# Patient Record
Sex: Male | Born: 1955 | Race: White | Hispanic: No | Smoking: Current every day smoker
Health system: Southern US, Community
[De-identification: ages and names within clinical notes are randomized; demographics above are authoritative.]

## PROBLEM LIST (undated history)

## (undated) DIAGNOSIS — Z9359 Other cystostomy status: Secondary | ICD-10-CM

## (undated) DIAGNOSIS — IMO0001 Reserved for inherently not codable concepts without codable children: Secondary | ICD-10-CM

## (undated) DIAGNOSIS — I10 Essential (primary) hypertension: Secondary | ICD-10-CM

## (undated) DIAGNOSIS — R339 Retention of urine, unspecified: Secondary | ICD-10-CM

## (undated) DIAGNOSIS — C801 Malignant (primary) neoplasm, unspecified: Secondary | ICD-10-CM

## (undated) DIAGNOSIS — Z87442 Personal history of urinary calculi: Secondary | ICD-10-CM

## (undated) DIAGNOSIS — C189 Malignant neoplasm of colon, unspecified: Secondary | ICD-10-CM

## (undated) DIAGNOSIS — C61 Malignant neoplasm of prostate: Secondary | ICD-10-CM

## (undated) DIAGNOSIS — Z9049 Acquired absence of other specified parts of digestive tract: Secondary | ICD-10-CM

## (undated) HISTORY — PX: COLON SURGERY: SHX602

## (undated) HISTORY — PX: SUPRAPUBIC CATHETER INSERTION: SUR719

## (undated) HISTORY — PX: ABDOMINAL SURGERY: SHX537

---

## 2005-10-20 ENCOUNTER — Emergency Department: Payer: Self-pay | Admitting: Emergency Medicine

## 2005-10-20 ENCOUNTER — Other Ambulatory Visit: Payer: Self-pay

## 2006-12-20 ENCOUNTER — Emergency Department: Payer: Self-pay | Admitting: Emergency Medicine

## 2007-07-22 ENCOUNTER — Emergency Department: Payer: Self-pay | Admitting: Emergency Medicine

## 2007-07-22 ENCOUNTER — Other Ambulatory Visit: Payer: Self-pay

## 2008-07-06 ENCOUNTER — Emergency Department: Payer: Self-pay | Admitting: Unknown Physician Specialty

## 2011-01-18 ENCOUNTER — Emergency Department: Payer: Self-pay | Admitting: Emergency Medicine

## 2011-09-23 ENCOUNTER — Emergency Department: Payer: Self-pay | Admitting: *Deleted

## 2011-11-04 ENCOUNTER — Emergency Department: Payer: Self-pay | Admitting: Internal Medicine

## 2011-11-04 LAB — URINALYSIS, COMPLETE
Bacteria: NONE SEEN
Bilirubin,UR: NEGATIVE
Blood: NEGATIVE
Glucose,UR: NEGATIVE mg/dL (ref 0–75)
Hyaline Cast: 3
Ketone: NEGATIVE
Leukocyte Esterase: NEGATIVE
Ph: 6 (ref 4.5–8.0)
RBC,UR: NONE SEEN /HPF (ref 0–5)
Squamous Epithelial: NONE SEEN
WBC UR: 1 /HPF (ref 0–5)

## 2012-10-08 ENCOUNTER — Emergency Department: Payer: Self-pay | Admitting: Emergency Medicine

## 2012-10-08 LAB — URINALYSIS, COMPLETE
Bacteria: NONE SEEN
Blood: NEGATIVE
Glucose,UR: NEGATIVE mg/dL (ref 0–75)
Ketone: NEGATIVE
Ph: 5 (ref 4.5–8.0)
Protein: NEGATIVE
RBC,UR: NONE SEEN /HPF (ref 0–5)
Squamous Epithelial: <1

## 2012-10-08 LAB — BASIC METABOLIC PANEL
Anion Gap: 11 (ref 7–16)
BUN: 6 mg/dL — ABNORMAL LOW (ref 7–18)
Calcium, Total: 8.9 mg/dL (ref 8.5–10.1)
Chloride: 111 mmol/L — ABNORMAL HIGH (ref 98–107)
Creatinine: 0.72 mg/dL (ref 0.60–1.30)
EGFR (African American): >60
EGFR (Non-African Amer.): >60
Osmolality: 288 (ref 275–301)
Sodium: 146 mmol/L — ABNORMAL HIGH (ref 136–145)

## 2012-10-08 LAB — TROPONIN I
Troponin-I: <0.02 ng/mL
Troponin-I: <0.02 ng/mL

## 2012-10-08 LAB — CBC
HGB: 15.9 g/dL (ref 13.0–18.0)
MCH: 35.2 pg — ABNORMAL HIGH (ref 26.0–34.0)
MCHC: 34.5 g/dL (ref 32.0–36.0)

## 2012-10-08 LAB — DRUG SCREEN, URINE
Cannabinoid 50 Ng, Ur ~~LOC~~: NEGATIVE (ref ?–50)
MDMA (Ecstasy)Ur Screen: NEGATIVE (ref ?–500)
Phencyclidine (PCP) Ur S: NEGATIVE (ref ?–25)
Tricyclic, Ur Screen: NEGATIVE (ref ?–1000)

## 2012-10-08 LAB — ETHANOL
Ethanol %: 0.259 % — ABNORMAL HIGH (ref 0.000–0.080)
Ethanol: 259 mg/dL

## 2013-04-27 ENCOUNTER — Emergency Department: Payer: Self-pay | Admitting: Emergency Medicine

## 2013-04-27 LAB — URINALYSIS, COMPLETE
Bacteria: NONE SEEN
Bilirubin,UR: NEGATIVE
Glucose,UR: NEGATIVE mg/dL (ref 0–75)
KETONE: NEGATIVE
LEUKOCYTE ESTERASE: NEGATIVE
NITRITE: NEGATIVE
Ph: 5 (ref 4.5–8.0)
Protein: NEGATIVE
SPECIFIC GRAVITY: 1.009 (ref 1.003–1.030)
Squamous Epithelial: NONE SEEN
WBC UR: 1 /HPF (ref 0–5)

## 2013-04-27 LAB — BASIC METABOLIC PANEL
ANION GAP: 12 (ref 7–16)
BUN: 18 mg/dL (ref 7–18)
Calcium, Total: 9.6 mg/dL (ref 8.5–10.1)
Chloride: 106 mmol/L (ref 98–107)
Co2: 21 mmol/L (ref 21–32)
Creatinine: 1.11 mg/dL (ref 0.60–1.30)
EGFR (African American): >60
EGFR (Non-African Amer.): >60
GLUCOSE: 104 mg/dL — AB (ref 65–99)
Osmolality: 280 (ref 275–301)
POTASSIUM: 4.1 mmol/L (ref 3.5–5.1)
Sodium: 139 mmol/L (ref 136–145)

## 2013-04-29 ENCOUNTER — Emergency Department: Payer: Self-pay | Admitting: Emergency Medicine

## 2013-04-29 LAB — URINE CULTURE

## 2013-05-18 ENCOUNTER — Ambulatory Visit: Payer: Self-pay | Admitting: Urology

## 2013-05-18 LAB — PROTIME-INR
INR: 1
PROTHROMBIN TIME: 13.1 s (ref 11.5–14.7)

## 2013-05-18 LAB — CBC WITH DIFFERENTIAL/PLATELET
BASOS PCT: 1.5 %
Basophil #: 0.1 10*3/uL (ref 0.0–0.1)
EOS ABS: 0.2 10*3/uL (ref 0.0–0.7)
EOS PCT: 3.5 %
HCT: 41.8 % (ref 40.0–52.0)
HGB: 14.2 g/dL (ref 13.0–18.0)
Lymphocyte #: 1.5 10*3/uL (ref 1.0–3.6)
Lymphocyte %: 25.5 %
MCH: 36.6 pg — AB (ref 26.0–34.0)
MCHC: 34.1 g/dL (ref 32.0–36.0)
MCV: 107 fL — ABNORMAL HIGH (ref 80–100)
Monocyte #: 0.5 x10 3/mm (ref 0.2–1.0)
Monocyte %: 8.9 %
NEUTROS ABS: 3.6 10*3/uL (ref 1.4–6.5)
NEUTROS PCT: 60.6 %
Platelet: 139 10*3/uL — ABNORMAL LOW (ref 150–440)
RBC: 3.89 10*6/uL — ABNORMAL LOW (ref 4.40–5.90)
RDW: 12.9 % (ref 11.5–14.5)
WBC: 5.9 10*3/uL (ref 3.8–10.6)

## 2013-05-18 LAB — APTT: Activated PTT: 34.3 secs (ref 23.6–35.9)

## 2013-07-14 ENCOUNTER — Ambulatory Visit: Payer: Self-pay | Admitting: Urology

## 2013-07-22 DIAGNOSIS — N138 Other obstructive and reflux uropathy: Secondary | ICD-10-CM | POA: Insufficient documentation

## 2013-07-22 DIAGNOSIS — N401 Enlarged prostate with lower urinary tract symptoms: Secondary | ICD-10-CM

## 2013-07-24 ENCOUNTER — Ambulatory Visit: Payer: Self-pay | Admitting: Urology

## 2013-07-26 ENCOUNTER — Ambulatory Visit: Payer: Self-pay | Admitting: Urology

## 2013-09-15 DIAGNOSIS — R102 Pelvic and perineal pain: Secondary | ICD-10-CM | POA: Insufficient documentation

## 2013-09-15 DIAGNOSIS — I878 Other specified disorders of veins: Secondary | ICD-10-CM | POA: Insufficient documentation

## 2013-09-15 DIAGNOSIS — R600 Localized edema: Secondary | ICD-10-CM | POA: Insufficient documentation

## 2013-11-01 ENCOUNTER — Other Ambulatory Visit: Payer: Self-pay

## 2013-11-01 LAB — COMPREHENSIVE METABOLIC PANEL
Albumin: 3.9 g/dL (ref 3.4–5.0)
Alkaline Phosphatase: 84 U/L
Anion Gap: 7 (ref 7–16)
BUN: 11 mg/dL (ref 7–18)
Bilirubin,Total: 0.6 mg/dL (ref 0.2–1.0)
CREATININE: 0.85 mg/dL (ref 0.60–1.30)
Calcium, Total: 9.6 mg/dL (ref 8.5–10.1)
Chloride: 103 mmol/L (ref 98–107)
Co2: 29 mmol/L (ref 21–32)
EGFR (African American): >60
EGFR (Non-African Amer.): >60
Glucose: 76 mg/dL (ref 65–99)
Osmolality: 276 (ref 275–301)
Potassium: 4 mmol/L (ref 3.5–5.1)
SGOT(AST): 24 U/L (ref 15–37)
SGPT (ALT): 24 U/L (ref 12–78)
Sodium: 139 mmol/L (ref 136–145)
TOTAL PROTEIN: 7.5 g/dL (ref 6.4–8.2)

## 2014-04-15 ENCOUNTER — Emergency Department: Payer: Self-pay | Admitting: Emergency Medicine

## 2014-04-15 LAB — URINALYSIS, COMPLETE
BILIRUBIN, UR: NEGATIVE
Blood: NEGATIVE
GLUCOSE, UR: NEGATIVE mg/dL (ref 0–75)
Ketone: NEGATIVE
LEUKOCYTE ESTERASE: NEGATIVE
NITRITE: NEGATIVE
PROTEIN: NEGATIVE
Ph: 6 (ref 4.5–8.0)
SPECIFIC GRAVITY: 1.002 (ref 1.003–1.030)
WBC UR: <1 /HPF (ref 0–5)

## 2014-05-10 ENCOUNTER — Ambulatory Visit: Payer: Self-pay | Admitting: Physician Assistant

## 2014-05-31 ENCOUNTER — Ambulatory Visit: Payer: Self-pay | Admitting: Hematology and Oncology

## 2014-06-05 ENCOUNTER — Ambulatory Visit: Payer: Self-pay | Admitting: Hematology and Oncology

## 2014-06-26 ENCOUNTER — Ambulatory Visit
Admit: 2014-06-26 | Disposition: A | Discharge status: Home / Self Care | Payer: Self-pay | Attending: Hematology and Oncology | Admitting: Hematology and Oncology

## 2014-08-18 NOTE — Op Note (Signed)
PATIENT NAME:  Gerald Odonnell, Gerald Odonnell MR#:  [id number] DATE OF BIRTH:  10/18/1954  DATE OF PROCEDURE:  07/26/2013  PREOPERATIVE DIAGNOSES:  1. Urinary retention. 2. Bladder outlet obstruction.   POSTOPERATIVE DIAGNOSES:  1. Urinary retention. 2. Bladder outlet obstruction.   PROCEDURE: Photoselective vaporization of the prostate with GreenLight laser.   SURGEON: [place] Urieta C. Bernardo Heater, MD  ASSISTANT: None.  ANESTHETIC: General.  INDICATIONS: A 59 year old male who presented approximately 2 months ago with urinary retention. Upon catheter placement, initially there was approximately 1900 mL of urine in the bladder. He failed a voiding trial with greater than 1 liter of urine in the bladder. He was unable to tolerate Foley catheter drainage secondary to pain and was able to perform intermittent catheterization. A suprapubic tube was subsequently placed. The urodynamic study was felt consistent with obstruction. Cystoscopy in the office showed bladder neck elevation and mild-to-moderate lateral lobe enlargement. After discussion of options, he has elected photoselective vaporization.   DESCRIPTION OF PROCEDURE: The patient was taken to the operating room and placed on the table in the supine position. A general anesthetic was administered via an LMA. He was then placed in the low lithotomy position and his external genitalia were prepped and draped in the usual fashion. A timeout was performed per protocol. A 21 French laser cystoscope was lubricated and passed under direct vision. The urethra was normal in caliber without stricture. Prostate was examined. There was moderate bladder neck elevation. There was mild lateral lobe enlargement present. The bladder mucosa was closely inspected and there were no solid or papillary lesions identified. The suprapubic tube was in good position. A GreenLight laser fiber was placed through the cystoscope. Starting at a power of 80 watts, grooves were vaporized at the 5 and  7 o'clock position starting at the bladder neck and working towards the verumontanum. The intervening median tissue was then partially vaporized and then enucleated. Hemostasis was obtained with laser coagulation. The lateral lobes were then vaporized from bladder neck to the verumontanum. At the completion of the procedure, with the scope positioned at the verumontanum, the channel was wide open. No significant bleeding was noted. The small amount of enucleated tissue was removed from the bladder via irrigation. A 20 French Foley catheter was placed with return of pink-tinged effluent upon irrigation. The catheter was placed to gravity drainage. A B&O suppository was placed per rectum.  He was taken to the PACU in stable condition. There were no complications.  ESTIMATED BLOOD LOSS: Minimal.    ____________________________ Ronda Fairly. Bernardo Heater, MD scs:lt D: 07/28/2013 18:26:28 ET T: 07/28/2013 23:39:22 ET JOB#: [id number]  cc: Nicki Reaper C. Bernardo Heater, MD, <Dictator> Abbie Sons MD ELECTRONICALLY SIGNED 08/02/2013 13:59

## 2014-09-10 DIAGNOSIS — R339 Retention of urine, unspecified: Secondary | ICD-10-CM | POA: Insufficient documentation

## 2014-09-22 ENCOUNTER — Emergency Department
Admission: EM | Admit: 2014-09-22 | Discharge: 2014-09-22 | Disposition: A | Discharge status: Home / Self Care | Payer: Self-pay | Attending: Emergency Medicine | Admitting: Emergency Medicine

## 2014-09-22 ENCOUNTER — Encounter: Payer: Self-pay | Admitting: Emergency Medicine

## 2014-09-22 DIAGNOSIS — Z9889 Other specified postprocedural states: Secondary | ICD-10-CM | POA: Insufficient documentation

## 2014-09-22 DIAGNOSIS — R338 Other retention of urine: Secondary | ICD-10-CM

## 2014-09-22 DIAGNOSIS — Z466 Encounter for fitting and adjustment of urinary device: Secondary | ICD-10-CM | POA: Insufficient documentation

## 2014-09-22 DIAGNOSIS — R1030 Lower abdominal pain, unspecified: Secondary | ICD-10-CM | POA: Insufficient documentation

## 2014-09-22 DIAGNOSIS — Z720 Tobacco use: Secondary | ICD-10-CM | POA: Insufficient documentation

## 2014-09-22 DIAGNOSIS — R339 Retention of urine, unspecified: Secondary | ICD-10-CM | POA: Insufficient documentation

## 2014-09-22 LAB — URINALYSIS COMPLETE WITH MICROSCOPIC (ARMC ONLY)
BILIRUBIN URINE: NEGATIVE
Bacteria, UA: NONE SEEN
Glucose, UA: NEGATIVE mg/dL
Hgb urine dipstick: NEGATIVE
KETONES UR: NEGATIVE mg/dL
LEUKOCYTES UA: NEGATIVE
Nitrite: NEGATIVE
Protein, ur: NEGATIVE mg/dL
Specific Gravity, Urine: 1.003 — ABNORMAL LOW (ref 1.005–1.030)
Squamous Epithelial / LPF: NONE SEEN
pH: 5 (ref 5.0–8.0)

## 2014-09-22 MED ORDER — TRAMADOL HCL 50 MG PO TABS
100.0000 mg | ORAL_TABLET | Freq: Once | ORAL | Status: AC
  Administered 2014-09-22: 100 mg via ORAL

## 2014-09-22 MED ORDER — TRAMADOL HCL 50 MG PO TABS
ORAL_TABLET | ORAL | Status: AC
  Filled 2014-09-22: qty 2

## 2014-09-22 NOTE — Discharge Instructions (Signed)
Acute Urinary Retention °Acute urinary retention is the temporary inability to urinate. °This is a common problem in older men. As men age their prostates become larger and block the flow of urine from the bladder. This is usually a problem that has come on gradually.  °HOME CARE INSTRUCTIONS °If you are sent home with a Foley catheter and a drainage system, you will need to discuss the best course of action with your health care provider. While the catheter is in, maintain a good intake of fluids. Keep the drainage bag emptied and lower than your catheter. This is so that contaminated urine will not flow back into your bladder, which could lead to a urinary tract infection. °There are two main types of drainage bags. One is a large bag that usually is used at night. It has a good capacity that will allow you to sleep through the night without having to empty it. The second type is called a leg bag. It has a smaller capacity, so it needs to be emptied more frequently. However, the main advantage is that it can be attached by a leg strap and can go underneath your clothing, allowing you the freedom to move about or leave your home. °Only take over-the-counter or prescription medicines for pain, discomfort, or fever as directed by your health care provider.  °SEEK MEDICAL CARE IF: °· You develop a low-grade fever. °· You experience spasms or leakage of urine with the spasms. °SEEK IMMEDIATE MEDICAL CARE IF:  °· You develop chills or fever. °· Your catheter stops draining urine. °· Your catheter falls out. °· You start to develop increased bleeding that does not respond to rest and increased fluid intake. °MAKE SURE YOU: °· Understand these instructions. °· Will watch your condition. °· Will get help right away if you are not doing well or get worse. °Document Released: 07/20/2000 Document Revised: 04/18/2013 Document Reviewed: 09/22/2012 °[employer]® Patient Information ©2015 [employer]. This information is not  intended to replace advice given to you by your health care provider. Make sure you discuss any questions you have with your health care provider. ° °

## 2014-09-22 NOTE — ED Provider Notes (Signed)
[facility] Emergency Department Provider Note  Time seen: 7:50 AM  I have reviewed the triage vital signs and the nursing notes.   HISTORY  Chief Complaint Urinary Retention    HPI Gerald Odonnell is a 59 y.o. male with a past medical history of BPH, status post prostate surgery 1.5 years ago, presents to the emergency department with lower abdominal pain and being unable to urinate 6 hours. According to the patient he was able to urinate some overnight but approximate 6 hours ago as a last time he was able to do so. Since that time he has had worsening pain and lower abdominal distention. Patient describes his pain as moderate/severe in severity, dull/aching in the lower abdomen. Worse with moving.Denies any fever, recent dysuria or hematuria.    History reviewed. No pertinent past medical history.  There are no active problems to display for this patient.   Past Surgical History  Procedure Laterality Date  . Abdominal surgery      No current outpatient prescriptions on file.  Allergies Review of patient's allergies indicates no known allergies.  History reviewed. No pertinent family history.  Social History History  Substance Use Topics  . Smoking status: Current Every Day Smoker -- 0.50 packs/day    Types: Cigarettes  . Smokeless tobacco: Not on file  . Alcohol Use: Not on file    Review of Systems Constitutional: Negative for fever. Cardiovascular: Negative for chest pain. Respiratory: Negative for shortness of breath. Gastrointestinal: Lower abdominal pain, nausea. Genitourinary: Negative for dysuria. Positive for urinary retention. Musculoskeletal: Negative for back pain.  10-point ROS otherwise negative.  ____________________________________________   PHYSICAL EXAM:  VITAL SIGNS: ED Triage Vitals  Enc Vitals Group     BP 09/22/14 0722 112/69 mmHg     Pulse Rate 09/22/14 0722 94     Resp 09/22/14 0722 18     Temp  09/22/14 0722 98.3 F (36.8 C)     Temp Source 09/22/14 0722 Oral     SpO2 09/22/14 0722 99 %     Weight 09/22/14 0717 248 lb (112.492 kg)     Height 09/22/14 0717 5\' 6"  (1.676 m)     Head Cir --      Peak Flow --      Pain Score 09/22/14 0717 10     Pain Loc --      Pain Edu? --      Excl. in [place]? --     Constitutional: Alert and oriented. Well appearing and in no distress. ENT   Mouth/Throat: Mucous membranes are moist. Cardiovascular: Normal rate, regular rhythm. No murmurs, rubs, or gallops. Respiratory: Normal respiratory effort without tachypnea nor retractions. Breath sounds are clear  Gastrointestinal: Soft, minimal tenderness to palpation in the suprapubic area (status post Foley insertion). No rebound, no guarding. Genitourinary: Foley catheter now in place. Musculoskeletal: Nontender with normal range of motion in all extremities.  Neurologic:  Normal speech and language. No gross focal neurologic deficits Skin:  Skin is warm, dry and intact.  Psychiatric: Mood and affect are normal. Speech and behavior are normal.  ____________________________________________   INITIAL IMPRESSION / ASSESSMENT AND PLAN / ED COURSE  Pertinent labs & imaging results that were available during my care of the patient were reviewed by me and considered in my medical decision making (see chart for details).  Patient with acute urinary retention. Foley catheter placed with 1300 cc of urine relieved. We will check a urinalysis, transition the patient to  leg bag, and have him follow-up with his urologist Dr. Bernardo Heater.  Patient just saw his urologist approximate 2 weeks ago for a follow-up visit.   Urinalysis within normal limits. We will discharge home with urology follow-up in 2 weeks. ____________________________________________   FINAL CLINICAL IMPRESSION(S) / ED DIAGNOSES  Acute urinary retention   Harvest Dark, MD 09/22/14 [phone]

## 2014-09-22 NOTE — ED Notes (Addendum)
Pt's foley catheter changed to leg bag. Demonstrated used of leg bag. Pt verbalized understanding of leg bag use.

## 2014-09-22 NOTE — ED Notes (Signed)
Last urinated approx 4-5 hours ago , pt pacing back and forth, HX enlarged prostate

## 2015-01-07 ENCOUNTER — Encounter: Payer: Self-pay | Admitting: Emergency Medicine

## 2015-01-07 DIAGNOSIS — Z720 Tobacco use: Secondary | ICD-10-CM | POA: Insufficient documentation

## 2015-01-07 DIAGNOSIS — R42 Dizziness and giddiness: Secondary | ICD-10-CM | POA: Insufficient documentation

## 2015-01-07 DIAGNOSIS — R1084 Generalized abdominal pain: Secondary | ICD-10-CM | POA: Insufficient documentation

## 2015-01-07 DIAGNOSIS — Z79899 Other long term (current) drug therapy: Secondary | ICD-10-CM | POA: Insufficient documentation

## 2015-01-07 DIAGNOSIS — R000 Tachycardia, unspecified: Secondary | ICD-10-CM | POA: Insufficient documentation

## 2015-01-07 DIAGNOSIS — R112 Nausea with vomiting, unspecified: Secondary | ICD-10-CM | POA: Insufficient documentation

## 2015-01-07 DIAGNOSIS — K5900 Constipation, unspecified: Secondary | ICD-10-CM | POA: Insufficient documentation

## 2015-01-07 DIAGNOSIS — R339 Retention of urine, unspecified: Secondary | ICD-10-CM | POA: Insufficient documentation

## 2015-01-07 NOTE — ED Notes (Signed)
Pt to triage via w/c with no distress noted; st constipation x 2 days; lower abd pain with vomiting

## 2015-01-08 ENCOUNTER — Emergency Department: Payer: Self-pay

## 2015-01-08 ENCOUNTER — Emergency Department
Admission: EM | Admit: 2015-01-08 | Discharge: 2015-01-08 | Disposition: A | Discharge status: Home / Self Care | Payer: Self-pay | Attending: Emergency Medicine | Admitting: Emergency Medicine

## 2015-01-08 ENCOUNTER — Encounter: Payer: Self-pay | Admitting: *Deleted

## 2015-01-08 DIAGNOSIS — R339 Retention of urine, unspecified: Secondary | ICD-10-CM

## 2015-01-08 DIAGNOSIS — R1084 Generalized abdominal pain: Secondary | ICD-10-CM

## 2015-01-08 DIAGNOSIS — K5900 Constipation, unspecified: Secondary | ICD-10-CM

## 2015-01-08 DIAGNOSIS — R109 Unspecified abdominal pain: Secondary | ICD-10-CM

## 2015-01-08 HISTORY — DX: Malignant (primary) neoplasm, unspecified: C80.1

## 2015-01-08 LAB — URINALYSIS COMPLETE WITH MICROSCOPIC (ARMC ONLY)
BILIRUBIN URINE: NEGATIVE
Bacteria, UA: NONE SEEN
Glucose, UA: NEGATIVE mg/dL
Ketones, ur: NEGATIVE mg/dL
Nitrite: NEGATIVE
Protein, ur: NEGATIVE mg/dL
SPECIFIC GRAVITY, URINE: 1.013 (ref 1.005–1.030)
pH: 5 (ref 5.0–8.0)

## 2015-01-08 LAB — COMPREHENSIVE METABOLIC PANEL
ALT: 25 U/L (ref 17–63)
AST: 37 U/L (ref 15–41)
Albumin: 4.3 g/dL (ref 3.5–5.0)
Alkaline Phosphatase: 84 U/L (ref 38–126)
Anion gap: 12 (ref 5–15)
BILIRUBIN TOTAL: 2.6 mg/dL — AB (ref 0.3–1.2)
BUN: 42 mg/dL — ABNORMAL HIGH (ref 6–20)
CHLORIDE: 99 mmol/L — AB (ref 101–111)
CO2: 25 mmol/L (ref 22–32)
CREATININE: 2.16 mg/dL — AB (ref 0.61–1.24)
Calcium: 9.3 mg/dL (ref 8.9–10.3)
GFR, EST AFRICAN AMERICAN: 36 mL/min — AB (ref >60)
GFR, EST NON AFRICAN AMERICAN: 31 mL/min — AB (ref >60)
Glucose, Bld: 123 mg/dL — ABNORMAL HIGH (ref 65–99)
POTASSIUM: 3.8 mmol/L (ref 3.5–5.1)
Sodium: 136 mmol/L (ref 135–145)
TOTAL PROTEIN: 7.8 g/dL (ref 6.5–8.1)

## 2015-01-08 LAB — LIPASE, BLOOD: LIPASE: 17 U/L — AB (ref 22–51)

## 2015-01-08 LAB — CBC
HCT: 49.6 % (ref 40.0–52.0)
Hemoglobin: 16.9 g/dL (ref 13.0–18.0)
MCH: 34.5 pg — ABNORMAL HIGH (ref 26.0–34.0)
MCHC: 34 g/dL (ref 32.0–36.0)
MCV: 101.6 fL — AB (ref 80.0–100.0)
PLATELETS: 144 10*3/uL — AB (ref 150–440)
RBC: 4.88 MIL/uL (ref 4.40–5.90)
RDW: 13.7 % (ref 11.5–14.5)
WBC: 26.5 10*3/uL — AB (ref 3.8–10.6)

## 2015-01-08 LAB — TROPONIN I: Troponin I: <0.03 ng/mL (ref <0.031)

## 2015-01-08 MED ORDER — OXYBUTYNIN CHLORIDE ER 10 MG PO TB24: 10.0000 mg | ORAL_TABLET | Freq: Every day | ORAL | Status: DC

## 2015-01-08 MED ORDER — MORPHINE SULFATE (PF) 4 MG/ML IV SOLN
INTRAVENOUS | Status: AC
  Filled 2015-01-08: qty 1

## 2015-01-08 MED ORDER — POLYETHYLENE GLYCOL 3350 17 G PO PACK: 17.0000 g | PACK | Freq: Two times a day (BID) | ORAL | Status: DC

## 2015-01-08 MED ORDER — SODIUM CHLORIDE 0.9 % IV BOLUS (SEPSIS)
1000.0000 mL | Freq: Once | INTRAVENOUS | Status: AC
  Administered 2015-01-08: 1000 mL via INTRAVENOUS

## 2015-01-08 MED ORDER — MORPHINE SULFATE (PF) 4 MG/ML IV SOLN
4.0000 mg | Freq: Once | INTRAVENOUS | Status: AC
  Administered 2015-01-08: 4 mg via INTRAVENOUS

## 2015-01-08 MED ORDER — IOHEXOL 240 MG/ML SOLN
25.0000 mL | INTRAMUSCULAR | Status: AC
  Administered 2015-01-08: 25 mL via ORAL

## 2015-01-08 MED ORDER — ONDANSETRON HCL 4 MG/2ML IJ SOLN
4.0000 mg | Freq: Once | INTRAMUSCULAR | Status: AC
  Administered 2015-01-08: 4 mg via INTRAVENOUS
  Filled 2015-01-08: qty 2

## 2015-01-08 MED ORDER — CEPHALEXIN 500 MG PO CAPS: 500.0000 mg | ORAL_CAPSULE | Freq: Four times a day (QID) | ORAL | Status: AC

## 2015-01-08 MED ORDER — MORPHINE SULFATE (PF) 4 MG/ML IV SOLN
4.0000 mg | Freq: Once | INTRAVENOUS | Status: AC
  Administered 2015-01-08: 4 mg via INTRAVENOUS
  Filled 2015-01-08: qty 1

## 2015-01-08 NOTE — ED Provider Notes (Signed)
[facility] Emergency Department Provider Note  ____________________________________________  Time seen: Approximately 216 AM  I have reviewed the triage vital signs and the nursing notes.   HISTORY  Chief Complaint Constipation and Abdominal Pain    HPI Gerald Odonnell is a 59 y.o. male who comes into the hospital today with abdominal pain and constipation. The patient reports he has not been to the bathroom since Saturday and he had been straining. He reports that he's been having bad pains in his rectum and his abdomen. The patient took the medicine for constipation that he does not remember the name but reports did not help. He reports the stomach feels tight. The patient has had problems with his prostate in the past in his wife feels as though his abdomen is full of fluid. The patient has not tried any enemas at home. The patient reports that the pain was intense and he decided to come in today to have it evaluated. The patient's pain is a 10 out of 10 in intensity. He also reports that he has some chest pain and shortness of breath 2 days ago with some sweats and dizziness. The patient reports that he is unable to tolerate the abdominal pain anymore so he is here for further evaluation.   Past Medical History  Diagnosis Date  . Cancer     colon    There are no active problems to display for this patient.   Past Surgical History  Procedure Laterality Date  . Abdominal surgery      Current Outpatient Rx  Name  Route  Sig  Dispense  Refill  . cephALEXin (KEFLEX) 500 MG capsule   Oral   Take 1 capsule (500 mg total) by mouth 4 (four) times daily.   20 capsule   0   . oxybutynin (DITROPAN XL) 10 MG 24 hr tablet   Oral   Take 1 tablet (10 mg total) by mouth daily.   30 tablet   2   . polyethylene glycol (MIRALAX / GLYCOLAX) packet   Oral   Take 17 g by mouth 2 (two) times daily.   14 each   0     Allergies Review of patient's allergies  indicates no known allergies.  No family history on file.  Social History Social History  Substance Use Topics  . Smoking status: Current Every Day Smoker -- 0.50 packs/day    Types: Cigarettes  . Smokeless tobacco: None  . Alcohol Use: No    Review of Systems Constitutional: No fever/chills Eyes: No visual changes. ENT: No sore throat. Cardiovascular: chest pain. Respiratory:  shortness of breath. Gastrointestinal: Abdominal pain nausea and vomiting, constipation Genitourinary: Negative for dysuria. Musculoskeletal: Negative for back pain. Skin: Negative for rash. Neurological: Dizziness  10-point ROS otherwise negative.  ____________________________________________   PHYSICAL EXAM:  VITAL SIGNS: ED Triage Vitals  Enc Vitals Group     BP 01/07/15 2352 149/82 mmHg     Pulse Rate 01/07/15 2352 127     Resp 01/07/15 2352 22     Temp 01/07/15 2352 97.7 F (36.5 C)     Temp Source 01/07/15 2352 Oral     SpO2 01/07/15 2352 95 %     Weight 01/07/15 2352 255 lb (115.667 kg)     Height 01/07/15 2352 5\' 11"  (1.803 m)     Head Cir --      Peak Flow --      Pain Score 01/07/15 2352 10  Pain Loc --      Pain Edu? --      Excl. in [place]? --     Constitutional: Alert and oriented. Well appearing and in moderate distress. Eyes: Conjunctivae are normal. PERRL. EOMI. Head: Atraumatic. Nose: No congestion/rhinnorhea. Mouth/Throat: Mucous membranes are moist.  Oropharynx non-erythematous. Cardiovascular: Tachycardic, regular rhythm. Grossly normal heart sounds.  Good peripheral circulation. Respiratory: Normal respiratory effort.  No retractions. Lungs CTAB. Gastrointestinal: Firm and distended with diffuse tenderness to palpation. Positive bowel sounds Musculoskeletal: No lower extremity tenderness nor edema.  Neurologic:  Normal speech and language.  Skin:  Skin is warm, dry and intact. Psychiatric: Mood and affect are normal.    ____________________________________________   LABS (all labs ordered are listed, but only abnormal results are displayed)  Labs Reviewed  LIPASE, BLOOD - Abnormal; Notable for the following:    Lipase 17 (*)    All other components within normal limits  COMPREHENSIVE METABOLIC PANEL - Abnormal; Notable for the following:    Chloride 99 (*)    Glucose, Bld 123 (*)    BUN 42 (*)    Creatinine, Ser 2.16 (*)    Total Bilirubin 2.6 (*)    GFR calc non Af Amer 31 (*)    GFR calc Af Amer 36 (*)    All other components within normal limits  CBC - Abnormal; Notable for the following:    WBC 26.5 (*)    MCV 101.6 (*)    MCH 34.5 (*)    Platelets 144 (*)    All other components within normal limits  URINALYSIS COMPLETEWITH MICROSCOPIC ([facility] ONLY) - Abnormal; Notable for the following:    Color, Urine YELLOW (*)    APPearance HAZY (*)    Hgb urine dipstick 3+ (*)    Leukocytes, UA TRACE (*)    Squamous Epithelial / LPF 0-5 (*)    All other components within normal limits  URINE CULTURE  TROPONIN I   ____________________________________________  EKG  ED ECG REPORT I, Loney Hering, the attending physician, personally viewed and interpreted this ECG.   Date: 01/07/2015  EKG Time: 2357  Rate: 119  Rhythm: sinus tachycardia  Axis: normal  Intervals:none  ST&T Change: none   ED ECG REPORT # 2 I, Loney Hering, the attending physician, personally viewed and interpreted this ECG.   Date: 01/08/2015  EKG Time: 231  Rate: 101  Rhythm: sinus tachycardia  Axis: normal  Intervals:none  ST&T Change: none  ____________________________________________  RADIOLOGY  KUB: Mild gaseous distention of the ascending and transverse colon, no significant bowel dilatation to suggest obstruction, small volume of colonic stool.  CT abdomen and pelvis: Markedly distended urinary bladder with mild resultant hydroureteronephrosis, there is. Vesicular inflammatory change  right greater than left correlation with urinalysis to exclude urinary tract infection, no bowel obstruction, right adrenal nodule measures 1.72.3 cm ____________________________________________   PROCEDURES  Procedure(s) performed: None  Critical Care performed: No  ____________________________________________   INITIAL IMPRESSION / ASSESSMENT AND PLAN / ED COURSE  Pertinent labs & imaging results that were available during my care of the patient were reviewed by me and considered in my medical decision making (see chart for details).  This is a 59 year old male who comes in with abdominal pain and constipation. The patient when I initially evaluated him seemed to be in a lot of discomfort and distress. His abdomen was also very distended and tight. I ordered a CT scan and was found that his  bladder was markedly distended. The patient even after the results of the CT initially was hesitant to have catheter as he reports that he is able to urinate but we convinced him of the need for a catheter and he put out a large amount of urine. Although the patient's urinalysis did not show any significant infection I will place the patient on some Keflex and have her follow-up with Dr. Noland Fordyce off in the office. I feel that the patient's white blood cell count is due to acute stress reaction. The patient's pain improved after the catheter was put in. The patient did receive some morphine and Zofran for pain as well. I will discharge the patient to home and have him follow-up with Dr. Noland Fordyce off his urologist. ____________________________________________   FINAL CLINICAL IMPRESSION(S) / ED DIAGNOSES  Final diagnoses:  Constipation, unspecified constipation type  Generalized abdominal pain  Urinary retention      Loney Hering, MD 01/08/15 [phone]

## 2015-01-08 NOTE — Discharge Instructions (Signed)
Abdominal Pain °Many things can cause abdominal pain. Usually, abdominal pain is not caused by a disease and will improve without treatment. It can often be observed and treated at home. Your health care provider will do a physical exam and possibly order blood tests and X-rays to help determine the seriousness of your pain. However, in many cases, more time must pass before a clear cause of the pain can be found. Before that point, your health care provider may not know if you need more testing or further treatment. °HOME CARE INSTRUCTIONS  °Monitor your abdominal pain for any changes. The following actions may help to alleviate any discomfort you are experiencing: °· Only take over-the-counter or prescription medicines as directed by your health care provider. °· Do not take laxatives unless directed to do so by your health care provider. °· Try a clear liquid diet (broth, tea, or water) as directed by your health care provider. Slowly move to a bland diet as tolerated. °SEEK MEDICAL CARE IF: °· You have unexplained abdominal pain. °· You have abdominal pain associated with nausea or diarrhea. °· You have pain when you urinate or have a bowel movement. °· You experience abdominal pain that wakes you in the night. °· You have abdominal pain that is worsened or improved by eating food. °· You have abdominal pain that is worsened with eating fatty foods. °· You have a fever. °SEEK IMMEDIATE MEDICAL CARE IF:  °· Your pain does not go away within 2 hours. °· You keep throwing up (vomiting). °· Your pain is felt only in portions of the abdomen, such as the right side or the left lower portion of the abdomen. °· You pass bloody or black tarry stools. °MAKE SURE YOU: °· Understand these instructions.   °· Will watch your condition.   °· Will get help right away if you are not doing well or get worse.   °Document Released: 01/21/2005 Document Revised: 04/18/2013 Document Reviewed: 12/21/2012 °[employer]® Patient Information  ©2015 [employer]. This information is not intended to replace advice given to you by your health care provider. Make sure you discuss any questions you have with your health care provider. ° °Constipation °Constipation is when a person has fewer than three bowel movements a week, has difficulty having a bowel movement, or has stools that are dry, hard, or larger than normal. As people grow older, constipation is more common. If you try to fix constipation with medicines that make you have a bowel movement (laxatives), the problem may get worse. Long-term laxative use may cause the muscles of the colon to become weak. A low-fiber diet, not taking in enough fluids, and taking certain medicines may make constipation worse.  °CAUSES  °· Certain medicines, such as antidepressants, pain medicine, iron supplements, antacids, and water pills.   °· Certain diseases, such as diabetes, irritable bowel syndrome (IBS), thyroid disease, or depression.   °· Not drinking enough water.   °· Not eating enough fiber-rich foods.   °· Stress or travel.   °· Lack of physical activity or exercise.   °· Ignoring the urge to have a bowel movement.   °· Using laxatives too much.   °SIGNS AND SYMPTOMS  °· Having fewer than three bowel movements a week.   °· Straining to have a bowel movement.   °· Having stools that are hard, dry, or larger than normal.   °· Feeling full or bloated.   °· Pain in the lower abdomen.   °· Not feeling relief after having a bowel movement.   °DIAGNOSIS  °Your health care provider will take   a medical history and perform a physical exam. Further testing may be done for severe constipation. Some tests may include:  A barium enema X-ray to examine your rectum, colon, and, sometimes, your small intestine.   A sigmoidoscopy to examine your lower colon.   A colonoscopy to examine your entire colon. TREATMENT  Treatment will depend on the severity of your constipation and what is causing it. Some dietary  treatments include drinking more fluids and eating more fiber-rich foods. Lifestyle treatments may include regular exercise. If these diet and lifestyle recommendations do not help, your health care provider may recommend taking over-the-counter laxative medicines to help you have bowel movements. Prescription medicines may be prescribed if over-the-counter medicines do not work.  HOME CARE INSTRUCTIONS   Eat foods that have a lot of fiber, such as fruits, vegetables, whole grains, and beans.  Limit foods high in fat and processed sugars, such as french fries, hamburgers, cookies, candies, and soda.   A fiber supplement may be added to your diet if you cannot get enough fiber from foods.   Drink enough fluids to keep your urine clear or pale yellow.   Exercise regularly or as directed by your health care provider.   Go to the restroom when you have the urge to go. Do not hold it.   Only take over-the-counter or prescription medicines as directed by your health care provider. Do not take other medicines for constipation without talking to your health care provider first.  [place] IF:   You have bright red blood in your stool.   Your constipation lasts for more than 4 days or gets worse.   You have abdominal or rectal pain.   You have thin, pencil-like stools.   You have unexplained weight loss. MAKE SURE YOU:   Understand these instructions.  Will watch your condition.  Will get help right away if you are not doing well or get worse. Document Released: 01/10/2004 Document Revised: 04/18/2013 Document Reviewed: 01/23/2013 [facility] Patient Information 2015 [place], [place]. This information is not intended to replace advice given to you by your health care provider. Make sure you discuss any questions you have with your health care provider.  Acute Urinary Retention Acute urinary retention is when you are unable to pee (urinate). Acute urinary retention  is common in older men. Prostates can get bigger, which blocks the flow of pee.  HOME CARE  Drink enough fluids to keep your pee clear or pale yellow.  If you are sent home with a tube that drains the bladder (catheter), there will be a drainage bag attached to it. There are two types of bags. One is big that you can wear at night without having to empty it. One is smaller and needs to be emptied more often.  Keep the drainage bag empty.  Keep the drainage bag lower than your catheter.  Only take medicine as told by your doctor. GET HELP IF:  You have a low-grade fever.  You have spasms or you are leaking pee when you have spasms. GET HELP RIGHT AWAY IF:   You have chills or a fever.  Your catheter stops draining pee.  Your catheter falls out.  You have increased bleeding that does not stop after you have rested and increased the amount of fluids you had been drinking. MAKE SURE YOU:   Understand these instructions.  Will watch your condition.  Will get help right away if you are not doing well or get  worse. Document Released: 09/30/2007 Document Revised: 02/01/2013 Document Reviewed: 09/22/2012 [facility] Patient Information 2015 [place], [place]. This information is not intended to replace advice given to you by your health care provider. Make sure you discuss any questions you have with your health care provider.

## 2015-01-08 NOTE — ED Notes (Signed)
Pt provided leg drainage bag, D/C instructions and Rx given. Pt with no additional questions at this time, accompanied by family, ambulatory and in no pain.

## 2015-01-08 NOTE — ED Notes (Signed)
Patient reports feeling better following decompression of bladder following foley placement. Patient drained over 2.5L of urine. MD aware. No verbalized needs at this time. Will continue to monitor.

## 2015-01-09 LAB — URINE CULTURE
Culture: NO GROWTH
Special Requests: NORMAL

## 2015-01-17 ENCOUNTER — Encounter: Payer: Self-pay | Admitting: *Deleted

## 2015-01-17 ENCOUNTER — Emergency Department
Admission: EM | Admit: 2015-01-17 | Discharge: 2015-01-17 | Disposition: A | Discharge status: Home / Self Care | Payer: Self-pay | Attending: Emergency Medicine | Admitting: Emergency Medicine

## 2015-01-17 DIAGNOSIS — Z79899 Other long term (current) drug therapy: Secondary | ICD-10-CM | POA: Insufficient documentation

## 2015-01-17 DIAGNOSIS — R339 Retention of urine, unspecified: Secondary | ICD-10-CM | POA: Insufficient documentation

## 2015-01-17 DIAGNOSIS — Z720 Tobacco use: Secondary | ICD-10-CM | POA: Insufficient documentation

## 2015-01-17 HISTORY — DX: Retention of urine, unspecified: R33.9

## 2015-01-17 LAB — URINALYSIS COMPLETE WITH MICROSCOPIC (ARMC ONLY)
BILIRUBIN URINE: NEGATIVE
GLUCOSE, UA: NEGATIVE mg/dL
KETONES UR: NEGATIVE mg/dL
NITRITE: NEGATIVE
PH: 6 (ref 5.0–8.0)
Protein, ur: 30 mg/dL — AB
Specific Gravity, Urine: 1.006 (ref 1.005–1.030)
Squamous Epithelial / LPF: NONE SEEN

## 2015-01-17 MED ORDER — OXYCODONE-ACETAMINOPHEN 5-325 MG PO TABS
2.0000 | ORAL_TABLET | Freq: Once | ORAL | Status: AC
  Administered 2015-01-17: 2 via ORAL
  Filled 2015-01-17: qty 2

## 2015-01-17 MED ORDER — LIDOCAINE HCL 2 % EX GEL
CUTANEOUS | Status: AC
  Filled 2015-01-17: qty 10

## 2015-01-17 MED ORDER — LIDOCAINE HCL 2 % EX GEL
1.0000 "application " | Freq: Once | CUTANEOUS | Status: AC
  Administered 2015-01-17: 1 via URETHRAL

## 2015-01-17 NOTE — ED Provider Notes (Signed)
[facility] Emergency Department Provider Note    ____________________________________________  Time seen: 1800  I have reviewed the triage vital signs and the nursing notes.   HISTORY  Chief Complaint Urinary Retention   History limited by: Not Limited   HPI Gerald Odonnell is a 59 y.o. male who presents to the emergency department today because of lower abdominal pain and concerns for urinary retention. The patient states that he had his urinary catheter removed by his urologist earlier today. He had had it placed for 12 days. Patient states he has a history of prostatic enlargement and has now had to have multiple urinary catheterizations placed secondary to urinary retention. She states he has had procedures to try to strep his prostate. The patient denies any fevers.   Past Medical History  Diagnosis Date  . Cancer     colon  . Urinary retention     There are no active problems to display for this patient.   Past Surgical History  Procedure Laterality Date  . Abdominal surgery      Current Outpatient Rx  Name  Route  Sig  Dispense  Refill  . oxybutynin (DITROPAN XL) 10 MG 24 hr tablet   Oral   Take 1 tablet (10 mg total) by mouth daily.   30 tablet   2   . polyethylene glycol (MIRALAX / GLYCOLAX) packet   Oral   Take 17 g by mouth 2 (two) times daily.   14 each   0     Allergies Review of patient's allergies indicates no known allergies.  No family history on file.  Social History Social History  Substance Use Topics  . Smoking status: Current Every Day Smoker -- 0.50 packs/day    Types: Cigarettes  . Smokeless tobacco: None  . Alcohol Use: No    Review of Systems  Constitutional: Negative for fever. Cardiovascular: Negative for chest pain. Respiratory: Negative for shortness of breath. Gastrointestinal: Positive for lower abdominal pain Genitourinary: Positive for urinary retention Musculoskeletal: Negative for  back pain. Skin: Negative for rash. Neurological: Negative for headaches, focal weakness or numbness.  10-point ROS otherwise negative.  ____________________________________________   PHYSICAL EXAM:  VITAL SIGNS: ED Triage Vitals  Enc Vitals Group     BP 01/17/15 1614 144/96 mmHg     Pulse Rate 01/17/15 1614 109     Resp 01/17/15 1614 22     Temp 01/17/15 1614 98.9 F (37.2 C)     Temp Source 01/17/15 1614 Oral     SpO2 01/17/15 1614 96 %     Weight 01/17/15 1614 245 lb (111.131 kg)     Height --      Head Cir --      Peak Flow --      Pain Score 01/17/15 1617 10   Constitutional: Alert and oriented. Well appearing and in no distress. Eyes: Conjunctivae are normal. PERRL. Normal extraocular movements. ENT   Head: Normocephalic and atraumatic.   Nose: No congestion/rhinnorhea.   Mouth/Throat: Mucous membranes are moist.   Neck: No stridor. Hematological/Lymphatic/Immunilogical: No cervical lymphadenopathy. Cardiovascular: Normal rate, regular rhythm.  No murmurs, rubs, or gallops. Respiratory: Normal respiratory effort without tachypnea nor retractions. Breath sounds are clear and equal bilaterally. No wheezes/rales/rhonchi. Gastrointestinal: Soft and minimally tender to palpation in the lower extremity. Genitourinary: Deferred Musculoskeletal: Normal range of motion in all extremities. No joint effusions.  No lower extremity tenderness nor edema. Neurologic:  Normal speech and language. No gross focal  neurologic deficits are appreciated. Speech is normal.  Skin:  Skin is warm, dry and intact. No rash noted. Psychiatric: Mood and affect are normal. Speech and behavior are normal. Patient exhibits appropriate insight and judgment.  ____________________________________________    LABS (pertinent positives/negatives)  UCx pending at time of  discharge  ____________________________________________   EKG  None  ____________________________________________    RADIOLOGY  None  ____________________________________________   PROCEDURES  Procedure(s) performed: None  Critical Care performed: No  ____________________________________________   INITIAL IMPRESSION / ASSESSMENT AND PLAN / ED COURSE  Pertinent labs & imaging results that were available during my care of the patient were reviewed by me and considered in my medical decision making (see chart for details).  Patient presented to the emergency department today with concerns for urinary retention lower abdominal pain. The patient did have a Foley catheter placed here. The patient's pain did improve. Almost 2 L of urine were removed. Discussed with patient and he wanted to keep the Foley catheter in until he can follow up with his urologist.  ____________________________________________   FINAL CLINICAL IMPRESSION(S) / ED DIAGNOSES  Final diagnoses:  Urinary retention     Nance Pear, MD 01/17/15 1902

## 2015-01-17 NOTE — Discharge Instructions (Signed)
Please seek medical attention for any high fevers, chest pain, shortness of breath, change in behavior, persistent vomiting, bloody stool or any other new or concerning symptoms.   Acute Urinary Retention Acute urinary retention is when you are unable to pee (urinate). Acute urinary retention is common in older men. Prostates can get bigger, which blocks the flow of pee.  HOME CARE  Drink enough fluids to keep your pee clear or pale yellow.  If you are sent home with a tube that drains the bladder (catheter), there will be a drainage bag attached to it. There are two types of bags. One is big that you can wear at night without having to empty it. One is smaller and needs to be emptied more often.  Keep the drainage bag empty.  Keep the drainage bag lower than your catheter.  Only take medicine as told by your doctor. GET HELP IF:  You have a low-grade fever.  You have spasms or you are leaking pee when you have spasms. GET HELP RIGHT AWAY IF:   You have chills or a fever.  Your catheter stops draining pee.  Your catheter falls out.  You have increased bleeding that does not stop after you have rested and increased the amount of fluids you had been drinking. MAKE SURE YOU:   Understand these instructions.  Will watch your condition.  Will get help right away if you are not doing well or get worse. Document Released: 09/30/2007 Document Revised: 02/01/2013 Document Reviewed: 09/22/2012 [facility] Patient Information 2015 [place], [place]. This information is not intended to replace advice given to you by your health care provider. Make sure you discuss any questions you have with your health care provider.

## 2015-01-17 NOTE — ED Notes (Signed)
Patient states he had a Foley catheter in place for 12 days and was removed this morning by his urologist. Patient states he only voided a cupful today. Patient c/o abdominal spasms. Patient is unable to sit on stretcher due to pain.

## 2015-01-19 LAB — URINE CULTURE

## 2015-01-23 ENCOUNTER — Other Ambulatory Visit: Payer: Self-pay | Admitting: *Deleted

## 2015-01-23 ENCOUNTER — Other Ambulatory Visit: Payer: Self-pay | Admitting: Urology

## 2015-01-23 DIAGNOSIS — R339 Retention of urine, unspecified: Secondary | ICD-10-CM

## 2015-01-30 ENCOUNTER — Other Ambulatory Visit: Payer: Self-pay | Admitting: Interventional Radiology

## 2015-01-30 ENCOUNTER — Ambulatory Visit
Admission: RE | Admit: 2015-01-30 | Discharge: 2015-01-30 | Disposition: A | Discharge status: Home / Self Care | Payer: Self-pay | Source: Ambulatory Visit | Attending: Urology | Admitting: Urology

## 2015-01-30 ENCOUNTER — Other Ambulatory Visit (HOSPITAL_COMMUNITY): Payer: Self-pay | Admitting: Interventional Radiology

## 2015-01-30 ENCOUNTER — Ambulatory Visit
Admission: RE | Admit: 2015-01-30 | Discharge: 2015-01-30 | Disposition: A | Discharge status: Home / Self Care | Payer: Self-pay | Source: Ambulatory Visit | Attending: Interventional Radiology | Admitting: Interventional Radiology

## 2015-01-30 ENCOUNTER — Other Ambulatory Visit: Payer: Self-pay | Admitting: Urology

## 2015-01-30 DIAGNOSIS — R339 Retention of urine, unspecified: Secondary | ICD-10-CM

## 2015-01-30 HISTORY — DX: Reserved for inherently not codable concepts without codable children: IMO0001

## 2015-01-30 HISTORY — DX: Acquired absence of other specified parts of digestive tract: Z90.49

## 2015-01-30 LAB — CBC
HEMATOCRIT: 48.3 % (ref 40.0–52.0)
HEMOGLOBIN: 16.4 g/dL (ref 13.0–18.0)
MCH: 34.2 pg — ABNORMAL HIGH (ref 26.0–34.0)
MCHC: 34 g/dL (ref 32.0–36.0)
MCV: 100.6 fL — AB (ref 80.0–100.0)
Platelets: 138 10*3/uL — ABNORMAL LOW (ref 150–440)
RBC: 4.8 MIL/uL (ref 4.40–5.90)
RDW: 13.1 % (ref 11.5–14.5)
WBC: 6.6 10*3/uL (ref 3.8–10.6)

## 2015-01-30 LAB — APTT: APTT: 35 s (ref 24–36)

## 2015-01-30 LAB — PROTIME-INR
INR: 0.96
Prothrombin Time: 13 seconds (ref 11.4–15.0)

## 2015-01-30 MED ORDER — CEFAZOLIN SODIUM 1-5 GM-% IV SOLN
1.0000 g | Freq: Once | INTRAVENOUS | Status: AC
  Administered 2015-01-30: 1 g via INTRAVENOUS

## 2015-01-30 MED ORDER — SODIUM CHLORIDE 0.9 % IV SOLN
INTRAVENOUS | Status: DC
  Administered 2015-01-30: 09:00:00 via INTRAVENOUS

## 2015-01-30 MED ORDER — MIDAZOLAM HCL 2 MG/2ML IJ SOLN
INTRAMUSCULAR | Status: AC | PRN
  Administered 2015-01-30 (×3): 1 mg via INTRAVENOUS

## 2015-01-30 MED ORDER — FENTANYL CITRATE (PF) 100 MCG/2ML IJ SOLN
INTRAMUSCULAR | Status: AC | PRN
  Administered 2015-01-30 (×4): 50 ug via INTRAVENOUS

## 2015-01-30 NOTE — OR Nursing (Signed)
Dr Earleen Newport reported no flushes needed at home. Pt and family informed. Patient given dressing suplies

## 2015-01-30 NOTE — Procedures (Signed)
Interventional Radiology Procedure Note  Procedure: Image guided placemen of suprapubic catheter for urinary retention.  6F catheter placed, to gravity leg bag.  Clear urine returned.  Complications: None Recommendations:  - Ok to shower tomorrow - Do not submerge for 7 days - retention suture may be removed in 2 weeks - Routine care   Signed,  Dulcy Fanny. Earleen Newport, DO

## 2015-01-30 NOTE — Discharge Instructions (Signed)
Suprapubic Catheter Replacement  A suprapubic catheter is a rubber tube with a tiny balloon on the end. It is used to drain urine from the bladder. This catheter is put in your bladder through a small opening in the lower center part of your abdomen. Suprapubic refers to the area right above your pubic bone.  The balloon on the end of the catheter is filled with germ-free (sterile) water. This keeps the catheter from slipping out. When the catheter is in place, your urine will drain into a collection bag. The bag can be put beside your bed at night or attached to your leg during the day. Sometimes, a caregiver will change your suprapubic catheter. Other times, you may need to change it yourself. This may be the case if you need to wear a catheter for a long time. Usually, they need to be changed every 4 to 6 weeks. Ask your caregiver how often yours should be changed.  BEFORE THE PROCEDURE  Your caregiver will help you order the following supplies you will need:  · Sterile gloves.  · Catheters.  · Syringes.  · Sterile water.  · Sterile cleaning solution.  · Lubricant.  · Drainage bags.  PROCEDURE   To change your catheter:  · Drink plenty of fluids before changing the catheter.  · Wash your hands with soap and water.  · Lie on your back and put on sterile gloves.  · Clean the skin around the catheter opening. Use the sterile cleaning solution.  · Use a syringe to get the water out of the balloon from the old catheter.  · Slowly remove the catheter.  · Take off the first pair of gloves, and put on a new pair. Then put lubricant on the tip of the new catheter. Put the new catheter through the opening.  · Wait for some urine to start flowing. Then, use the other syringe to fill the balloon with sterile water.  · Attach the catheter to your drainage bag. Make sure the connection is tight.  Important warnings  · The catheter should come out easily. If it seems stuck, do not pull it.  · Call your caregiver right away if  you have any trouble while changing the catheter.  · When the old catheter is removed, the new one should be put in right away. This is because the opening will close quickly. If you have a problem, go to an emergency clinic right away.  AFTER THE PROCEDURE   · Understand how to care for the catheter site and change any bandages.  · Understand how to use and clean the drainage bags.     This information is not intended to replace advice given to you by your health care provider. Make sure you discuss any questions you have with your health care provider.     Document Released: 01/06/2001 Document Revised: 07/06/2011 Document Reviewed: 11/28/2010  Elsevier Interactive Patient Education ©2016 [employer]

## 2015-01-30 NOTE — OR Nursing (Signed)
Call to Dr Bernardo Heater office to see if he wants foley removed prior to discharge. Patient insisting on removal.

## 2015-02-26 ENCOUNTER — Other Ambulatory Visit: Payer: Self-pay | Admitting: Urology

## 2015-02-26 DIAGNOSIS — R339 Retention of urine, unspecified: Secondary | ICD-10-CM

## 2015-03-08 ENCOUNTER — Ambulatory Visit
Admission: RE | Admit: 2015-03-08 | Discharge: 2015-03-08 | Disposition: A | Discharge status: Home / Self Care | Payer: Self-pay | Source: Ambulatory Visit | Attending: Urology | Admitting: Urology

## 2015-03-08 DIAGNOSIS — Z9049 Acquired absence of other specified parts of digestive tract: Secondary | ICD-10-CM | POA: Insufficient documentation

## 2015-03-08 DIAGNOSIS — F17210 Nicotine dependence, cigarettes, uncomplicated: Secondary | ICD-10-CM | POA: Insufficient documentation

## 2015-03-08 DIAGNOSIS — R339 Retention of urine, unspecified: Secondary | ICD-10-CM | POA: Insufficient documentation

## 2015-03-08 DIAGNOSIS — Z4682 Encounter for fitting and adjustment of non-vascular catheter: Secondary | ICD-10-CM | POA: Insufficient documentation

## 2015-03-08 DIAGNOSIS — Z9889 Other specified postprocedural states: Secondary | ICD-10-CM | POA: Insufficient documentation

## 2015-03-08 DIAGNOSIS — Z85038 Personal history of other malignant neoplasm of large intestine: Secondary | ICD-10-CM | POA: Insufficient documentation

## 2015-03-08 MED ORDER — KETOROLAC TROMETHAMINE 30 MG/ML IJ SOLN
30.0000 mg | Freq: Once | INTRAMUSCULAR | Status: AC
  Administered 2015-03-08: 30 mg via INTRAVENOUS
  Filled 2015-03-08 (×2): qty 1

## 2015-03-08 MED ORDER — FENTANYL CITRATE (PF) 100 MCG/2ML IJ SOLN
INTRAMUSCULAR | Status: AC | PRN
  Administered 2015-03-08 (×2): 50 ug via INTRAVENOUS

## 2015-03-08 MED ORDER — IOHEXOL 300 MG/ML  SOLN
30.0000 mL | Freq: Once | INTRAMUSCULAR | Status: DC | PRN
  Administered 2015-03-08: 15 mL
  Filled 2015-03-08: qty 30

## 2015-03-08 NOTE — OR Nursing (Signed)
Discussed with Dr Earleen Newport no labs ordered, no new order for labs, md requested ns lock in event pt needs pain med intraprocedure but no IV fluids at this time.

## 2015-03-08 NOTE — Procedures (Signed)
Interventional Radiology Procedure Note  Procedure: Fluoro guided exchange of 29F supra-pubic catheter for 8F pigtail supra-pubic catheter. Findings: Tract not formed well enough for placement of a foley catheter. Modified seldinger technique exchange for pigtail catheter   Complications: None Recommendations:  - Ok to shower tomorrow - Do not submerge  - Routine care - to leg bag - Appointment with [facility] in 1 month   Signed,  Dulcy Fanny. Earleen Newport, DO

## 2015-03-08 NOTE — Discharge Instructions (Signed)
Suprapubic Catheter Replacement  A suprapubic catheter is a rubber tube with a tiny balloon on the end. It is used to drain urine from the bladder. This catheter is put in your bladder through a small opening in the lower center part of your abdomen. Suprapubic refers to the area right above your pubic bone.  The balloon on the end of the catheter is filled with germ-free (sterile) water. This keeps the catheter from slipping out. When the catheter is in place, your urine will drain into a collection bag. The bag can be put beside your bed at night or attached to your leg during the day. Sometimes, a caregiver will change your suprapubic catheter. Other times, you may need to change it yourself. This may be the case if you need to wear a catheter for a long time. Usually, they need to be changed every 4 to 6 weeks. Ask your caregiver how often yours should be changed.  BEFORE THE PROCEDURE  Your caregiver will help you order the following supplies you will need:  · Sterile gloves.  · Catheters.  · Syringes.  · Sterile water.  · Sterile cleaning solution.  · Lubricant.  · Drainage bags.  PROCEDURE   To change your catheter:  · Drink plenty of fluids before changing the catheter.  · Wash your hands with soap and water.  · Lie on your back and put on sterile gloves.  · Clean the skin around the catheter opening. Use the sterile cleaning solution.  · Use a syringe to get the water out of the balloon from the old catheter.  · Slowly remove the catheter.  · Take off the first pair of gloves, and put on a new pair. Then put lubricant on the tip of the new catheter. Put the new catheter through the opening.  · Wait for some urine to start flowing. Then, use the other syringe to fill the balloon with sterile water.  · Attach the catheter to your drainage bag. Make sure the connection is tight.  Important warnings  · The catheter should come out easily. If it seems stuck, do not pull it.  · Call your caregiver right away if  you have any trouble while changing the catheter.  · When the old catheter is removed, the new one should be put in right away. This is because the opening will close quickly. If you have a problem, go to an emergency clinic right away.  AFTER THE PROCEDURE   · Understand how to care for the catheter site and change any bandages.  · Understand how to use and clean the drainage bags.     This information is not intended to replace advice given to you by your health care provider. Make sure you discuss any questions you have with your health care provider.     Document Released: 01/06/2001 Document Revised: 07/06/2011 Document Reviewed: 11/28/2010  Elsevier Interactive Patient Education ©2016 [employer]

## 2015-03-08 NOTE — H&P (Signed)
Chief Complaint: Difficulty urinating.   Referring Physician(s): Stoioff,Scott C  History of Present Illness: Gerald Odonnell is a 59 y.o. male with a history of Lower Urinary Tract Symptoms.  He has had a CT guided supra-pubic catheter placed 01/30/2015.    He returns today for upsizing of the catheter, and possible placement of a foley catheter.   He denies any recent fevers, rigors, or chills.  He has not had any recent hospitalizations.      Past Medical History  Diagnosis Date  . Urinary retention   . Shortness of breath dyspnea   . Cancer ([place])     colon  . S/P colon resection     Past Surgical History  Procedure Laterality Date  . Abdominal surgery    . Suprapubic catheter insertion      Allergies: Review of patient's allergies indicates no known allergies.  Medications: Prior to Admission medications   Medication Sig Start Date End Date Taking? Authorizing Provider  hydrochlorothiazide (MICROZIDE) 12.5 MG capsule Take 12.5 mg by mouth daily.   Yes Historical Provider, MD  HYDROcodone-acetaminophen (NORCO/VICODIN) 5-325 MG tablet Take 1 tablet by mouth 2 (two) times daily as needed for moderate pain.   Yes Historical Provider, MD  sulfamethoxazole-trimethoprim (BACTRIM DS,SEPTRA DS) 800-160 MG tablet Take 1 tablet by mouth 2 (two) times daily.   Yes Historical Provider, MD  tamsulosin (FLOMAX) 0.4 MG CAPS capsule Take 0.4 mg by mouth.   Yes Historical Provider, MD  oxybutynin (DITROPAN XL) 10 MG 24 hr tablet Take 1 tablet (10 mg total) by mouth daily. 01/08/15 01/08/16  Loney Hering, MD  polyethylene glycol [facility] / Floria Raveling) packet Take 17 g by mouth 2 (two) times daily. 01/08/15   Loney Hering, MD     History reviewed. No pertinent family history.  Social History   Social History  . Marital Status: Married    Spouse Name: N/A  . Number of Children: N/A  . Years of Education: N/A   Social History Main Topics  . Smoking status: Current Every  Day Smoker -- 0.50 packs/day    Types: Cigarettes  . Smokeless tobacco: None  . Alcohol Use: No  . Drug Use: None  . Sexual Activity: Not Asked   Other Topics Concern  . None   Social History Narrative      Review of Systems: A 12 point ROS discussed and pertinent positives are indicated in the HPI above.  All other systems are negative.  Review of Systems  Vital Signs: BP 115/80 mmHg  Pulse 81  Temp(Src) 98.1 F (36.7 C) (Oral)  Resp 18  Ht 5\' 11"  (1.803 m)  Wt 245 lb (111.131 kg)  BMI 34.19 kg/m2  SpO2 94%  Physical Exam Targeted exam of the supra-pubic site shows healing stoma.    CTA bilateral.  RRR, - 3rd heart sounds.    Mallampati Score:  3    Imaging: Ir Catheter Tube Change  03/08/2015  CLINICAL DATA:  59 year old male with a history of lower urinary tract symptoms. He has required suprapubic catheter placement, with most recent 01/30/2015 via CT guidance. He presents today for up sizing of the catheter. EXAM: IR CATHETER TUBE CHANGE ANESTHESIA/SEDATION: No moderate sedation. MEDICATIONS: 100 mcg fentanyl CONTRAST:  30mL OMNIPAQUE IOHEXOL 300 MG/ML  SOLN PROCEDURE: The procedure, risks, benefits, and alternatives were explained to the patient. Questions regarding the procedure were encouraged and answered. The patient understands and consents to the procedure. Patient was positioned supine  position on the IR table. The suprapubic region was prepped and draped in the usual sterile fashion including the indwelling catheter. Retention suture of the indwelling tube was ligated. Contrast injected through the indwelling tube confirmed position within the urinary bladder. The catheter was ligated and withdrawn from the tract. Inability to pass a 16 French Foley catheter through the tract, and the say Britta Mccreedy wire was placed through the tract into the urinary bladder under fluoroscopy. The tract was not performed well enough to except a modified 16 French Foley catheter  over the wire, and os a 16 French pigtail catheter was passed over the Bentson wire into the urinary bladder. The stiffener and the wire were removed, the pigtail was formed, and contrast confirmed position within the urinary bladder. Retention suture was placed after generous infiltration of the skin and subcutaneous tissues with 1% lidocaine for local anesthesia. Sterile dressing was placed. Patient tolerated the procedure well and remained hemodynamically stable throughout. No complications were encountered and no significant blood loss encountered. COMPLICATIONS: None FINDINGS: Indwelling 14 French catheter within the urinary bladder. Up sizing was performed to a 16 French pigtail catheter. Single Prolene retention suture was placed. IMPRESSION: Status post exchange of 14 French pigtail catheter for a 16 French pigtail catheter. Signed, Dulcy Fanny. Earleen Newport, DO [facility] Electronically Signed   By: Corrie Mckusick D.O.   On: 03/08/2015 10:16    Labs:  CBC:  Recent Labs  01/07/15 2357 01/30/15 0827  WBC 26.5* 6.6  HGB 16.9 16.4  HCT 49.6 48.3  PLT 144* 138*    COAGS:  Recent Labs  01/30/15 0827  INR 0.96  APTT 35    BMP:  Recent Labs  01/07/15 2357  NA 136  K 3.8  CL 99*  CO2 25  GLUCOSE 123*  BUN 42*  CALCIUM 9.3  CREATININE 2.16*  GFRNONAA 31*  GFRAA 36*    LIVER FUNCTION TESTS:  Recent Labs  01/07/15 2357  BILITOT 2.6*  AST 37  ALT 25  ALKPHOS 84  PROT 7.8  ALBUMIN 4.3    TUMOR MARKERS: No results for input(s): AFPTM, CEA, CA199, CHROMGRNA in the last 8760 hours.  Assessment and Plan:  59 year old male with LUTS, presents for supra-pubic catheter exchange.   Thank you for this interesting consult.  I greatly enjoyed meeting [name] and look forward to participating in their care.  A copy of this report was sent to the requesting provider on this date.  SignedCorrie Mckusick 03/08/2015, 10:28 AM

## 2015-04-23 ENCOUNTER — Inpatient Hospital Stay
Admission: EM | Admit: 2015-04-23 | Discharge: 2015-04-29 | DRG: 372 | Disposition: A | Discharge status: Home / Self Care | Payer: Self-pay | Attending: Internal Medicine | Admitting: Internal Medicine

## 2015-04-23 ENCOUNTER — Emergency Department: Payer: Self-pay

## 2015-04-23 ENCOUNTER — Encounter: Payer: Self-pay | Admitting: Emergency Medicine

## 2015-04-23 DIAGNOSIS — A09 Infectious gastroenteritis and colitis, unspecified: Secondary | ICD-10-CM | POA: Diagnosis present

## 2015-04-23 DIAGNOSIS — I10 Essential (primary) hypertension: Secondary | ICD-10-CM | POA: Diagnosis present

## 2015-04-23 DIAGNOSIS — T83511A Infection and inflammatory reaction due to indwelling urethral catheter, initial encounter: Secondary | ICD-10-CM | POA: Diagnosis present

## 2015-04-23 DIAGNOSIS — N401 Enlarged prostate with lower urinary tract symptoms: Secondary | ICD-10-CM | POA: Diagnosis present

## 2015-04-23 DIAGNOSIS — Z85038 Personal history of other malignant neoplasm of large intestine: Secondary | ICD-10-CM

## 2015-04-23 DIAGNOSIS — Z888 Allergy status to other drugs, medicaments and biological substances status: Secondary | ICD-10-CM

## 2015-04-23 DIAGNOSIS — B961 Klebsiella pneumoniae [K. pneumoniae] as the cause of diseases classified elsewhere: Secondary | ICD-10-CM | POA: Diagnosis present

## 2015-04-23 DIAGNOSIS — E860 Dehydration: Secondary | ICD-10-CM | POA: Diagnosis present

## 2015-04-23 DIAGNOSIS — Z79899 Other long term (current) drug therapy: Secondary | ICD-10-CM

## 2015-04-23 DIAGNOSIS — N390 Urinary tract infection, site not specified: Secondary | ICD-10-CM | POA: Diagnosis present

## 2015-04-23 DIAGNOSIS — Y846 Urinary catheterization as the cause of abnormal reaction of the patient, or of later complication, without mention of misadventure at the time of the procedure: Secondary | ICD-10-CM | POA: Diagnosis present

## 2015-04-23 DIAGNOSIS — F17210 Nicotine dependence, cigarettes, uncomplicated: Secondary | ICD-10-CM | POA: Diagnosis present

## 2015-04-23 DIAGNOSIS — A0472 Enterocolitis due to Clostridium difficile, not specified as recurrent: Secondary | ICD-10-CM

## 2015-04-23 DIAGNOSIS — R109 Unspecified abdominal pain: Secondary | ICD-10-CM

## 2015-04-23 DIAGNOSIS — A047 Enterocolitis due to Clostridium difficile: Principal | ICD-10-CM | POA: Diagnosis present

## 2015-04-23 DIAGNOSIS — K3580 Unspecified acute appendicitis: Secondary | ICD-10-CM | POA: Diagnosis present

## 2015-04-23 HISTORY — DX: Essential (primary) hypertension: I10

## 2015-04-23 LAB — COMPREHENSIVE METABOLIC PANEL
ALK PHOS: 59 U/L (ref 38–126)
ALT: 13 U/L — AB (ref 17–63)
AST: 18 U/L (ref 15–41)
Albumin: 3.6 g/dL (ref 3.5–5.0)
Anion gap: 9 (ref 5–15)
BILIRUBIN TOTAL: 1.4 mg/dL — AB (ref 0.3–1.2)
BUN: 13 mg/dL (ref 6–20)
CALCIUM: 8.9 mg/dL (ref 8.9–10.3)
CHLORIDE: 101 mmol/L (ref 101–111)
CO2: 28 mmol/L (ref 22–32)
CREATININE: 0.87 mg/dL (ref 0.61–1.24)
Glucose, Bld: 82 mg/dL (ref 65–99)
Potassium: 4 mmol/L (ref 3.5–5.1)
Sodium: 138 mmol/L (ref 135–145)
TOTAL PROTEIN: 6.8 g/dL (ref 6.5–8.1)

## 2015-04-23 LAB — GASTROINTESTINAL PANEL BY PCR, STOOL (REPLACES STOOL CULTURE)
ASTROVIRUS: NOT DETECTED
Adenovirus F40/41: NOT DETECTED
CAMPYLOBACTER SPECIES: NOT DETECTED
Cryptosporidium: NOT DETECTED
Cyclospora cayetanensis: NOT DETECTED
E. COLI O157: NOT DETECTED
ENTEROAGGREGATIVE E COLI (EAEC): NOT DETECTED
ENTEROTOXIGENIC E COLI (ETEC): NOT DETECTED
Entamoeba histolytica: NOT DETECTED
Enteropathogenic E coli (EPEC): NOT DETECTED
Giardia lamblia: NOT DETECTED
NOROVIRUS GI/GII: NOT DETECTED
PLESIMONAS SHIGELLOIDES: NOT DETECTED
Rotavirus A: NOT DETECTED
SALMONELLA SPECIES: NOT DETECTED
SAPOVIRUS (I, II, IV, AND V): NOT DETECTED
SHIGELLA/ENTEROINVASIVE E COLI (EIEC): NOT DETECTED
Shiga like toxin producing E coli (STEC): NOT DETECTED
VIBRIO CHOLERAE: NOT DETECTED
Vibrio species: NOT DETECTED
Yersinia enterocolitica: NOT DETECTED

## 2015-04-23 LAB — CBC WITH DIFFERENTIAL/PLATELET
Basophils Absolute: 0.1 10*3/uL (ref 0–0.1)
Basophils Relative: 1 %
EOS PCT: 0 %
Eosinophils Absolute: 0 10*3/uL (ref 0–0.7)
HEMATOCRIT: 49 % (ref 40.0–52.0)
Hemoglobin: 16.6 g/dL (ref 13.0–18.0)
LYMPHS ABS: 2 10*3/uL (ref 1.0–3.6)
LYMPHS PCT: 11 %
MCH: 33.2 pg (ref 26.0–34.0)
MCHC: 33.9 g/dL (ref 32.0–36.0)
MCV: 97.8 fL (ref 80.0–100.0)
MONO ABS: 2 10*3/uL — AB (ref 0.2–1.0)
Monocytes Relative: 11 %
NEUTROS PCT: 77 %
Neutro Abs: 13.4 10*3/uL — ABNORMAL HIGH (ref 1.4–6.5)
PLATELETS: 133 10*3/uL — AB (ref 150–440)
RBC: 5.01 MIL/uL (ref 4.40–5.90)
RDW: 13 % (ref 11.5–14.5)
WBC: 17.5 10*3/uL — AB (ref 3.8–10.6)

## 2015-04-23 LAB — URINALYSIS COMPLETE WITH MICROSCOPIC (ARMC ONLY)
BILIRUBIN URINE: NEGATIVE
GLUCOSE, UA: NEGATIVE mg/dL
Nitrite: POSITIVE — AB
Protein, ur: 100 mg/dL — AB
Specific Gravity, Urine: 1.019 (ref 1.005–1.030)
pH: 5 (ref 5.0–8.0)

## 2015-04-23 LAB — C DIFFICILE QUICK SCREEN W PCR REFLEX
C DIFFICILE (CDIFF) INTERP: POSITIVE
C DIFFICILE (CDIFF) TOXIN: POSITIVE — AB
C Diff antigen: POSITIVE — AB

## 2015-04-23 LAB — LIPASE, BLOOD: LIPASE: 10 U/L — AB (ref 11–51)

## 2015-04-23 MED ORDER — TAMSULOSIN HCL 0.4 MG PO CAPS
0.4000 mg | ORAL_CAPSULE | Freq: Every day | ORAL | Status: DC
  Administered 2015-04-23 – 2015-04-29 (×7): 0.4 mg via ORAL
  Filled 2015-04-23 (×7): qty 1

## 2015-04-23 MED ORDER — ALUM & MAG HYDROXIDE-SIMETH 200-200-20 MG/5ML PO SUSP: 30.0000 mL | Freq: Four times a day (QID) | ORAL | Status: DC | PRN

## 2015-04-23 MED ORDER — VANCOMYCIN 50 MG/ML ORAL SOLUTION
250.0000 mg | Freq: Four times a day (QID) | ORAL | Status: DC
  Administered 2015-04-23 – 2015-04-24 (×3): 250 mg via ORAL
  Filled 2015-04-23 (×6): qty 5

## 2015-04-23 MED ORDER — ONDANSETRON HCL 4 MG PO TABS: 4.0000 mg | ORAL_TABLET | Freq: Four times a day (QID) | ORAL | Status: DC | PRN

## 2015-04-23 MED ORDER — ACETAMINOPHEN 325 MG PO TABS
650.0000 mg | ORAL_TABLET | Freq: Four times a day (QID) | ORAL | Status: DC | PRN
  Administered 2015-04-23 – 2015-04-28 (×4): 650 mg via ORAL
  Filled 2015-04-23 (×4): qty 2

## 2015-04-23 MED ORDER — ENOXAPARIN SODIUM 40 MG/0.4ML ~~LOC~~ SOLN
40.0000 mg | SUBCUTANEOUS | Status: DC
  Administered 2015-04-23 – 2015-04-28 (×6): 40 mg via SUBCUTANEOUS
  Filled 2015-04-23 (×7): qty 0.4

## 2015-04-23 MED ORDER — SODIUM CHLORIDE 0.9 % IV BOLUS (SEPSIS)
1000.0000 mL | Freq: Once | INTRAVENOUS | Status: AC
  Administered 2015-04-23: 1000 mL via INTRAVENOUS

## 2015-04-23 MED ORDER — ONDANSETRON HCL 4 MG/2ML IJ SOLN
4.0000 mg | Freq: Four times a day (QID) | INTRAMUSCULAR | Status: DC | PRN
  Administered 2015-04-23: 4 mg via INTRAVENOUS
  Filled 2015-04-23: qty 2

## 2015-04-23 MED ORDER — POTASSIUM CHLORIDE IN NACL 20-0.9 MEQ/L-% IV SOLN
INTRAVENOUS | Status: DC
  Administered 2015-04-24 – 2015-04-26 (×5): via INTRAVENOUS
  Filled 2015-04-23 (×8): qty 1000

## 2015-04-23 MED ORDER — CEFTRIAXONE SODIUM 1 G IJ SOLR
1.0000 g | INTRAMUSCULAR | Status: DC
  Administered 2015-04-23 – 2015-04-28 (×6): 1 g via INTRAVENOUS
  Filled 2015-04-23 (×7): qty 10

## 2015-04-23 MED ORDER — IOHEXOL 350 MG/ML SOLN
100.0000 mL | Freq: Once | INTRAVENOUS | Status: AC | PRN
  Administered 2015-04-23: 100 mL via INTRAVENOUS

## 2015-04-23 MED ORDER — HYDROMORPHONE HCL 1 MG/ML IJ SOLN
0.5000 mg | Freq: Once | INTRAMUSCULAR | Status: AC
  Administered 2015-04-23: 0.5 mg via INTRAVENOUS
  Filled 2015-04-23: qty 1

## 2015-04-23 MED ORDER — ONDANSETRON HCL 4 MG/2ML IJ SOLN
4.0000 mg | Freq: Once | INTRAMUSCULAR | Status: AC
  Administered 2015-04-23: 4 mg via INTRAVENOUS
  Filled 2015-04-23: qty 2

## 2015-04-23 MED ORDER — IOHEXOL 240 MG/ML SOLN
25.0000 mL | Freq: Once | INTRAMUSCULAR | Status: AC | PRN
  Administered 2015-04-23: 25 mL via ORAL

## 2015-04-23 MED ORDER — ACETAMINOPHEN 650 MG RE SUPP: 650.0000 mg | Freq: Four times a day (QID) | RECTAL | Status: DC | PRN

## 2015-04-23 NOTE — ED Provider Notes (Signed)
Time Seen: Approximately 1500 I have reviewed the triage notes  Chief Complaint: Abdominal Pain   History of Present Illness: Gerald Odonnell is a 59 y.o. male who presents with a 3 week history of lower middle quadrant abdominal pain. Patient has a history of a suprapubic catheter and a history of colon cancer with bowel resection. He himself is extremely poor historian but from what I can obtain he's had watery stool now for the past 2 weeks. He is followed by a gastroenterologist who plans an upper and lower endoscopic exam. He describes it as feeling nauseated and noticed that his urine may have blood or dark appearance to it he describes approximately 15 loose bowel movements a day. He states his gastroenterologist gave them "" 10 pills "" which he states he's taken but hasn't noticed any improvement. He is not aware of any fever at home. He denies any upper abdominal pain or chest discomfort.   Past Medical History  Diagnosis Date  . Urinary retention   . Shortness of breath dyspnea   . Cancer ([place])     colon  . S/P colon resection   . Hypertension     There are no active problems to display for this patient.   Past Surgical History  Procedure Laterality Date  . Abdominal surgery    . Suprapubic catheter insertion      Past Surgical History  Procedure Laterality Date  . Abdominal surgery    . Suprapubic catheter insertion      Current Outpatient Rx  Name  Route  Sig  Dispense  Refill  . hydrochlorothiazide (MICROZIDE) 12.5 MG capsule   Oral   Take 12.5 mg by mouth daily.         Marland Kitchen HYDROcodone-acetaminophen (NORCO/VICODIN) 5-325 MG tablet   Oral   Take 1 tablet by mouth 2 (two) times daily as needed for moderate pain.         Marland Kitchen oxybutynin (DITROPAN XL) 10 MG 24 hr tablet   Oral   Take 1 tablet (10 mg total) by mouth daily.   30 tablet   2   . polyethylene glycol (MIRALAX / GLYCOLAX) packet   Oral   Take 17 g by mouth 2 (two) times daily.   14 each    0   . sulfamethoxazole-trimethoprim (BACTRIM DS,SEPTRA DS) 800-160 MG tablet   Oral   Take 1 tablet by mouth 2 (two) times daily.         . tamsulosin (FLOMAX) 0.4 MG CAPS capsule   Oral   Take 0.4 mg by mouth.           Allergies:  Review of patient's allergies indicates no known allergies.  Family History: No family history on file.  Social History: Social History  Substance Use Topics  . Smoking status: Current Every Day Smoker -- 0.50 packs/day    Types: Cigarettes  . Smokeless tobacco: None  . Alcohol Use: No     Review of Systems:   10 point review of systems was performed and was otherwise negative:  Constitutional: No fever Eyes: No visual disturbances ENT: No sore throat, ear pain Cardiac: No chest pain Respiratory: No shortness of breath, wheezing, or stridor Abdomen: Lower middle quadrant abdominal pain with no vomiting. Nauseated frequent loose watery stools. Patient denies any travel Endocrine: No weight loss, No night sweats Extremities: No peripheral edema, cyanosis Skin: No rashes, easy bruising Neurologic: No focal weakness, trouble with speech or swollowing Urologic: No dysuria,  possible Hematuria, or urinary frequency   Physical Exam:  ED Triage Vitals  Enc Vitals Group     BP 04/23/15 1247 148/95 mmHg     Pulse Rate 04/23/15 1247 110     Resp 04/23/15 1247 20     Temp 04/23/15 1247 98.8 F (37.1 C)     Temp Source 04/23/15 1247 Oral     SpO2 04/23/15 1247 95 %     Weight 04/23/15 1247 235 lb (106.595 kg)     Height 04/23/15 1247 5\' 10"  (1.778 m)     Head Cir --      Peak Flow --      Pain Score 04/23/15 1246 8     Pain Loc --      Pain Edu? --      Excl. in [place]? --     General: Awake , Alert , and Oriented times 3; GCS 15 Head: Normal cephalic , atraumatic Eyes: Pupils equal , round, reactive to light Nose/Throat: No nasal drainage, patent upper airway without erythema or exudate.  Neck: Supple, Full range of motion, No  anterior adenopathy or palpable thyroid masses Lungs: Clear to ascultation without wheezes , rhonchi, or rales Heart: Regular rate, regular rhythm without murmurs , gallops , or rubs Abdomen: Patient has tenderness in both left and right lower quadrant without rebound with some guarding no rigidity. Suprapubic catheter present with drainage.       Extremities: 2 plus symmetric pulses. No edema, clubbing or cyanosis Neurologic: normal ambulation, Motor symmetric without deficits, sensory intact Skin: warm, dry, no rashes   Labs:   All laboratory work was reviewed including any pertinent negatives or positives listed below:  Labs Reviewed  CBC WITH DIFFERENTIAL/PLATELET - Abnormal; Notable for the following:    WBC 17.5 (*)    Platelets 133 (*)    Neutro Abs 13.4 (*)    Monocytes Absolute 2.0 (*)    All other components within normal limits  COMPREHENSIVE METABOLIC PANEL - Abnormal; Notable for the following:    ALT 13 (*)    Total Bilirubin 1.4 (*)    All other components within normal limits  LIPASE, BLOOD - Abnormal; Notable for the following:    Lipase 10 (*)    All other components within normal limits  C DIFFICILE QUICK SCREEN W PCR REFLEX  GASTROINTESTINAL PANEL BY PCR, STOOL (REPLACES STOOL CULTURE)  URINALYSIS COMPLETEWITH MICROSCOPIC ([facility] ONLY)   laboratory work shows positive for urinary tract infection with urine culture pending. C. difficile quick screen was also positive   Radiology:    EXAM: CT ABDOMEN AND PELVIS WITH CONTRAST  TECHNIQUE: Multidetector CT imaging of the abdomen and pelvis was performed using the standard protocol following bolus administration of intravenous contrast.  CONTRAST: 158mL OMNIPAQUE IOHEXOL 350 MG/ML SOLN  COMPARISON: CT scan of January 08, 2015.  FINDINGS: Visualized lung bases are unremarkable. No significant osseous abnormality is noted.  No gallstones are noted. The liver, spleen and pancreas appear normal. Left  adrenal gland and kidneys are unremarkable. 2.1 x 1.3 cm right adrenal nodule is noted which is not significantly changed compared to prior exam. No hydronephrosis or renal obstruction is noted. No renal or ureteral calculi are noted. Surgical clips are noted in the left upper quadrant. Mild inflammatory changes are noted around the cecum. The appendix is significantly enlarged compared to prior exam, currently measuring 12 mm in diameter. There is no evidence of bowel obstruction. Bladder is decompressed secondary to suprapubic catheter  placement. Minimal sigmoid diverticulitis may be present. No abnormal fluid collection is noted. Stable enlarged adenopathy is noted in the porta hepatis region.  IMPRESSION: Grossly stable right adrenal nodule is noted. Further evaluation with MRI is recommended given the history of colon cancer.  Mild inflammatory changes are noted around the cecum which may represent colitis or typhlitis.  The appendix is significantly enlarged since prior exam, now measuring 12 mm in diameter. However, no definite inflammation is noted around the cecum. These findings are concerning for possible appendicitis, and correlation with clinical laboratory findings is recommended.  Possible minimal proximal sigmoid diverticulitis is noted.  Interval placement of suprapubic catheter, with decompression of the urinary bladder.   I personally reviewed the radiologic studies     ED Course:  The pills that the patient was placed on a turned out to be amoxicillin. Patient appears to have a dual complaints at this point of the urinary tract infection associated with suprapubic catheter along with new-onset C. difficile colitis. Antibiotics. This will have to be performed at this point. Given the dual causes for his presentation at this time I felt required internal medicine evaluation. Not appear to be septic at this time.    Assessment:  C. difficile  colitis Urinary tract infection Suprapubic catheter      Plan: Inpatient management            Daymon Larsen, MD 04/23/15 2028

## 2015-04-23 NOTE — ED Notes (Signed)
Lab called to report positive C-diff sample

## 2015-04-23 NOTE — H&P (Signed)
[facility] [place] NAME: Gerald Odonnell    MR#:  [id number]  DATE OF BIRTH:  10/18/1954  DATE OF ADMISSION:  04/23/2015  PRIMARY CARE PHYSICIAN: Christie Nottingham., PA   REQUESTING/REFERRING PHYSICIAN: brian Nathaneil Canary  CHIEF COMPLAINT:   Chief Complaint  Patient presents with  . Abdominal Pain    HISTORY OF PRESENT ILLNESS:  Gerald Odonnell  is a 59 y.o. male with a known history of hypertension, pH comes in with diarrhea. Patient is having the 15th loose stools for the past 15 days. But they did not come to hospital because of financial reasons. Patient complains of loss of appetite and abdominal pain across the lower abdomen from left to right. No nausea. No vomiting. Also has urinary retention and has suprapubic catheter for the past 2 months inserted by urology urologist at [facility]. Found to have C. difficile for colitis, UTI.    aPAST MEDICAL HISTORY:   Past Medical History  Diagnosis Date  . Urinary retention   . Shortness of breath dyspnea   . Cancer ([place])     colon  . S/P colon resection   . Hypertension     PAST SURGICAL HISTOIRY:   Past Surgical History  Procedure Laterality Date  . Abdominal surgery    . Suprapubic catheter insertion      SOCIAL HISTORY:   Social History  Substance Use Topics  . Smoking status: Current Every Day Smoker -- 0.50 packs/day    Types: Cigarettes  . Smokeless tobacco: Not on file  . Alcohol Use: No    FAMILY HISTORY:  No family history on file.  DRUG ALLERGIES:  No Known Allergies  REVIEW OF SYSTEMS:  CONSTITUTIONAL: No fever, fatigue or weakness.  EYES: No blurred or double vision.  EARS, NOSE, AND THROAT: No tinnitus or ear pain.  RESPIRATORY: No cough, shortness of breath, wheezing or hemoptysis.  CARDIOVASCULAR: No chest pain, orthopnea, edema.  GASTDiarrhea, abdominal pain. Mild left to lower quadrant and right lower quadrant abdominal pain present  gENITOURINARY: No dysuria, hematuria.  ENDOCRINE: No polyuria, nocturia,  HEMATOLOGY: No anemia, easy bruising or bleeding SKIN: No rash or lesion. MUSCULOSKELETAL: No joint pain or arthritis.   NEUROLOGIC: No tingling, numbness, weakness.  PSYCHIATRY: No anxiety or depression.   MEDICATIONS AT HOME:   Prior to Admission medications   Medication Sig Start Date End Date Taking? Authorizing Provider  hydrochlorothiazide (MICROZIDE) 12.5 MG capsule Take 12.5 mg by mouth daily.   Yes Historical Provider, MD      VITAL SIGNS:  Blood pressure 143/82, pulse 103, temperature 98.8 F (37.1 C), temperature source Oral, resp. rate 20, height 5\' 10"  (1.778 m), weight 106.595 kg (235 lb), SpO2 96 %.  PHYSICAL EXAMINATION:  GENERAL:  59 y.o.-year-old patient lying in the bed with no acute distress.  EYES: Pupils equal, round, reactive to light and accommodation. No scleral icterus. Extraocular muscles intact.  HEENT: Head atraumatic, normocephalic. Oropharynx and nasopharynx clear.  NECK:  Supple, no jugular venous distention. No thyroid enlargement, no tenderness.  LUNGS: Normal breath sounds bilaterally, no wheezing, rales,rhonchi or crepitation. No use of accessory muscles of respiration.  CARDIOVASCULAR: S1, S2 normal. No murmurs, rubs, or gallops.  ABDO;Has tenderness in the left lower quadrant to palpation. Also tender to palpation in the right lower quadrant. EXTREMITIES: No pedal edema, cyanosis, or clubbing.  NEUROLOGIC: Cranial nerves II through XII are intact. Muscle strength 5/5 in all extremities. Sensation intact.  Gait not checked.  PSYCHIATRIC: The patient is alert and oriented x 3.  SKIN: No obvious rash, lesion, or ulcer.   LABORATORY PANEL:   CBC  Recent Labs Lab 04/23/15 1252  WBC 17.5*  HGB 16.6  HCT 49.0  PLT 133*   ------------------------------------------------------------------------------------------------------------------  Chemistries   Recent Labs Lab  04/23/15 1252  NA 138  K 4.0  CL 101  CO2 28  GLUCOSE 82  BUN 13  CREATININE 0.87  CALCIUM 8.9  AST 18  ALT 13*  ALKPHOS 59  BILITOT 1.4*   ------------------------------------------------------------------------------------------------------------------  Cardiac Enzymes No results for input(s): TROPONINI in the last 168 hours. ------------------------------------------------------------------------------------------------------------------  RADIOLOGY:  Ct Abdomen Pelvis W Contrast  04/23/2015  CLINICAL DATA:  Lower abdominal pain for 2 weeks. EXAM: CT ABDOMEN AND PELVIS WITH CONTRAST TECHNIQUE: Multidetector CT imaging of the abdomen and pelvis was performed using the standard protocol following bolus administration of intravenous contrast. CONTRAST:  159mL OMNIPAQUE IOHEXOL 350 MG/ML SOLN COMPARISON:  CT scan of January 08, 2015. FINDINGS: Visualized lung bases are unremarkable. No significant osseous abnormality is noted. No gallstones are noted. The liver, spleen and pancreas appear normal. Left adrenal gland and kidneys are unremarkable. 2.1 x 1.3 cm right adrenal nodule is noted which is not significantly changed compared to prior exam. No hydronephrosis or renal obstruction is noted. No renal or ureteral calculi are noted. Surgical clips are noted in the left upper quadrant. Mild inflammatory changes are noted around the cecum. The appendix is significantly enlarged compared to prior exam, currently measuring 12 mm in diameter. There is no evidence of bowel obstruction. Bladder is decompressed secondary to suprapubic catheter placement. Minimal sigmoid diverticulitis may be present. No abnormal fluid collection is noted. Stable enlarged adenopathy is noted in the porta hepatis region. IMPRESSION: Grossly stable right adrenal nodule is noted. Further evaluation with MRI is recommended given the history of colon cancer. Mild inflammatory changes are noted around the cecum which may  represent colitis or typhlitis. The appendix is significantly enlarged since prior exam, now measuring 12 mm in diameter. However, no definite inflammation is noted around the cecum. These findings are concerning for possible appendicitis, and correlation with clinical laboratory findings is recommended. Possible minimal proximal sigmoid diverticulitis is noted. Interval placement of suprapubic catheter, with decompression of the urinary bladder. Electronically Signed   By: [name], M.D.   On: 04/23/2015 16:12    EKG:   Orders placed or performed during the hospital encounter of 01/08/15  . ED EKG  . ED EKG  . EKG 12-Lead  . EKG 12-Lead  . EKG 12-Lead  . EKG 12-Lead  . EKG    IMPRESSION AND PLAN:   #1 diarrhea secondary to C. difficile colitis: Contact isolation, continue vancomycin 125 mg every 6 hours, follow clinical improvement. Dehydration with diarrhea start IV hydration. #2. catherterrelated UTI: Patient comes with catheter. Started on IV Rocephin. Follow culture data and adjust antibiotics. #3.HTN;controlled hold his HCTZ because of diarrhea and dehydration. For  Acute appendicitis by CAT scan: ER physician contacted surgeon who did not think it is acute appendicitis. We will consult surgery    All the records are reviewed and case discussed with ED provider. Management plans discussed with the patient, family and they are in agreement.  CODE STATUS: full  TOTAL TIME TAKING CARE OF THIS PATIENT: 55 minutes.    Epifanio Lesches M.D on 04/23/2015 at 8:08 PM  Between 7am to 6pm - Pager - [phone]  After 6pm  go to [email or web address] - password [email or web address] [facility]  Office  [phone]  CC: Primary care physician; Christie Nottingham., PA  Note: This dictation was prepared with Dragon dictation along with smaller phrase technology. Any transcriptional errors that result from this process are unintentional.

## 2015-04-23 NOTE — ED Notes (Signed)
States he has not been able to eat d/t loss of appetite  Also has had approx 15 loose stools today  weakness

## 2015-04-24 DIAGNOSIS — A09 Infectious gastroenteritis and colitis, unspecified: Secondary | ICD-10-CM

## 2015-04-24 DIAGNOSIS — A0472 Enterocolitis due to Clostridium difficile, not specified as recurrent: Secondary | ICD-10-CM | POA: Insufficient documentation

## 2015-04-24 DIAGNOSIS — A047 Enterocolitis due to Clostridium difficile: Principal | ICD-10-CM

## 2015-04-24 LAB — BASIC METABOLIC PANEL
Anion gap: 5 (ref 5–15)
BUN: 10 mg/dL (ref 6–20)
CHLORIDE: 104 mmol/L (ref 101–111)
CO2: 27 mmol/L (ref 22–32)
CREATININE: 0.87 mg/dL (ref 0.61–1.24)
Calcium: 8.1 mg/dL — ABNORMAL LOW (ref 8.9–10.3)
GFR calc Af Amer: >60 mL/min (ref >60)
GFR calc non Af Amer: >60 mL/min (ref >60)
GLUCOSE: 78 mg/dL (ref 65–99)
Potassium: 3.7 mmol/L (ref 3.5–5.1)
SODIUM: 136 mmol/L (ref 135–145)

## 2015-04-24 LAB — CBC
HCT: 41.7 % (ref 40.0–52.0)
Hemoglobin: 14.4 g/dL (ref 13.0–18.0)
MCH: 33.9 pg (ref 26.0–34.0)
MCHC: 34.7 g/dL (ref 32.0–36.0)
MCV: 97.9 fL (ref 80.0–100.0)
PLATELETS: 100 10*3/uL — AB (ref 150–440)
RBC: 4.25 MIL/uL — ABNORMAL LOW (ref 4.40–5.90)
RDW: 13.1 % (ref 11.5–14.5)
WBC: 14.1 10*3/uL — AB (ref 3.8–10.6)

## 2015-04-24 MED ORDER — MORPHINE SULFATE (PF) 2 MG/ML IV SOLN
2.0000 mg | INTRAVENOUS | Status: DC | PRN
Start: 2015-04-24 — End: 2015-04-26
  Administered 2015-04-24 – 2015-04-26 (×14): 2 mg via INTRAVENOUS
  Filled 2015-04-24 (×14): qty 1

## 2015-04-24 MED ORDER — METRONIDAZOLE IN NACL 5-0.79 MG/ML-% IV SOLN
500.0000 mg | Freq: Three times a day (TID) | INTRAVENOUS | Status: DC
  Administered 2015-04-24 – 2015-04-27 (×10): 500 mg via INTRAVENOUS
  Filled 2015-04-24 (×13): qty 100

## 2015-04-24 MED ORDER — RISAQUAD PO CAPS
2.0000 | ORAL_CAPSULE | Freq: Every day | ORAL | Status: DC
  Administered 2015-04-24 – 2015-04-29 (×6): 2 via ORAL
  Filled 2015-04-24 (×6): qty 2

## 2015-04-24 NOTE — Progress Notes (Signed)
Pt c/o pain in abdomen at a 9. Only has tylenol. Notified Dr.Pyreddy and orders for morphine 2mg  q4 PRN IV

## 2015-04-24 NOTE — H&P (Signed)
Gerald Odonnell is an 59 y.o. male.   Chief Complaint: abdominal pain and diarrhea HPI: 59 yr old male with C diff colitis.  Patient presented to the ED yesterday after three weeks of lower abdominal pain, fever, chills and multiple bouts of diarrhea per day.  Patient states that he had an enlarged prostate and urinary retention about 2 months prior when the current suprapubic catheter was placed and he was put on antibiotics.  Patient states that right after the antibiotics finished he began having diarrhea, multiple times per day.  He states that he also began having cramping intermittent abdominal pain in the left upper abdomen and down into bilateral lower abdomen.  Patient states that the diarrhea and pain have no subsided but have gotten worse and caused him to have a very sore rectum as well.  He denies any specific right sided pain and states it has always been on both sides and isn't worse on one or the other.  He states that he has also had a lack of an appetite along with the pain but has been drinking fluids well. He also endorses some shortness of breath and coughing but denies any sputum production.    Past Medical History  Diagnosis Date  . Urinary retention   . Shortness of breath dyspnea   . Cancer ([place])     colon  . S/P colon resection   . Hypertension     Past Surgical History  Procedure Laterality Date  . Abdominal surgery    . Suprapubic catheter insertion      No family history on file. Social History:  reports that he has been smoking Cigarettes.  He has been smoking about 0.50 packs per day. He does not have any smokeless tobacco history on file. He reports that he does not drink alcohol. His drug history is not on file.  Allergies: No Known Allergies  Medications Prior to Admission  Medication Sig Dispense Refill  . hydrochlorothiazide (MICROZIDE) 12.5 MG capsule Take 12.5 mg by mouth daily.      Results for orders placed or performed during the hospital  encounter of 04/23/15 (from the past 48 hour(s))  CBC WITH DIFFERENTIAL     Status: Abnormal   Collection Time: 04/23/15 12:52 PM  Result Value Ref Range   WBC 17.5 (H) 3.8 - 10.6 K/uL   RBC 5.01 4.40 - 5.90 MIL/uL   Hemoglobin 16.6 13.0 - 18.0 g/dL   HCT 49.0 40.0 - 52.0 %   MCV 97.8 80.0 - 100.0 fL   MCH 33.2 26.0 - 34.0 pg   MCHC 33.9 32.0 - 36.0 g/dL   RDW 13.0 11.5 - 14.5 %   Platelets 133 (L) 150 - 440 K/uL   Neutrophils Relative % 77 %   Neutro Abs 13.4 (H) 1.4 - 6.5 K/uL   Lymphocytes Relative 11 %   Lymphs Abs 2.0 1.0 - 3.6 K/uL   Monocytes Relative 11 %   Monocytes Absolute 2.0 (H) 0.2 - 1.0 K/uL   Eosinophils Relative 0 %   Eosinophils Absolute 0.0 0 - 0.7 K/uL   Basophils Relative 1 %   Basophils Absolute 0.1 0 - 0.1 K/uL  Comprehensive metabolic panel     Status: Abnormal   Collection Time: 04/23/15 12:52 PM  Result Value Ref Range   Sodium 138 135 - 145 mmol/L   Potassium 4.0 3.5 - 5.1 mmol/L   Chloride 101 101 - 111 mmol/L   CO2 28 22 - 32 mmol/L  Glucose, Bld 82 65 - 99 mg/dL   BUN 13 6 - 20 mg/dL   Creatinine, Ser 0.87 0.61 - 1.24 mg/dL   Calcium 8.9 8.9 - 10.3 mg/dL   Total Protein 6.8 6.5 - 8.1 g/dL   Albumin 3.6 3.5 - 5.0 g/dL   AST 18 15 - 41 U/L   ALT 13 (L) 17 - 63 U/L   Alkaline Phosphatase 59 38 - 126 U/L   Total Bilirubin 1.4 (H) 0.3 - 1.2 mg/dL   GFR calc non Af Amer >60 >60 mL/min   GFR calc Af Amer >60 >60 mL/min    Comment: (NOTE) The eGFR has been calculated using the CKD EPI equation. This calculation has not been validated in all clinical situations. eGFR's persistently <60 mL/min signify possible Chronic Kidney Disease.    Anion gap 9 5 - 15  Lipase, blood     Status: Abnormal   Collection Time: 04/23/15 12:52 PM  Result Value Ref Range   Lipase 10 (L) 11 - 51 U/L  Urinalysis complete, with microscopic ([facility] only)     Status: Abnormal   Collection Time: 04/23/15  3:02 PM  Result Value Ref Range   Color, Urine AMBER (A) YELLOW    APPearance HAZY (A) CLEAR   Glucose, UA NEGATIVE NEGATIVE mg/dL   Bilirubin Urine NEGATIVE NEGATIVE   Ketones, ur 1+ (A) NEGATIVE mg/dL   Specific Gravity, Urine 1.019 1.005 - 1.030   Hgb urine dipstick 3+ (A) NEGATIVE   pH 5.0 5.0 - 8.0   Protein, ur 100 (A) NEGATIVE mg/dL   Nitrite POSITIVE (A) NEGATIVE   Leukocytes, UA 2+ (A) NEGATIVE   RBC / HPF TOO NUMEROUS TO COUNT 0 - 5 RBC/hpf   WBC, UA TOO NUMEROUS TO COUNT 0 - 5 WBC/hpf   Bacteria, UA MANY (A) NONE SEEN   Squamous Epithelial / LPF 0-5 (A) NONE SEEN   Mucous PRESENT   Urine culture     Status: None (Preliminary result)   Collection Time: 04/23/15  3:02 PM  Result Value Ref Range   Specimen Description URINE, CATHETERIZED    Special Requests NONE    Culture      >=100,000 COLONIES/mL GRAM NEGATIVE RODS IDENTIFICATION AND SUSCEPTIBILITIES TO FOLLOW    Report Status PENDING   Gastrointestinal Panel by PCR , Stool     Status: None   Collection Time: 04/23/15  3:56 PM  Result Value Ref Range   Campylobacter species NOT DETECTED NOT DETECTED   Plesimonas shigelloides NOT DETECTED NOT DETECTED   Salmonella species NOT DETECTED NOT DETECTED   Yersinia enterocolitica NOT DETECTED NOT DETECTED   Vibrio species NOT DETECTED NOT DETECTED   Vibrio cholerae NOT DETECTED NOT DETECTED   Enteroaggregative E coli (EAEC) NOT DETECTED NOT DETECTED   Enteropathogenic E coli (EPEC) NOT DETECTED NOT DETECTED   Enterotoxigenic E coli (ETEC) NOT DETECTED NOT DETECTED   Shiga like toxin producing E coli (STEC) NOT DETECTED NOT DETECTED   E. coli O157 NOT DETECTED NOT DETECTED   Shigella/Enteroinvasive E coli (EIEC) NOT DETECTED NOT DETECTED   Cryptosporidium NOT DETECTED NOT DETECTED   Cyclospora cayetanensis NOT DETECTED NOT DETECTED   Entamoeba histolytica NOT DETECTED NOT DETECTED   Giardia lamblia NOT DETECTED NOT DETECTED   Adenovirus F40/41 NOT DETECTED NOT DETECTED   Astrovirus NOT DETECTED NOT DETECTED   Norovirus GI/GII NOT  DETECTED NOT DETECTED   Rotavirus A NOT DETECTED NOT DETECTED   Sapovirus (I, II, IV, and V)  NOT DETECTED NOT DETECTED  C difficile quick scan w PCR reflex     Status: Abnormal   Collection Time: 04/23/15  3:58 PM  Result Value Ref Range   C Diff antigen POSITIVE (A) NEGATIVE   C Diff toxin POSITIVE (A) NEGATIVE   C Diff interpretation      Positive for toxigenic C. difficile, active toxin production present.    Comment: CRITICAL RESULT CALLED TO, READ BACK BY AND VERIFIED WITH: AMBER JONES RN AT [id number]   Basic metabolic panel     Status: Abnormal   Collection Time: 04/24/15  5:16 AM  Result Value Ref Range   Sodium 136 135 - 145 mmol/L   Potassium 3.7 3.5 - 5.1 mmol/L   Chloride 104 101 - 111 mmol/L   CO2 27 22 - 32 mmol/L   Glucose, Bld 78 65 - 99 mg/dL   BUN 10 6 - 20 mg/dL   Creatinine, Ser 0.87 0.61 - 1.24 mg/dL   Calcium 8.1 (L) 8.9 - 10.3 mg/dL   GFR calc non Af Amer >60 >60 mL/min   GFR calc Af Amer >60 >60 mL/min    Comment: (NOTE) The eGFR has been calculated using the CKD EPI equation. This calculation has not been validated in all clinical situations. eGFR's persistently <60 mL/min signify possible Chronic Kidney Disease.    Anion gap 5 5 - 15  CBC     Status: Abnormal   Collection Time: 04/24/15  5:16 AM  Result Value Ref Range   WBC 14.1 (H) 3.8 - 10.6 K/uL   RBC 4.25 (L) 4.40 - 5.90 MIL/uL   Hemoglobin 14.4 13.0 - 18.0 g/dL   HCT 41.7 40.0 - 52.0 %   MCV 97.9 80.0 - 100.0 fL   MCH 33.9 26.0 - 34.0 pg   MCHC 34.7 32.0 - 36.0 g/dL   RDW 13.1 11.5 - 14.5 %   Platelets 100 (L) 150 - 440 K/uL   Ct Abdomen Pelvis W Contrast  04/23/2015  CLINICAL DATA:  Lower abdominal pain for 2 weeks. EXAM: CT ABDOMEN AND PELVIS WITH CONTRAST TECHNIQUE: Multidetector CT imaging of the abdomen and pelvis was performed using the standard protocol following bolus administration of intravenous contrast. CONTRAST:  166m OMNIPAQUE IOHEXOL 350 MG/ML SOLN COMPARISON:  CT scan of  January 08, 2015. FINDINGS: Visualized lung bases are unremarkable. No significant osseous abnormality is noted. No gallstones are noted. The liver, spleen and pancreas appear normal. Left adrenal gland and kidneys are unremarkable. 2.1 x 1.3 cm right adrenal nodule is noted which is not significantly changed compared to prior exam. No hydronephrosis or renal obstruction is noted. No renal or ureteral calculi are noted. Surgical clips are noted in the left upper quadrant. Mild inflammatory changes are noted around the cecum. The appendix is significantly enlarged compared to prior exam, currently measuring 12 mm in diameter. There is no evidence of bowel obstruction. Bladder is decompressed secondary to suprapubic catheter placement. Minimal sigmoid diverticulitis may be present. No abnormal fluid collection is noted. Stable enlarged adenopathy is noted in the porta hepatis region. IMPRESSION: Grossly stable right adrenal nodule is noted. Further evaluation with MRI is recommended given the history of colon cancer. Mild inflammatory changes are noted around the cecum which may represent colitis or typhlitis. The appendix is significantly enlarged since prior exam, now measuring 12 mm in diameter. However, no definite inflammation is noted around the cecum. These findings are concerning for possible appendicitis, and correlation with clinical laboratory findings is  recommended. Possible minimal proximal sigmoid diverticulitis is noted. Interval placement of suprapubic catheter, with decompression of the urinary bladder. Electronically Signed   By: [name], M.D.   On: 04/23/2015 16:12    Review of Systems  Constitutional: Positive for fever, chills and malaise/fatigue. Negative for weight loss and diaphoresis.  HENT: Negative for congestion and sore throat.   Respiratory: Positive for cough and shortness of breath. Negative for sputum production and wheezing.   Cardiovascular: Positive for leg  swelling. Negative for chest pain and palpitations.  Gastrointestinal: Positive for nausea, abdominal pain and diarrhea. Negative for heartburn, vomiting, constipation, blood in stool and melena.  Genitourinary: Positive for dysuria, hematuria and flank pain.  Musculoskeletal: Negative for back pain, joint pain and falls.  Skin: Negative for itching and rash.  Neurological: Positive for weakness. Negative for dizziness and loss of consciousness.  Psychiatric/Behavioral: Negative for depression. The patient is not nervous/anxious.   All other systems reviewed and are negative.   Blood pressure 125/78, pulse 110, temperature 100.4 F (38 C), temperature source Oral, resp. rate 18, height '5\' 10"'  (1.778 m), weight 240 lb 11.2 oz (109.181 kg), SpO2 94 %. Physical Exam  Vitals reviewed. Constitutional: He is oriented to person, place, and time. He appears well-developed and well-nourished. No distress.  HENT:  Head: Normocephalic and atraumatic.  Right Ear: External ear normal.  Left Ear: External ear normal.  Nose: Nose normal.  Mouth/Throat: Oropharynx is clear and moist. No oropharyngeal exudate.  Eyes: Conjunctivae are normal. Pupils are equal, round, and reactive to light. No scleral icterus.  Neck: Normal range of motion. Neck supple. No tracheal deviation present.  Cardiovascular: Normal rate, regular rhythm, normal heart sounds and intact distal pulses.  Exam reveals no gallop and no friction rub.   No murmur heard. Respiratory: Effort normal and breath sounds normal. No respiratory distress. He has no wheezes. He has no rales.  GI: Soft. Bowel sounds are normal. He exhibits distension. He exhibits no mass. There is tenderness. There is no rebound and no guarding.  Hyperactive bowel sounds, obese, mild distension, abdominal pain in LUQ, LLQ and RLQ.  Suprapubic catheter in place  Musculoskeletal: Normal range of motion. He exhibits edema. He exhibits no tenderness.  Bilateral 1+  pitting edema in lower extremities  Neurological: He is alert and oriented to person, place, and time. No cranial nerve deficit.  Skin: Skin is warm and dry. No rash noted. No erythema. No pallor.  Psychiatric: He has a normal mood and affect. His behavior is normal. Judgment and thought content normal.     Assessment/Plan 59 yr old male with C. Diff colitis.    I have personally reviewed his laboratory values of elevated WBC and + C. Diff stool PCR.  I have personally reviewed the CT scan images that show a slightly enlarged appendix without any inflammation or stranding near the appendix or cecum but some  inflammation around the descending colon.  I have also reviewed the radiology read as above.   Given this patient's clear history of pain, diarrhea and sequale happening after a dose of antibiotics and continuing without change for a 3 week history, strongly feel this is NOT appendicitis but rather C.diff colitis.  If the patient had appendicitis for this time period would have evidence of stranding, inflammation, likely perforation and abscess on the CT scan at the 3 week time mark; of which there is no evidence.   Patient denies any Right sided pain worse than Left  upper or lower quadrant and He states the pain comes on and then a few minutes later, like clockwork he will have diarrhea, all of which is consistent with C. Diff colitis but is not consistent with appendicitis.   I would recommend for his mild to moderate C.diff to treat with Flagyl 529m either IV or PO, studies have shown both types of administration have equal efficacy if no ileus is present.  If he were to worsen or not progress Vancomycin PO should be added and rectally if ileus present.  Will sign off as no surgical intervention needed, please give [place] call if his condition should worsen.    CLavona MoundLoflin 04/24/2015, 5:16 PM

## 2015-04-24 NOTE — Progress Notes (Signed)
[facility] NAME: Gerald Odonnell    MR#:  [id number]  DATE OF BIRTH:  10/18/1954  SUBJECTIVE: admitted for C. difficile colitis, UTI.c/o abdominal  Pain. The right lower quadrant, left lower quadrant abdominal pain. Had 6 loose stools today  CHIEF COMPLAINT:   Chief Complaint  Patient presents with  . Abdominal Pain    REVIEW OF SYSTEMS:   ROS CONSTITUTIONAL: No fever, fatigue or weakness.  EYES: No blurred or double vision.  EARS, NOSE, AND THROAT: No tinnitus or ear pain.  RESPIRATORY: No cough, shortness of breath, wheezing or hemoptysis.  CARDIOVASCULAR: No chest pain, orthopnea, edema.  GASTROINTESTINAL:had nausea/diarrhoea/complaints of lower quadrant abdominal pain mainly in the right side. GENITOURINARY: No dysuria, hematuria.  ENDOCRINE: No polyuria, nocturia,  HEMATOLOGY: No anemia, easy bruising or bleeding SKIN: No rash or lesion. MUSCULOSKELETAL: No joint pain or arthritis.   NEUROLOGIC: No tingling, numbness, weakness.  PSYCHIATRY: No anxiety or depression.   DRUG ALLERGIES:  No Known Allergies  VITALS:  Blood pressure 125/78, pulse 110, temperature 100.4 F (38 C), temperature source Oral, resp. rate 18, height 5\' 10"  (1.778 m), weight 109.181 kg (240 lb 11.2 oz), SpO2 94 %.  PHYSICAL EXAMINATION:  GENERAL:  59 y.o.-year-old patient lying in the bed with no acute distress.  EYES: Pupils equal, round, reactive to light and accommodation. No scleral icterus. Extraocular muscles intact.  HEENT: Head atraumatic, normocephalic. Oropharynx and nasopharynx clear.  NECK:  Supple, no jugular venous distention. No thyroid enlargement, no tenderness.  LUNGS: Normal breath sounds bilaterally, no wheezing, rales,rhonchi or crepitation. No use of accessory muscles of respiration.  CARDIOVASCULAR: S1, S2 normal. No murmurs, rubs, or gallops.  ABDOMEN: Tenderness to palpation in the right and left lower quadrants.  Bowel sounds are present. Abdomen is slightly distended. EXTREMITIES: No pedal edema, cyanosis, or clubbing.  NEUROLOGIC: Cranial nerves II through XII are intact. Muscle strength 5/5 in all extremities. Sensation intact. Gait not checked.  PSYCHIATRIC: The patient is alert and oriented x 3.  SKIN: No obvious rash, lesion, or ulcer.    LABORATORY PANEL:   CBC  Recent Labs Lab 04/24/15 0516  WBC 14.1*  HGB 14.4  HCT 41.7  PLT 100*   ------------------------------------------------------------------------------------------------------------------  Chemistries   Recent Labs Lab 04/23/15 1252 04/24/15 0516  NA 138 136  K 4.0 3.7  CL 101 104  CO2 28 27  GLUCOSE 82 78  BUN 13 10  CREATININE 0.87 0.87  CALCIUM 8.9 8.1*  AST 18  --   ALT 13*  --   ALKPHOS 59  --   BILITOT 1.4*  --    ------------------------------------------------------------------------------------------------------------------  Cardiac Enzymes No results for input(s): TROPONINI in the last 168 hours. ------------------------------------------------------------------------------------------------------------------  RADIOLOGY:  Ct Abdomen Pelvis W Contrast  04/23/2015  CLINICAL DATA:  Lower abdominal pain for 2 weeks. EXAM: CT ABDOMEN AND PELVIS WITH CONTRAST TECHNIQUE: Multidetector CT imaging of the abdomen and pelvis was performed using the standard protocol following bolus administration of intravenous contrast. CONTRAST:  168mL OMNIPAQUE IOHEXOL 350 MG/ML SOLN COMPARISON:  CT scan of January 08, 2015. FINDINGS: Visualized lung bases are unremarkable. No significant osseous abnormality is noted. No gallstones are noted. The liver, spleen and pancreas appear normal. Left adrenal gland and kidneys are unremarkable. 2.1 x 1.3 cm right adrenal nodule is noted which is not significantly changed compared to prior exam. No hydronephrosis or renal obstruction is noted. No renal or ureteral calculi are noted.  Surgical clips are noted in the left upper quadrant. Mild inflammatory changes are noted around the cecum. The appendix is significantly enlarged compared to prior exam, currently measuring 12 mm in diameter. There is no evidence of bowel obstruction. Bladder is decompressed secondary to suprapubic catheter placement. Minimal sigmoid diverticulitis may be present. No abnormal fluid collection is noted. Stable enlarged adenopathy is noted in the porta hepatis region. IMPRESSION: Grossly stable right adrenal nodule is noted. Further evaluation with MRI is recommended given the history of colon cancer. Mild inflammatory changes are noted around the cecum which may represent colitis or typhlitis. The appendix is significantly enlarged since prior exam, now measuring 12 mm in diameter. However, no definite inflammation is noted around the cecum. These findings are concerning for possible appendicitis, and correlation with clinical laboratory findings is recommended. Possible minimal proximal sigmoid diverticulitis is noted. Interval placement of suprapubic catheter, with decompression of the urinary bladder. Electronically Signed   By: [name], M.D.   On: 04/23/2015 16:12    EKG:   Orders placed or performed during the hospital encounter of 01/08/15  . ED EKG  . ED EKG  . EKG 12-Lead  . EKG 12-Lead  . EKG 12-Lead  . EKG 12-Lead  . EKG    ASSESSMENT AND PLAN:   #1.cdif Colitis: Continue contact isolation, continue vancomycin 125 mg daily, and  For anerobic IV Flagyl 500 mg every 8 hours #2 catheter related UTI: Continue Rocephin, urine cultures showed rods but final results are pending. 3. Possible appendicitis: Does have right lower quadrant abdominal pain: Discussed this with  Surgeon  On call  Dr. Ruffin Frederick  she said. , abdominal pain is due to cdiff and UTI and she is not convinced that this is Appendicitis,official note pending #4 BPH continue Flomax.  D/w family All the records are  reviewed and case discussed with Care Management/Social Workerr. Management plans discussed with the patient, family and they are in agreement.  CODE STATUS: full  TOTAL TIME TAKING CARE OF THIS PATIENT:35 minutes.   POSSIBLE D/C IN 1-2 DAYS, DEPENDING ON CLINICAL CONDITION.   Epifanio Lesches M.D on 04/24/2015 at 2:16 PM  Between 7am to 6pm - Pager - [phone]  After 6pm go to [email or web address] - password [email or web address] [facility]  Office  [phone]  CC: Primary care physician; Christie Nottingham., PA   Note: This dictation was prepared with Dragon dictation along with smaller phrase technology. Any transcriptional errors that result from this process are unintentional.

## 2015-04-25 NOTE — Progress Notes (Signed)
[place] at [place] NAME: Mahir Martinique    MR#:  [id number]  DATE OF BIRTH:  10/18/1954  SUBJECTIVE:  seen today. He says he feels better today. Tolerated the diet. Abdominal pain is better. Patient has  at least  Bowel movements but they are formed.  CHIEF COMPLAINT:   Chief Complaint  Patient presents with  . Abdominal Pain    REVIEW OF SYSTEMS:   ROS CONSTITUTIONAL: No fever, fatigue or weakness.  EYES: No blurred or double vision.  EARS, NOSE, AND THROAT: No tinnitus or ear pain.  RESPIRATORY: No cough, shortness of breath, wheezing or hemoptysis.  CARDIOVASCULAR: No chest pain, orthopnea, edema.  GASTROINTESTINAL:had nausea/diarrhoea Lower quadrant abdominal pain across from right to left side.  GENITOURINARY: No dysuria, hematuria.  ENDOCRINE: No polyuria, nocturia,  HEMATOLOGY: No anemia, easy bruising or bleeding SKIN: No rash or lesion. MUSCULOSKELETAL: No joint pain or arthritis.   NEUROLOGIC: No tingling, numbness, weakness.  PSYCHIATRY: No anxiety or depression.   DRUG ALLERGIES:  No Known Allergies  VITALS:  Blood pressure 120/69, pulse 92, temperature 98.2 F (36.8 C), temperature source Oral, resp. rate 20, height 5\' 10"  (1.778 m), weight 109.181 kg (240 lb 11.2 oz), SpO2 95 %.  PHYSICAL EXAMINATION:  GENERAL:  59 y.o.-year-old patient lying in the bed with no acute distress.  EYES: Pupils equal, round, reactive to light and accommodation. No scleral icterus. Extraocular muscles intact.  HEENT: Head atraumatic, normocephalic. Oropharynx and nasopharynx clear.  NECK:  Supple, no jugular venous distention. No thyroid enlargement, no tenderness.  LUNGS: Normal breath sounds bilaterally, no wheezing, rales,rhonchi or crepitation. No use of accessory muscles of respiration.  CARDIOVASCULAR: S1, S2 normal. No murmurs, rubs, or gallops.  ABDOMEN: Tenderness to palpation in the right and left lower quadrants.  Bowel sounds are present. Abdomen is slightly distended. EXTREMITIES: No pedal edema, cyanosis, or clubbing.  NEUROLOGIC: Cranial nerves II through XII are intact. Muscle strength 5/5 in all extremities. Sensation intact. Gait not checked.  PSYCHIATRIC: The patient is alert and oriented x 3.  SKIN: No obvious rash, lesion, or ulcer.    LABORATORY PANEL:   CBC  Recent Labs Lab 04/24/15 0516  WBC 14.1*  HGB 14.4  HCT 41.7  PLT 100*   ------------------------------------------------------------------------------------------------------------------  Chemistries   Recent Labs Lab 04/23/15 1252 04/24/15 0516  NA 138 136  K 4.0 3.7  CL 101 104  CO2 28 27  GLUCOSE 82 78  BUN 13 10  CREATININE 0.87 0.87  CALCIUM 8.9 8.1*  AST 18  --   ALT 13*  --   ALKPHOS 59  --   BILITOT 1.4*  --    ------------------------------------------------------------------------------------------------------------------  Cardiac Enzymes No results for input(s): TROPONINI in the last 168 hours. ------------------------------------------------------------------------------------------------------------------  RADIOLOGY:  Ct Abdomen Pelvis W Contrast  04/23/2015  CLINICAL DATA:  Lower abdominal pain for 2 weeks. EXAM: CT ABDOMEN AND PELVIS WITH CONTRAST TECHNIQUE: Multidetector CT imaging of the abdomen and pelvis was performed using the standard protocol following bolus administration of intravenous contrast. CONTRAST:  166mL OMNIPAQUE IOHEXOL 350 MG/ML SOLN COMPARISON:  CT scan of January 08, 2015. FINDINGS: Visualized lung bases are unremarkable. No significant osseous abnormality is noted. No gallstones are noted. The liver, spleen and pancreas appear normal. Left adrenal gland and kidneys are unremarkable. 2.1 x 1.3 cm right adrenal nodule is noted which is not significantly changed compared to prior exam. No hydronephrosis or renal obstruction is noted. No  renal or ureteral calculi are noted.  Surgical clips are noted in the left upper quadrant. Mild inflammatory changes are noted around the cecum. The appendix is significantly enlarged compared to prior exam, currently measuring 12 mm in diameter. There is no evidence of bowel obstruction. Bladder is decompressed secondary to suprapubic catheter placement. Minimal sigmoid diverticulitis may be present. No abnormal fluid collection is noted. Stable enlarged adenopathy is noted in the porta hepatis region. IMPRESSION: Grossly stable right adrenal nodule is noted. Further evaluation with MRI is recommended given the history of colon cancer. Mild inflammatory changes are noted around the cecum which may represent colitis or typhlitis. The appendix is significantly enlarged since prior exam, now measuring 12 mm in diameter. However, no definite inflammation is noted around the cecum. These findings are concerning for possible appendicitis, and correlation with clinical laboratory findings is recommended. Possible minimal proximal sigmoid diverticulitis is noted. Interval placement of suprapubic catheter, with decompression of the urinary bladder. Electronically Signed   By: [name], M.D.   On: 04/23/2015 16:12    EKG:   Orders placed or performed during the hospital encounter of 01/08/15  . ED EKG  . ED EKG  . EKG 12-Lead  . EKG 12-Lead  . EKG 12-Lead  . EKG 12-Lead  . EKG    ASSESSMENT AND PLAN:   #1.cdif Colitis: Continue contact isolation,   continue IV Flagyl 500 mg every 8 hours, probiotics, diarrhea is decreasing. Patient  Po take is better. So I will decrease IV fluids.wbc down to 14.1 . #2 catheter related UTI: Continue Rocephin, urine cultures showed gram-negative rods but final results are pending. 3. #4 BPH continue Flomax.  D/w family All the records are reviewed and case discussed with Care Management/Social Workerr. Management plans discussed with the patient, family and they are in agreement.  CODE STATUS:  full  TOTAL TIME TAKING CARE OF THIS PATIENT:35 minutes.   POSSIBLE D/C IN 1-2 DAYS, DEPENDING ON CLINICAL CONDITION.   Epifanio Lesches M.D on 04/25/2015 at 12:22 PM  Between 7am to 6pm - Pager - [phone]  After 6pm go to [email or web address] - password [email or web address] [facility]  Office  [phone]  CC: Primary care physician; Christie Nottingham., PA   Note: This dictation was prepared with Dragon dictation along with smaller phrase technology. Any transcriptional errors that result from this process are unintentional.

## 2015-04-25 NOTE — Plan of Care (Signed)
Problem: Physical Regulation: Goal: Will remain free from infection Outcome: Not Progressing Pt remains on cdiff precautions

## 2015-04-26 LAB — URINE CULTURE: Culture: >=100000

## 2015-04-26 MED ORDER — MORPHINE SULFATE (PF) 2 MG/ML IV SOLN
2.0000 mg | Freq: Four times a day (QID) | INTRAVENOUS | Status: DC
  Administered 2015-04-26 – 2015-04-28 (×8): 2 mg via INTRAVENOUS
  Filled 2015-04-26 (×8): qty 1

## 2015-04-26 NOTE — Progress Notes (Signed)
[facility] at [place] NAME: Gerald Odonnell    MR#:  [id number]  DATE OF BIRTH:  10/18/1954  SUBJECTIVE: admitted for C. difficile colitis, UTI.c/o abdominal  Pain. still complains of abdominal pain: tolerating diet.. Less diarrhea.. I'm suspecting he is getting used to narcotics.   CHIEF COMPLAINT:   Chief Complaint  Patient presents with  . Abdominal Pain    REVIEW OF SYSTEMS:   ROS CONSTITUTIONAL: No fever, fatigue or weakness.  EYES: No blurred or double vision.  EARS, NOSE, AND THROAT: No tinnitus or ear pain.  RESPIRATORY: No cough, shortness of breath, wheezing or hemoptysis.  CARDIOVASCULAR: No chest pain, orthopnea, edema.  GASTROINTESTINAL;complaints of lower quadrant abdominal pain mainly in the right side. GENITOURINARY: No dysuria, hematuria.  ENDOCRINE: No polyuria, nocturia,  HEMATOLOGY: No anemia, easy bruising or bleeding SKIN: No rash or lesion. MUSCULOSKELETAL: No joint pain or arthritis.   NEUROLOGIC: No tingling, numbness, weakness.  PSYCHIATRY: No anxiety or depression.   DRUG ALLERGIES:  No Known Allergies  VITALS:  Blood pressure 129/73, pulse 84, temperature 97.8 F (36.6 C), temperature source Oral, resp. rate 18, height 5\' 10"  (1.778 m), weight 109.181 kg (240 lb 11.2 oz), SpO2 97 %.  PHYSICAL EXAMINATION:  GENERAL:  59 y.o.-year-old patient lying in the bed with no acute distress.  EYES: Pupils equal, round, reactive to light and accommodation. No scleral icterus. Extraocular muscles intact.  HEENT: Head atraumatic, normocephalic. Oropharynx and nasopharynx clear.  NECK:  Supple, no jugular venous distention. No thyroid enlargement, no tenderness.  LUNGS: Normal breath sounds bilaterally, no wheezing, rales,rhonchi or crepitation. No use of accessory muscles of respiration.  CARDIOVASCULAR: S1, S2 normal. No murmurs, rubs, or gallops.  ABDOMEN: Tenderness to palpation in the right and left lower  quadrants. Bowel sounds are present. Abdomen is slightly distended. EXTREMITIES: No pedal edema, cyanosis, or clubbing.  NEUROLOGIC: Cranial nerves II through XII are intact. Muscle strength 5/5 in all extremities. Sensation intact. Gait not checked.  PSYCHIATRIC: The patient is alert and oriented x 3.  SKIN: No obvious rash, lesion, or ulcer.    LABORATORY PANEL:   CBC  Recent Labs Lab 04/24/15 0516  WBC 14.1*  HGB 14.4  HCT 41.7  PLT 100*   ------------------------------------------------------------------------------------------------------------------  Chemistries   Recent Labs Lab 04/23/15 1252 04/24/15 0516  NA 138 136  K 4.0 3.7  CL 101 104  CO2 28 27  GLUCOSE 82 78  BUN 13 10  CREATININE 0.87 0.87  CALCIUM 8.9 8.1*  AST 18  --   ALT 13*  --   ALKPHOS 59  --   BILITOT 1.4*  --    ------------------------------------------------------------------------------------------------------------------  Cardiac Enzymes No results for input(s): TROPONINI in the last 168 hours. ------------------------------------------------------------------------------------------------------------------  RADIOLOGY:  No results found.  EKG:   Orders placed or performed during the hospital encounter of 01/08/15  . ED EKG  . ED EKG  . EKG 12-Lead  . EKG 12-Lead  . EKG 12-Lead  . EKG 12-Lead  . EKG    ASSESSMENT AND PLAN:   #1.cdif Colitis: Continue contact isolation,; continue Flagyl. And also continue probiotic. #2 catheter related UTI: Klebsiella UTI: Sensitive to Rocephin; continue that.   #4 BPH continue Flomax. 5 urine retention  S/p [facility] D/w family All the records are reviewed and case discussed with Care Management/Social Workerr. Management plans discussed with the patient, family and they are in agreement.  CODE STATUS: full  TOTAL TIME TAKING  CARE OF THIS PATIENT:35 minutes.   POSSIBLE D/C IN 1-2 DAYS, DEPENDING ON CLINICAL  CONDITION.   Epifanio Lesches M.D on 04/26/2015 at 11:07 AM  Between 7am to 6pm - Pager - [phone]  After 6pm go to [email or web address] - password [email or web address] [facility]  Office  [phone]  CC: Primary care physician; Christie Nottingham., PA   Note: This dictation was prepared with Dragon dictation along with smaller phrase technology. Any transcriptional errors that result from this process are unintentional.

## 2015-04-27 LAB — BASIC METABOLIC PANEL WITH GFR
Anion gap: 4 — ABNORMAL LOW (ref 5–15)
BUN: 7 mg/dL (ref 6–20)
CO2: 31 mmol/L (ref 22–32)
Calcium: 8.5 mg/dL — ABNORMAL LOW (ref 8.9–10.3)
Chloride: 108 mmol/L (ref 101–111)
Creatinine, Ser: 0.77 mg/dL (ref 0.61–1.24)
GFR calc Af Amer: >60 mL/min
GFR calc non Af Amer: >60 mL/min
Glucose, Bld: 92 mg/dL (ref 65–99)
Potassium: 4 mmol/L (ref 3.5–5.1)
Sodium: 143 mmol/L (ref 135–145)

## 2015-04-27 LAB — CBC
HCT: 39.6 % — ABNORMAL LOW (ref 40.0–52.0)
Hemoglobin: 13.6 g/dL (ref 13.0–18.0)
MCH: 33.6 pg (ref 26.0–34.0)
MCHC: 34.3 g/dL (ref 32.0–36.0)
MCV: 97.9 fL (ref 80.0–100.0)
Platelets: 137 K/uL — ABNORMAL LOW (ref 150–440)
RBC: 4.04 MIL/uL — ABNORMAL LOW (ref 4.40–5.90)
RDW: 13 % (ref 11.5–14.5)
WBC: 6.9 K/uL (ref 3.8–10.6)

## 2015-04-27 MED ORDER — METRONIDAZOLE IN NACL 5-0.79 MG/ML-% IV SOLN
500.0000 mg | Freq: Three times a day (TID) | INTRAVENOUS | Status: DC
  Administered 2015-04-27 – 2015-04-29 (×5): 500 mg via INTRAVENOUS
  Filled 2015-04-27 (×7): qty 100

## 2015-04-27 NOTE — Progress Notes (Signed)
[place] at [place] NAME: Gerald Odonnell    MR#:  [id number]  DATE OF BIRTH:  10/18/1954  SUBJECTIVE: admitted for C. difficile colitis, UTI.c/o abdominal  Pain. still complains of abdominal pain: tolerating diet.. Less diarrhea.. c/o of some blood in the stool.   CHIEF COMPLAINT:   Chief Complaint  Patient presents with  . Abdominal Pain    REVIEW OF SYSTEMS:   ROS CONSTITUTIONAL: No fever, fatigue or weakness.  EYES: No blurred or double vision.  EARS, NOSE, AND THROAT: No tinnitus or ear pain.  RESPIRATORY: No cough, shortness of breath, wheezing or hemoptysis.  CARDIOVASCULAR: No chest pain, orthopnea, edema.  GASTROINTESTINAL;complaints of lower quadrant abdominal pain mainly in the right side. GENITOURINARY: No dysuria, hematuria.  ENDOCRINE: No polyuria, nocturia,  HEMATOLOGY: No anemia, easy bruising or bleeding SKIN: No rash or lesion. MUSCULOSKELETAL: No joint pain or arthritis.   NEUROLOGIC: No tingling, numbness, weakness.  PSYCHIATRY: No anxiety or depression.   DRUG ALLERGIES:  No Known Allergies  VITALS:  Blood pressure 119/71, pulse 76, temperature 98.4 F (36.9 C), temperature source Oral, resp. rate 16, height 5\' 10"  (1.778 m), weight 109.181 kg (240 lb 11.2 oz), SpO2 97 %.  PHYSICAL EXAMINATION:  GENERAL:  59 y.o.-year-old patient lying in the bed with no acute distress.  EYES: Pupils equal, round, reactive to light and accommodation. No scleral icterus. Extraocular muscles intact.  HEENT: Head atraumatic, normocephalic. Oropharynx and nasopharynx clear.  NECK:  Supple, no jugular venous distention. No thyroid enlargement, no tenderness.  LUNGS: Normal breath sounds bilaterally, no wheezing, rales,rhonchi or crepitation. No use of accessory muscles of respiration.  CARDIOVASCULAR: S1, S2 normal. No murmurs, rubs, or gallops.  ABDOMEN: Tenderness to palpation in the right and left lower quadrants. Bowel  sounds are present. Abdomen is slightly distended. EXTREMITIES: No pedal edema, cyanosis, or clubbing.  NEUROLOGIC: Cranial nerves II through XII are intact. Muscle strength 5/5 in all extremities. Sensation intact. Gait not checked.  PSYCHIATRIC: The patient is alert and oriented x 3.  SKIN: No obvious rash, lesion, or ulcer.    LABORATORY PANEL:   CBC  Recent Labs Lab 04/27/15 0522  WBC 6.9  HGB 13.6  HCT 39.6*  PLT 137*   ------------------------------------------------------------------------------------------------------------------  Chemistries   Recent Labs Lab 04/23/15 1252  04/27/15 0522  NA 138  < > 143  K 4.0  < > 4.0  CL 101  < > 108  CO2 28  < > 31  GLUCOSE 82  < > 92  BUN 13  < > 7  CREATININE 0.87  < > 0.77  CALCIUM 8.9  < > 8.5*  AST 18  --   --   ALT 13*  --   --   ALKPHOS 59  --   --   BILITOT 1.4*  --   --   < > = values in this interval not displayed. ------------------------------------------------------------------------------------------------------------------  Cardiac Enzymes No results for input(s): TROPONINI in the last 168 hours. ------------------------------------------------------------------------------------------------------------------  RADIOLOGY:  No results found.  EKG:   Orders placed or performed during the hospital encounter of 01/08/15  . ED EKG  . ED EKG  . EKG 12-Lead  . EKG 12-Lead  . EKG 12-Lead  . EKG 12-Lead  . EKG    ASSESSMENT AND PLAN:   #1.cdif Colitis: Continue contact isolation,; continue Flagyl. And also continue probiotic. Check stool for guaiac secondary to blood in the stool. #2 catheter related  UTI: Klebsiella UTI: Sensitive to Rocephin; continue that.   #4 BPH continue Flomax. 5 urine retention  S/p SPC D/w family  OOB to chair . All the records are reviewed and case discussed with Care Management/Social Workerr. Management plans discussed with the patient, family and they are in  agreement.  CODE STATUS: full  TOTAL TIME TAKING CARE OF THIS PATIENT:35 minutes.   POSSIBLE D/C IN 1-2 DAYS, DEPENDING ON CLINICAL CONDITION.   Epifanio Lesches M.D on 04/27/2015 at 11:31 AM  Between 7am to 6pm - Pager - [phone]  After 6pm go to [email or web address] - password [email or web address] [facility]  Office  [phone]  CC: Primary care physician; Christie Nottingham., PA   Note: This dictation was prepared with Dragon dictation along with smaller phrase technology. Any transcriptional errors that result from this process are unintentional.

## 2015-04-28 ENCOUNTER — Inpatient Hospital Stay: Payer: Self-pay

## 2015-04-28 ENCOUNTER — Encounter: Payer: Self-pay | Admitting: Radiology

## 2015-04-28 MED ORDER — OXYCODONE-ACETAMINOPHEN 5-325 MG PO TABS
2.0000 | ORAL_TABLET | Freq: Four times a day (QID) | ORAL | Status: DC | PRN
  Administered 2015-04-28 – 2015-04-29 (×4): 2 via ORAL
  Filled 2015-04-28 (×4): qty 2

## 2015-04-28 MED ORDER — IOHEXOL 350 MG/ML SOLN
125.0000 mL | Freq: Once | INTRAVENOUS | Status: AC | PRN
  Administered 2015-04-28: 125 mL via INTRAVENOUS

## 2015-04-28 MED ORDER — IOHEXOL 240 MG/ML SOLN
25.0000 mL | INTRAMUSCULAR | Status: AC
  Administered 2015-04-28 (×2): 25 mL via ORAL

## 2015-04-28 NOTE — Progress Notes (Signed)
[place] at [place] NAME: Gerald Odonnell    MR#:  [id number]  DATE OF BIRTH:  10/17/1964  SUBJECTIVE: admitted for C. difficile colitis, UTI. He complains of abdominal pain mainly in the right and left lower quadrants. No nausea, no vomiting, tolerating the diet. Patient says that he had blood in the urine rather than blood in stool. Yesterday he said he had blood in stool  but later he told the nurse that he had blood in the urine not in the stool.   CHIEF COMPLAINT:   Chief Complaint  Patient presents with  . Abdominal Pain    REVIEW OF SYSTEMS:   Review of Systems  Constitutional: Negative for fever and chills.  HENT: Negative for hearing loss.   Eyes: Negative for blurred vision, double vision and photophobia.  Respiratory: Negative for cough, hemoptysis and shortness of breath.   Cardiovascular: Negative for palpitations, orthopnea and leg swelling.  Gastrointestinal: Positive for abdominal pain. Negative for vomiting and diarrhea.  Genitourinary: Negative for dysuria and urgency.  Musculoskeletal: Negative for myalgias and neck pain.  Skin: Negative for rash.  Neurological: Negative for dizziness, focal weakness, seizures, weakness and headaches.  Psychiatric/Behavioral: Negative for memory loss. The patient does not have insomnia.      DRUG ALLERGIES:  No Known Allergies  VITALS:  Blood pressure 133/76, pulse 79, temperature 98.3 F (36.8 C), temperature source Oral, resp. rate 16, height 5\' 10"  (1.778 m), weight 109.181 kg (240 lb 11.2 oz), SpO2 97 %.  PHYSICAL EXAMINATION:  GENERAL:  60 y.o.-year-old patient lying in the bed with no acute distress.  EYES: Pupils equal, round, reactive to light and accommodation. No scleral icterus. Extraocular muscles intact.  HEENT: Head atraumatic, normocephalic. Oropharynx and nasopharynx clear.  NECK:  Supple, no jugular venous distention. No thyroid enlargement, no tenderness.   LUNGS: Normal breath sounds bilaterally, no wheezing, rales,rhonchi or crepitation. No use of accessory muscles of respiration.  CARDIOVASCULAR: S1, S2 normal. No murmurs, rubs, or gallops.  ABDOMEN: Tenderness to palpation in the right and left lower quadrants. Bowel sounds are present. BS present. EXTREMITIES: No pedal edema, cyanosis, or clubbing.  NEUROLOGIC: Cranial nerves II through XII are intact. Muscle strength 5/5 in all extremities. Sensation intact. Gait not checked.  PSYCHIATRIC: The patient is alert and oriented x 3.  SKIN: No obvious rash, lesion, or ulcer.    LABORATORY PANEL:   CBC  Recent Labs Lab 04/27/15 0522  WBC 6.9  HGB 13.6  HCT 39.6*  PLT 137*   ------------------------------------------------------------------------------------------------------------------  Chemistries   Recent Labs Lab 04/23/15 1252  04/27/15 0522  NA 138  < > 143  K 4.0  < > 4.0  CL 101  < > 108  CO2 28  < > 31  GLUCOSE 82  < > 92  BUN 13  < > 7  CREATININE 0.87  < > 0.77  CALCIUM 8.9  < > 8.5*  AST 18  --   --   ALT 13*  --   --   ALKPHOS 59  --   --   BILITOT 1.4*  --   --   < > = values in this interval not displayed. ------------------------------------------------------------------------------------------------------------------  Cardiac Enzymes No results for input(s): TROPONINI in the last 168 hours. ------------------------------------------------------------------------------------------------------------------  RADIOLOGY:  No results found.  EKG:   Orders placed or performed during the hospital encounter of 01/08/15  . ED EKG  . ED EKG  .  EKG 12-Lead  . EKG 12-Lead  . EKG 12-Lead  . EKG 12-Lead  . EKG    ASSESSMENT AND PLAN:   #1.cdif Colitis: The enteric precautions. continue Flagyl. And also continue probiotic. Abdominal CAT scan with contrast today to evaluate for the colitis. Continue IV pain medicine. Start Percocet.  #2 catheter related  UTI: Klebsiella UTI: Sensitive to Rocephin; continue that.   #4 BPH continue Flomax. 5 urine retention  S/p SPC D/w family  OOB to chair . All the records are reviewed and case discussed with Care Management/Social Workerr. Management plans discussed with the patient, family and they are in agreement.  CODE STATUS: full  TOTAL TIME TAKING CARE OF THIS PATIENT:35 minutes.   POSSIBLE D/C IN 1-2 DAYS, DEPENDING ON CLINICAL CONDITION.   Epifanio Lesches M.D on 04/28/2015 at 10:28 AM  Between 7am to 6pm - Pager - [phone]  After 6pm go to [email or web address] - password [email or web address] [facility]  Office  [phone]  CC: Primary care physician; Christie Nottingham., PA   Note: This dictation was prepared with Dragon dictation along with smaller phrase technology. Any transcriptional errors that result from this process are unintentional.

## 2015-04-29 MED ORDER — OXYCODONE-ACETAMINOPHEN 5-325 MG PO TABS: 2.0000 | ORAL_TABLET | Freq: Four times a day (QID) | ORAL | Status: DC | PRN

## 2015-04-29 MED ORDER — METRONIDAZOLE 500 MG PO TABS: 500.0000 mg | ORAL_TABLET | Freq: Three times a day (TID) | ORAL | Status: DC

## 2015-04-29 MED ORDER — TAMSULOSIN HCL 0.4 MG PO CAPS
0.4000 mg | ORAL_CAPSULE | Freq: Every day | ORAL | Status: DC
Start: 2015-04-29 — End: 2017-04-28

## 2015-04-29 MED ORDER — RISAQUAD PO CAPS: 2.0000 | ORAL_CAPSULE | Freq: Every day | ORAL | Status: DC

## 2015-04-29 MED ORDER — LEVOFLOXACIN 500 MG PO TABS
750.0000 mg | ORAL_TABLET | Freq: Every day | ORAL | Status: DC
  Administered 2015-04-29: 750 mg via ORAL
  Filled 2015-04-29: qty 1

## 2015-04-29 MED ORDER — LEVOFLOXACIN 750 MG PO TABS: 750.0000 mg | ORAL_TABLET | Freq: Every day | ORAL | Status: DC

## 2015-04-29 NOTE — Progress Notes (Signed)
ANTIBIOTIC CONSULT NOTE - INITIAL  Pharmacy Consult for Levaquin Indication: abdominal infection  No Known Allergies  Patient Measurements: Height: 5\' 10"  (177.8 cm) Weight: 240 lb 11.2 oz (109.181 kg) IBW/kg (Calculated) : 73 Adjusted Body Weight: na  Vital Signs: Temp: 97.9 F (36.6 C) (01/01 2104) Temp Source: Oral (01/01 2104) BP: 134/75 mmHg (01/01 2104) Pulse Rate: 76 (01/01 2104) Intake/Output from previous day: 01/01 0701 - 01/02 0700 In: [id number] [P.O.:240; IV Piggyback:424] Out: 3800 [Urine:3800] Intake/Output from this shift:    Labs:  Recent Labs  04/27/15 0522  WBC 6.9  HGB 13.6  PLT 137*  CREATININE 0.77   Estimated Creatinine Clearance: 121.5 mL/min (by C-G formula based on Cr of 0.77). No results for input(s): VANCOTROUGH, VANCOPEAK, VANCORANDOM, GENTTROUGH, GENTPEAK, GENTRANDOM, TOBRATROUGH, TOBRAPEAK, TOBRARND, AMIKACINPEAK, AMIKACINTROU, AMIKACIN in the last 72 hours.   Microbiology: Recent Results (from the past 720 hour(s))  Urine culture     Status: None   Collection Time: 04/23/15  3:02 PM  Result Value Ref Range Status   Specimen Description URINE, CATHETERIZED  Final   Special Requests NONE  Final   Culture >=100,000 COLONIES/mL KLEBSIELLA PNEUMONIAE  Final   Report Status 04/26/2015 FINAL  Final   Organism ID, Bacteria KLEBSIELLA PNEUMONIAE  Final      Susceptibility   Klebsiella pneumoniae - MIC*    AMPICILLIN >=32 RESISTANT Resistant     CEFTAZIDIME <=1 SENSITIVE Sensitive     CEFAZOLIN <=4 SENSITIVE Sensitive     CEFTRIAXONE <=1 SENSITIVE Sensitive     CIPROFLOXACIN <=0.25 SENSITIVE Sensitive     GENTAMICIN <=1 SENSITIVE Sensitive     IMIPENEM <=0.25 SENSITIVE Sensitive     TRIMETH/SULFA <=20 SENSITIVE Sensitive     Extended ESBL NEGATIVE Sensitive     PIP/TAZO Value in next row Sensitive      SENSITIVE8    ERTAPENEM Value in next row Sensitive      SENSITIVE<=0.5    LEVOFLOXACIN Value in next row Sensitive      SENSITIVE<=0.12     NITROFURANTOIN Value in next row Sensitive      SENSITIVE32    * >=100,000 COLONIES/mL KLEBSIELLA PNEUMONIAE  Gastrointestinal Panel by PCR , Stool     Status: None   Collection Time: 04/23/15  3:56 PM  Result Value Ref Range Status   Campylobacter species NOT DETECTED NOT DETECTED Final   Plesimonas shigelloides NOT DETECTED NOT DETECTED Final   Salmonella species NOT DETECTED NOT DETECTED Final   Yersinia enterocolitica NOT DETECTED NOT DETECTED Final   Vibrio species NOT DETECTED NOT DETECTED Final   Vibrio cholerae NOT DETECTED NOT DETECTED Final   Enteroaggregative E coli (EAEC) NOT DETECTED NOT DETECTED Final   Enteropathogenic E coli (EPEC) NOT DETECTED NOT DETECTED Final   Enterotoxigenic E coli (ETEC) NOT DETECTED NOT DETECTED Final   Shiga like toxin producing E coli (STEC) NOT DETECTED NOT DETECTED Final   E. coli O157 NOT DETECTED NOT DETECTED Final   Shigella/Enteroinvasive E coli (EIEC) NOT DETECTED NOT DETECTED Final   Cryptosporidium NOT DETECTED NOT DETECTED Final   Cyclospora cayetanensis NOT DETECTED NOT DETECTED Final   Entamoeba histolytica NOT DETECTED NOT DETECTED Final   Giardia lamblia NOT DETECTED NOT DETECTED Final   Adenovirus F40/41 NOT DETECTED NOT DETECTED Final   Astrovirus NOT DETECTED NOT DETECTED Final   Norovirus GI/GII NOT DETECTED NOT DETECTED Final   Rotavirus A NOT DETECTED NOT DETECTED Final   Sapovirus (I, II, IV, and V) NOT DETECTED  NOT DETECTED Final  C difficile quick scan w PCR reflex     Status: Abnormal   Collection Time: 04/23/15  3:58 PM  Result Value Ref Range Status   C Diff antigen POSITIVE (A) NEGATIVE Final   C Diff toxin POSITIVE (A) NEGATIVE Final   C Diff interpretation   Final    Positive for toxigenic C. difficile, active toxin production present.    Comment: CRITICAL RESULT CALLED TO, READ BACK BY AND VERIFIED WITH: Ardean Larsen RN AT 1940     Medical History: Past Medical History  Diagnosis Date  . Urinary  retention   . Shortness of breath dyspnea   . Cancer ([place])     colon  . S/P colon resection   . Hypertension     Medications:  Anti-infectives    Start     Dose/Rate Route Frequency Ordered Stop   04/29/15 1000  levofloxacin (LEVAQUIN) tablet 750 mg     750 mg Oral Daily 04/29/15 0845     04/27/15 2200  metroNIDAZOLE (FLAGYL) IVPB 500 mg     500 mg 100 mL/hr over 60 Minutes Intravenous Every 8 hours 04/27/15 2022     04/24/15 1500  metroNIDAZOLE (FLAGYL) IVPB 500 mg  Status:  Discontinued     500 mg 100 mL/hr over 60 Minutes Intravenous Every 8 hours 04/24/15 1426 04/27/15 2022   04/23/15 2100  vancomycin (VANCOCIN) 50 mg/mL oral solution 250 mg  Status:  Discontinued     250 mg Oral 4 times per day 04/23/15 2007 04/24/15 1426   04/23/15 2015  cefTRIAXone (ROCEPHIN) 1 g in dextrose 5 % 50 mL IVPB  Status:  Discontinued     1 g 100 mL/hr over 30 Minutes Intravenous Every 24 hours 04/23/15 2007 04/29/15 [id number]     Assessment: Patient is a 60yo male admitted with C. Diff and UTI. Initially started on Flagyl 500mg  IV q8h and Ceftriaxone 1g IV q24h. Patient still complaining of abdominal pain. CT scan shows possible diverticulitis. Ceftriaxone has been discontinued and pharmacy consulted to initiate Levaquin.  Goal of Therapy:  Resolution of symptoms  Plan:  Will order Levaquin 750mg  PO daily.  Paulina Fusi, PharmD, BCPS 04/29/2015 8:49 AM

## 2015-04-29 NOTE — Discharge Summary (Signed)
Gerald Odonnell, is a 60 y.o. male  DOB 10/18/1954  MRN [id number].  Admission date:  04/23/2015  Admitting Physician  Epifanio Lesches, MD  Discharge Date:  04/29/2015   Primary MD  Christie Nottingham., PA  Recommendations for primary care physician for things to follow:   Patient is to follow-up with primary doctor in 1 week.   Admission Diagnosis  Clostridium difficile diarrhea [A04.7]   Discharge Diagnosis  Clostridium difficile diarrhea [A04.7]    Active Problems:   Colitis, infectious   Clostridium difficile diarrhea      Past Medical History  Diagnosis Date  . Urinary retention   . Shortness of breath dyspnea   . Cancer ([place])     colon  . S/P colon resection   . Hypertension     Past Surgical History  Procedure Laterality Date  . Abdominal surgery    . Suprapubic catheter insertion         History of present illness and  Hospital Course:     Kindly see H&P for history of present illness and admission details, please review complete Labs, Consult reports and Test reports for all details in brief  HPI  from the history and physical done on the day of admission -year-old male patient with history of hypertension, BPH admitted because of. Diarrhea for 15 days. Also had abdominal pain. Admitted for UTI and also C. difficile colitis. Patient  Had [facility] placed BY [name]  In Oct 2016.   Hospital Course  1 diarrhea secondary to C. difficile colitis: Patient received vancomycin initially but because of present diarrhea changed to Flagyl 500 mg every 8 hours along with the lactobacillus. Diarrhea improved .Marland Kitchen Discharge home with Flagyl 500 mg every 8 hours for 10 days. And also was given lactobacillus prescription. Advised the patient to follow with primary doctor in 1 week follow up on the C. difficile  colitis. Abdominal pain improved with IV morphine and  gave limited  prescription for Percocet. Abdominal pain much better today. #2 catheter related UTI, patient had klebsiella UTI and urine cultures. Sensitive to Levaquin. Patient initially received Rocephin, I change antibiotic Levaquin to cover for UTI and also possible colitis. #3. colitis with abdominal pain. Abdominal CAT scan showed left-sided colitis. Given Levaquin at this time. Patient also seen by surgery because of concern for appendicitis seen on the CAT scan. Surgery felt that his appendicitis is not inflamed instructed abdominal pain secondary to colitis and also UTI   #4 urinary retention: Status post suprapubic catheter. Follows up with Dr. Tyrone Sage..   Discharge Condition: stable,   Follow UP  Follow-up Information    Follow up with Christie Nottingham., PA In 1 week.   Specialty:  Physician Assistant   Contact information:   [place] [address] [phone]         Discharge Instructions  and  Discharge Medications        Medication List    TAKE these medications        acidophilus Caps capsule  Take 2 capsules by mouth daily.     hydrochlorothiazide 12.5 MG capsule  Commonly known as:  MICROZIDE  Take 12.5 mg by mouth daily.     levofloxacin 750 MG tablet  Commonly known as:  LEVAQUIN  Take 1 tablet (750 mg total) by mouth daily.     metroNIDAZOLE 500 MG tablet  Commonly known as:  FLAGYL  Take 1 tablet (500 mg total) by mouth 3 (  three) times daily.     oxyCODONE-acetaminophen 5-325 MG tablet  Commonly known as:  PERCOCET/ROXICET  Take 2 tablets by mouth every 6 (six) hours as needed for moderate pain.     tamsulosin 0.4 MG Caps capsule  Commonly known as:  FLOMAX  Take 1 capsule (0.4 mg total) by mouth daily.          Diet and Activity recommendation: See Discharge Instructions above   Consults obtained - surgery   Major procedures and Radiology Reports - PLEASE review  detailed and final reports for all details, in brief -      Ct Abdomen Pelvis W Contrast  04/28/2015  CLINICAL DATA:  Colon cancer with lower quadrant pain and bloody stools. Positive for C. diff. EXAM: CT ABDOMEN AND PELVIS WITH CONTRAST TECHNIQUE: Multidetector CT imaging of the abdomen and pelvis was performed using the standard protocol following bolus administration of intravenous contrast. CONTRAST:  133mL OMNIPAQUE IOHEXOL 350 MG/ML SOLN COMPARISON:  04/23/2015 FINDINGS: Lower chest:  Unremarkable Hepatobiliary: 10 mm lesion with irregular peripheral enhancement in the dome of the liver (image 12 series 2) was present previously. This may be a cavernous hemangioma. A new 6 mm hypo attenuating lesion is identified in the inferior right liver (image 37 series 2). There is no evidence for gallstones, gallbladder wall thickening, or pericholecystic fluid. No intrahepatic or extrahepatic biliary dilation. Pancreas: No focal mass lesion. No dilatation of the main duct. No intraparenchymal cyst. No peripancreatic edema. Spleen: No splenomegaly. No focal mass lesion. Adrenals/Urinary Tract: No adrenal nodule or mass. Kidneys are unremarkable. No evidence for hydroureter. Bladder decompressed by suprapubic tube. Stomach/Bowel: Stomach is nondistended. No gastric wall thickening. No evidence of outlet obstruction. Duodenum is normally positioned as is the ligament of Treitz. No small bowel wall thickening. No small bowel dilatation. The terminal ileum is normal. The appendix is stable in appearance and remains dilated at 11 mm diameter. There is diverticular changes in the left colon. Subtle pericolonic edema/ inflammation is seen in the region of the proximal sigmoid segment, potentially related to diverticulitis. Patient has a reported history of C diff , but no substantial colonic wall thickening is evident. Vascular/Lymphatic: There is abdominal aortic atherosclerosis without aneurysm. The mild hepatoduodenal  ligament lymphadenopathy persists with a 16 mm short axis lymph node seen on image 22 and an 18 mm short axis lymph node seen on image 20. Reproductive: Prostate gland is mildly enlarged. Other: No intraperitoneal free fluid. Musculoskeletal: Bilateral inguinal hernias contain only fat. Bone windows reveal no worrisome lytic or sclerotic osseous lesions. IMPRESSION: 1. Subtle pericolonic edema/inflammation in the proximal sigmoid segment on a background of diverticulosis. Imaging features raise the question of diverticulitis. 2. No substantial colonic wall thickening in this patient with a history of C. diff infection. 3. Stable appearance of the dilated appendix at 11 mm. Given the stability of this finding over 5 days without substantial periappendiceal edema or inflammation, the dilatation is considered indeterminate. As mucocele of the appendix can have similar imaging features, close followup is recommended after resolution of acute symptoms. 4. 10 mm lesion in the dome of the liver with irregular peripheral enhancement. Imaging features are nonspecific in this is not been well seen on previous imaging studies. Potentially could represent cavernous hemangioma, but follow-up is recommended. MRI without and with contrast may prove helpful to further evaluate. Electronically Signed   By: Misty Stanley M.D.   On: 04/28/2015 15:53   Ct Abdomen Pelvis W Contrast  04/23/2015  CLINICAL DATA:  Lower abdominal pain for 2 weeks. EXAM: CT ABDOMEN AND PELVIS WITH CONTRAST TECHNIQUE: Multidetector CT imaging of the abdomen and pelvis was performed using the standard protocol following bolus administration of intravenous contrast. CONTRAST:  124mL OMNIPAQUE IOHEXOL 350 MG/ML SOLN COMPARISON:  CT scan of January 08, 2015. FINDINGS: Visualized lung bases are unremarkable. No significant osseous abnormality is noted. No gallstones are noted. The liver, spleen and pancreas appear normal. Left adrenal gland and kidneys are  unremarkable. 2.1 x 1.3 cm right adrenal nodule is noted which is not significantly changed compared to prior exam. No hydronephrosis or renal obstruction is noted. No renal or ureteral calculi are noted. Surgical clips are noted in the left upper quadrant. Mild inflammatory changes are noted around the cecum. The appendix is significantly enlarged compared to prior exam, currently measuring 12 mm in diameter. There is no evidence of bowel obstruction. Bladder is decompressed secondary to suprapubic catheter placement. Minimal sigmoid diverticulitis may be present. No abnormal fluid collection is noted. Stable enlarged adenopathy is noted in the porta hepatis region. IMPRESSION: Grossly stable right adrenal nodule is noted. Further evaluation with MRI is recommended given the history of colon cancer. Mild inflammatory changes are noted around the cecum which may represent colitis or typhlitis. The appendix is significantly enlarged since prior exam, now measuring 12 mm in diameter. However, no definite inflammation is noted around the cecum. These findings are concerning for possible appendicitis, and correlation with clinical laboratory findings is recommended. Possible minimal proximal sigmoid diverticulitis is noted. Interval placement of suprapubic catheter, with decompression of the urinary bladder. Electronically Signed   By: [name], M.D.   On: 04/23/2015 16:12    Micro Results     Recent Results (from the past 240 hour(s))  Urine culture     Status: None   Collection Time: 04/23/15  3:02 PM  Result Value Ref Range Status   Specimen Description URINE, CATHETERIZED  Final   Special Requests NONE  Final   Culture >=100,000 COLONIES/mL KLEBSIELLA PNEUMONIAE  Final   Report Status 04/26/2015 FINAL  Final   Organism ID, Bacteria KLEBSIELLA PNEUMONIAE  Final      Susceptibility   Klebsiella pneumoniae - MIC*    AMPICILLIN >=32 RESISTANT Resistant     CEFTAZIDIME <=1 SENSITIVE Sensitive      CEFAZOLIN <=4 SENSITIVE Sensitive     CEFTRIAXONE <=1 SENSITIVE Sensitive     CIPROFLOXACIN <=0.25 SENSITIVE Sensitive     GENTAMICIN <=1 SENSITIVE Sensitive     IMIPENEM <=0.25 SENSITIVE Sensitive     TRIMETH/SULFA <=20 SENSITIVE Sensitive     Extended ESBL NEGATIVE Sensitive     PIP/TAZO Value in next row Sensitive      SENSITIVE8    ERTAPENEM Value in next row Sensitive      SENSITIVE<=0.5    LEVOFLOXACIN Value in next row Sensitive      SENSITIVE<=0.12    NITROFURANTOIN Value in next row Sensitive      SENSITIVE32    * >=100,000 COLONIES/mL KLEBSIELLA PNEUMONIAE  Gastrointestinal Panel by PCR , Stool     Status: None   Collection Time: 04/23/15  3:56 PM  Result Value Ref Range Status   Campylobacter species NOT DETECTED NOT DETECTED Final   Plesimonas shigelloides NOT DETECTED NOT DETECTED Final   Salmonella species NOT DETECTED NOT DETECTED Final   Yersinia enterocolitica NOT DETECTED NOT DETECTED Final   Vibrio species NOT DETECTED NOT DETECTED Final   Vibrio cholerae NOT  DETECTED NOT DETECTED Final   Enteroaggregative E coli (EAEC) NOT DETECTED NOT DETECTED Final   Enteropathogenic E coli (EPEC) NOT DETECTED NOT DETECTED Final   Enterotoxigenic E coli (ETEC) NOT DETECTED NOT DETECTED Final   Shiga like toxin producing E coli (STEC) NOT DETECTED NOT DETECTED Final   E. coli O157 NOT DETECTED NOT DETECTED Final   Shigella/Enteroinvasive E coli (EIEC) NOT DETECTED NOT DETECTED Final   Cryptosporidium NOT DETECTED NOT DETECTED Final   Cyclospora cayetanensis NOT DETECTED NOT DETECTED Final   Entamoeba histolytica NOT DETECTED NOT DETECTED Final   Giardia lamblia NOT DETECTED NOT DETECTED Final   Adenovirus F40/41 NOT DETECTED NOT DETECTED Final   Astrovirus NOT DETECTED NOT DETECTED Final   Norovirus GI/GII NOT DETECTED NOT DETECTED Final   Rotavirus A NOT DETECTED NOT DETECTED Final   Sapovirus (I, II, IV, and V) NOT DETECTED NOT DETECTED Final  C difficile quick scan  w PCR reflex     Status: Abnormal   Collection Time: 04/23/15  3:58 PM  Result Value Ref Range Status   C Diff antigen POSITIVE (A) NEGATIVE Final   C Diff toxin POSITIVE (A) NEGATIVE Final   C Diff interpretation   Final    Positive for toxigenic C. difficile, active toxin production present.    Comment: CRITICAL RESULT CALLED TO, READ BACK BY AND VERIFIED WITH: Ardean Larsen RN AT 1940        Today   Subjective:   Gerald Odonnell today has no headache,no chest abdominal pain,no new weakness tingling or numbness, feels much better wants to go home today.   Objective:   Blood pressure 136/70, pulse 78, temperature 98.2 F (36.8 C), temperature source Oral, resp. rate 18, height 5\' 10"  (1.778 m), weight 109.181 kg (240 lb 11.2 oz), SpO2 100 %.   Intake/Output Summary (Last 24 hours) at 04/29/15 1320 Last data filed at 04/29/15 0200  Gross per 24 hour  Intake    329 ml  Output   1400 ml  Net  -1071 ml    Exam Awake Alert, Oriented x 3, No new F.N deficits, Normal affect [place].AT,PERRAL Supple Neck,No JVD, No cervical lymphadenopathy appriciated.  Symmetrical Chest wall movement, Good air movement bilaterally, CTAB RRR,No Gallops,Rubs or new Murmurs, No Parasternal Heave +ve B.Sounds, Abd Soft, Non tender, No organomegaly appriciated, No rebound -guarding or rigidity. No Cyanosis, Clubbing or edema, No new Rash or bruise  Data Review   CBC w Diff: Lab Results  Component Value Date   WBC 6.9 04/27/2015   WBC 5.9 05/18/2013   HGB 13.6 04/27/2015   HGB 14.2 05/18/2013   HCT 39.6* 04/27/2015   HCT 41.8 05/18/2013   PLT 137* 04/27/2015   PLT 139* 05/18/2013   LYMPHOPCT 11 04/23/2015   LYMPHOPCT 25.5 05/18/2013   MONOPCT 11 04/23/2015   MONOPCT 8.9 05/18/2013   EOSPCT 0 04/23/2015   EOSPCT 3.5 05/18/2013   BASOPCT 1 04/23/2015   BASOPCT 1.5 05/18/2013    CMP: Lab Results  Component Value Date   NA 143 04/27/2015   NA 139 11/01/2013   K 4.0 04/27/2015   K 4.0  11/01/2013   CL 108 04/27/2015   CL 103 11/01/2013   CO2 31 04/27/2015   CO2 29 11/01/2013   BUN 7 04/27/2015   BUN 11 11/01/2013   CREATININE 0.77 04/27/2015   CREATININE 0.85 11/01/2013   PROT 6.8 04/23/2015   PROT 7.5 11/01/2013   ALBUMIN 3.6 04/23/2015   ALBUMIN 3.9  11/01/2013   BILITOT 1.4* 04/23/2015   BILITOT 0.6 11/01/2013   ALKPHOS 59 04/23/2015   ALKPHOS 84 11/01/2013   AST 18 04/23/2015   AST 24 11/01/2013   ALT 13* 04/23/2015   ALT 24 11/01/2013  .   Total Time in preparing paper work, data evaluation and todays exam - 56 minutes  Davan Nawabi M.D on 04/29/2015 at 1:20 PM    Note: This dictation was prepared with Dragon dictation along with smaller phrase technology. Any transcriptional errors that result from this process are unintentional.

## 2015-04-29 NOTE — Progress Notes (Signed)
PHARMACIST - PHYSICIAN COMMUNICATION DR:   Vianne Bulls CONCERNING: Antibiotic IV to Oral Route Change Policy  RECOMMENDATION: This patient is receiving Metronidazole by the intravenous route.  Based on criteria approved by the Pharmacy and Therapeutics Committee, the antibiotic(s) is/are being converted to the equivalent oral dose form(s).   DESCRIPTION: These criteria include:  Patient being treated for a respiratory tract infection, urinary tract infection, cellulitis or clostridium difficile associated diarrhea if on metronidazole  The patient is not neutropenic and does not exhibit a GI malabsorption state  The patient is eating (either orally or via tube) and/or has been taking other orally administered medications for a least 24 hours  The patient is improving clinically and has a Tmax < 100.5  If you have questions about this conversion, please contact the Pharmacy Department  []   [phone] )  [facility] [x]   [phone] )  [facility] []   [phone] )  [facility] []   [phone] )  [facility] []   [phone] )  [facility]

## 2015-04-29 NOTE — Progress Notes (Signed)
Initial Nutrition Assessment     INTERVENTION:  Meals and snacks: Cater to pt preferences. Recommend low residue diet secondary to c-diff colitis and not DM    NUTRITION DIAGNOSIS:   Inadequate oral intake related to altered GI function as evidenced by meal completion < 50%.    GOAL:   Patient will meet greater than or equal to 90% of their needs    MONITOR:    (Energy intake, Digestive system)  REASON FOR ASSESSMENT:   LOS    ASSESSMENT:      Pt admitted with c-diff colitis and UTI  Past Medical History  Diagnosis Date  . Urinary retention   . Shortness of breath dyspnea   . Cancer ([place])     colon  . S/P colon resection   . Hypertension     Current Nutrition: eating 75- 100% of meals and tolerating well  Food/Nutrition-Related History: pt reports prior to admission poor po intake for about 15 days   Scheduled Medications:  . acidophilus  2 capsule Oral Daily  . enoxaparin (LOVENOX) injection  40 mg Subcutaneous Q24H  . levofloxacin  750 mg Oral Daily  . metronidazole  500 mg Intravenous Q8H  . tamsulosin  0.4 mg Oral Daily      Electrolyte/Renal Profile and Glucose Profile:   Recent Labs Lab 04/23/15 1252 04/24/15 0516 04/27/15 0522  NA 138 136 143  K 4.0 3.7 4.0  CL 101 104 108  CO2 28 27 31   BUN 13 10 7   CREATININE 0.87 0.87 0.77  CALCIUM 8.9 8.1* 8.5*  GLUCOSE 82 78 92   Protein Profile:  Recent Labs Lab 04/23/15 1252  ALBUMIN 3.6    Gastrointestinal Profile: Last BM: 1/01   Unable to complete Nutrition-Focused physical exam at this time.    Weight Change: 2% wt loss in the last month    Diet Order:  Diet Carb Modified Fluid consistency:: Thin; Room service appropriate?: Yes  Skin:   reviewed   Height:   Ht Readings from Last 1 Encounters:  04/23/15 5\' 10"  (1.778 m)    Weight:   Wt Readings from Last 1 Encounters:  04/23/15 240 lb 11.2 oz (109.181 kg)    Ideal Body Weight:     BMI:  Body mass index is  34.54 kg/(m^2).  Estimated Nutritional Needs:   Kcal:  BEE 1566 kcals (IF 1.0-1.3, AF 1.3) [id number] kcals/d.   Protein:  (1.0-1.3 g/kg) 75-97 g/d  Fluid:  (25-75ml/kg) 1875-2254ml/d  EDUCATION NEEDS:   No education needs identified at this time  [place]. Zenia Resides, [place], [place] (pager) Weekend/On-Call pager [phone])

## 2015-04-30 ENCOUNTER — Other Ambulatory Visit: Payer: Self-pay

## 2015-06-08 DIAGNOSIS — N312 Flaccid neuropathic bladder, not elsewhere classified: Secondary | ICD-10-CM | POA: Insufficient documentation

## 2015-06-13 ENCOUNTER — Other Ambulatory Visit: Payer: Self-pay | Admitting: Urology

## 2015-06-13 DIAGNOSIS — R339 Retention of urine, unspecified: Secondary | ICD-10-CM

## 2015-06-17 ENCOUNTER — Ambulatory Visit: Payer: Self-pay

## 2015-06-21 ENCOUNTER — Ambulatory Visit
Admission: RE | Admit: 2015-06-21 | Discharge: 2015-06-21 | Disposition: A | Discharge status: Home / Self Care | Payer: Self-pay | Source: Ambulatory Visit | Attending: Urology | Admitting: Urology

## 2015-06-21 DIAGNOSIS — I10 Essential (primary) hypertension: Secondary | ICD-10-CM | POA: Insufficient documentation

## 2015-06-21 DIAGNOSIS — R339 Retention of urine, unspecified: Secondary | ICD-10-CM | POA: Insufficient documentation

## 2015-06-21 DIAGNOSIS — Z9889 Other specified postprocedural states: Secondary | ICD-10-CM | POA: Insufficient documentation

## 2015-06-21 DIAGNOSIS — Z79891 Long term (current) use of opiate analgesic: Secondary | ICD-10-CM | POA: Insufficient documentation

## 2015-06-21 DIAGNOSIS — N320 Bladder-neck obstruction: Secondary | ICD-10-CM | POA: Insufficient documentation

## 2015-06-21 DIAGNOSIS — Z4682 Encounter for fitting and adjustment of non-vascular catheter: Secondary | ICD-10-CM | POA: Insufficient documentation

## 2015-06-21 DIAGNOSIS — F17210 Nicotine dependence, cigarettes, uncomplicated: Secondary | ICD-10-CM | POA: Insufficient documentation

## 2015-06-21 DIAGNOSIS — Z9049 Acquired absence of other specified parts of digestive tract: Secondary | ICD-10-CM | POA: Insufficient documentation

## 2015-06-21 DIAGNOSIS — Z79899 Other long term (current) drug therapy: Secondary | ICD-10-CM | POA: Insufficient documentation

## 2015-06-21 DIAGNOSIS — Z85038 Personal history of other malignant neoplasm of large intestine: Secondary | ICD-10-CM | POA: Insufficient documentation

## 2015-06-21 MED ORDER — FENTANYL CITRATE (PF) 100 MCG/2ML IJ SOLN
INTRAMUSCULAR | Status: AC
  Filled 2015-06-21: qty 2

## 2015-06-21 MED ORDER — MIDAZOLAM HCL 5 MG/5ML IJ SOLN
INTRAMUSCULAR | Status: AC
  Filled 2015-06-21: qty 10

## 2015-06-21 MED ORDER — SODIUM CHLORIDE 0.9 % IV SOLN
INTRAVENOUS | Status: DC
  Administered 2015-06-21: 10:00:00 via INTRAVENOUS

## 2015-06-21 MED ORDER — MIDAZOLAM HCL 5 MG/5ML IJ SOLN
INTRAMUSCULAR | Status: AC | PRN
  Administered 2015-06-21: 2 mg via INTRAVENOUS
  Administered 2015-06-21: 1 mg via INTRAVENOUS
  Administered 2015-06-21 (×2): 0.5 mg via INTRAVENOUS

## 2015-06-21 MED ORDER — FENTANYL CITRATE (PF) 100 MCG/2ML IJ SOLN
INTRAMUSCULAR | Status: AC | PRN
  Administered 2015-06-21: 25 ug via INTRAVENOUS
  Administered 2015-06-21: 50 ug via INTRAVENOUS
  Administered 2015-06-21: 25 ug via INTRAVENOUS

## 2015-06-21 MED ORDER — IOHEXOL 300 MG/ML  SOLN
30.0000 mL | Freq: Once | INTRAMUSCULAR | Status: AC | PRN
  Administered 2015-06-21: 5 mL

## 2015-06-21 NOTE — H&P (Signed)
Chief Complaint: Bladder outlet obstruction, supra-pubic catheter.   Referring Physician(s): Stoioff,Scott C  Corrie Mckusick  History of Present Illness: Gerald Odonnell is a 60 y.o. male presenting for image-guided exchange of supra-pubic cathter.    He had had prior upsize from 50F pigtail to 71F pigtail 03/08/2015.      Past Medical History  Diagnosis Date  . Urinary retention   . Shortness of breath dyspnea   . Cancer ([place])     colon  . S/P colon resection   . Hypertension     Past Surgical History  Procedure Laterality Date  . Abdominal surgery    . Suprapubic catheter insertion      Allergies: Review of patient's allergies indicates no known allergies.  Medications: Prior to Admission medications   Medication Sig Start Date End Date Taking? Authorizing Provider  tamsulosin (FLOMAX) 0.4 MG CAPS capsule Take 1 capsule (0.4 mg total) by mouth daily. 04/29/15  Yes Epifanio Lesches, MD  traMADol (ULTRAM) 50 MG tablet Take by mouth every 6 (six) hours as needed.   Yes Historical Provider, MD  acidophilus (RISAQUAD) CAPS capsule Take 2 capsules by mouth daily. Patient not taking: Reported on 06/21/2015 04/29/15   Epifanio Lesches, MD  hydrochlorothiazide (MICROZIDE) 12.5 MG capsule Take 12.5 mg by mouth daily. Reported on 06/21/2015    Historical Provider, MD  levofloxacin (LEVAQUIN) 750 MG tablet Take 1 tablet (750 mg total) by mouth daily. Patient not taking: Reported on 06/21/2015 04/29/15   Epifanio Lesches, MD  metroNIDAZOLE (FLAGYL) 500 MG tablet Take 1 tablet (500 mg total) by mouth 3 (three) times daily. Patient not taking: Reported on 06/21/2015 04/29/15   Epifanio Lesches, MD  oxybutynin (DITROPAN) 5 MG tablet Take 5 mg by mouth 3 (three) times daily as needed. Reported on 06/21/2015 02/22/15   Historical Provider, MD  oxyCODONE-acetaminophen (PERCOCET/ROXICET) 5-325 MG tablet Take 2 tablets by mouth every 6 (six) hours as needed for moderate pain. Patient  not taking: Reported on 06/21/2015 04/29/15   Epifanio Lesches, MD     History reviewed. No pertinent family history.  Social History   Social History  . Marital Status: Married    Spouse Name: N/A  . Number of Children: N/A  . Years of Education: N/A   Social History Main Topics  . Smoking status: Current Every Day Smoker -- 0.50 packs/day    Types: Cigarettes  . Smokeless tobacco: None  . Alcohol Use: No  . Drug Use: None  . Sexual Activity: Not Asked   Other Topics Concern  . None   Social History Narrative       Review of Systems: A 12 point ROS discussed and pertinent positives are indicated in the HPI above.  All other systems are negative.  Review of Systems  Vital Signs: BP 157/95 mmHg  Pulse 96  Temp(Src) 98.2 F (36.8 C) (Oral)  Resp 19  Ht 5\' 11"  (1.803 m)  Wt 248 lb (112.492 kg)  BMI 34.60 kg/m2  SpO2 95%  Physical Exam  Mallampati Score: 2    Imaging: No results found.  Labs:  CBC:  Recent Labs  01/30/15 0827 04/23/15 1252 04/24/15 0516 04/27/15 0522  WBC 6.6 17.5* 14.1* 6.9  HGB 16.4 16.6 14.4 13.6  HCT 48.3 49.0 41.7 39.6*  PLT 138* 133* 100* 137*    COAGS:  Recent Labs  01/30/15 0827  INR 0.96  APTT 35    BMP:  Recent Labs  01/07/15 2357 04/23/15 1252 04/24/15  0516 04/27/15 0522  NA 136 138 136 143  K 3.8 4.0 3.7 4.0  CL 99* 101 104 108  CO2 25 28 27 31   GLUCOSE 123* 82 78 92  BUN 42* 13 10 7   CALCIUM 9.3 8.9 8.1* 8.5*  CREATININE 2.16* 0.87 0.87 0.77  GFRNONAA 31* >60 >60 >60  GFRAA 36* >60 >60 >60    LIVER FUNCTION TESTS:  Recent Labs  01/07/15 2357 04/23/15 1252  BILITOT 2.6* 1.4*  AST 37 18  ALT 25 13*  ALKPHOS 84 59  PROT 7.8 6.8  ALBUMIN 4.3 3.6    TUMOR MARKERS: No results for input(s): AFPTM, CEA, CA199, CHROMGRNA in the last 8760 hours.  Assessment and Plan:  60 year old male with bladder outlet obstruction, and indwelling supra-pubic catheter, presents for exchange to 38F  foley.  Will proceed.      Electronically Signed: Corrie Mckusick 06/21/2015, 10:15 AM

## 2015-06-21 NOTE — Discharge Instructions (Signed)
Suprapubic Catheter Home Guide °A suprapubic catheter is a rubber tube with a tiny balloon on the end. It is used to drain urine from the bladder. This catheter is put in your bladder through a small opening in the lower center part of your abdomen. Suprapubic refers to the area right above your pubic bone. °The balloon on the end of the catheter is filled with germ-free (sterile) water. This keeps the catheter from slipping out. When the catheter is in place, your urine will drain into a collection bag. The bag can be put beside your bed at night or attached to your leg during the day. °HOW TO CARE FOR YOUR CATHETER  °Cleaning your skin °· Clean the skin around the catheter opening every day. °¨ Wash your hands with soap and water. °¨ Clean the skin around the opening with a clean washcloth and soapy water. Do not pull on the tube. °¨ Pat the area dry with a clean towel. °· Your caregiver may want you to put a bandage (dressing) over the site. Do not use ointment on this area unless your caregiver tells you to. °Cleaning the catheter °· Ask your caregiver if you need to clean the catheter and how often. °· Use only soap and water. °· There may be crusts on the catheter. Put hydrogen peroxide on a cotton ball or gauze pad to remove any crust.  °Emptying the collection bag °· You may have a large drainage bag to use at night and a smaller one for daytime. Empty the large bag every 8 hours. Empty the small bag when it is about  full. °· Keep the drainage bag below the level of the catheter. This keeps urine from flowing backwards. °· Hold the bag over the toilet or another container. Release the valve (spigot) at the bottom of the bag. Do not touch the opening of the spigot. Do not let the opening touch the toilet or container. °· Close the spigot tightly when the bag is empty. °Cleaning the collection bag °· Clean the bag every few days. °¨ First, wash your hands. °¨ Disconnect the tubing from the catheter. Replace  the used bag with a new bag. Then you can clean the used one. °¨ Empty the used bag completely. Rinse it out with warm water and soap or fill the bag with water and add 1 teaspoon of vinegar. Let it sit for about 30 minutes. Then drain. °· The bag should be completely dry before storing it. Put it inside a plastic bag to keep it clean. °Checking everything °· Always make sure there are no kinks in the catheter or tubing. °· Always make sure there are no leaks in the catheter, tubing, or collection bag. °HOW TO CHANGE YOUR CATHETER °Sometimes, a caregiver will change your suprapubic catheter. Other times, you may need to change it yourself. This may be the case if you need to wear a catheter for a long time. Usually, they need to be changed every 4 to 6 weeks. Ask your caregiver how often yours should be changed. °Your caregiver will help you order the following supplies for home delivery: °· Sterile gloves. °· Catheters. °· Syringes. °· Sterile water. °· Sterile cleaning solution. °· Lubricant. °· Drainage bags. °Changing your catheter °· Drink plenty of fluids before changing the catheter. °· Wash your hands with soap and water. °· Lie on your back and put on sterile gloves. °· Clean the skin around the catheter opening. Use the sterile cleaning solution. °· Use   a syringe to get the water out of the balloon from the old catheter. °· Slowly remove the catheter. °· Take off the first pair of gloves, and put on a new pair. Then put lubricant on the tip of the new catheter. Put the new catheter through the opening. °· Wait for some urine to start flowing. Then, use the other syringe to fill the balloon with sterile water. °· Attach the catheter to your drainage bag. Make sure the connection is tight. °Important warnings °· The catheter should come out easily. If it seems stuck, do not pull it. °· Call your caregiver right away if you have any trouble while changing the catheter. °· When the old catheter is removed, the  new one should be put in right away. This is because the opening will close quickly. If you have a problem, go to an emergency clinic right away. °RISKS AND COMPLICATIONS °· Urine flow can become blocked. This can happen if the catheter or tubes are not working right. A blood clot can also block urine flow. °· The catheter might irritate tissue in your body. This can cause bleeding. °· The skin near the opening for the catheter may become irritated or infected. °· Bacteria may get into your bladder. This can cause a urinary tract infection. °HOME CARE INSTRUCTIONS °· Take all medicines prescribed by your caregiver. Follow the directions carefully. °· Drink 8 glasses of water every day. This produces good urine flow. °· Check the skin around your catheter a few times every day. Watch for redness and swelling. Look for any fluids coming out of the opening. °· Do not use powder or cream around the catheter opening. °· Do not take tub baths or use pools or hot tubs. °· Keep all follow-up appointments. °SEEK MEDICAL CARE IF: °· You leak urine. °· Your skin around the catheter becomes red or sore. °· Your urine flow slows down. °· Your urine gets cloudy or smelly. °SEEK IMMEDIATE MEDICAL CARE IF:  °· You have chills, nausea, or back pain. °· You have trouble changing your catheter. °· Your catheter comes out. °· You have blood in your urine. °· You have no urine flow for 1 hour. °· You have a fever. °  °This information is not intended to replace advice given to you by your health care provider. Make sure you discuss any questions you have with your health care provider. °  °Document Released: 12/30/2010 Document Revised: 08/28/2014 Document Reviewed: 12/30/2010 °Elsevier Interactive Patient Education ©2016 [employer] ° °

## 2015-06-21 NOTE — Procedures (Signed)
Interventional Radiology Procedure Note  Procedure: Exchange of 54F pigtail catheter for a modified-tip 54F foley cathter.  Complications: None Recommendations:  - Ok to shower   - Do not submerge  - Routine care   Signed,  Dulcy Fanny. Earleen Newport, DO

## 2015-09-10 ENCOUNTER — Inpatient Hospital Stay
Admission: EM | Admit: 2015-09-10 | Discharge: 2015-09-13 | DRG: 872 | Disposition: A | Discharge status: Home / Self Care | Payer: Self-pay | Attending: Internal Medicine | Admitting: Internal Medicine

## 2015-09-10 ENCOUNTER — Emergency Department: Payer: Self-pay

## 2015-09-10 DIAGNOSIS — R079 Chest pain, unspecified: Secondary | ICD-10-CM

## 2015-09-10 DIAGNOSIS — G8929 Other chronic pain: Secondary | ICD-10-CM | POA: Diagnosis present

## 2015-09-10 DIAGNOSIS — Z79899 Other long term (current) drug therapy: Secondary | ICD-10-CM

## 2015-09-10 DIAGNOSIS — M79606 Pain in leg, unspecified: Secondary | ICD-10-CM | POA: Diagnosis present

## 2015-09-10 DIAGNOSIS — G629 Polyneuropathy, unspecified: Secondary | ICD-10-CM | POA: Diagnosis present

## 2015-09-10 DIAGNOSIS — A419 Sepsis, unspecified organism: Secondary | ICD-10-CM | POA: Diagnosis present

## 2015-09-10 DIAGNOSIS — R1084 Generalized abdominal pain: Secondary | ICD-10-CM

## 2015-09-10 DIAGNOSIS — N390 Urinary tract infection, site not specified: Secondary | ICD-10-CM | POA: Diagnosis present

## 2015-09-10 DIAGNOSIS — A4151 Sepsis due to Escherichia coli [E. coli]: Principal | ICD-10-CM | POA: Diagnosis present

## 2015-09-10 LAB — URINALYSIS COMPLETE WITH MICROSCOPIC (ARMC ONLY)
Bilirubin Urine: NEGATIVE
Glucose, UA: NEGATIVE mg/dL
KETONES UR: NEGATIVE mg/dL
NITRITE: NEGATIVE
PH: 5 (ref 5.0–8.0)
PROTEIN: 100 mg/dL — AB
Specific Gravity, Urine: 1.01 (ref 1.005–1.030)

## 2015-09-10 LAB — HEPATIC FUNCTION PANEL
ALT: 26 U/L (ref 17–63)
AST: 38 U/L (ref 15–41)
Albumin: 4.5 g/dL (ref 3.5–5.0)
Alkaline Phosphatase: 104 U/L (ref 38–126)
BILIRUBIN DIRECT: 0.1 mg/dL (ref 0.1–0.5)
Indirect Bilirubin: 0.8 mg/dL (ref 0.3–0.9)
Total Bilirubin: 0.9 mg/dL (ref 0.3–1.2)
Total Protein: 8.1 g/dL (ref 6.5–8.1)

## 2015-09-10 LAB — BASIC METABOLIC PANEL
Anion gap: 14 (ref 5–15)
BUN: 9 mg/dL (ref 6–20)
CHLORIDE: 107 mmol/L (ref 101–111)
CO2: 21 mmol/L — AB (ref 22–32)
Calcium: 9.7 mg/dL (ref 8.9–10.3)
Creatinine, Ser: 1.12 mg/dL (ref 0.61–1.24)
GFR calc Af Amer: >60 mL/min (ref >60)
GFR calc non Af Amer: >60 mL/min (ref >60)
GLUCOSE: 83 mg/dL (ref 65–99)
POTASSIUM: 3.6 mmol/L (ref 3.5–5.1)
Sodium: 142 mmol/L (ref 135–145)

## 2015-09-10 LAB — CBC
HEMATOCRIT: 50.5 % (ref 40.0–52.0)
Hemoglobin: 17.3 g/dL (ref 13.0–18.0)
MCH: 34.4 pg — ABNORMAL HIGH (ref 26.0–34.0)
MCHC: 34.3 g/dL (ref 32.0–36.0)
MCV: 100.1 fL — AB (ref 80.0–100.0)
Platelets: 177 10*3/uL (ref 150–440)
RBC: 5.05 MIL/uL (ref 4.40–5.90)
RDW: 13.6 % (ref 11.5–14.5)
WBC: 11 10*3/uL — ABNORMAL HIGH (ref 3.8–10.6)

## 2015-09-10 LAB — LACTIC ACID, PLASMA
LACTIC ACID, VENOUS: 2.9 mmol/L — AB (ref 0.5–2.0)
LACTIC ACID, VENOUS: 0.9 mmol/L (ref 0.5–2.0)

## 2015-09-10 LAB — TROPONIN I
Troponin I: <0.03 ng/mL (ref <0.031)
Troponin I: 0.03 ng/mL (ref <0.031)

## 2015-09-10 LAB — LIPASE, BLOOD: LIPASE: 24 U/L (ref 11–51)

## 2015-09-10 MED ORDER — SODIUM CHLORIDE 0.9 % IV BOLUS (SEPSIS)
1000.0000 mL | Freq: Once | INTRAVENOUS | Status: AC
  Administered 2015-09-10: 1000 mL via INTRAVENOUS

## 2015-09-10 MED ORDER — VANCOMYCIN HCL 10 G IV SOLR
1250.0000 mg | Freq: Two times a day (BID) | INTRAVENOUS | Status: DC
  Administered 2015-09-11 – 2015-09-12 (×4): 1250 mg via INTRAVENOUS
  Filled 2015-09-10 (×5): qty 1250

## 2015-09-10 MED ORDER — ACETAMINOPHEN 325 MG PO TABS: 650.0000 mg | ORAL_TABLET | Freq: Four times a day (QID) | ORAL | Status: DC | PRN

## 2015-09-10 MED ORDER — MORPHINE SULFATE (PF) 4 MG/ML IV SOLN
4.0000 mg | Freq: Once | INTRAVENOUS | Status: AC
  Administered 2015-09-10: 4 mg via INTRAVENOUS

## 2015-09-10 MED ORDER — OXYCODONE HCL 5 MG PO TABS
ORAL_TABLET | ORAL | Status: AC
  Administered 2015-09-10: 5 mg via ORAL
  Filled 2015-09-10: qty 1

## 2015-09-10 MED ORDER — DIATRIZOATE MEGLUMINE & SODIUM 66-10 % PO SOLN
15.0000 mL | Freq: Once | ORAL | Status: AC
  Administered 2015-09-10: 15 mL via ORAL

## 2015-09-10 MED ORDER — MORPHINE SULFATE (PF) 4 MG/ML IV SOLN
INTRAVENOUS | Status: AC
  Administered 2015-09-10: 4 mg via INTRAVENOUS
  Filled 2015-09-10: qty 1

## 2015-09-10 MED ORDER — MORPHINE SULFATE (PF) 2 MG/ML IV SOLN
2.0000 mg | INTRAVENOUS | Status: DC | PRN
  Administered 2015-09-10 – 2015-09-12 (×4): 2 mg via INTRAVENOUS
  Filled 2015-09-10 (×5): qty 1

## 2015-09-10 MED ORDER — IOPAMIDOL (ISOVUE-370) INJECTION 76%
100.0000 mL | Freq: Once | INTRAVENOUS | Status: AC | PRN
  Administered 2015-09-10: 100 mL via INTRAVENOUS

## 2015-09-10 MED ORDER — DIPHENHYDRAMINE HCL 25 MG PO CAPS: 25.0000 mg | ORAL_CAPSULE | Freq: Every evening | ORAL | Status: DC | PRN

## 2015-09-10 MED ORDER — RISAQUAD PO CAPS
1.0000 | ORAL_CAPSULE | Freq: Every day | ORAL | Status: DC
  Administered 2015-09-11 – 2015-09-13 (×3): 1 via ORAL
  Filled 2015-09-10 (×3): qty 1

## 2015-09-10 MED ORDER — ONDANSETRON HCL 4 MG PO TABS: 4.0000 mg | ORAL_TABLET | Freq: Four times a day (QID) | ORAL | Status: DC | PRN

## 2015-09-10 MED ORDER — ENOXAPARIN SODIUM 40 MG/0.4ML ~~LOC~~ SOLN
40.0000 mg | SUBCUTANEOUS | Status: DC
  Administered 2015-09-11 – 2015-09-12 (×2): 40 mg via SUBCUTANEOUS
  Filled 2015-09-10 (×2): qty 0.4

## 2015-09-10 MED ORDER — ONDANSETRON HCL 4 MG/2ML IJ SOLN: 4.0000 mg | Freq: Four times a day (QID) | INTRAMUSCULAR | Status: DC | PRN

## 2015-09-10 MED ORDER — MORPHINE SULFATE (PF) 4 MG/ML IV SOLN
4.0000 mg | Freq: Once | INTRAVENOUS | Status: AC
  Administered 2015-09-10: 4 mg via INTRAVENOUS
  Filled 2015-09-10: qty 1

## 2015-09-10 MED ORDER — PIPERACILLIN-TAZOBACTAM 3.375 G IVPB
3.3750 g | Freq: Three times a day (TID) | INTRAVENOUS | Status: DC
  Administered 2015-09-10 – 2015-09-13 (×8): 3.375 g via INTRAVENOUS
  Filled 2015-09-10 (×10): qty 50

## 2015-09-10 MED ORDER — VANCOMYCIN HCL 500 MG IV SOLR
500.0000 mg | Freq: Once | INTRAVENOUS | Status: AC
  Administered 2015-09-10: 500 mg via INTRAVENOUS
  Filled 2015-09-10: qty 500

## 2015-09-10 MED ORDER — PIPERACILLIN-TAZOBACTAM 3.375 G IVPB 30 MIN
3.3750 g | Freq: Once | INTRAVENOUS | Status: AC
  Administered 2015-09-10: 3.375 g via INTRAVENOUS
  Filled 2015-09-10: qty 50

## 2015-09-10 MED ORDER — ACETAMINOPHEN 650 MG RE SUPP: 650.0000 mg | Freq: Four times a day (QID) | RECTAL | Status: DC | PRN

## 2015-09-10 MED ORDER — VANCOMYCIN HCL IN DEXTROSE 1-5 GM/200ML-% IV SOLN
1000.0000 mg | Freq: Once | INTRAVENOUS | Status: AC
Start: 2015-09-10 — End: 2015-09-10
  Administered 2015-09-10: 1000 mg via INTRAVENOUS
  Filled 2015-09-10: qty 200

## 2015-09-10 MED ORDER — SODIUM CHLORIDE 0.9 % IV BOLUS (SEPSIS)
334.0000 mL | Freq: Once | INTRAVENOUS | Status: AC
  Administered 2015-09-10: 334 mL via INTRAVENOUS

## 2015-09-10 MED ORDER — OXYCODONE HCL 5 MG PO TABS
5.0000 mg | ORAL_TABLET | ORAL | Status: DC | PRN
  Administered 2015-09-10 – 2015-09-13 (×9): 5 mg via ORAL
  Filled 2015-09-10 (×8): qty 1

## 2015-09-10 MED ORDER — DIPHENHYDRAMINE-APAP (SLEEP) 25-500 MG PO TABS: 1.0000 | ORAL_TABLET | Freq: Every evening | ORAL | Status: DC | PRN

## 2015-09-10 MED ORDER — SODIUM CHLORIDE 0.9 % IV SOLN
INTRAVENOUS | Status: DC
Start: 2015-09-10 — End: 2015-09-12
  Administered 2015-09-10 – 2015-09-11 (×3): via INTRAVENOUS

## 2015-09-10 MED ORDER — ONDANSETRON HCL 4 MG/2ML IJ SOLN
4.0000 mg | Freq: Once | INTRAMUSCULAR | Status: AC
  Administered 2015-09-10: 4 mg via INTRAVENOUS
  Filled 2015-09-10: qty 2

## 2015-09-10 MED ORDER — SODIUM CHLORIDE 0.9% FLUSH
3.0000 mL | Freq: Two times a day (BID) | INTRAVENOUS | Status: DC
  Administered 2015-09-10 – 2015-09-13 (×5): 3 mL via INTRAVENOUS

## 2015-09-10 NOTE — ED Provider Notes (Signed)
[facility] Emergency Department Provider Note   ____________________________________________  Time seen: Approximately 3:30 PM  I have reviewed the triage vital signs and the nursing notes.   HISTORY  Chief Complaint Chest Pain and Hematuria    HPI Gerald Odonnell is a 60 y.o. male with History of colon cancer status post resection, hypertension, urinary retention with indwelling suprapubic Foley catheter who presents for evaluation of gradual onset chest pain today, constant since onset, worse with exertion, radiating to the right upper quadrant/back. He is also complaining of several weeks of lower abdominal pain and reports that he feels the pain "deep inside my bladder" since his suprapubic Foley catheter was recently changed. No fevers or chills. No cough. No vomiting.   Past Medical History  Diagnosis Date  . Urinary retention   . Shortness of breath dyspnea   . Cancer ([place])     colon  . S/P colon resection   . Hypertension     Patient Active Problem List   Diagnosis Date Noted  . Clostridium difficile diarrhea   . Colitis, infectious 04/23/2015  . Incomplete bladder emptying 09/10/2014  . Edema leg 09/15/2013  . Pelvic and perineal pain 09/15/2013  . Benign prostatic hyperplasia with urinary obstruction 07/22/2013    Past Surgical History  Procedure Laterality Date  . Abdominal surgery    . Suprapubic catheter insertion      Current Outpatient Rx  Name  Route  Sig  Dispense  Refill  . diphenhydramine-acetaminophen (TYLENOL PM) 25-500 MG TABS tablet   Oral   Take 1 tablet by mouth at bedtime as needed (for sleep/pain).         Marland Kitchen acidophilus (RISAQUAD) CAPS capsule   Oral   Take 2 capsules by mouth daily. Patient not taking: Reported on 06/21/2015   30 capsule   0   . levofloxacin (LEVAQUIN) 750 MG tablet   Oral   Take 1 tablet (750 mg total) by mouth daily. Patient not taking: Reported on 06/21/2015   10 tablet   0     . metroNIDAZOLE (FLAGYL) 500 MG tablet   Oral   Take 1 tablet (500 mg total) by mouth 3 (three) times daily. Patient not taking: Reported on 06/21/2015   30 tablet   0   . oxyCODONE-acetaminophen (PERCOCET/ROXICET) 5-325 MG tablet   Oral   Take 2 tablets by mouth every 6 (six) hours as needed for moderate pain. Patient not taking: Reported on 06/21/2015   30 tablet   0   . tamsulosin (FLOMAX) 0.4 MG CAPS capsule   Oral   Take 1 capsule (0.4 mg total) by mouth daily. Patient not taking: Reported on 09/10/2015   30 capsule   0     Allergies Review of patient's allergies indicates no known allergies.  No family history on file.  Social History Social History  Substance Use Topics  . Smoking status: Current Every Day Smoker -- 0.50 packs/day    Types: Cigarettes  . Smokeless tobacco: None  . Alcohol Use: No    Review of Systems Constitutional: No fever/chills Eyes: No visual changes. ENT: No sore throat. Cardiovascular: + chest pain. Respiratory: +shortness of breath. Gastrointestinal: No abdominal pain.  No nausea, no vomiting.  No diarrhea.  No constipation. Genitourinary: Negative for dysuria. Musculoskeletal: Negative for back pain. Skin: Negative for rash. Neurological: Negative for headaches, focal weakness or numbness.  10-point ROS otherwise negative.  ____________________________________________   PHYSICAL EXAM:  VITAL SIGNS: ED Triage  Vitals  Enc Vitals Group     BP 09/10/15 1422 123/70 mmHg     Pulse Rate 09/10/15 1422 137     Resp 09/10/15 1422 22     Temp 09/10/15 1422 97.8 F (36.6 C)     Temp Source 09/10/15 1422 Oral     SpO2 09/10/15 1422 96 %     Weight 09/10/15 1419 245 lb (111.131 kg)     Height 09/10/15 1419 5\' 10"  (1.778 m)     Head Cir --      Peak Flow --      Pain Score 09/10/15 1420 10     Pain Loc --      Pain Edu? --      Excl. in [place]? --     Constitutional: Alert and oriented. Nontoxic-appearing and in no acute  distress. Eyes: Conjunctivae are normal. PERRL. EOMI. Head: Atraumatic. Nose: No congestion/rhinnorhea. Mouth/Throat: Mucous membranes are moist.  Oropharynx non-erythematous. Neck: No stridor. Supple without meningismus. Cardiovascular: tachycardia rate, regular rhythm. Grossly normal heart sounds.  Good peripheral circulation. Respiratory: Normal respiratory effort, mildly tachypneic.  No retractions. Lungs CTAB. Gastrointestinal: Soft with moderate diffuse tenderness. Suprapubic Foley catheter in the lower abdomen without surrounding erythema, no purulent drainage, no fluctuance. No CVA tenderness. Genitourinary: deferred Musculoskeletal: No lower extremity tenderness nor edema.  No joint effusions. Neurologic:  Normal speech and language. No gross focal neurologic deficits are appreciated. No gait instability. Skin:  Skin is warm, dry and intact. No rash noted. Psychiatric: Mood and affect are normal. Speech and behavior are normal.  ____________________________________________   LABS (all labs ordered are listed, but only abnormal results are displayed)  Labs Reviewed  BASIC METABOLIC PANEL - Abnormal; Notable for the following:    CO2 21 (*)    All other components within normal limits  CBC - Abnormal; Notable for the following:    WBC 11.0 (*)    MCV 100.1 (*)    MCH 34.4 (*)    All other components within normal limits  URINALYSIS COMPLETEWITH MICROSCOPIC ([facility] ONLY) - Abnormal; Notable for the following:    Color, Urine AMBER (*)    APPearance CLOUDY (*)    Hgb urine dipstick 3+ (*)    Protein, ur 100 (*)    Leukocytes, UA 2+ (*)    Bacteria, UA MANY (*)    Squamous Epithelial / LPF 0-5 (*)    All other components within normal limits  LACTIC ACID, PLASMA - Abnormal; Notable for the following:    Lactic Acid, Venous 2.9 (*)    All other components within normal limits  CULTURE, BLOOD (ROUTINE X 2)  CULTURE, BLOOD (ROUTINE X 2)  URINE CULTURE  TROPONIN I  HEPATIC  FUNCTION PANEL  LIPASE, BLOOD  LACTIC ACID, PLASMA   ____________________________________________  EKG  ED ECG REPORT I, Joanne Gavel, the attending physician, personally viewed and interpreted this ECG.   Date: 09/10/2015  EKG Time: 14:21  Rate: 139  Rhythm: sinus tachycardia  Axis: normal  Intervals:none  ST&T Change: No acute ST elevation. Minimal ST depression in V2, V3. PVCs.  ____________________________________________  RADIOLOGY  CXR IMPRESSION: No active cardiopulmonary disease.  CTA chest, CT abdomen and pelvis with contrast IMPRESSION: No evidence of pulmonary emboli.  Fatty liver.  Stable right adrenal adenoma.  Scattered sclerotic foci within the bony structures of the chest and abdomen. Although these may represent bone islands they have not present on prior exam from 2010. The possibility of prostatic  metastatic disease deserves consideration. Further evaluation is recommended.   ____________________________________________   PROCEDURES  Procedure(s) performed: None  Critical Care performed: Yes, see critical care note(s). Total critical care time spent 35 minutes.  ____________________________________________   INITIAL IMPRESSION / ASSESSMENT AND PLAN / ED COURSE  Pertinent labs & imaging results that were available during my care of the patient were reviewed by me and considered in my medical decision making (see chart for details).  Gerald Odonnell is a 60 y.o. male with History of colon cancer status post resection, hypertension, urinary retention with indwelling suprapubic Foley catheter who presents for evaluation of gradual onset chest pain today, worse with exertion. On exam, he is tachycardic and tachypneic, meeting 2 out of 4 Sirs criteria. Code sepsis initiated, we'll give IV fluids, vancomycin, Zosyn empirically. Plan for screening labs, chest x-ray, urinalysis, likely CT of the chest abdomen pelvis. Anticipate  admission.  ----------------------------------------- 5:54 PM on 09/10/2015 ----------------------------------------- Labs reviewed, CBC is notable for mild leukocytosis with white blood cell count of 11, negative troponin, unremarkable BMP. Lactic acid elevated at 2.9, the patient received full 30 mL/kg bolus of normal saline in the emergency department. Urinalysis with 2+ leukocytes, many red blood cells, white blood cells, many bacteria. Suspect he may have sepsis secondary to urinary tract infection, urine cultures pending. CT scan showed no acute life-threatening cardiopulmonary or intra-abdominal pelvic pathology. Case discussed with hospitalist, Dr. Lavetta Nielsen, for admission at this time. On reassessment, heart rate has improved to 100 bpm, blood pressure 133/93.  ____________________________________________   FINAL CLINICAL IMPRESSION(S) / ED DIAGNOSES  Final diagnoses:  Chest pain, unspecified chest pain type  Generalized abdominal pain  Sepsis secondary to UTI ([place])      NEW MEDICATIONS STARTED DURING THIS VISIT:  New Prescriptions   No medications on file     Note:  This document was prepared using Dragon voice recognition software and may include unintentional dictation errors.    Joanne Gavel, MD 09/10/15 [phone]

## 2015-09-10 NOTE — ED Notes (Signed)
Pt resting in bed in no acute distress, watching tv

## 2015-09-10 NOTE — ED Notes (Signed)
Pt states he walked from [employer] today for suprapubic catheter pain/blood in urine, states catheter placed 2 weeks ago.. States on the way here he began having chest pain.. Pt is diaphoretic on arrival, respirations WNL.Marland Kitchen

## 2015-09-10 NOTE — ED Notes (Signed)
Patient transported to CT 

## 2015-09-10 NOTE — ED Notes (Signed)
Pt returned from CT, resting in bed in no acute distress

## 2015-09-10 NOTE — ED Notes (Signed)
Dr. Edd Fabian notified of critical lactic of 2.9

## 2015-09-10 NOTE — ED Notes (Signed)
States chest pain for several days but increasing today when he walked from [employer], pt arrives awake and alert, states left chest pain that radiates to his right chest, superpubic cath in place, EDP at bedside

## 2015-09-10 NOTE — ED Notes (Signed)
CT notified pt finished with contrast. 

## 2015-09-10 NOTE — H&P (Signed)
[facility] NAME: Gerald Odonnell    MR#:  [id number]  DATE OF BIRTH:  10/18/1954   DATE OF ADMISSION:  09/10/2015  PRIMARY CARE PHYSICIAN: Christie Nottingham., PA   REQUESTING/REFERRING PHYSICIAN: Edd Fabian  CHIEF COMPLAINT:   Chief Complaint  Patient presents with  . Chest Pain  . Hematuria    HISTORY OF PRESENT ILLNESS:  Gerald Odonnell  is a 60 y.o. male with a known history of Suprapubic catheter, essential hypertension who is presenting with chest pain. Patient states that he is had acute onset chest pain retrosternal in location and radiation to the right chest pressure/sharp in quality 8/10 intensity no worsening or relieving factors. Patient also complained of abdominal pain which is been present for about 2 weeks ever since changing his suprapubic catheter.  PAST MEDICAL HISTORY:   Past Medical History  Diagnosis Date  . Urinary retention   . Shortness of breath dyspnea   . Cancer ([place])     colon  . S/P colon resection   . Hypertension     PAST SURGICAL HISTORY:   Past Surgical History  Procedure Laterality Date  . Abdominal surgery    . Suprapubic catheter insertion      SOCIAL HISTORY:   Social History  Substance Use Topics  . Smoking status: Current Every Day Smoker -- 0.50 packs/day    Types: Cigarettes  . Smokeless tobacco: Not on file  . Alcohol Use: No    FAMILY HISTORY:   Family History  Problem Relation Age of Onset  . Hypertension Other     DRUG ALLERGIES:  No Known Allergies  REVIEW OF SYSTEMS:  REVIEW OF SYSTEMS:  CONSTITUTIONAL: Denies fevers, chills, fatigue, weakness.  EYES: Denies blurred vision, double vision, or eye pain.  EARS, NOSE, THROAT: Denies tinnitus, ear pain, hearing loss.  RESPIRATORY: denies cough, shortness of breath, wheezing  CARDIOVASCULAR: Positive chest pain, denies palpitations, edema.  GASTROINTESTINAL: Denies nausea, vomiting, diarrhea, positive abdominal  pain.  GENITOURINARY: Denies dysuria, hematuria.  ENDOCRINE: Denies nocturia or thyroid problems. HEMATOLOGIC AND LYMPHATIC: Denies easy bruising or bleeding.  SKIN: Denies rash or lesions.  MUSCULOSKELETAL: Denies pain in neck, back, shoulder, knees, hips, or further arthritic symptoms.  NEUROLOGIC: Denies paralysis, paresthesias.  PSYCHIATRIC: Denies anxiety or depressive symptoms. Otherwise full review of systems performed by me is negative.   MEDICATIONS AT HOME:   Prior to Admission medications   Medication Sig Start Date End Date Taking? Authorizing Provider  diphenhydramine-acetaminophen (TYLENOL PM) 25-500 MG TABS tablet Take 1 tablet by mouth at bedtime as needed (for sleep/pain).   Yes Historical Provider, MD  acidophilus (RISAQUAD) CAPS capsule Take 2 capsules by mouth daily. Patient not taking: Reported on 06/21/2015 04/29/15   Epifanio Lesches, MD  levofloxacin (LEVAQUIN) 750 MG tablet Take 1 tablet (750 mg total) by mouth daily. Patient not taking: Reported on 06/21/2015 04/29/15   Epifanio Lesches, MD  metroNIDAZOLE (FLAGYL) 500 MG tablet Take 1 tablet (500 mg total) by mouth 3 (three) times daily. Patient not taking: Reported on 06/21/2015 04/29/15   Epifanio Lesches, MD  oxyCODONE-acetaminophen (PERCOCET/ROXICET) 5-325 MG tablet Take 2 tablets by mouth every 6 (six) hours as needed for moderate pain. Patient not taking: Reported on 06/21/2015 04/29/15   Epifanio Lesches, MD  tamsulosin (FLOMAX) 0.4 MG CAPS capsule Take 1 capsule (0.4 mg total) by mouth daily. Patient not taking: Reported on 09/10/2015 04/29/15   Epifanio Lesches, MD  VITAL SIGNS:  Blood pressure 148/97, pulse 95, temperature 97.8 F (36.6 C), temperature source Oral, resp. rate 16, height 5\' 10"  (1.778 m), weight 245 lb (111.131 kg), SpO2 96 %.  PHYSICAL EXAMINATION:  VITAL SIGNS: Filed Vitals:   09/10/15 1800 09/10/15 1830  BP: 139/85 148/97  Pulse: 99 95  Temp:    Resp: 24 16    GENERAL:60 y.o.male currently in no acute distress.  HEAD: Normocephalic, atraumatic.  EYES: Pupils equal, round, reactive to light. Extraocular muscles intact. No scleral icterus.  MOUTH: Moist mucosal membrane. Dentition intact. No abscess noted.  EAR, NOSE, THROAT: Clear without exudates. No external lesions.  NECK: Supple. No thyromegaly. No nodules. No JVD.  PULMONARY: Clear to ascultation, without wheeze rails or rhonci. No use of accessory muscles, Good respiratory effort. good air entry bilaterally CHEST: Nontender to palpation.  CARDIOVASCULAR: S1 and S2. Regular rate and rhythm. No murmurs, rubs, or gallops. Trace edema. Pedal pulses 2+ bilaterally.  GASTROINTESTINAL: Soft, nontender, nondistended. No masses. Positive bowel sounds. No hepatosplenomegaly. Suprapubic catheter in place  MUSCULOSKELETAL: No swelling, clubbing, or edema. Range of motion full in all extremities.  NEUROLOGIC: Cranial nerves II through XII are intact. No gross focal neurological deficits. Sensation intact. Reflexes intact.  SKIN: No ulceration, lesions, rashes, or cyanosis. Skin warm and dry. Turgor intact.  PSYCHIATRIC: Mood, affect within normal limits. The patient is awake, alert and oriented x 3. Insight, judgment intact.    LABORATORY PANEL:   CBC  Recent Labs Lab 09/10/15 1423  WBC 11.0*  HGB 17.3  HCT 50.5  PLT 177   ------------------------------------------------------------------------------------------------------------------  Chemistries   Recent Labs Lab 09/10/15 1423  NA 142  K 3.6  CL 107  CO2 21*  GLUCOSE 83  BUN 9  CREATININE 1.12  CALCIUM 9.7  AST 38  ALT 26  ALKPHOS 104  BILITOT 0.9   ------------------------------------------------------------------------------------------------------------------  Cardiac Enzymes  Recent Labs Lab 09/10/15 1423  TROPONINI <0.03    ------------------------------------------------------------------------------------------------------------------  RADIOLOGY:  Dg Chest 2 View  09/10/2015  CLINICAL DATA:  Acute chest pain. EXAM: CHEST  2 VIEW COMPARISON:  November 04, 2011. FINDINGS: The heart size and mediastinal contours are within normal limits. Both lungs are clear. The visualized skeletal structures are unremarkable. No pneumothorax or pleural effusion is noted. Stable elevation of left hemidiaphragm is noted. IMPRESSION: No active cardiopulmonary disease. Electronically Signed   By: [name], M.D.   On: 09/10/2015 14:55   Ct Abdomen Pelvis W Contrast  09/10/2015  CLINICAL DATA:  Hematuria chest pain EXAM: CT ANGIOGRAPHY CHEST CT ABDOMEN AND PELVIS WITH CONTRAST TECHNIQUE: Multidetector CT imaging of the chest was performed using the standard protocol during bolus administration of intravenous contrast. Multiplanar CT image reconstructions and MIPs were obtained to evaluate the vascular anatomy. Multidetector CT imaging of the abdomen and pelvis was performed using the standard protocol during bolus administration of intravenous contrast. CONTRAST:  100 mL Isovue 370. COMPARISON:  None. FINDINGS: CTA CHEST FINDINGS Lungs are well aerated bilaterally and demonstrate very mild emphysematous changes. No focal infiltrate, effusion or parenchymal nodule is seen. The thoracic inlet is within normal limits. The thoracic aorta in its branches are unremarkable. The pulmonary artery shows a normal branching pattern bilaterally. No filling defects are identified to suggest pulmonary embolism. No hilar or mediastinal adenopathy is seen. Mild coronary calcifications are noted. The osseous structures show scattered small sclerotic lesions throughout the rib cage and spine. Clinical correlation as to any possible prostate carcinoma is recommended.  CT ABDOMEN and PELVIS FINDINGS The liver is diffusely decreased in attenuation consistent with  fatty infiltration. The gallbladder, spleen, left adrenal gland and pancreas are within normal limits. The right adrenal gland demonstrates a stable adenoma. The kidneys are well visualized bilaterally. No renal calculi or obstructive changes are seen. Delayed images demonstrate normal excretion of contrast. Multiple small non sizable retroperitoneal lymph nodes are seen. A suprapubic Foley catheter is noted in satisfactory position. The bladder is decompressed. No pelvic mass lesion is noted. No sidewall abnormality is seen. The appendix is not well visualized. No inflammatory changes are seen. No evidence of diverticulitis is noted. The osseous structures show scattered sclerotic foci. These may simply represent bone islands although the possibility of prostate metastatic disease could not be totally excluded. Further workup is recommended. Review of the MIP images confirms the above findings. IMPRESSION: No evidence of pulmonary emboli. Fatty liver. Stable right adrenal adenoma. Scattered sclerotic foci within the bony structures of the chest and abdomen. Although these may represent bone islands they have not present on prior exam from 2010. The possibility of prostatic metastatic disease deserves consideration. Further evaluation is recommended. Electronically Signed   By: Inez Catalina M.D.   On: 09/10/2015 17:44   Ct Angio Chest Aorta W/cm &/or Wo/cm  09/10/2015  CLINICAL DATA:  Hematuria chest pain EXAM: CT ANGIOGRAPHY CHEST CT ABDOMEN AND PELVIS WITH CONTRAST TECHNIQUE: Multidetector CT imaging of the chest was performed using the standard protocol during bolus administration of intravenous contrast. Multiplanar CT image reconstructions and MIPs were obtained to evaluate the vascular anatomy. Multidetector CT imaging of the abdomen and pelvis was performed using the standard protocol during bolus administration of intravenous contrast. CONTRAST:  100 mL Isovue 370. COMPARISON:  None. FINDINGS: CTA CHEST  FINDINGS Lungs are well aerated bilaterally and demonstrate very mild emphysematous changes. No focal infiltrate, effusion or parenchymal nodule is seen. The thoracic inlet is within normal limits. The thoracic aorta in its branches are unremarkable. The pulmonary artery shows a normal branching pattern bilaterally. No filling defects are identified to suggest pulmonary embolism. No hilar or mediastinal adenopathy is seen. Mild coronary calcifications are noted. The osseous structures show scattered small sclerotic lesions throughout the rib cage and spine. Clinical correlation as to any possible prostate carcinoma is recommended. CT ABDOMEN and PELVIS FINDINGS The liver is diffusely decreased in attenuation consistent with fatty infiltration. The gallbladder, spleen, left adrenal gland and pancreas are within normal limits. The right adrenal gland demonstrates a stable adenoma. The kidneys are well visualized bilaterally. No renal calculi or obstructive changes are seen. Delayed images demonstrate normal excretion of contrast. Multiple small non sizable retroperitoneal lymph nodes are seen. A suprapubic Foley catheter is noted in satisfactory position. The bladder is decompressed. No pelvic mass lesion is noted. No sidewall abnormality is seen. The appendix is not well visualized. No inflammatory changes are seen. No evidence of diverticulitis is noted. The osseous structures show scattered sclerotic foci. These may simply represent bone islands although the possibility of prostate metastatic disease could not be totally excluded. Further workup is recommended. Review of the MIP images confirms the above findings. IMPRESSION: No evidence of pulmonary emboli. Fatty liver. Stable right adrenal adenoma. Scattered sclerotic foci within the bony structures of the chest and abdomen. Although these may represent bone islands they have not present on prior exam from 2010. The possibility of prostatic metastatic disease  deserves consideration. Further evaluation is recommended. Electronically Signed   By: Linus Mako.D.  On: 09/10/2015 17:44    EKG:   Orders placed or performed during the hospital encounter of 09/10/15  . EKG 12-Lead  . EKG 12-Lead  . ED EKG within 10 minutes  . ED EKG within 10 minutes  . ED EKG 12-Lead  . ED EKG 12-Lead    IMPRESSION AND PLAN:   60 year old Caucasian gentleman history of essential hypertension, suprapubic catheter presented with chest pain as well as abdominal pain.  1.Chest pain, central: Initiate aspirin and statin therapy, admitted to telemetry, trend cardiac enzymes 3,  if continued elevation will initiate heparin drip ,nitroglycerin when necessary, morphine when necessary,  2.Sepsis, meeting septic criteria by heart rate, respiratory rate present on arrival. Source urinary tract infection Panculture. Broad-spectrum antibiotics including vancomycin/Zosyn and taper antibiotics when culture data returns.   Continue IV fluid hydration to keep mean arterial pressure greater than 65. may require pressor therapy if blood pressure worsens. We will repeat lactic acid if the initial is greater than 2.2. Add probiotic 3. History of C. difficile, asymptomatic we'll add probiotic while on antibiotics       All the records are reviewed and case discussed with ED provider. Management plans discussed with the patient, family and they are in agreement.  CODE STATUS: Full  TOTAL TIME TAKING CARE OF THIS PATIENT: 33 minutes.    Oaklie Durrett,  Karenann Cai.D on 09/10/2015 at 6:52 PM  Between 7am to 6pm - Pager - [phone]  After 6pm: House Pager: - [phone]  [facility]  Office  [phone]  CC: Primary care physician; Christie Nottingham., PA

## 2015-09-10 NOTE — Progress Notes (Signed)
Pharmacy Antibiotic Note  Gerald Odonnell is a 60 y.o. male admitted on 09/10/2015 with sepsis.  Pharmacy has been consulted for Vancomycin and Zosyn dosing.  Plan: PK parameters:  Kel (hr-1): 0.064 Half-life (hrs): 10.83 Vd (liters): 58.80 (factor used: 0.7 L/kg)  Patient given 1 g IV x 1 in ED @ ~16:00. Based on calculation of pharamcokinetics, will give an additional 500 mg of Vancomycin to = 1500 mg loading dose. Will then start Vancomycin 1250 mg IV q12 hours.   Trough level scheduled to be drawn prior to the 01:00 dose on 5/19. Targeting a trough level of 15-20 mcg/ml.    Zosyn: Will start Zosyn 3.375 g IV q8 hours.   Height: 5\' 10"  (177.8 cm) Weight: 245 lb (111.131 kg) IBW/kg (Calculated) : 73  Temp (24hrs), Avg:97.8 F (36.6 C), Min:97.8 F (36.6 C), Max:97.8 F (36.6 C)   Recent Labs Lab 09/10/15 1423  WBC 11.0*  CREATININE 1.12    Estimated Creatinine Clearance: 87.5 mL/min (by C-G formula based on Cr of 1.12).    No Known Allergies  Antimicrobials this admission: Vancomycin  05/16 >>  Zosyn  05/16 >>   Dose adjustments this admission:   Microbiology results:  BCx:   UCx:   Sputum:   MRSA PCR:   Thank you for allowing pharmacy to be a part of this patient's care.  Latasha Buczkowski D 09/10/2015 3:52 PM

## 2015-09-11 LAB — BASIC METABOLIC PANEL
Anion gap: 3 — ABNORMAL LOW (ref 5–15)
BUN: 11 mg/dL (ref 6–20)
CALCIUM: 8.5 mg/dL — AB (ref 8.9–10.3)
CO2: 29 mmol/L (ref 22–32)
CREATININE: 1 mg/dL (ref 0.61–1.24)
Chloride: 108 mmol/L (ref 101–111)
GFR calc Af Amer: >60 mL/min (ref >60)
GFR calc non Af Amer: >60 mL/min (ref >60)
GLUCOSE: 96 mg/dL (ref 65–99)
Potassium: 3.7 mmol/L (ref 3.5–5.1)
Sodium: 140 mmol/L (ref 135–145)

## 2015-09-11 LAB — CBC
HEMATOCRIT: 39.8 % — AB (ref 40.0–52.0)
Hemoglobin: 13.8 g/dL (ref 13.0–18.0)
MCH: 35.1 pg — AB (ref 26.0–34.0)
MCHC: 34.6 g/dL (ref 32.0–36.0)
MCV: 101.5 fL — AB (ref 80.0–100.0)
Platelets: 111 10*3/uL — ABNORMAL LOW (ref 150–440)
RBC: 3.92 MIL/uL — ABNORMAL LOW (ref 4.40–5.90)
RDW: 14 % (ref 11.5–14.5)
WBC: 8.3 10*3/uL (ref 3.8–10.6)

## 2015-09-11 LAB — TROPONIN I
Troponin I: <0.03 ng/mL (ref <0.031)
Troponin I: <0.03 ng/mL (ref <0.031)

## 2015-09-11 NOTE — Progress Notes (Signed)
[facility] [place] NAME: Gerald Odonnell    MR#:  [id number]  DATE OF BIRTH:  10/18/1954  SUBJECTIVE: Admitted for chest pain . patient troponins are negative for 2 times. Chest pain is resolved at this time. Has lot of pain at suprapubic area and bladder spasms. Patient walked 3 miles to come to the hospital because of chest pain. He was able to call 911 yesterday but his phone was out of  charge.   CHIEF COMPLAINT:   Chief Complaint  Patient presents with  . Chest Pain  . Hematuria    REVIEW OF SYSTEMS:   ROS CONSTITUTIONAL: No fever, fatigue or weakness.  EYES: No blurred or double vision.  EARS, NOSE, AND THROAT: No tinnitus or ear pain.  RESPIRATORY: No cough, shortness of breath, wheezing or hemoptysis.  CARDIOVASCULAR: No chest pain, orthopnea, edema.  GASTROINTESTINAL: No nausea, vomiting, diarrhea or abdominal pain.  GENITOURINARY: No dysuria, hematuria.  ENDOCRINE: No polyuria, nocturia,  HEMATOLOGY: No anemia, easy bruising or bleeding SKIN: No rash or lesion. MUSCULOSKELETAL: No joint pain or arthritis.   NEUROLOGIC: No tingling, numbness, weakness.  PSYCHIATRY: No anxiety or depression.   DRUG ALLERGIES:  No Known Allergies  VITALS:  Blood pressure 129/81, pulse 73, temperature 98 F (36.7 C), temperature source Oral, resp. rate 20, height 5' 10.5" (1.791 m), weight 111.222 kg (245 lb 3.2 oz), SpO2 96 %.  PHYSICAL EXAMINATION:  GENERAL:  60 y.o.-year-old patient lying in the bed with no acute distress.  EYES: Pupils equal, round, reactive to light and accommodation. No scleral icterus. Extraocular muscles intact.  HEENT: Head atraumatic, normocephalic. Oropharynx and nasopharynx clear.  NECK:  Supple, no jugular venous distention. No thyroid enlargement, no tenderness.  LUNGS: Normal breath sounds bilaterally, no wheezing, rales,rhonchi or crepitation. No use of accessory muscles of respiration.   CARDIOVASCULAR: S1, S2 normal. No murmurs, rubs, or gallops.  ABDOMEN: Soft, nontender, nondistended. Bowel sounds present. No organomegaly or mass. Patient has lot of bladder spasms.  EXTREMITIES: No pedal edema, cyanosis, or clubbing.  NEUROLOGIC: Cranial nerves II through XII are intact. Muscle strength 5/5 in all extremities. Sensation intact. Gait not checked.  PSYCHIATRIC: The patient is alert and oriented x 3.  SKIN: No obvious rash, lesion, or ulcer.    LABORATORY PANEL:   CBC  Recent Labs Lab 09/11/15 0301  WBC 8.3  HGB 13.8  HCT 39.8*  PLT 111*   ------------------------------------------------------------------------------------------------------------------  Chemistries   Recent Labs Lab 09/10/15 1423 09/11/15 0301  NA 142 140  K 3.6 3.7  CL 107 108  CO2 21* 29  GLUCOSE 83 96  BUN 9 11  CREATININE 1.12 1.00  CALCIUM 9.7 8.5*  AST 38  --   ALT 26  --   ALKPHOS 104  --   BILITOT 0.9  --    ------------------------------------------------------------------------------------------------------------------  Cardiac Enzymes  Recent Labs Lab 09/11/15 0718  TROPONINI <0.03   ------------------------------------------------------------------------------------------------------------------  RADIOLOGY:  Dg Chest 2 View  09/10/2015  CLINICAL DATA:  Acute chest pain. EXAM: CHEST  2 VIEW COMPARISON:  November 04, 2011. FINDINGS: The heart size and mediastinal contours are within normal limits. Both lungs are clear. The visualized skeletal structures are unremarkable. No pneumothorax or pleural effusion is noted. Stable elevation of left hemidiaphragm is noted. IMPRESSION: No active cardiopulmonary disease. Electronically Signed   By: [name], M.D.   On: 09/10/2015 14:55   Ct Abdomen Pelvis W Contrast  09/10/2015  CLINICAL DATA:  Hematuria chest pain EXAM: CT ANGIOGRAPHY CHEST CT ABDOMEN AND PELVIS WITH CONTRAST TECHNIQUE: Multidetector CT imaging of the  chest was performed using the standard protocol during bolus administration of intravenous contrast. Multiplanar CT image reconstructions and MIPs were obtained to evaluate the vascular anatomy. Multidetector CT imaging of the abdomen and pelvis was performed using the standard protocol during bolus administration of intravenous contrast. CONTRAST:  100 mL Isovue 370. COMPARISON:  None. FINDINGS: CTA CHEST FINDINGS Lungs are well aerated bilaterally and demonstrate very mild emphysematous changes. No focal infiltrate, effusion or parenchymal nodule is seen. The thoracic inlet is within normal limits. The thoracic aorta in its branches are unremarkable. The pulmonary artery shows a normal branching pattern bilaterally. No filling defects are identified to suggest pulmonary embolism. No hilar or mediastinal adenopathy is seen. Mild coronary calcifications are noted. The osseous structures show scattered small sclerotic lesions throughout the rib cage and spine. Clinical correlation as to any possible prostate carcinoma is recommended. CT ABDOMEN and PELVIS FINDINGS The liver is diffusely decreased in attenuation consistent with fatty infiltration. The gallbladder, spleen, left adrenal gland and pancreas are within normal limits. The right adrenal gland demonstrates a stable adenoma. The kidneys are well visualized bilaterally. No renal calculi or obstructive changes are seen. Delayed images demonstrate normal excretion of contrast. Multiple small non sizable retroperitoneal lymph nodes are seen. A suprapubic Foley catheter is noted in satisfactory position. The bladder is decompressed. No pelvic mass lesion is noted. No sidewall abnormality is seen. The appendix is not well visualized. No inflammatory changes are seen. No evidence of diverticulitis is noted. The osseous structures show scattered sclerotic foci. These may simply represent bone islands although the possibility of prostate metastatic disease could not be  totally excluded. Further workup is recommended. Review of the MIP images confirms the above findings. IMPRESSION: No evidence of pulmonary emboli. Fatty liver. Stable right adrenal adenoma. Scattered sclerotic foci within the bony structures of the chest and abdomen. Although these may represent bone islands they have not present on prior exam from 2010. The possibility of prostatic metastatic disease deserves consideration. Further evaluation is recommended. Electronically Signed   By: Inez Catalina M.D.   On: 09/10/2015 17:44   Ct Angio Chest Aorta W/cm &/or Wo/cm  09/10/2015  CLINICAL DATA:  Hematuria chest pain EXAM: CT ANGIOGRAPHY CHEST CT ABDOMEN AND PELVIS WITH CONTRAST TECHNIQUE: Multidetector CT imaging of the chest was performed using the standard protocol during bolus administration of intravenous contrast. Multiplanar CT image reconstructions and MIPs were obtained to evaluate the vascular anatomy. Multidetector CT imaging of the abdomen and pelvis was performed using the standard protocol during bolus administration of intravenous contrast. CONTRAST:  100 mL Isovue 370. COMPARISON:  None. FINDINGS: CTA CHEST FINDINGS Lungs are well aerated bilaterally and demonstrate very mild emphysematous changes. No focal infiltrate, effusion or parenchymal nodule is seen. The thoracic inlet is within normal limits. The thoracic aorta in its branches are unremarkable. The pulmonary artery shows a normal branching pattern bilaterally. No filling defects are identified to suggest pulmonary embolism. No hilar or mediastinal adenopathy is seen. Mild coronary calcifications are noted. The osseous structures show scattered small sclerotic lesions throughout the rib cage and spine. Clinical correlation as to any possible prostate carcinoma is recommended. CT ABDOMEN and PELVIS FINDINGS The liver is diffusely decreased in attenuation consistent with fatty infiltration. The gallbladder, spleen, left adrenal gland and  pancreas are within normal limits. The right adrenal gland demonstrates a stable  adenoma. The kidneys are well visualized bilaterally. No renal calculi or obstructive changes are seen. Delayed images demonstrate normal excretion of contrast. Multiple small non sizable retroperitoneal lymph nodes are seen. A suprapubic Foley catheter is noted in satisfactory position. The bladder is decompressed. No pelvic mass lesion is noted. No sidewall abnormality is seen. The appendix is not well visualized. No inflammatory changes are seen. No evidence of diverticulitis is noted. The osseous structures show scattered sclerotic foci. These may simply represent bone islands although the possibility of prostate metastatic disease could not be totally excluded. Further workup is recommended. Review of the MIP images confirms the above findings. IMPRESSION: No evidence of pulmonary emboli. Fatty liver. Stable right adrenal adenoma. Scattered sclerotic foci within the bony structures of the chest and abdomen. Although these may represent bone islands they have not present on prior exam from 2010. The possibility of prostatic metastatic disease deserves consideration. Further evaluation is recommended. Electronically Signed   By: Inez Catalina M.D.   On: 09/10/2015 17:44    EKG:   Orders placed or performed during the hospital encounter of 09/10/15  . EKG 12-Lead  . EKG 12-Lead  . ED EKG within 10 minutes  . ED EKG within 10 minutes  . ED EKG 12-Lead  . ED EKG 12-Lead    ASSESSMENT AND PLAN:   #1 chest pain likely secondary to muscular skeletal origin. Troponins are negative. No indication for further cardiac workup because patient walked 3 miles yesterday to come to the emergency room that itself indicates that he has good cardiac capacity #2 sepsis present on admission likely secondary to UTI with lactic acidosis: WBC is normal now .lactic acid 2.9 admission dropped to 0.9. Follow urine culture data, continue IV  antibiotics. Patient feels better. Patient has history of chronic urinary retention, follows up with [name]., Continue IV hydration today.  #3 questionable sclerotic foci: Patient has no history of prostate cancer and no bone pains. Possibly bone islands All the records are reviewed and case discussed with Care Management/Social Workerr. Management plans discussed with the patient, family and they are in agreement.  CODE STATUS: Full TOTAL TIME TAKING CARE OF THIS PATIENT: 78minutes.   POSSIBLE D/C IN 1-2 DAYS, DEPENDING ON CLINICAL CONDITION.   Epifanio Lesches M.D on 09/11/2015 at 12:42 PM  Between 7am to 6pm - Pager - [phone]  After 6pm go to [email or web address] - password [email or web address] [facility]  Office  [phone]  CC: Primary care physician; Christie Nottingham., PA   Note: This dictation was prepared with Dragon dictation along with smaller phrase technology. Any transcriptional errors that result from this process are unintentional.

## 2015-09-12 MED ORDER — OXYBUTYNIN CHLORIDE 5 MG PO TABS
5.0000 mg | ORAL_TABLET | Freq: Three times a day (TID) | ORAL | Status: DC
  Administered 2015-09-12 – 2015-09-13 (×3): 5 mg via ORAL
  Filled 2015-09-12 (×5): qty 1

## 2015-09-12 MED ORDER — TAMSULOSIN HCL 0.4 MG PO CAPS
0.4000 mg | ORAL_CAPSULE | Freq: Every day | ORAL | Status: DC
  Administered 2015-09-12 – 2015-09-13 (×2): 0.4 mg via ORAL
  Filled 2015-09-12 (×2): qty 1

## 2015-09-12 NOTE — Progress Notes (Signed)
[facility] [place] NAME: Gerald Odonnell    MR#:  [id number]  DATE OF BIRTH:  10/18/1954  SUBJECTIVE: Admitted for chest pain . patient troponins are negative for 2 times. Chest pain is resolved at this time. Because of a lot of pain related issues requesting IV narcotics. Has bladder spasms. Complaints of chest pain leg pains. Has chronic leg pain for months .  CHIEF COMPLAINT:   Chief Complaint  Patient presents with  . Chest Pain  . Hematuria    REVIEW OF SYSTEMS:   ROS CONSTITUTIONAL: No fever, fatigue or weakness.  EYES: No blurred or double vision.  EARS, NOSE, AND THROAT: No tinnitus or ear pain.  RESPIRATORY: No cough, shortness of breath, wheezing or hemoptysis.  CARDIOVASCULAR: No chest pain, orthopnea, edema.  GASTROINTESTINAL: No nausea, vomiting, diarrhea or abdominal pain.  GENITOURINARY: No dysuria, hematuria.  ENDOCRINE: No polyuria, nocturia,  HEMATOLOGY: No anemia, easy bruising or bleeding SKIN: No rash or lesion. MUSCULOSKELETAL: No joint pain or arthritis.   NEUROLOGIC: No tingling, numbness, weakness.  PSYCHIATRY: No anxiety or depression.  Chronic Pain complaints.Marland Kitchen DRUG ALLERGIES:  No Known Allergies  VITALS:  Blood pressure 103/68, pulse 71, temperature 98 F (36.7 C), temperature source Oral, resp. rate 18, height 5' 10.5" (1.791 m), weight 111.222 kg (245 lb 3.2 oz), SpO2 95 %.  PHYSICAL EXAMINATION:  GENERAL:  60 y.o.-year-old patient lying in the bed with no acute distress.  EYES: Pupils equal, round, reactive to light and accommodation. No scleral icterus. Extraocular muscles intact.  HEENT: Head atraumatic, normocephalic. Oropharynx and nasopharynx clear.  NECK:  Supple, no jugular venous distention. No thyroid enlargement, no tenderness.  LUNGS: Normal breath sounds bilaterally, no wheezing, rales,rhonchi or crepitation. No use of accessory muscles of respiration.  CARDIOVASCULAR: S1, S2  normal. No murmurs, rubs, or gallops.  ABDOMEN: Soft, nontender, nondistended. Bowel sounds present. No organomegaly or mass. Patient has lot of bladder spasms.  Suprapubic tenderness. EXTREMITIES: No pedal edema, cyanosis, or clubbing.  NEUROLOGIC: Cranial nerves II through XII are intact. Muscle strength 5/5 in all extremities. Sensation intact. Gait not checked.  PSYCHIATRIC: The patient is alert and oriented x 3.  SKIN: No obvious rash, lesion, or ulcer.    LABORATORY PANEL:   CBC  Recent Labs Lab 09/11/15 0301  WBC 8.3  HGB 13.8  HCT 39.8*  PLT 111*   ------------------------------------------------------------------------------------------------------------------  Chemistries   Recent Labs Lab 09/10/15 1423 09/11/15 0301  NA 142 140  K 3.6 3.7  CL 107 108  CO2 21* 29  GLUCOSE 83 96  BUN 9 11  CREATININE 1.12 1.00  CALCIUM 9.7 8.5*  AST 38  --   ALT 26  --   ALKPHOS 104  --   BILITOT 0.9  --    ------------------------------------------------------------------------------------------------------------------  Cardiac Enzymes  Recent Labs Lab 09/11/15 0718  TROPONINI <0.03   ------------------------------------------------------------------------------------------------------------------  RADIOLOGY:  Dg Chest 2 View  09/10/2015  CLINICAL DATA:  Acute chest pain. EXAM: CHEST  2 VIEW COMPARISON:  November 04, 2011. FINDINGS: The heart size and mediastinal contours are within normal limits. Both lungs are clear. The visualized skeletal structures are unremarkable. No pneumothorax or pleural effusion is noted. Stable elevation of left hemidiaphragm is noted. IMPRESSION: No active cardiopulmonary disease. Electronically Signed   By: [name], M.D.   On: 09/10/2015 14:55   Ct Abdomen Pelvis W Contrast  09/10/2015  CLINICAL DATA:  Hematuria chest pain EXAM: CT  ANGIOGRAPHY CHEST CT ABDOMEN AND PELVIS WITH CONTRAST TECHNIQUE: Multidetector CT imaging of the chest  was performed using the standard protocol during bolus administration of intravenous contrast. Multiplanar CT image reconstructions and MIPs were obtained to evaluate the vascular anatomy. Multidetector CT imaging of the abdomen and pelvis was performed using the standard protocol during bolus administration of intravenous contrast. CONTRAST:  100 mL Isovue 370. COMPARISON:  None. FINDINGS: CTA CHEST FINDINGS Lungs are well aerated bilaterally and demonstrate very mild emphysematous changes. No focal infiltrate, effusion or parenchymal nodule is seen. The thoracic inlet is within normal limits. The thoracic aorta in its branches are unremarkable. The pulmonary artery shows a normal branching pattern bilaterally. No filling defects are identified to suggest pulmonary embolism. No hilar or mediastinal adenopathy is seen. Mild coronary calcifications are noted. The osseous structures show scattered small sclerotic lesions throughout the rib cage and spine. Clinical correlation as to any possible prostate carcinoma is recommended. CT ABDOMEN and PELVIS FINDINGS The liver is diffusely decreased in attenuation consistent with fatty infiltration. The gallbladder, spleen, left adrenal gland and pancreas are within normal limits. The right adrenal gland demonstrates a stable adenoma. The kidneys are well visualized bilaterally. No renal calculi or obstructive changes are seen. Delayed images demonstrate normal excretion of contrast. Multiple small non sizable retroperitoneal lymph nodes are seen. A suprapubic Foley catheter is noted in satisfactory position. The bladder is decompressed. No pelvic mass lesion is noted. No sidewall abnormality is seen. The appendix is not well visualized. No inflammatory changes are seen. No evidence of diverticulitis is noted. The osseous structures show scattered sclerotic foci. These may simply represent bone islands although the possibility of prostate metastatic disease could not be  totally excluded. Further workup is recommended. Review of the MIP images confirms the above findings. IMPRESSION: No evidence of pulmonary emboli. Fatty liver. Stable right adrenal adenoma. Scattered sclerotic foci within the bony structures of the chest and abdomen. Although these may represent bone islands they have not present on prior exam from 2010. The possibility of prostatic metastatic disease deserves consideration. Further evaluation is recommended. Electronically Signed   By: Inez Catalina M.D.   On: 09/10/2015 17:44   Ct Angio Chest Aorta W/cm &/or Wo/cm  09/10/2015  CLINICAL DATA:  Hematuria chest pain EXAM: CT ANGIOGRAPHY CHEST CT ABDOMEN AND PELVIS WITH CONTRAST TECHNIQUE: Multidetector CT imaging of the chest was performed using the standard protocol during bolus administration of intravenous contrast. Multiplanar CT image reconstructions and MIPs were obtained to evaluate the vascular anatomy. Multidetector CT imaging of the abdomen and pelvis was performed using the standard protocol during bolus administration of intravenous contrast. CONTRAST:  100 mL Isovue 370. COMPARISON:  None. FINDINGS: CTA CHEST FINDINGS Lungs are well aerated bilaterally and demonstrate very mild emphysematous changes. No focal infiltrate, effusion or parenchymal nodule is seen. The thoracic inlet is within normal limits. The thoracic aorta in its branches are unremarkable. The pulmonary artery shows a normal branching pattern bilaterally. No filling defects are identified to suggest pulmonary embolism. No hilar or mediastinal adenopathy is seen. Mild coronary calcifications are noted. The osseous structures show scattered small sclerotic lesions throughout the rib cage and spine. Clinical correlation as to any possible prostate carcinoma is recommended. CT ABDOMEN and PELVIS FINDINGS The liver is diffusely decreased in attenuation consistent with fatty infiltration. The gallbladder, spleen, left adrenal gland and  pancreas are within normal limits. The right adrenal gland demonstrates a stable adenoma. The kidneys are well visualized bilaterally. No  renal calculi or obstructive changes are seen. Delayed images demonstrate normal excretion of contrast. Multiple small non sizable retroperitoneal lymph nodes are seen. A suprapubic Foley catheter is noted in satisfactory position. The bladder is decompressed. No pelvic mass lesion is noted. No sidewall abnormality is seen. The appendix is not well visualized. No inflammatory changes are seen. No evidence of diverticulitis is noted. The osseous structures show scattered sclerotic foci. These may simply represent bone islands although the possibility of prostate metastatic disease could not be totally excluded. Further workup is recommended. Review of the MIP images confirms the above findings. IMPRESSION: No evidence of pulmonary emboli. Fatty liver. Stable right adrenal adenoma. Scattered sclerotic foci within the bony structures of the chest and abdomen. Although these may represent bone islands they have not present on prior exam from 2010. The possibility of prostatic metastatic disease deserves consideration. Further evaluation is recommended. Electronically Signed   By: Inez Catalina M.D.   On: 09/10/2015 17:44    EKG:   Orders placed or performed during the hospital encounter of 09/10/15  . EKG 12-Lead  . EKG 12-Lead  . ED EKG within 10 minutes  . ED EKG within 10 minutes  . ED EKG 12-Lead  . ED EKG 12-Lead    ASSESSMENT AND PLAN:   #1 chest pain likely secondary to muscular skeletal origin. Troponins are negative . #2. sepsis present on admission likely secondary to UTI with lactic acidosis: ecoli UTI;waiting for  Sensitivity results.because of h/o recurrent UTI before wait for S data to discharge. likley d/c am #3 questionable sclerotic foci: Patient has no history of prostate cancer and no bone pains. Possibly bone islands 4. Chronic leg pains and  possible narcotic abuse: Patient will be discharged home tomorrow and advised to stay away from IV morphine due to blood Pressure Being very soft.   follow with primary doctor for leg pains which are likely chronic.  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   . All the records are reviewed and case discussed with Care Management/Social Workerr. Management plans discussed with the patient, family and they are in agreement.  CODE STATUS: Full TOTAL TIME TAKING CARE OF THIS PATIENT: 20 minutes.   POSSIBLE D/C IN 1-2 DAYS, DEPENDING ON CLINICAL CONDITION.   Epifanio Lesches M.D on 09/12/2015 at 12:45 PM  Between 7am to 6pm - Pager - [phone]  After 6pm go to [email or web address] - password [email or web address] [facility]  Office  [phone]  CC: Primary care physician; Christie Nottingham., PA   Note: This dictation was prepared with Dragon dictation along with smaller phrase technology. Any transcriptional errors that result from this process are unintentional.

## 2015-09-12 NOTE — Progress Notes (Signed)
Pharmacy Antibiotic Note  Gerald Odonnell is a 60 y.o. male admitted on 09/10/2015 with sepsis/UTI.  Pharmacy has been consulted for Zosyn dosing.  Plan: Zosyn: Will start Zosyn 3.375 g IV q8 hours.   5/18- Ucx: +E.coli.  Vancomycin discontinued. Per MD- continue Zosyn, evaluate sensitivities when available. Patient has a suprapubic catheter.    Height: 5' 10.5" (179.1 cm) Weight: 245 lb 3.2 oz (111.222 kg) IBW/kg (Calculated) : 74.15  Temp (24hrs), Avg:98.1 F (36.7 C), Min:98 F (36.7 C), Max:98.2 F (36.8 C)   Recent Labs Lab 09/10/15 1423 09/10/15 1531 09/10/15 1834 09/11/15 0301  WBC 11.0*  --   --  8.3  CREATININE 1.12  --   --  1.00  LATICACIDVEN  --  2.9* 0.9  --     Estimated Creatinine Clearance: 98.9 mL/min (by C-G formula based on Cr of 1).    No Known Allergies  Antimicrobials this admission: Vancomycin  05/16 >> 05/18 Zosyn  05/16 >>   Dose adjustments this admission:   Microbiology results:  5/16 BCx:   5/16 UCx: E.Coli  Sputum:   MRSA PCR:   Thank you for allowing pharmacy to be a part of this patient's care.  Jazae Gandolfi A 09/12/2015 2:15 PM

## 2015-09-12 NOTE — Progress Notes (Signed)
Patient continuing to report 8/10 pain from left chest to right abdomen. Reports his current pain regimen is not relieving it. Dr. Vianne Bulls at bedside and made aware. Patients BP a little soft and MD said to wait until BP increases a little bit before giving next pain medication. No new orders, MD asked that BP be reassessed in an hour.

## 2015-09-12 NOTE — Plan of Care (Signed)
Problem: Pain Managment: Goal: General experience of comfort will improve Outcome: Progressing PRN PO oxyIR and IV morphine have not been relieving patients pain. MD ordered oxybutinin which patient reports has brought his pain from an 8 to a 3. Patient reports great satisfaction with this pain management and medication.   Problem: Physical Regulation: Goal: Will remain free from infection Outcome: Progressing IV abx

## 2015-09-13 LAB — URINE CULTURE

## 2015-09-13 LAB — CREATININE, SERUM
Creatinine, Ser: 0.91 mg/dL (ref 0.61–1.24)
GFR calc Af Amer: >60 mL/min (ref >60)
GFR calc non Af Amer: >60 mL/min (ref >60)

## 2015-09-13 MED ORDER — OXYBUTYNIN CHLORIDE 5 MG PO TABS: 5.0000 mg | ORAL_TABLET | Freq: Three times a day (TID) | ORAL | Status: DC

## 2015-09-13 MED ORDER — NITROFURANTOIN MACROCRYSTAL 100 MG PO CAPS: 100.0000 mg | ORAL_CAPSULE | Freq: Two times a day (BID) | ORAL | Status: DC

## 2015-09-13 NOTE — Progress Notes (Signed)
A&O. Independent. Complaints of pain in lower abdomen, states this is relieved with ditropan. Suprapubic catheter in place. IV antibiotics given.

## 2015-09-13 NOTE — Discharge Summary (Signed)
Gerald Odonnell, is a 60 y.o. male  DOB 10/18/1954  MRN [id number].  Admission date:  09/10/2015  Admitting Physician  Gerald [place], MD  Discharge Date:  09/13/2015   Primary MD  Gerald Odonnell., PA  Recommendations for primary care physician for things to follow:   Follow-up with primary doctor, urologist [name]  in 1 week   Admission Diagnosis  Generalized abdominal pain [R10.84] Sepsis secondary to UTI ([name]) [A41.9, N39.0] Chest pain, unspecified chest pain type [R07.9]   Discharge Diagnosis  Generalized abdominal pain [R10.84] Sepsis secondary to UTI ([name]) [A41.9, N39.0] Chest pain, unspecified chest pain type [R07.9]    Active Problems:   Sepsis ([place])   Chest pain      Past Medical History  Diagnosis Date  . Urinary retention   . Shortness of breath dyspnea   . Cancer ([name])     colon  . S/P colon resection   . Hypertension     Past Surgical History  Procedure Laterality Date  . Abdominal surgery    . Suprapubic catheter insertion         History of present illness and  Hospital Course:     Kindly see H&P for history of present illness and admission details, please review complete Labs, Consult reports and Test reports for all details in brief  HPI  from the history and physical done on the day of admission Admitted on May 16 th  for chest pain, abdominal pain. Found to have UTI. Because of chest pain patient admitted to telemetry.   Hospital Course   #1 chest pain musculoskeletal: Patient troponins have been negative 3. Initially received aspirin, statins, heparin drip. But patient's chest pain resolved with pain medicine. EKG did not show any new changes. Patient chest pain he started muscle skeletal origin. He remained asymptomatic. Did not require any further workup.  #2 abdominal pain  secondary to UTI, urine culture showed Escherichia coli more than 100,000 colonies. Patient has history of chronic urinary retention, SPC is placed by [name] . Patient started And because of bladder spasms and according to him it helped him a lot. And is eager to go home. Initially he received Zosyn while in the hospital. Discharge home with Macrodantin 100 mg by mouth twice a day for 7 days, patient can follow-up with Dr. Darlys Odonnell  his in the clinic. History of  urine retention before, had a cystoscopy showing no abnormality, normal PSA. And complains of more abdominal discomfort since the [name] is changed to bigger one,  He  takes Flomax, continue that  #3 chronic leg pains: Patient can see primary doctor regarding this. Patient takes chronic pain medications. I will not give him any prescriptions of this narcotics.   Discharge Condition: stable   Follow UP      Discharge Instructions  and  Discharge Medications        Medication List    STOP taking these medications        levofloxacin 750 MG tablet  Commonly known as:  LEVAQUIN     metroNIDAZOLE 500 MG tablet  Commonly known as:  FLAGYL      TAKE these medications        acidophilus Caps capsule  Take 2 capsules by mouth daily.     diphenhydramine-acetaminophen 25-500 MG Tabs tablet  Commonly known as:  TYLENOL PM  Take 1 tablet by mouth at bedtime as needed (for sleep/pain).     nitrofurantoin 100 MG capsule  Commonly known as:  MACRODANTIN  Take 1 capsule (100 mg total) by mouth 2 (two) times daily.     oxybutynin 5 MG tablet  Commonly known as:  DITROPAN  Take 1 tablet (5 mg total) by mouth 3 (three) times daily.     oxyCODONE-acetaminophen 5-325 MG tablet  Commonly known as:  PERCOCET/ROXICET  Take 2 tablets by mouth every 6 (six) hours as needed for moderate pain.     tamsulosin 0.4 MG Caps capsule  Commonly known as:  FLOMAX  Take 1 capsule (0.4 mg total) by mouth daily.          Diet and Activity  recommendation: See Discharge Instructions above   Consults obtained - none  Major procedures and Radiology Reports - PLEASE review detailed and final reports for all details, in brief -     Dg Chest 2 View  09/10/2015  CLINICAL DATA:  Acute chest pain. EXAM: CHEST  2 VIEW COMPARISON:  November 04, 2011. FINDINGS: The heart size and mediastinal contours are within normal limits. Both lungs are clear. The visualized skeletal structures are unremarkable. No pneumothorax or pleural effusion is noted. Stable elevation of left hemidiaphragm is noted. IMPRESSION: No active cardiopulmonary disease. Electronically Signed   By: [name], M.D.   On: 09/10/2015 14:55   Ct Abdomen Pelvis W Contrast  09/10/2015  CLINICAL DATA:  Hematuria chest pain EXAM: CT ANGIOGRAPHY CHEST CT ABDOMEN AND PELVIS WITH CONTRAST TECHNIQUE: Multidetector CT imaging of the chest was performed using the standard protocol during bolus administration of intravenous contrast. Multiplanar CT image reconstructions and MIPs were obtained to evaluate the vascular anatomy. Multidetector CT imaging of the abdomen and pelvis was performed using the standard protocol during bolus administration of intravenous contrast. CONTRAST:  100 mL Isovue 370. COMPARISON:  None. FINDINGS: CTA CHEST FINDINGS Lungs are well aerated bilaterally and demonstrate very mild emphysematous changes. No focal infiltrate, effusion or parenchymal nodule is seen. The thoracic inlet is within normal limits. The thoracic aorta in its branches are unremarkable. The pulmonary artery shows a normal branching pattern bilaterally. No filling defects are identified to suggest pulmonary embolism. No hilar or mediastinal adenopathy is seen. Mild coronary calcifications are noted. The osseous structures show scattered small sclerotic lesions throughout the rib cage and spine. Clinical correlation as to any possible prostate carcinoma is recommended. CT ABDOMEN and PELVIS FINDINGS  The liver is diffusely decreased in attenuation consistent with fatty infiltration. The gallbladder, spleen, left adrenal gland and pancreas are within normal limits. The right adrenal gland demonstrates a stable adenoma. The kidneys are well visualized bilaterally. No renal calculi or obstructive changes are seen. Delayed images demonstrate normal excretion of contrast. Multiple small non sizable retroperitoneal lymph nodes are seen. A suprapubic Foley catheter is noted in satisfactory position. The bladder is decompressed. No pelvic mass lesion is noted. No sidewall abnormality is seen. The appendix is not well visualized. No inflammatory changes are seen. No evidence of diverticulitis is noted. The osseous structures show scattered sclerotic foci. These may simply represent bone islands although the possibility of prostate metastatic disease could not be totally excluded. Further workup is recommended. Review of the MIP images confirms the above findings. IMPRESSION: No evidence of pulmonary emboli. Fatty liver. Stable right adrenal adenoma. Scattered sclerotic foci within the bony structures of the chest and abdomen. Although these may represent bone islands they have not present on prior exam from 2010. The possibility of prostatic metastatic disease deserves consideration. Further evaluation is  recommended. Electronically Signed   By: Inez Catalina M.D.   On: 09/10/2015 17:44   Ct Angio Chest Aorta W/cm &/or Wo/cm  09/10/2015  CLINICAL DATA:  Hematuria chest pain EXAM: CT ANGIOGRAPHY CHEST CT ABDOMEN AND PELVIS WITH CONTRAST TECHNIQUE: Multidetector CT imaging of the chest was performed using the standard protocol during bolus administration of intravenous contrast. Multiplanar CT image reconstructions and MIPs were obtained to evaluate the vascular anatomy. Multidetector CT imaging of the abdomen and pelvis was performed using the standard protocol during bolus administration of intravenous contrast.  CONTRAST:  100 mL Isovue 370. COMPARISON:  None. FINDINGS: CTA CHEST FINDINGS Lungs are well aerated bilaterally and demonstrate very mild emphysematous changes. No focal infiltrate, effusion or parenchymal nodule is seen. The thoracic inlet is within normal limits. The thoracic aorta in its branches are unremarkable. The pulmonary artery shows a normal branching pattern bilaterally. No filling defects are identified to suggest pulmonary embolism. No hilar or mediastinal adenopathy is seen. Mild coronary calcifications are noted. The osseous structures show scattered small sclerotic lesions throughout the rib cage and spine. Clinical correlation as to any possible prostate carcinoma is recommended. CT ABDOMEN and PELVIS FINDINGS The liver is diffusely decreased in attenuation consistent with fatty infiltration. The gallbladder, spleen, left adrenal gland and pancreas are within normal limits. The right adrenal gland demonstrates a stable adenoma. The kidneys are well visualized bilaterally. No renal calculi or obstructive changes are seen. Delayed images demonstrate normal excretion of contrast. Multiple small non sizable retroperitoneal lymph nodes are seen. A suprapubic Foley catheter is noted in satisfactory position. The bladder is decompressed. No pelvic mass lesion is noted. No sidewall abnormality is seen. The appendix is not well visualized. No inflammatory changes are seen. No evidence of diverticulitis is noted. The osseous structures show scattered sclerotic foci. These may simply represent bone islands although the possibility of prostate metastatic disease could not be totally excluded. Further workup is recommended. Review of the MIP images confirms the above findings. IMPRESSION: No evidence of pulmonary emboli. Fatty liver. Stable right adrenal adenoma. Scattered sclerotic foci within the bony structures of the chest and abdomen. Although these may represent bone islands they have not present on  prior exam from 2010. The possibility of prostatic metastatic disease deserves consideration. Further evaluation is recommended. Electronically Signed   By: Inez Catalina M.D.   On: 09/10/2015 17:44    Micro Results    Recent Results (from the past 240 hour(s))  Urine culture     Status: Abnormal   Collection Time: 09/10/15  2:23 PM  Result Value Ref Range Status   Specimen Description URINE, CLEAN CATCH  Final   Special Requests NONE  Final   Culture >=100,000 COLONIES/mL ESCHERICHIA COLI (A)  Final   Report Status 09/13/2015 FINAL  Final   Organism ID, Bacteria ESCHERICHIA COLI (A)  Final      Susceptibility   Escherichia coli - MIC*    AMPICILLIN >=32 RESISTANT Resistant     CEFAZOLIN <=4 SENSITIVE Sensitive     CEFTRIAXONE <=1 SENSITIVE Sensitive     CIPROFLOXACIN >=4 RESISTANT Resistant     GENTAMICIN >=16 RESISTANT Resistant     IMIPENEM <=0.25 SENSITIVE Sensitive     NITROFURANTOIN <=16 SENSITIVE Sensitive     TRIMETH/SULFA >=320 RESISTANT Resistant     AMPICILLIN/SULBACTAM 16 INTERMEDIATE Intermediate     PIP/TAZO <=4 SENSITIVE Sensitive     Extended ESBL NEGATIVE Sensitive     * >=100,000 COLONIES/mL ESCHERICHIA COLI  Blood Culture (routine x 2)     Status: None (Preliminary result)   Collection Time: 09/10/15  3:31 PM  Result Value Ref Range Status   Specimen Description BLOOD RIGHT ASSIST CONTROL  Final   Special Requests BOTTLES DRAWN AEROBIC AND ANAEROBIC [id number]  Final   Culture NO GROWTH 2 DAYS  Final   Report Status PENDING  Incomplete  Blood Culture (routine x 2)     Status: None (Preliminary result)   Collection Time: 09/10/15  3:31 PM  Result Value Ref Range Status   Specimen Description BLOOD LEFT ASSIST CONTROL  Final   Special Requests BOTTLES DRAWN AEROBIC AND ANAEROBIC [place]  Final   Culture NO GROWTH 2 DAYS  Final   Report Status PENDING  Incomplete       Today   Subjective:   Gerald Odonnell today has no headache,no chest  abdominal pain,no new weakness tingling or numbness, feels much better wants to go home today.   Objective:   Blood pressure 132/83, pulse 69, temperature 98.3 F (36.8 C), temperature source Oral, resp. rate 20, height 5' 10.5" (1.791 m), weight 111.222 kg (245 lb 3.2 oz), SpO2 98 %.   Intake/Output Summary (Last 24 hours) at 09/13/15 1050 Last data filed at 09/13/15 0830  Gross per 24 hour  Intake   1305 ml  Output   1750 ml  Net   -445 ml    Exam Awake Alert, Oriented x 3, No new F.N deficits, Normal affect [place].AT,PERRAL Supple Neck,No JVD, No cervical lymphadenopathy appriciated.  Symmetrical Chest wall movement, Good air movement bilaterally, CTAB RRR,No Gallops,Rubs or new Murmurs, No Parasternal Heave +ve B.Sounds, Abd Soft, Non tender, No organomegaly appriciated, No rebound -guarding or rigidity. No Cyanosis, Clubbing or edema, No new Rash or bruise  Data Review   CBC w Diff: Lab Results  Component Value Date   WBC 8.3 09/11/2015   WBC 5.9 05/18/2013   HGB 13.8 09/11/2015   HGB 14.2 05/18/2013   HCT 39.8* 09/11/2015   HCT 41.8 05/18/2013   PLT 111* 09/11/2015   PLT 139* 05/18/2013   LYMPHOPCT 11 04/23/2015   LYMPHOPCT 25.5 05/18/2013   MONOPCT 11 04/23/2015   MONOPCT 8.9 05/18/2013   EOSPCT 0 04/23/2015   EOSPCT 3.5 05/18/2013   BASOPCT 1 04/23/2015   BASOPCT 1.5 05/18/2013    CMP: Lab Results  Component Value Date   NA 140 09/11/2015   NA 139 11/01/2013   K 3.7 09/11/2015   K 4.0 11/01/2013   CL 108 09/11/2015   CL 103 11/01/2013   CO2 29 09/11/2015   CO2 29 11/01/2013   BUN 11 09/11/2015   BUN 11 11/01/2013   CREATININE 0.91 09/13/2015   CREATININE 0.85 11/01/2013   PROT 8.1 09/10/2015   PROT 7.5 11/01/2013   ALBUMIN 4.5 09/10/2015   ALBUMIN 3.9 11/01/2013   BILITOT 0.9 09/10/2015   BILITOT 0.6 11/01/2013   ALKPHOS 104 09/10/2015   ALKPHOS 84 11/01/2013   AST 38 09/10/2015   AST 24 11/01/2013   ALT 26 09/10/2015   ALT 24 11/01/2013   .   Total Time in preparing paper work, data evaluation and todays exam - 70 minutes  Aydian Dimmick M.D on 09/13/2015 at 10:50 AM    Note: This dictation was prepared with Dragon dictation along with smaller phrase technology. Any transcriptional errors that result from this process are unintentional.

## 2015-09-13 NOTE — Progress Notes (Signed)
Patient received discharge instructions, pt verbalized understanding. IV was removed with no signs of infection. Dressing clean, dry intact. No skin tears or wounds present. Prescription was faxed to pharmacy of choice. Patient was escorted out with staff member via wheelchair via private auto. No further needs from care management team.

## 2015-09-15 LAB — CULTURE, BLOOD (ROUTINE X 2)
CULTURE: NO GROWTH
CULTURE: NO GROWTH

## 2015-11-07 DIAGNOSIS — N401 Enlarged prostate with lower urinary tract symptoms: Secondary | ICD-10-CM | POA: Diagnosis not present

## 2015-11-07 DIAGNOSIS — N138 Other obstructive and reflux uropathy: Secondary | ICD-10-CM | POA: Diagnosis not present

## 2015-11-07 DIAGNOSIS — R339 Retention of urine, unspecified: Secondary | ICD-10-CM | POA: Diagnosis not present

## 2015-11-20 DIAGNOSIS — R339 Retention of urine, unspecified: Secondary | ICD-10-CM | POA: Diagnosis not present

## 2015-11-20 DIAGNOSIS — I10 Essential (primary) hypertension: Secondary | ICD-10-CM | POA: Diagnosis not present

## 2015-11-20 DIAGNOSIS — Z01818 Encounter for other preprocedural examination: Secondary | ICD-10-CM | POA: Diagnosis not present

## 2015-11-27 DIAGNOSIS — R972 Elevated prostate specific antigen [PSA]: Secondary | ICD-10-CM | POA: Diagnosis not present

## 2015-11-27 DIAGNOSIS — R339 Retention of urine, unspecified: Secondary | ICD-10-CM | POA: Diagnosis not present

## 2015-11-27 DIAGNOSIS — N3289 Other specified disorders of bladder: Secondary | ICD-10-CM | POA: Diagnosis not present

## 2015-11-27 DIAGNOSIS — F17200 Nicotine dependence, unspecified, uncomplicated: Secondary | ICD-10-CM | POA: Diagnosis not present

## 2015-11-27 DIAGNOSIS — C61 Malignant neoplasm of prostate: Secondary | ICD-10-CM | POA: Diagnosis not present

## 2015-11-27 DIAGNOSIS — Z79899 Other long term (current) drug therapy: Secondary | ICD-10-CM | POA: Diagnosis not present

## 2015-12-10 DIAGNOSIS — R937 Abnormal findings on diagnostic imaging of other parts of musculoskeletal system: Secondary | ICD-10-CM | POA: Diagnosis not present

## 2015-12-10 DIAGNOSIS — C61 Malignant neoplasm of prostate: Secondary | ICD-10-CM | POA: Diagnosis not present

## 2015-12-13 DIAGNOSIS — R590 Localized enlarged lymph nodes: Secondary | ICD-10-CM | POA: Diagnosis not present

## 2015-12-13 DIAGNOSIS — C61 Malignant neoplasm of prostate: Secondary | ICD-10-CM | POA: Diagnosis not present

## 2015-12-13 DIAGNOSIS — M899 Disorder of bone, unspecified: Secondary | ICD-10-CM | POA: Diagnosis not present

## 2016-01-20 DIAGNOSIS — M899 Disorder of bone, unspecified: Secondary | ICD-10-CM | POA: Diagnosis not present

## 2016-01-20 DIAGNOSIS — C61 Malignant neoplasm of prostate: Secondary | ICD-10-CM | POA: Diagnosis not present

## 2016-01-20 DIAGNOSIS — R339 Retention of urine, unspecified: Secondary | ICD-10-CM | POA: Diagnosis not present

## 2016-01-20 DIAGNOSIS — N312 Flaccid neuropathic bladder, not elsewhere classified: Secondary | ICD-10-CM | POA: Diagnosis not present

## 2016-01-20 DIAGNOSIS — Z85038 Personal history of other malignant neoplasm of large intestine: Secondary | ICD-10-CM | POA: Diagnosis not present

## 2016-01-20 DIAGNOSIS — Z960 Presence of urogenital implants: Secondary | ICD-10-CM | POA: Diagnosis not present

## 2016-01-20 DIAGNOSIS — Z9049 Acquired absence of other specified parts of digestive tract: Secondary | ICD-10-CM | POA: Diagnosis not present

## 2016-01-20 DIAGNOSIS — N320 Bladder-neck obstruction: Secondary | ICD-10-CM | POA: Diagnosis not present

## 2016-01-20 DIAGNOSIS — G4730 Sleep apnea, unspecified: Secondary | ICD-10-CM | POA: Diagnosis not present

## 2016-01-23 DIAGNOSIS — G4730 Sleep apnea, unspecified: Secondary | ICD-10-CM | POA: Diagnosis not present

## 2016-01-23 DIAGNOSIS — T8029XA Infection following other infusion, transfusion and therapeutic injection, initial encounter: Secondary | ICD-10-CM | POA: Diagnosis not present

## 2016-01-23 DIAGNOSIS — R000 Tachycardia, unspecified: Secondary | ICD-10-CM | POA: Diagnosis not present

## 2016-01-23 DIAGNOSIS — F17210 Nicotine dependence, cigarettes, uncomplicated: Secondary | ICD-10-CM | POA: Diagnosis not present

## 2016-01-23 DIAGNOSIS — C61 Malignant neoplasm of prostate: Secondary | ICD-10-CM | POA: Diagnosis not present

## 2016-01-23 DIAGNOSIS — K4020 Bilateral inguinal hernia, without obstruction or gangrene, not specified as recurrent: Secondary | ICD-10-CM | POA: Diagnosis not present

## 2016-01-23 DIAGNOSIS — L03311 Cellulitis of abdominal wall: Secondary | ICD-10-CM | POA: Diagnosis not present

## 2016-01-23 DIAGNOSIS — E278 Other specified disorders of adrenal gland: Secondary | ICD-10-CM | POA: Diagnosis not present

## 2016-01-23 DIAGNOSIS — R222 Localized swelling, mass and lump, trunk: Secondary | ICD-10-CM | POA: Diagnosis not present

## 2016-01-23 DIAGNOSIS — L0291 Cutaneous abscess, unspecified: Secondary | ICD-10-CM | POA: Diagnosis not present

## 2016-01-23 DIAGNOSIS — L02211 Cutaneous abscess of abdominal wall: Secondary | ICD-10-CM | POA: Diagnosis not present

## 2016-01-23 DIAGNOSIS — K651 Peritoneal abscess: Secondary | ICD-10-CM | POA: Diagnosis not present

## 2016-01-23 DIAGNOSIS — Z85038 Personal history of other malignant neoplasm of large intestine: Secondary | ICD-10-CM | POA: Diagnosis not present

## 2016-01-24 DIAGNOSIS — L0291 Cutaneous abscess, unspecified: Secondary | ICD-10-CM | POA: Insufficient documentation

## 2016-01-30 DIAGNOSIS — Z85038 Personal history of other malignant neoplasm of large intestine: Secondary | ICD-10-CM | POA: Diagnosis not present

## 2016-01-30 DIAGNOSIS — F17210 Nicotine dependence, cigarettes, uncomplicated: Secondary | ICD-10-CM | POA: Diagnosis not present

## 2016-01-30 DIAGNOSIS — Z800 Family history of malignant neoplasm of digestive organs: Secondary | ICD-10-CM | POA: Diagnosis not present

## 2016-01-30 DIAGNOSIS — N3289 Other specified disorders of bladder: Secondary | ICD-10-CM | POA: Diagnosis not present

## 2016-01-30 DIAGNOSIS — R339 Retention of urine, unspecified: Secondary | ICD-10-CM | POA: Diagnosis not present

## 2016-01-30 DIAGNOSIS — C61 Malignant neoplasm of prostate: Secondary | ICD-10-CM | POA: Diagnosis not present

## 2016-02-04 DIAGNOSIS — L03329 Acute lymphangitis of trunk, unspecified: Secondary | ICD-10-CM | POA: Diagnosis not present

## 2016-02-04 DIAGNOSIS — M79605 Pain in left leg: Secondary | ICD-10-CM | POA: Diagnosis not present

## 2016-02-04 DIAGNOSIS — Z8546 Personal history of malignant neoplasm of prostate: Secondary | ICD-10-CM | POA: Diagnosis not present

## 2016-02-20 DIAGNOSIS — G8929 Other chronic pain: Secondary | ICD-10-CM | POA: Diagnosis not present

## 2016-02-20 DIAGNOSIS — N312 Flaccid neuropathic bladder, not elsewhere classified: Secondary | ICD-10-CM | POA: Diagnosis not present

## 2016-02-20 DIAGNOSIS — R339 Retention of urine, unspecified: Secondary | ICD-10-CM | POA: Diagnosis not present

## 2016-02-20 DIAGNOSIS — G4733 Obstructive sleep apnea (adult) (pediatric): Secondary | ICD-10-CM | POA: Diagnosis not present

## 2016-02-20 DIAGNOSIS — R102 Pelvic and perineal pain: Secondary | ICD-10-CM | POA: Diagnosis not present

## 2016-02-20 DIAGNOSIS — I878 Other specified disorders of veins: Secondary | ICD-10-CM | POA: Diagnosis not present

## 2016-02-20 DIAGNOSIS — R938 Abnormal findings on diagnostic imaging of other specified body structures: Secondary | ICD-10-CM | POA: Diagnosis not present

## 2016-02-20 DIAGNOSIS — F17210 Nicotine dependence, cigarettes, uncomplicated: Secondary | ICD-10-CM | POA: Diagnosis not present

## 2016-02-20 DIAGNOSIS — R600 Localized edema: Secondary | ICD-10-CM | POA: Diagnosis not present

## 2016-02-20 DIAGNOSIS — C639 Malignant neoplasm of male genital organ, unspecified: Secondary | ICD-10-CM | POA: Diagnosis not present

## 2016-02-20 DIAGNOSIS — C61 Malignant neoplasm of prostate: Secondary | ICD-10-CM | POA: Diagnosis not present

## 2016-02-20 DIAGNOSIS — R918 Other nonspecific abnormal finding of lung field: Secondary | ICD-10-CM | POA: Diagnosis not present

## 2016-02-28 DIAGNOSIS — C61 Malignant neoplasm of prostate: Secondary | ICD-10-CM | POA: Diagnosis not present

## 2016-03-03 DIAGNOSIS — C61 Malignant neoplasm of prostate: Secondary | ICD-10-CM | POA: Diagnosis not present

## 2016-03-03 DIAGNOSIS — E669 Obesity, unspecified: Secondary | ICD-10-CM | POA: Diagnosis not present

## 2016-03-03 DIAGNOSIS — G4733 Obstructive sleep apnea (adult) (pediatric): Secondary | ICD-10-CM | POA: Diagnosis not present

## 2016-03-03 DIAGNOSIS — Z6834 Body mass index (BMI) 34.0-34.9, adult: Secondary | ICD-10-CM | POA: Diagnosis not present

## 2016-03-03 DIAGNOSIS — Z85038 Personal history of other malignant neoplasm of large intestine: Secondary | ICD-10-CM | POA: Diagnosis not present

## 2016-03-03 DIAGNOSIS — Z9049 Acquired absence of other specified parts of digestive tract: Secondary | ICD-10-CM | POA: Diagnosis not present

## 2016-03-12 DIAGNOSIS — Z800 Family history of malignant neoplasm of digestive organs: Secondary | ICD-10-CM | POA: Diagnosis not present

## 2016-03-12 DIAGNOSIS — F17210 Nicotine dependence, cigarettes, uncomplicated: Secondary | ICD-10-CM | POA: Diagnosis not present

## 2016-03-12 DIAGNOSIS — C61 Malignant neoplasm of prostate: Secondary | ICD-10-CM | POA: Diagnosis not present

## 2016-03-12 DIAGNOSIS — N3289 Other specified disorders of bladder: Secondary | ICD-10-CM | POA: Diagnosis not present

## 2016-03-12 DIAGNOSIS — R339 Retention of urine, unspecified: Secondary | ICD-10-CM | POA: Diagnosis not present

## 2016-03-12 DIAGNOSIS — Z85038 Personal history of other malignant neoplasm of large intestine: Secondary | ICD-10-CM | POA: Diagnosis not present

## 2016-03-16 DIAGNOSIS — G8929 Other chronic pain: Secondary | ICD-10-CM | POA: Diagnosis not present

## 2016-03-26 DIAGNOSIS — R339 Retention of urine, unspecified: Secondary | ICD-10-CM | POA: Diagnosis not present

## 2016-03-26 DIAGNOSIS — Z85038 Personal history of other malignant neoplasm of large intestine: Secondary | ICD-10-CM | POA: Diagnosis not present

## 2016-03-26 DIAGNOSIS — F17210 Nicotine dependence, cigarettes, uncomplicated: Secondary | ICD-10-CM | POA: Diagnosis not present

## 2016-03-26 DIAGNOSIS — C61 Malignant neoplasm of prostate: Secondary | ICD-10-CM | POA: Diagnosis not present

## 2016-03-26 DIAGNOSIS — Z800 Family history of malignant neoplasm of digestive organs: Secondary | ICD-10-CM | POA: Diagnosis not present

## 2016-03-26 DIAGNOSIS — N3289 Other specified disorders of bladder: Secondary | ICD-10-CM | POA: Diagnosis not present

## 2016-03-30 DIAGNOSIS — Z800 Family history of malignant neoplasm of digestive organs: Secondary | ICD-10-CM | POA: Diagnosis not present

## 2016-03-30 DIAGNOSIS — Z85038 Personal history of other malignant neoplasm of large intestine: Secondary | ICD-10-CM | POA: Diagnosis not present

## 2016-03-30 DIAGNOSIS — N3289 Other specified disorders of bladder: Secondary | ICD-10-CM | POA: Diagnosis not present

## 2016-03-30 DIAGNOSIS — C61 Malignant neoplasm of prostate: Secondary | ICD-10-CM | POA: Diagnosis not present

## 2016-03-30 DIAGNOSIS — R339 Retention of urine, unspecified: Secondary | ICD-10-CM | POA: Diagnosis not present

## 2016-03-30 DIAGNOSIS — F17210 Nicotine dependence, cigarettes, uncomplicated: Secondary | ICD-10-CM | POA: Diagnosis not present

## 2016-03-31 DIAGNOSIS — Z800 Family history of malignant neoplasm of digestive organs: Secondary | ICD-10-CM | POA: Diagnosis not present

## 2016-03-31 DIAGNOSIS — Z85038 Personal history of other malignant neoplasm of large intestine: Secondary | ICD-10-CM | POA: Diagnosis not present

## 2016-03-31 DIAGNOSIS — R339 Retention of urine, unspecified: Secondary | ICD-10-CM | POA: Diagnosis not present

## 2016-03-31 DIAGNOSIS — N3289 Other specified disorders of bladder: Secondary | ICD-10-CM | POA: Diagnosis not present

## 2016-03-31 DIAGNOSIS — F17210 Nicotine dependence, cigarettes, uncomplicated: Secondary | ICD-10-CM | POA: Diagnosis not present

## 2016-03-31 DIAGNOSIS — C61 Malignant neoplasm of prostate: Secondary | ICD-10-CM | POA: Diagnosis not present

## 2016-04-01 DIAGNOSIS — R339 Retention of urine, unspecified: Secondary | ICD-10-CM | POA: Diagnosis not present

## 2016-04-01 DIAGNOSIS — Z85038 Personal history of other malignant neoplasm of large intestine: Secondary | ICD-10-CM | POA: Diagnosis not present

## 2016-04-01 DIAGNOSIS — N3289 Other specified disorders of bladder: Secondary | ICD-10-CM | POA: Diagnosis not present

## 2016-04-01 DIAGNOSIS — Z800 Family history of malignant neoplasm of digestive organs: Secondary | ICD-10-CM | POA: Diagnosis not present

## 2016-04-01 DIAGNOSIS — C61 Malignant neoplasm of prostate: Secondary | ICD-10-CM | POA: Diagnosis not present

## 2016-04-01 DIAGNOSIS — F17210 Nicotine dependence, cigarettes, uncomplicated: Secondary | ICD-10-CM | POA: Diagnosis not present

## 2016-04-02 DIAGNOSIS — N3289 Other specified disorders of bladder: Secondary | ICD-10-CM | POA: Diagnosis not present

## 2016-04-02 DIAGNOSIS — C61 Malignant neoplasm of prostate: Secondary | ICD-10-CM | POA: Diagnosis not present

## 2016-04-02 DIAGNOSIS — Z800 Family history of malignant neoplasm of digestive organs: Secondary | ICD-10-CM | POA: Diagnosis not present

## 2016-04-02 DIAGNOSIS — Z85038 Personal history of other malignant neoplasm of large intestine: Secondary | ICD-10-CM | POA: Diagnosis not present

## 2016-04-02 DIAGNOSIS — R339 Retention of urine, unspecified: Secondary | ICD-10-CM | POA: Diagnosis not present

## 2016-04-02 DIAGNOSIS — F17210 Nicotine dependence, cigarettes, uncomplicated: Secondary | ICD-10-CM | POA: Diagnosis not present

## 2016-04-03 DIAGNOSIS — F17210 Nicotine dependence, cigarettes, uncomplicated: Secondary | ICD-10-CM | POA: Diagnosis not present

## 2016-04-03 DIAGNOSIS — N3289 Other specified disorders of bladder: Secondary | ICD-10-CM | POA: Diagnosis not present

## 2016-04-03 DIAGNOSIS — C61 Malignant neoplasm of prostate: Secondary | ICD-10-CM | POA: Diagnosis not present

## 2016-04-03 DIAGNOSIS — Z800 Family history of malignant neoplasm of digestive organs: Secondary | ICD-10-CM | POA: Diagnosis not present

## 2016-04-03 DIAGNOSIS — R339 Retention of urine, unspecified: Secondary | ICD-10-CM | POA: Diagnosis not present

## 2016-04-03 DIAGNOSIS — Z85038 Personal history of other malignant neoplasm of large intestine: Secondary | ICD-10-CM | POA: Diagnosis not present

## 2016-04-06 DIAGNOSIS — Z85038 Personal history of other malignant neoplasm of large intestine: Secondary | ICD-10-CM | POA: Diagnosis not present

## 2016-04-06 DIAGNOSIS — N3289 Other specified disorders of bladder: Secondary | ICD-10-CM | POA: Diagnosis not present

## 2016-04-06 DIAGNOSIS — R339 Retention of urine, unspecified: Secondary | ICD-10-CM | POA: Diagnosis not present

## 2016-04-06 DIAGNOSIS — C61 Malignant neoplasm of prostate: Secondary | ICD-10-CM | POA: Diagnosis not present

## 2016-04-06 DIAGNOSIS — Z800 Family history of malignant neoplasm of digestive organs: Secondary | ICD-10-CM | POA: Diagnosis not present

## 2016-04-06 DIAGNOSIS — F17210 Nicotine dependence, cigarettes, uncomplicated: Secondary | ICD-10-CM | POA: Diagnosis not present

## 2016-04-07 DIAGNOSIS — R339 Retention of urine, unspecified: Secondary | ICD-10-CM | POA: Diagnosis not present

## 2016-04-07 DIAGNOSIS — N3289 Other specified disorders of bladder: Secondary | ICD-10-CM | POA: Diagnosis not present

## 2016-04-07 DIAGNOSIS — Z800 Family history of malignant neoplasm of digestive organs: Secondary | ICD-10-CM | POA: Diagnosis not present

## 2016-04-07 DIAGNOSIS — Z85038 Personal history of other malignant neoplasm of large intestine: Secondary | ICD-10-CM | POA: Diagnosis not present

## 2016-04-07 DIAGNOSIS — F17210 Nicotine dependence, cigarettes, uncomplicated: Secondary | ICD-10-CM | POA: Diagnosis not present

## 2016-04-07 DIAGNOSIS — C61 Malignant neoplasm of prostate: Secondary | ICD-10-CM | POA: Diagnosis not present

## 2016-04-08 DIAGNOSIS — F17210 Nicotine dependence, cigarettes, uncomplicated: Secondary | ICD-10-CM | POA: Diagnosis not present

## 2016-04-08 DIAGNOSIS — Z85038 Personal history of other malignant neoplasm of large intestine: Secondary | ICD-10-CM | POA: Diagnosis not present

## 2016-04-08 DIAGNOSIS — N3289 Other specified disorders of bladder: Secondary | ICD-10-CM | POA: Diagnosis not present

## 2016-04-08 DIAGNOSIS — C61 Malignant neoplasm of prostate: Secondary | ICD-10-CM | POA: Diagnosis not present

## 2016-04-08 DIAGNOSIS — R339 Retention of urine, unspecified: Secondary | ICD-10-CM | POA: Diagnosis not present

## 2016-04-08 DIAGNOSIS — Z800 Family history of malignant neoplasm of digestive organs: Secondary | ICD-10-CM | POA: Diagnosis not present

## 2016-04-09 DIAGNOSIS — F17210 Nicotine dependence, cigarettes, uncomplicated: Secondary | ICD-10-CM | POA: Diagnosis not present

## 2016-04-09 DIAGNOSIS — Z85038 Personal history of other malignant neoplasm of large intestine: Secondary | ICD-10-CM | POA: Diagnosis not present

## 2016-04-09 DIAGNOSIS — R339 Retention of urine, unspecified: Secondary | ICD-10-CM | POA: Diagnosis not present

## 2016-04-09 DIAGNOSIS — C61 Malignant neoplasm of prostate: Secondary | ICD-10-CM | POA: Diagnosis not present

## 2016-04-09 DIAGNOSIS — N3289 Other specified disorders of bladder: Secondary | ICD-10-CM | POA: Diagnosis not present

## 2016-04-09 DIAGNOSIS — Z800 Family history of malignant neoplasm of digestive organs: Secondary | ICD-10-CM | POA: Diagnosis not present

## 2016-04-10 DIAGNOSIS — C61 Malignant neoplasm of prostate: Secondary | ICD-10-CM | POA: Diagnosis not present

## 2016-04-10 DIAGNOSIS — F17210 Nicotine dependence, cigarettes, uncomplicated: Secondary | ICD-10-CM | POA: Diagnosis not present

## 2016-04-10 DIAGNOSIS — Z85038 Personal history of other malignant neoplasm of large intestine: Secondary | ICD-10-CM | POA: Diagnosis not present

## 2016-04-10 DIAGNOSIS — N3289 Other specified disorders of bladder: Secondary | ICD-10-CM | POA: Diagnosis not present

## 2016-04-10 DIAGNOSIS — R339 Retention of urine, unspecified: Secondary | ICD-10-CM | POA: Diagnosis not present

## 2016-04-10 DIAGNOSIS — Z800 Family history of malignant neoplasm of digestive organs: Secondary | ICD-10-CM | POA: Diagnosis not present

## 2016-04-13 DIAGNOSIS — N3289 Other specified disorders of bladder: Secondary | ICD-10-CM | POA: Diagnosis not present

## 2016-04-13 DIAGNOSIS — Z800 Family history of malignant neoplasm of digestive organs: Secondary | ICD-10-CM | POA: Diagnosis not present

## 2016-04-13 DIAGNOSIS — C61 Malignant neoplasm of prostate: Secondary | ICD-10-CM | POA: Diagnosis not present

## 2016-04-13 DIAGNOSIS — Z85038 Personal history of other malignant neoplasm of large intestine: Secondary | ICD-10-CM | POA: Diagnosis not present

## 2016-04-13 DIAGNOSIS — F17210 Nicotine dependence, cigarettes, uncomplicated: Secondary | ICD-10-CM | POA: Diagnosis not present

## 2016-04-13 DIAGNOSIS — R339 Retention of urine, unspecified: Secondary | ICD-10-CM | POA: Diagnosis not present

## 2016-04-14 DIAGNOSIS — N3289 Other specified disorders of bladder: Secondary | ICD-10-CM | POA: Diagnosis not present

## 2016-04-14 DIAGNOSIS — Z800 Family history of malignant neoplasm of digestive organs: Secondary | ICD-10-CM | POA: Diagnosis not present

## 2016-04-14 DIAGNOSIS — Z85038 Personal history of other malignant neoplasm of large intestine: Secondary | ICD-10-CM | POA: Diagnosis not present

## 2016-04-14 DIAGNOSIS — R339 Retention of urine, unspecified: Secondary | ICD-10-CM | POA: Diagnosis not present

## 2016-04-14 DIAGNOSIS — F17210 Nicotine dependence, cigarettes, uncomplicated: Secondary | ICD-10-CM | POA: Diagnosis not present

## 2016-04-14 DIAGNOSIS — C61 Malignant neoplasm of prostate: Secondary | ICD-10-CM | POA: Diagnosis not present

## 2016-04-15 DIAGNOSIS — Z85038 Personal history of other malignant neoplasm of large intestine: Secondary | ICD-10-CM | POA: Diagnosis not present

## 2016-04-15 DIAGNOSIS — Z800 Family history of malignant neoplasm of digestive organs: Secondary | ICD-10-CM | POA: Diagnosis not present

## 2016-04-15 DIAGNOSIS — C61 Malignant neoplasm of prostate: Secondary | ICD-10-CM | POA: Diagnosis not present

## 2016-04-15 DIAGNOSIS — R339 Retention of urine, unspecified: Secondary | ICD-10-CM | POA: Diagnosis not present

## 2016-04-15 DIAGNOSIS — F17210 Nicotine dependence, cigarettes, uncomplicated: Secondary | ICD-10-CM | POA: Diagnosis not present

## 2016-04-15 DIAGNOSIS — N3289 Other specified disorders of bladder: Secondary | ICD-10-CM | POA: Diagnosis not present

## 2016-04-16 DIAGNOSIS — F17210 Nicotine dependence, cigarettes, uncomplicated: Secondary | ICD-10-CM | POA: Diagnosis not present

## 2016-04-16 DIAGNOSIS — Z85038 Personal history of other malignant neoplasm of large intestine: Secondary | ICD-10-CM | POA: Diagnosis not present

## 2016-04-16 DIAGNOSIS — R339 Retention of urine, unspecified: Secondary | ICD-10-CM | POA: Diagnosis not present

## 2016-04-16 DIAGNOSIS — Z800 Family history of malignant neoplasm of digestive organs: Secondary | ICD-10-CM | POA: Diagnosis not present

## 2016-04-16 DIAGNOSIS — N3289 Other specified disorders of bladder: Secondary | ICD-10-CM | POA: Diagnosis not present

## 2016-04-16 DIAGNOSIS — C61 Malignant neoplasm of prostate: Secondary | ICD-10-CM | POA: Diagnosis not present

## 2016-04-17 DIAGNOSIS — Z85038 Personal history of other malignant neoplasm of large intestine: Secondary | ICD-10-CM | POA: Diagnosis not present

## 2016-04-17 DIAGNOSIS — Z800 Family history of malignant neoplasm of digestive organs: Secondary | ICD-10-CM | POA: Diagnosis not present

## 2016-04-17 DIAGNOSIS — C61 Malignant neoplasm of prostate: Secondary | ICD-10-CM | POA: Diagnosis not present

## 2016-04-17 DIAGNOSIS — R339 Retention of urine, unspecified: Secondary | ICD-10-CM | POA: Diagnosis not present

## 2016-04-17 DIAGNOSIS — F17210 Nicotine dependence, cigarettes, uncomplicated: Secondary | ICD-10-CM | POA: Diagnosis not present

## 2016-04-17 DIAGNOSIS — N3289 Other specified disorders of bladder: Secondary | ICD-10-CM | POA: Diagnosis not present

## 2016-04-21 DIAGNOSIS — C61 Malignant neoplasm of prostate: Secondary | ICD-10-CM | POA: Diagnosis not present

## 2016-04-21 DIAGNOSIS — Z85038 Personal history of other malignant neoplasm of large intestine: Secondary | ICD-10-CM | POA: Diagnosis not present

## 2016-04-21 DIAGNOSIS — N3289 Other specified disorders of bladder: Secondary | ICD-10-CM | POA: Diagnosis not present

## 2016-04-21 DIAGNOSIS — Z800 Family history of malignant neoplasm of digestive organs: Secondary | ICD-10-CM | POA: Diagnosis not present

## 2016-04-21 DIAGNOSIS — F17210 Nicotine dependence, cigarettes, uncomplicated: Secondary | ICD-10-CM | POA: Diagnosis not present

## 2016-04-21 DIAGNOSIS — R339 Retention of urine, unspecified: Secondary | ICD-10-CM | POA: Diagnosis not present

## 2016-04-22 DIAGNOSIS — Z85038 Personal history of other malignant neoplasm of large intestine: Secondary | ICD-10-CM | POA: Diagnosis not present

## 2016-04-22 DIAGNOSIS — F17210 Nicotine dependence, cigarettes, uncomplicated: Secondary | ICD-10-CM | POA: Diagnosis not present

## 2016-04-22 DIAGNOSIS — C61 Malignant neoplasm of prostate: Secondary | ICD-10-CM | POA: Diagnosis not present

## 2016-04-22 DIAGNOSIS — N3289 Other specified disorders of bladder: Secondary | ICD-10-CM | POA: Diagnosis not present

## 2016-04-22 DIAGNOSIS — Z800 Family history of malignant neoplasm of digestive organs: Secondary | ICD-10-CM | POA: Diagnosis not present

## 2016-04-22 DIAGNOSIS — R339 Retention of urine, unspecified: Secondary | ICD-10-CM | POA: Diagnosis not present

## 2016-04-23 DIAGNOSIS — R339 Retention of urine, unspecified: Secondary | ICD-10-CM | POA: Diagnosis not present

## 2016-04-23 DIAGNOSIS — N3289 Other specified disorders of bladder: Secondary | ICD-10-CM | POA: Diagnosis not present

## 2016-04-23 DIAGNOSIS — F17210 Nicotine dependence, cigarettes, uncomplicated: Secondary | ICD-10-CM | POA: Diagnosis not present

## 2016-04-23 DIAGNOSIS — C61 Malignant neoplasm of prostate: Secondary | ICD-10-CM | POA: Diagnosis not present

## 2016-04-23 DIAGNOSIS — Z85038 Personal history of other malignant neoplasm of large intestine: Secondary | ICD-10-CM | POA: Diagnosis not present

## 2016-04-23 DIAGNOSIS — Z800 Family history of malignant neoplasm of digestive organs: Secondary | ICD-10-CM | POA: Diagnosis not present

## 2016-04-24 DIAGNOSIS — R339 Retention of urine, unspecified: Secondary | ICD-10-CM | POA: Diagnosis not present

## 2016-04-24 DIAGNOSIS — N3289 Other specified disorders of bladder: Secondary | ICD-10-CM | POA: Diagnosis not present

## 2016-04-24 DIAGNOSIS — Z85038 Personal history of other malignant neoplasm of large intestine: Secondary | ICD-10-CM | POA: Diagnosis not present

## 2016-04-24 DIAGNOSIS — F17210 Nicotine dependence, cigarettes, uncomplicated: Secondary | ICD-10-CM | POA: Diagnosis not present

## 2016-04-24 DIAGNOSIS — Z800 Family history of malignant neoplasm of digestive organs: Secondary | ICD-10-CM | POA: Diagnosis not present

## 2016-04-24 DIAGNOSIS — C61 Malignant neoplasm of prostate: Secondary | ICD-10-CM | POA: Diagnosis not present

## 2016-04-28 DIAGNOSIS — N3289 Other specified disorders of bladder: Secondary | ICD-10-CM | POA: Diagnosis not present

## 2016-04-28 DIAGNOSIS — Z01818 Encounter for other preprocedural examination: Secondary | ICD-10-CM | POA: Diagnosis not present

## 2016-04-28 DIAGNOSIS — Z85038 Personal history of other malignant neoplasm of large intestine: Secondary | ICD-10-CM | POA: Diagnosis not present

## 2016-04-28 DIAGNOSIS — Z87898 Personal history of other specified conditions: Secondary | ICD-10-CM | POA: Diagnosis not present

## 2016-04-28 DIAGNOSIS — Z800 Family history of malignant neoplasm of digestive organs: Secondary | ICD-10-CM | POA: Diagnosis not present

## 2016-04-28 DIAGNOSIS — F17210 Nicotine dependence, cigarettes, uncomplicated: Secondary | ICD-10-CM | POA: Diagnosis not present

## 2016-04-28 DIAGNOSIS — C61 Malignant neoplasm of prostate: Secondary | ICD-10-CM | POA: Diagnosis not present

## 2016-04-28 DIAGNOSIS — R339 Retention of urine, unspecified: Secondary | ICD-10-CM | POA: Diagnosis not present

## 2016-04-29 DIAGNOSIS — F17210 Nicotine dependence, cigarettes, uncomplicated: Secondary | ICD-10-CM | POA: Diagnosis not present

## 2016-04-29 DIAGNOSIS — R339 Retention of urine, unspecified: Secondary | ICD-10-CM | POA: Diagnosis not present

## 2016-04-29 DIAGNOSIS — Z85038 Personal history of other malignant neoplasm of large intestine: Secondary | ICD-10-CM | POA: Diagnosis not present

## 2016-04-29 DIAGNOSIS — Z87898 Personal history of other specified conditions: Secondary | ICD-10-CM | POA: Diagnosis not present

## 2016-04-29 DIAGNOSIS — N3289 Other specified disorders of bladder: Secondary | ICD-10-CM | POA: Diagnosis not present

## 2016-04-29 DIAGNOSIS — C61 Malignant neoplasm of prostate: Secondary | ICD-10-CM | POA: Diagnosis not present

## 2016-04-29 DIAGNOSIS — Z01818 Encounter for other preprocedural examination: Secondary | ICD-10-CM | POA: Diagnosis not present

## 2016-04-29 DIAGNOSIS — Z800 Family history of malignant neoplasm of digestive organs: Secondary | ICD-10-CM | POA: Diagnosis not present

## 2016-05-01 DIAGNOSIS — Z01818 Encounter for other preprocedural examination: Secondary | ICD-10-CM | POA: Diagnosis not present

## 2016-05-01 DIAGNOSIS — Z85038 Personal history of other malignant neoplasm of large intestine: Secondary | ICD-10-CM | POA: Diagnosis not present

## 2016-05-01 DIAGNOSIS — F17210 Nicotine dependence, cigarettes, uncomplicated: Secondary | ICD-10-CM | POA: Diagnosis not present

## 2016-05-01 DIAGNOSIS — Z800 Family history of malignant neoplasm of digestive organs: Secondary | ICD-10-CM | POA: Diagnosis not present

## 2016-05-01 DIAGNOSIS — Z87898 Personal history of other specified conditions: Secondary | ICD-10-CM | POA: Diagnosis not present

## 2016-05-01 DIAGNOSIS — C61 Malignant neoplasm of prostate: Secondary | ICD-10-CM | POA: Diagnosis not present

## 2016-05-01 DIAGNOSIS — R339 Retention of urine, unspecified: Secondary | ICD-10-CM | POA: Diagnosis not present

## 2016-05-01 DIAGNOSIS — N3289 Other specified disorders of bladder: Secondary | ICD-10-CM | POA: Diagnosis not present

## 2016-05-04 DIAGNOSIS — F17210 Nicotine dependence, cigarettes, uncomplicated: Secondary | ICD-10-CM | POA: Diagnosis not present

## 2016-05-04 DIAGNOSIS — Z85038 Personal history of other malignant neoplasm of large intestine: Secondary | ICD-10-CM | POA: Diagnosis not present

## 2016-05-04 DIAGNOSIS — N3289 Other specified disorders of bladder: Secondary | ICD-10-CM | POA: Diagnosis not present

## 2016-05-04 DIAGNOSIS — C61 Malignant neoplasm of prostate: Secondary | ICD-10-CM | POA: Diagnosis not present

## 2016-05-04 DIAGNOSIS — Z800 Family history of malignant neoplasm of digestive organs: Secondary | ICD-10-CM | POA: Diagnosis not present

## 2016-05-04 DIAGNOSIS — Z87898 Personal history of other specified conditions: Secondary | ICD-10-CM | POA: Diagnosis not present

## 2016-05-04 DIAGNOSIS — R339 Retention of urine, unspecified: Secondary | ICD-10-CM | POA: Diagnosis not present

## 2016-05-04 DIAGNOSIS — Z01818 Encounter for other preprocedural examination: Secondary | ICD-10-CM | POA: Diagnosis not present

## 2016-05-05 DIAGNOSIS — R339 Retention of urine, unspecified: Secondary | ICD-10-CM | POA: Diagnosis not present

## 2016-05-05 DIAGNOSIS — N3289 Other specified disorders of bladder: Secondary | ICD-10-CM | POA: Diagnosis not present

## 2016-05-05 DIAGNOSIS — Z87898 Personal history of other specified conditions: Secondary | ICD-10-CM | POA: Diagnosis not present

## 2016-05-05 DIAGNOSIS — Z800 Family history of malignant neoplasm of digestive organs: Secondary | ICD-10-CM | POA: Diagnosis not present

## 2016-05-05 DIAGNOSIS — F17210 Nicotine dependence, cigarettes, uncomplicated: Secondary | ICD-10-CM | POA: Diagnosis not present

## 2016-05-05 DIAGNOSIS — Z01818 Encounter for other preprocedural examination: Secondary | ICD-10-CM | POA: Diagnosis not present

## 2016-05-05 DIAGNOSIS — C61 Malignant neoplasm of prostate: Secondary | ICD-10-CM | POA: Diagnosis not present

## 2016-05-05 DIAGNOSIS — Z85038 Personal history of other malignant neoplasm of large intestine: Secondary | ICD-10-CM | POA: Diagnosis not present

## 2016-05-06 DIAGNOSIS — Z01818 Encounter for other preprocedural examination: Secondary | ICD-10-CM | POA: Diagnosis not present

## 2016-05-06 DIAGNOSIS — F17210 Nicotine dependence, cigarettes, uncomplicated: Secondary | ICD-10-CM | POA: Diagnosis not present

## 2016-05-06 DIAGNOSIS — Z85038 Personal history of other malignant neoplasm of large intestine: Secondary | ICD-10-CM | POA: Diagnosis not present

## 2016-05-06 DIAGNOSIS — Z87898 Personal history of other specified conditions: Secondary | ICD-10-CM | POA: Diagnosis not present

## 2016-05-06 DIAGNOSIS — R339 Retention of urine, unspecified: Secondary | ICD-10-CM | POA: Diagnosis not present

## 2016-05-06 DIAGNOSIS — N3289 Other specified disorders of bladder: Secondary | ICD-10-CM | POA: Diagnosis not present

## 2016-05-06 DIAGNOSIS — C61 Malignant neoplasm of prostate: Secondary | ICD-10-CM | POA: Diagnosis not present

## 2016-05-06 DIAGNOSIS — Z800 Family history of malignant neoplasm of digestive organs: Secondary | ICD-10-CM | POA: Diagnosis not present

## 2016-05-07 DIAGNOSIS — Z87898 Personal history of other specified conditions: Secondary | ICD-10-CM | POA: Diagnosis not present

## 2016-05-07 DIAGNOSIS — Z800 Family history of malignant neoplasm of digestive organs: Secondary | ICD-10-CM | POA: Diagnosis not present

## 2016-05-07 DIAGNOSIS — N3289 Other specified disorders of bladder: Secondary | ICD-10-CM | POA: Diagnosis not present

## 2016-05-07 DIAGNOSIS — C61 Malignant neoplasm of prostate: Secondary | ICD-10-CM | POA: Diagnosis not present

## 2016-05-07 DIAGNOSIS — F17210 Nicotine dependence, cigarettes, uncomplicated: Secondary | ICD-10-CM | POA: Diagnosis not present

## 2016-05-07 DIAGNOSIS — Z85038 Personal history of other malignant neoplasm of large intestine: Secondary | ICD-10-CM | POA: Diagnosis not present

## 2016-05-07 DIAGNOSIS — Z01818 Encounter for other preprocedural examination: Secondary | ICD-10-CM | POA: Diagnosis not present

## 2016-05-07 DIAGNOSIS — R339 Retention of urine, unspecified: Secondary | ICD-10-CM | POA: Diagnosis not present

## 2016-05-16 ENCOUNTER — Emergency Department
Admission: EM | Admit: 2016-05-16 | Discharge: 2016-05-16 | Disposition: A | Discharge status: Home / Self Care | Payer: Self-pay | Attending: Emergency Medicine | Admitting: Emergency Medicine

## 2016-05-16 DIAGNOSIS — Z85038 Personal history of other malignant neoplasm of large intestine: Secondary | ICD-10-CM | POA: Insufficient documentation

## 2016-05-16 DIAGNOSIS — F17210 Nicotine dependence, cigarettes, uncomplicated: Secondary | ICD-10-CM | POA: Insufficient documentation

## 2016-05-16 DIAGNOSIS — T83198A Other mechanical complication of other urinary devices and implants, initial encounter: Secondary | ICD-10-CM | POA: Insufficient documentation

## 2016-05-16 DIAGNOSIS — R339 Retention of urine, unspecified: Secondary | ICD-10-CM

## 2016-05-16 DIAGNOSIS — T83010A Breakdown (mechanical) of cystostomy catheter, initial encounter: Secondary | ICD-10-CM

## 2016-05-16 DIAGNOSIS — Z79899 Other long term (current) drug therapy: Secondary | ICD-10-CM | POA: Insufficient documentation

## 2016-05-16 DIAGNOSIS — I10 Essential (primary) hypertension: Secondary | ICD-10-CM | POA: Insufficient documentation

## 2016-05-16 DIAGNOSIS — Y733 Surgical instruments, materials and gastroenterology and urology devices (including sutures) associated with adverse incidents: Secondary | ICD-10-CM | POA: Insufficient documentation

## 2016-05-16 LAB — URINALYSIS, COMPLETE (UACMP) WITH MICROSCOPIC
BILIRUBIN URINE: NEGATIVE
Glucose, UA: NEGATIVE mg/dL
KETONES UR: NEGATIVE mg/dL
NITRITE: NEGATIVE
Protein, ur: NEGATIVE mg/dL
Specific Gravity, Urine: 1.006 (ref 1.005–1.030)
Squamous Epithelial / LPF: NONE SEEN
pH: 6 (ref 5.0–8.0)

## 2016-05-16 MED ORDER — HYDROMORPHONE HCL 1 MG/ML IJ SOLN
1.0000 mg | INTRAMUSCULAR | Status: AC
  Administered 2016-05-16: 1 mg via INTRAVENOUS

## 2016-05-16 MED ORDER — HYDROMORPHONE HCL 1 MG/ML IJ SOLN
INTRAMUSCULAR | Status: AC
  Administered 2016-05-16: 1 mg via INTRAVENOUS
  Filled 2016-05-16: qty 1

## 2016-05-16 NOTE — ED Notes (Signed)
Patient reports hx of colon and prostate cancer. Pt has suprapubic catheter that is normally replaced every 30 days. Pt reports his catheter was due to be changed on 05/05/16.

## 2016-05-16 NOTE — ED Notes (Signed)
RN assisted while MD Karma Greaser removed old suprapubic catheter, and inserted/placed new 16 Fr non-latex suprapubic catheter. 800 mL of cloudy urine returned. Catheter tubing attached to patient with securing device. Drainage bag placed below level of the bladder.

## 2016-05-16 NOTE — Discharge Instructions (Signed)
Urine severe pain because your suprapubic catheter was blocked.  We replaced it with a 16 French Foley catheter and you drained more than 1600 mL of urine in the emergency department.  Please continue changing a leg bag appropriately and follow up with your urologist at the next available opportunity.  There is no sign of urinary tract infection at this time and a urine culture is pending.  Please let your urologist know this when you call them to schedule your appointment.

## 2016-05-16 NOTE — ED Triage Notes (Addendum)
Patient reports hx of prostate cancer and suprapubic catheter. Pt reports his last void at 0100 today. Pt c/o pelvic pain. Pt believes his catheter is clogged.

## 2016-05-16 NOTE — ED Provider Notes (Signed)
[facility] Emergency Department Provider Note  ____________________________________________   First MD Initiated Contact with Patient 05/16/16 [id number]     (approximate)  I have reviewed the triage vital signs and the nursing notes.   HISTORY  Chief Complaint Urinary Retention    HPI Gerald Odonnell is a 61 y.o. male with a history of prostate cancer undergoing radiation treatment at [facility] who presents for evaluation of urinary retention and severe suprapubic pain.  He has had a suprapubic catheter for about 3 years and it is supposed to be changed every month but he thinks it is been quite a bit longer than that on the current catheter.  He drained his bag yesterday afternoon and has not had any output since that time.  He awoke from sleep with severe pain and came into the emergency Department immediately.  The pain is severe sharp, feels "full", and nothing makes it better or worse.  He denies chest pain, shortness of breath, nausea, vomiting, and any other abdominal pain.   Past Medical History:  Diagnosis Date  . Cancer ([place])    colon  . Hypertension   . S/P colon resection   . Shortness of breath dyspnea   . Urinary retention     Patient Active Problem List   Diagnosis Date Noted  . Sepsis ([place]) 09/10/2015  . Chest pain 09/10/2015  . Clostridium difficile diarrhea   . Colitis, infectious 04/23/2015  . Incomplete bladder emptying 09/10/2014  . Edema leg 09/15/2013  . Pelvic and perineal pain 09/15/2013  . Benign prostatic hyperplasia with urinary obstruction 07/22/2013    Past Surgical History:  Procedure Laterality Date  . ABDOMINAL SURGERY    . SUPRAPUBIC CATHETER INSERTION      Prior to Admission medications   Medication Sig Start Date End Date Taking? Authorizing Provider  acidophilus (RISAQUAD) CAPS capsule Take 2 capsules by mouth daily. Patient not taking: Reported on 06/21/2015 04/29/15   Epifanio Lesches, MD    diphenhydramine-acetaminophen (TYLENOL PM) 25-500 MG TABS tablet Take 1 tablet by mouth at bedtime as needed (for sleep/pain).    Historical Provider, MD  nitrofurantoin (MACRODANTIN) 100 MG capsule Take 1 capsule (100 mg total) by mouth 2 (two) times daily. 09/13/15   Epifanio Lesches, MD  oxybutynin (DITROPAN) 5 MG tablet Take 1 tablet (5 mg total) by mouth 3 (three) times daily. 09/13/15   Epifanio Lesches, MD  oxyCODONE-acetaminophen (PERCOCET/ROXICET) 5-325 MG tablet Take 2 tablets by mouth every 6 (six) hours as needed for moderate pain. Patient not taking: Reported on 06/21/2015 04/29/15   Epifanio Lesches, MD  tamsulosin (FLOMAX) 0.4 MG CAPS capsule Take 1 capsule (0.4 mg total) by mouth daily. Patient not taking: Reported on 09/10/2015 04/29/15   Epifanio Lesches, MD    Allergies Patient has no known allergies.  Family History  Problem Relation Age of Onset  . Hypertension Other     Social History Social History  Substance Use Topics  . Smoking status: Current Every Day Smoker    Packs/day: 0.50    Types: Cigarettes  . Smokeless tobacco: Never Used  . Alcohol use No    Review of Systems Constitutional: No fever/chills Eyes: No visual changes. ENT: No sore throat. Cardiovascular: Denies chest pain. Respiratory: Denies shortness of breath. Gastrointestinal: No abdominal pain.  No nausea, no vomiting.  No diarrhea.  No constipation. Genitourinary: Urinary retention and severe suprapubic pain at the site of his suprapubic catheter Musculoskeletal: Negative for back pain. Skin: Negative for  rash. Neurological: Negative for headaches, focal weakness or numbness.  10-point ROS otherwise negative.  ____________________________________________   PHYSICAL EXAM:  VITAL SIGNS: ED Triage Vitals [05/16/16 0409]  Enc Vitals Group     BP (!) 180/119     Pulse Rate (!) 140     Resp (!) 24     Temp 97.9 F (36.6 C)     Temp Source Oral     SpO2 95 %     Weight       Height      Head Circumference      Peak Flow      Pain Score      Pain Loc      Pain Edu?      Excl. in [place]?     Constitutional: Alert and oriented. Pacing the room, diaphoretic, severe pain Eyes: Conjunctivae are normal. PERRL. EOMI. Head: Atraumatic. Nose: No congestion/rhinnorhea. Mouth/Throat: Mucous membranes are moist.  Oropharynx non-erythematous. Neck: No stridor.  No meningeal signs.   Cardiovascular: Tachycardia, regular rhythm. Good peripheral circulation. Grossly normal heart sounds. Respiratory: Normal respiratory effort.  No retractions. Lungs CTAB. Gastrointestinal: Soft and nontender. No distention.  Genitourinary: Normal external genitalia, circumcised penis.  Suprapubic catheter is present and easily inserts farther into the bladder with no resistance.  There is no output in his urine leg bag. Musculoskeletal: No lower extremity tenderness nor edema. No gross deformities of extremities. Neurologic:  Normal speech and language. No gross focal neurologic deficits are appreciated.  Skin:  Skin is warm, dry and intact. No rash noted. Psychiatric: Mood and affect are very anxious and upset ____________________________________________   LABS (all labs ordered are listed, but only abnormal results are displayed)  Labs Reviewed  URINALYSIS, COMPLETE (UACMP) WITH MICROSCOPIC - Abnormal; Notable for the following:       Result Value   Color, Urine YELLOW (*)    APPearance CLEAR (*)    Hgb urine dipstick LARGE (*)    Leukocytes, UA TRACE (*)    Bacteria, UA RARE (*)    All other components within normal limits  URINE CULTURE   ____________________________________________  EKG  None - EKG not ordered by ED physician ____________________________________________  RADIOLOGY   No results found.  ____________________________________________   PROCEDURES  Procedure(s) performed:   Procedures   Critical Care performed:  No ____________________________________________   INITIAL IMPRESSION / ASSESSMENT AND PLAN / ED COURSE  Pertinent labs & imaging results that were available during my care of the patient were reviewed by me and considered in my medical decision making (see chart for details).  The patient was in severe pain upon arrival and I assessed him immediately after he came to the exam room.  Given no output in the probability of a clogged and the Foley catheter, his nurse got together a Foley kit while I did a quick ultrasound of his bladder which showed very large bladder distention.  I deflated the balloon on the suprapubic catheter and attempted to remove the catheter, but it would not come out and the patient was screaming in pain.  We then attempted to place a 16 [place] coud catheter through the urethra but it did not return any urine in spite of what seems to be good placement and no resistance as I expected we would experience through the prostate.  Patient had already received 1 mg of Dilaudid by IV and he was screaming in pain, tachycardic, hypertensive, and tachypneic, and was in worse pain after our attempts  so I gave him another 1 mg of Dilaudid.  I called Dr. Jeffie Pollock the urologist for assistance and he recommended over inflating the Foley balloon to 30 mL an attempt to dislodge the calcifications that were likely preventing easy removal.  He was on his way in to [facility] but it would take 30 minutes for his arrival.  I over inflated the balloon as he recommended without any complications.  The suprapubic catheter then was removed with no difficulty and urine immediately began pouring from the stoma.  I quickly inserted a new 15 French Foley catheter through the suprapubic stoma using sterile procedure as much as possible given that the patient was actively outputting urine at the time.  Pink tinged urine immediately began flowing into the urine bag.  He reported immediately  starting to feel better.  I called Dr. Jeffie Pollock to let him know and he is not coming at this time.  I will send off a urine for urinalysis and culture and we will continue monitor the patient for how much output he produces and to make sure his vital signs stabilized and that he has symptomatic relief after complete drainage of the bladder.   Clinical Course as of May 16 730  Sat May 16, 2016  6812 Patient's pain has resolved.  Urine continues to drain into Foley bag, he has currently output about 1250 mL.  [CF]  0729 No indication to treat with antibiotics.  Will await urine culture.  Discharging for [facility] follow up.  I gave my usual and customary return precautions.  Patient has now had more than 1600 mL of urine output.  [CF]    Clinical Course User Index [CF] Hinda Kehr, MD    ____________________________________________  FINAL CLINICAL IMPRESSION(S) / ED DIAGNOSES  Final diagnoses:  Urinary retention  Suprapubic catheter dysfunction, initial encounter [facility])     MEDICATIONS GIVEN DURING THIS VISIT:  Medications  HYDROmorphone (DILAUDID) injection 1 mg (1 mg Intravenous Given 05/16/16 0420)  HYDROmorphone (DILAUDID) injection 1 mg (1 mg Intravenous Given 05/16/16 0444)     NEW OUTPATIENT MEDICATIONS STARTED DURING THIS VISIT:  New Prescriptions   No medications on file    Modified Medications   No medications on file    Discontinued Medications   No medications on file     Note:  This document was prepared using Dragon voice recognition software and may include unintentional dictation errors.    Hinda Kehr, MD 05/16/16 [phone]

## 2016-05-17 LAB — URINE CULTURE: Special Requests: NORMAL

## 2016-05-19 DIAGNOSIS — R0601 Orthopnea: Secondary | ICD-10-CM | POA: Diagnosis not present

## 2016-05-19 DIAGNOSIS — Z87898 Personal history of other specified conditions: Secondary | ICD-10-CM | POA: Diagnosis not present

## 2016-05-19 DIAGNOSIS — I352 Nonrheumatic aortic (valve) stenosis with insufficiency: Secondary | ICD-10-CM | POA: Diagnosis not present

## 2016-05-25 DIAGNOSIS — Z800 Family history of malignant neoplasm of digestive organs: Secondary | ICD-10-CM | POA: Diagnosis not present

## 2016-05-25 DIAGNOSIS — C61 Malignant neoplasm of prostate: Secondary | ICD-10-CM | POA: Diagnosis not present

## 2016-05-25 DIAGNOSIS — Z01818 Encounter for other preprocedural examination: Secondary | ICD-10-CM | POA: Diagnosis not present

## 2016-05-25 DIAGNOSIS — Z87898 Personal history of other specified conditions: Secondary | ICD-10-CM | POA: Diagnosis not present

## 2016-05-25 DIAGNOSIS — Z85038 Personal history of other malignant neoplasm of large intestine: Secondary | ICD-10-CM | POA: Diagnosis not present

## 2016-05-25 DIAGNOSIS — R339 Retention of urine, unspecified: Secondary | ICD-10-CM | POA: Diagnosis not present

## 2016-05-25 DIAGNOSIS — F17210 Nicotine dependence, cigarettes, uncomplicated: Secondary | ICD-10-CM | POA: Diagnosis not present

## 2016-05-25 DIAGNOSIS — N3289 Other specified disorders of bladder: Secondary | ICD-10-CM | POA: Diagnosis not present

## 2016-05-25 HISTORY — PX: PROSTATE SURGERY: SHX751

## 2016-05-27 ENCOUNTER — Emergency Department
Admission: EM | Admit: 2016-05-27 | Discharge: 2016-05-27 | Disposition: A | Discharge status: Home / Self Care | Payer: Self-pay | Attending: Emergency Medicine | Admitting: Emergency Medicine

## 2016-05-27 ENCOUNTER — Encounter: Payer: Self-pay | Admitting: Emergency Medicine

## 2016-05-27 DIAGNOSIS — Z79899 Other long term (current) drug therapy: Secondary | ICD-10-CM | POA: Insufficient documentation

## 2016-05-27 DIAGNOSIS — T83098A Other mechanical complication of other indwelling urethral catheter, initial encounter: Secondary | ICD-10-CM | POA: Insufficient documentation

## 2016-05-27 DIAGNOSIS — F17210 Nicotine dependence, cigarettes, uncomplicated: Secondary | ICD-10-CM | POA: Insufficient documentation

## 2016-05-27 DIAGNOSIS — I10 Essential (primary) hypertension: Secondary | ICD-10-CM | POA: Insufficient documentation

## 2016-05-27 DIAGNOSIS — Z85038 Personal history of other malignant neoplasm of large intestine: Secondary | ICD-10-CM | POA: Insufficient documentation

## 2016-05-27 DIAGNOSIS — T83010D Breakdown (mechanical) of cystostomy catheter, subsequent encounter: Secondary | ICD-10-CM

## 2016-05-27 DIAGNOSIS — Y829 Unspecified medical devices associated with adverse incidents: Secondary | ICD-10-CM | POA: Insufficient documentation

## 2016-05-27 MED ORDER — MORPHINE SULFATE (PF) 2 MG/ML IV SOLN: 2.0000 mg | Freq: Once | INTRAVENOUS | Status: DC

## 2016-05-27 MED ORDER — MORPHINE SULFATE (PF) 4 MG/ML IV SOLN
4.0000 mg | Freq: Once | INTRAVENOUS | Status: AC
  Administered 2016-05-27: 4 mg via INTRAMUSCULAR

## 2016-05-27 MED ORDER — MORPHINE SULFATE (PF) 4 MG/ML IV SOLN
INTRAVENOUS | Status: AC
  Administered 2016-05-27: 4 mg via INTRAMUSCULAR
  Filled 2016-05-27: qty 1

## 2016-05-27 NOTE — ED Triage Notes (Signed)
Pt states had prostate surgery Monday at [facility].  Pt states not producing urine since after lunch with pain "inside".

## 2016-05-27 NOTE — Discharge Instructions (Signed)
Follow-up with your urologist and oncologist as planned. Return to the ED as needed.

## 2016-05-27 NOTE — ED Notes (Signed)
Pt. Verbalizes understanding of d/c instructions and follow-up. VS stable and pain controlled per pt.  Pt. In NAD at time of d/c and denies further concerns regarding this visit. Pt. Stable at the time of departure from the unit, departing unit by the safest and most appropriate manner per that pt condition and limitations. Pt advised to return to the ED at any time for emergent concerns, or for new/worsening symptoms.   

## 2016-05-27 NOTE — ED Notes (Addendum)
Old suprapubic catheter removed and new 16Fr inserted, large urine output noted, pt reports relief.  When old catheter removed, urine gushing from stoma

## 2016-05-28 NOTE — ED Provider Notes (Signed)
[facility] Emergency Department Provider Note ____________________________________________  Time seen: 2122  I have reviewed the triage vital signs and the nursing notes.  HISTORY  Chief Complaint  Post-op Problem and Urinary Retention  HPI Gerald Odonnell is a 61 y.o. male presents to the ED for evaluation dysfunction of his suprapubic catheter. Patient had surgery on Monday [facility] secondary to prostate cancer. He had seeds implanted into the prostate for chemotherapy.He reports urinary retention and poor out put into his urinary catheter bag. He reports a similar episode 2 weeks prior, and was seen here and had his catheter replaced by the ED provider.  Past Medical History:  Diagnosis Date  . Cancer ([place])    colon  . Hypertension   . S/P colon resection   . Shortness of breath dyspnea   . Urinary retention     Patient Active Problem List   Diagnosis Date Noted  . Sepsis ([place]) 09/10/2015  . Chest pain 09/10/2015  . Clostridium difficile diarrhea   . Colitis, infectious 04/23/2015  . Incomplete bladder emptying 09/10/2014  . Edema leg 09/15/2013  . Pelvic and perineal pain 09/15/2013  . Benign prostatic hyperplasia with urinary obstruction 07/22/2013    Past Surgical History:  Procedure Laterality Date  . ABDOMINAL SURGERY    . PROSTATE SURGERY  05/25/2016  . SUPRAPUBIC CATHETER INSERTION      Prior to Admission medications   Medication Sig Start Date End Date Taking? Authorizing Provider  acidophilus (RISAQUAD) CAPS capsule Take 2 capsules by mouth daily. Patient not taking: Reported on 06/21/2015 04/29/15   Epifanio Lesches, MD  diphenhydramine-acetaminophen (TYLENOL PM) 25-500 MG TABS tablet Take 1 tablet by mouth at bedtime as needed (for sleep/pain).    Historical Provider, MD  nitrofurantoin (MACRODANTIN) 100 MG capsule Take 1 capsule (100 mg total) by mouth 2 (two) times daily. 09/13/15   Epifanio Lesches, MD  oxybutynin (DITROPAN) 5 MG  tablet Take 1 tablet (5 mg total) by mouth 3 (three) times daily. 09/13/15   Epifanio Lesches, MD  oxyCODONE-acetaminophen (PERCOCET/ROXICET) 5-325 MG tablet Take 2 tablets by mouth every 6 (six) hours as needed for moderate pain. Patient not taking: Reported on 06/21/2015 04/29/15   Epifanio Lesches, MD  tamsulosin (FLOMAX) 0.4 MG CAPS capsule Take 1 capsule (0.4 mg total) by mouth daily. Patient not taking: Reported on 09/10/2015 04/29/15   Epifanio Lesches, MD    Allergies Patient has no known allergies.  Family History  Problem Relation Age of Onset  . Hypertension Other     Social History Social History  Substance Use Topics  . Smoking status: Current Every Day Smoker    Packs/day: 0.50    Types: Cigarettes  . Smokeless tobacco: Never Used  . Alcohol use No    Review of Systems  Constitutional: Negative for fever. Cardiovascular: Negative for chest pain. Respiratory: Negative for shortness of breath. Gastrointestinal: Negative for abdominal pain, vomiting and diarrhea. Genitourinary: Negative for dysuria. Reports urinary retention and suprapubic cath dysfunction.  Musculoskeletal: Negative for back pain. ____________________________________________  PHYSICAL EXAM:  VITAL SIGNS: ED Triage Vitals  Enc Vitals Group     BP 05/27/16 2030 (!) 178/114     Pulse Rate 05/27/16 2030 96     Resp 05/27/16 2030 19     Temp 05/27/16 2030 97.8 F (36.6 C)     Temp Source 05/27/16 2030 Oral     SpO2 05/27/16 2030 100 %     Weight 05/27/16 2030 228 lb (103.4 kg)  Height 05/27/16 2030 5\' 10"  (1.778 m)     Head Circumference --      Peak Flow --      Pain Score 05/27/16 2041 10     Pain Loc --      Pain Edu? --      Excl. in [place]? --    Constitutional: Alert and oriented. Well appearing and in no distress. Head: Normocephalic and atraumatic. Cardiovascular: Normal rate, regular rhythm. Normal distal pulses. Respiratory: Normal respiratory effort. No  wheezes/rales/rhonchi. Gastrointestinal: Soft and nontender. No distention. Musculoskeletal: Nontender with normal range of motion in all extremities.  ____________________________________________   LABS (pertinent positives/negatives) Labs Reviewed - No data to display ____________________________________________  PROCEDURES  Morphine 4 mg IM Removal of old suprapubic cath and a new 16 Fr cath inserted by the RN Large amount, dark urine from stoma.  ____________________________________________  INITIAL IMPRESSION / ASSESSMENT AND PLAN / ED COURSE  Patient with replacement of his suprapubic catheter without difficulty, by the nurse. He has normal urine flow into the catheter bag. Patient reports reduction of his pain and pressure to the suprapubic region. He is discharged to follow-up with his urologist and oncologist as scheduled. Return to the ED as needed.  ____________________________________________  FINAL CLINICAL IMPRESSION(S) / ED DIAGNOSES  Final diagnoses:  Suprapubic catheter dysfunction, subsequent encounter      Melvenia Needles, PA-C 05/31/16 1907    Orbie Pyo, MD 06/01/16 [phone]

## 2016-06-06 ENCOUNTER — Emergency Department
Admission: EM | Admit: 2016-06-06 | Discharge: 2016-06-06 | Disposition: A | Discharge status: Home / Self Care | Payer: Commercial Managed Care - HMO | Attending: Emergency Medicine | Admitting: Emergency Medicine

## 2016-06-06 ENCOUNTER — Encounter: Payer: Self-pay | Admitting: Emergency Medicine

## 2016-06-06 DIAGNOSIS — T83098A Other mechanical complication of other indwelling urethral catheter, initial encounter: Secondary | ICD-10-CM | POA: Diagnosis not present

## 2016-06-06 DIAGNOSIS — R339 Retention of urine, unspecified: Secondary | ICD-10-CM | POA: Diagnosis not present

## 2016-06-06 DIAGNOSIS — T83091A Other mechanical complication of indwelling urethral catheter, initial encounter: Secondary | ICD-10-CM | POA: Diagnosis not present

## 2016-06-06 DIAGNOSIS — I10 Essential (primary) hypertension: Secondary | ICD-10-CM | POA: Insufficient documentation

## 2016-06-06 DIAGNOSIS — Z79899 Other long term (current) drug therapy: Secondary | ICD-10-CM | POA: Insufficient documentation

## 2016-06-06 DIAGNOSIS — F17210 Nicotine dependence, cigarettes, uncomplicated: Secondary | ICD-10-CM | POA: Diagnosis not present

## 2016-06-06 DIAGNOSIS — Z85038 Personal history of other malignant neoplasm of large intestine: Secondary | ICD-10-CM | POA: Diagnosis not present

## 2016-06-06 DIAGNOSIS — Y69 Unspecified misadventure during surgical and medical care: Secondary | ICD-10-CM | POA: Diagnosis not present

## 2016-06-06 DIAGNOSIS — R338 Other retention of urine: Secondary | ICD-10-CM

## 2016-06-06 DIAGNOSIS — T83010A Breakdown (mechanical) of cystostomy catheter, initial encounter: Secondary | ICD-10-CM

## 2016-06-06 NOTE — ED Provider Notes (Signed)
[facility] Emergency Department Provider Note  ____________________________________________   First MD Initiated Contact with Patient 06/06/16 1247     (approximate)  I have reviewed the triage vital signs and the nursing notes.   HISTORY  Chief Complaint Urinary Retention    HPI Gerald Odonnell is a 61 y.o. male with a history of prostate cancer who is followed by [facility] urology and who has had a suprapubic catheter for years.  He has also recently had the bladder "seeds" implanted.  He has had 2 prior episodes ofurinary retention secondary to suprapubic catheter dysfunction; today's episode is the third episode in the last 3 weeks.  I saw him on his initial visit on January 20 and he was in severe pain and had more than 3 L of output after we are able to replace the suprapubic catheter.  Came in again about 11 days ago with retention and they swapped out the suprapubic catheter again.  He presents today after stating that he feels like some of the seeds were coming out and clogging the suprapubic catheter.  He was not getting any output in the bag and was developing severe suprapubic pain similar prior so he wanted come in and get it fixed before it became more of an issue.  He was in moderate to severe distress upon arrival and because I was familiar with the patient and nursing to intervene.  Please see hospital course for more details.  He denies fever/chills, chest pain, shortness of breath, nausea, vomiting, diarrhea.  He has not seen any blood in his urine.  He has been taking Azo for the urinary discomfort.   Past Medical History:  Diagnosis Date  . Cancer ([place])    colon  . Hypertension   . S/P colon resection   . Shortness of breath dyspnea   . Urinary retention     Patient Active Problem List   Diagnosis Date Noted  . Sepsis ([place]) 09/10/2015  . Chest pain 09/10/2015  . Clostridium difficile diarrhea   . Colitis, infectious 04/23/2015  .  Incomplete bladder emptying 09/10/2014  . Edema leg 09/15/2013  . Pelvic and perineal pain 09/15/2013  . Benign prostatic hyperplasia with urinary obstruction 07/22/2013    Past Surgical History:  Procedure Laterality Date  . ABDOMINAL SURGERY    . PROSTATE SURGERY  05/25/2016  . SUPRAPUBIC CATHETER INSERTION      Prior to Admission medications   Medication Sig Start Date End Date Taking? Authorizing Provider  diphenhydramine-acetaminophen (TYLENOL PM) 25-500 MG TABS tablet Take 1 tablet by mouth at bedtime as needed (for sleep/pain).   Yes Historical Provider, MD  tamsulosin (FLOMAX) 0.4 MG CAPS capsule Take 1 capsule (0.4 mg total) by mouth daily. 04/29/15  Yes Epifanio Lesches, MD  acidophilus (RISAQUAD) CAPS capsule Take 2 capsules by mouth daily. Patient not taking: Reported on 06/21/2015 04/29/15   Epifanio Lesches, MD  oxybutynin (DITROPAN) 5 MG tablet Take 1 tablet (5 mg total) by mouth 3 (three) times daily. Patient not taking: Reported on 06/06/2016 09/13/15   Epifanio Lesches, MD  oxyCODONE-acetaminophen (PERCOCET/ROXICET) 5-325 MG tablet Take 2 tablets by mouth every 6 (six) hours as needed for moderate pain. Patient not taking: Reported on 06/21/2015 04/29/15   Epifanio Lesches, MD    Allergies Patient has no known allergies.  Family History  Problem Relation Age of Onset  . Hypertension Other     Social History Social History  Substance Use Topics  . Smoking status:  Current Every Day Smoker    Packs/day: 0.50    Types: Cigarettes  . Smokeless tobacco: Never Used  . Alcohol use No    Review of Systems Constitutional: No fever/chills Eyes: No visual changes. ENT: No sore throat. Cardiovascular: Denies chest pain. Respiratory: Denies shortness of breath. Gastrointestinal: No abdominal pain.  No nausea, no vomiting.  No diarrhea.  No constipation. Genitourinary: Dysfunctional suprapubic catheter Musculoskeletal: Negative for back pain. Skin: Negative  for rash. Neurological: Negative for headaches, focal weakness or numbness.  10-point ROS otherwise negative.  ____________________________________________   PHYSICAL EXAM:  VITAL SIGNS: ED Triage Vitals [06/06/16 1157]  Enc Vitals Group     BP (!) 181/111     Pulse Rate (!) 129     Resp 20     Temp 98 F (36.7 C)     Temp Source Oral     SpO2 100 %     Weight 228 lb (103.4 kg)     Height      Head Circumference      Peak Flow      Pain Score 10     Pain Loc      Pain Edu?      Excl. in [place]?     Constitutional: Alert and oriented. Well appearing and in no acute distress. Eyes: Conjunctivae are normal. PERRL. EOMI. Head: Atraumatic. Nose: No congestion/rhinnorhea. Mouth/Throat: Mucous membranes are moist. Neck: No stridor.  No meningeal signs.   Cardiovascular: Normal rate, regular rhythm. Good peripheral circulation. Grossly normal heart sounds. Respiratory: Normal respiratory effort.  No retractions. Lungs CTAB. Gastrointestinal: Soft and nontender. No distention.  Genitourinary: RN replaced suprapubic catheter with a 16-Fr Foley.  Good yellow urine return, >700 mL.   Musculoskeletal: No lower extremity tenderness nor edema. No gross deformities of extremities. Neurologic:  Normal speech and language. No gross focal neurologic deficits are appreciated.  Skin:  Skin is warm, dry and intact. No rash noted. Psychiatric: Mood and affect are normal. Speech and behavior are normal.  ____________________________________________   LABS (all labs ordered are listed, but only abnormal results are displayed)  Labs Reviewed - No data to display ____________________________________________  EKG  None - EKG not ordered by ED physician ____________________________________________  RADIOLOGY   No results found.  ____________________________________________   PROCEDURES  Procedure(s) performed:   Procedures   Critical Care performed:  No ____________________________________________   INITIAL IMPRESSION / ASSESSMENT AND PLAN / ED COURSE  Pertinent labs & imaging results that were available during my care of the patient were reviewed by me and considered in my medical decision making (see chart for details).     Clinical Course as of Jun 06 1445  Sat Jun 06, 2016  1237 This is the third time recently the patient has presented with non-functioning suprapubic catheter.  I asked the nurse to swap out the catheter with a 16 Fr Foley because I will be delayed before I can see the patient. She will let me know if there are any issues, but the patient has had a suprapubic for years and track is well developed.  [CF]  [id number] The patient is calm and in no distress with mild discomfort to abdominal palpation around the suprapubic site but it is minimal and he feels much better.  He has had greater than 700 mL of output.  I checked and his urine culture on January 20 did not grow any specific organisms and there is no indication for an additional urinalysis or urine  culture today.  I encouraged him to follow up with his urologist at the next available opportunity which should be Monday morning (less than 2 days from now).  I gave my usual and customary return precautions.  Patient states he is ready to go home.  [CF]    Clinical Course User Index [CF] Hinda Kehr, MD    ____________________________________________  FINAL CLINICAL IMPRESSION(S) / ED DIAGNOSES  Final diagnoses:  Suprapubic catheter dysfunction, initial encounter ([place])  Acute urinary retention     MEDICATIONS GIVEN DURING THIS VISIT:  Medications - No data to display   NEW OUTPATIENT MEDICATIONS STARTED DURING THIS VISIT:  Discharge Medication List as of 06/06/2016  2:02 PM      Discharge Medication List as of 06/06/2016  2:02 PM      Discharge Medication List as of 06/06/2016  2:02 PM       Note:  This document was prepared using Dragon voice  recognition software and may include unintentional dictation errors.    Hinda Kehr, MD 06/06/16 1447

## 2016-06-06 NOTE — ED Notes (Signed)
Pt. Verbalizes understanding of d/c instructions, prescriptions, and follow-up. VS stable.Pt. In NAD at time of d/c and denies further concerns regarding this visit. Pt. Stable at the time of departure from the unit, departing unit by the safest and most appropriate manner per that pt condition and limitations. Pt advised to return to the ED at any time for emergent concerns, or for new/worsening symptoms.   

## 2016-06-06 NOTE — ED Triage Notes (Signed)
Pt to ed with c/o bladder pain.  States "my tube is blocked up and needs to be changed"  Pt reports no output for the last hour.

## 2016-06-06 NOTE — ED Notes (Signed)
Verbal order from MD forbach to change suprapubic catheter with 16 french. Mickel Baas, EDT to assist.

## 2016-06-06 NOTE — Discharge Instructions (Signed)
Today we replaced your suprapubic catheter, the third time in about 3 weeks.  Please follow up on Monday morning for the next available appointment with your urologist in [place].  Return to the emergency department if you develop new or worsening symptoms that concern you.

## 2016-06-10 DIAGNOSIS — C61 Malignant neoplasm of prostate: Secondary | ICD-10-CM | POA: Diagnosis not present

## 2016-06-10 DIAGNOSIS — I10 Essential (primary) hypertension: Secondary | ICD-10-CM | POA: Diagnosis not present

## 2016-06-10 DIAGNOSIS — F17210 Nicotine dependence, cigarettes, uncomplicated: Secondary | ICD-10-CM | POA: Diagnosis not present

## 2016-06-10 DIAGNOSIS — Z0001 Encounter for general adult medical examination with abnormal findings: Secondary | ICD-10-CM | POA: Diagnosis not present

## 2016-06-10 DIAGNOSIS — Z1211 Encounter for screening for malignant neoplasm of colon: Secondary | ICD-10-CM | POA: Diagnosis not present

## 2016-06-10 DIAGNOSIS — N390 Urinary tract infection, site not specified: Secondary | ICD-10-CM | POA: Diagnosis not present

## 2016-06-14 ENCOUNTER — Encounter: Payer: Self-pay | Admitting: Emergency Medicine

## 2016-06-14 ENCOUNTER — Emergency Department
Admission: EM | Admit: 2016-06-14 | Discharge: 2016-06-14 | Disposition: A | Discharge status: Home / Self Care | Payer: Medicare HMO | Attending: Student in an Organized Health Care Education/Training Program | Admitting: Student in an Organized Health Care Education/Training Program

## 2016-06-14 DIAGNOSIS — T83098A Other mechanical complication of other indwelling urethral catheter, initial encounter: Secondary | ICD-10-CM | POA: Diagnosis not present

## 2016-06-14 DIAGNOSIS — N3090 Cystitis, unspecified without hematuria: Secondary | ICD-10-CM | POA: Insufficient documentation

## 2016-06-14 DIAGNOSIS — Z85038 Personal history of other malignant neoplasm of large intestine: Secondary | ICD-10-CM | POA: Insufficient documentation

## 2016-06-14 DIAGNOSIS — F17210 Nicotine dependence, cigarettes, uncomplicated: Secondary | ICD-10-CM | POA: Insufficient documentation

## 2016-06-14 DIAGNOSIS — T83091A Other mechanical complication of indwelling urethral catheter, initial encounter: Secondary | ICD-10-CM | POA: Insufficient documentation

## 2016-06-14 DIAGNOSIS — R1030 Lower abdominal pain, unspecified: Secondary | ICD-10-CM | POA: Diagnosis not present

## 2016-06-14 DIAGNOSIS — I10 Essential (primary) hypertension: Secondary | ICD-10-CM | POA: Insufficient documentation

## 2016-06-14 DIAGNOSIS — T83090A Other mechanical complication of cystostomy catheter, initial encounter: Secondary | ICD-10-CM

## 2016-06-14 DIAGNOSIS — Y828 Other medical devices associated with adverse incidents: Secondary | ICD-10-CM | POA: Insufficient documentation

## 2016-06-14 DIAGNOSIS — Z79899 Other long term (current) drug therapy: Secondary | ICD-10-CM | POA: Insufficient documentation

## 2016-06-14 DIAGNOSIS — R102 Pelvic and perineal pain: Secondary | ICD-10-CM

## 2016-06-14 LAB — URINALYSIS, COMPLETE (UACMP) WITH MICROSCOPIC
BILIRUBIN URINE: NEGATIVE
Bacteria, UA: NONE SEEN
Glucose, UA: NEGATIVE mg/dL
KETONES UR: NEGATIVE mg/dL
LEUKOCYTES UA: NEGATIVE
NITRITE: POSITIVE — AB
PH: 6 (ref 5.0–8.0)
PROTEIN: NEGATIVE mg/dL
Specific Gravity, Urine: 1.012 (ref 1.005–1.030)
Squamous Epithelial / LPF: NONE SEEN

## 2016-06-14 MED ORDER — CEPHALEXIN 500 MG PO CAPS
500.0000 mg | ORAL_CAPSULE | Freq: Once | ORAL | Status: AC
  Administered 2016-06-14: 500 mg via ORAL
  Filled 2016-06-14: qty 1

## 2016-06-14 MED ORDER — CEPHALEXIN 500 MG PO CAPS: 500.0000 mg | ORAL_CAPSULE | Freq: Four times a day (QID) | ORAL | 0 refills | Status: AC

## 2016-06-14 NOTE — Discharge Instructions (Signed)
Please call your Urology doctor, Dr. Case Clydene Laming, at the above listed location to schedule a follow up appointment to discuss the frequent blockages in your catheter.

## 2016-06-14 NOTE — ED Provider Notes (Signed)
[facility] Emergency Department Provider Note    First MD Initiated Contact with Patient 06/14/16 1736     (approximate)  I have reviewed the triage vital signs and the nursing notes.   HISTORY  Chief Complaint catheter issues    HPI Gerald Odonnell is a 61 y.o. male with a history of prostate cancer status post radiation on their P and placement of suprapubic catheter several years ago presents with acute suprapubic pain and Foley obstruction. This is not the fourth of episode of same. Denies any fevers. States his symptoms began worsening over this morning. He tried moving the Foley. He's been taking Azo as distracted. States that he did not have any urine output since this early this morning so he came to the ER for further evaluation.  He is pacing back and forth in the room currently rated the pain as 10 out of 10 in severity.   Past Medical History:  Diagnosis Date  . Cancer ([place])    colon  . Hypertension   . S/P colon resection   . Shortness of breath dyspnea   . Urinary retention    Family History  Problem Relation Age of Onset  . Hypertension Other    Past Surgical History:  Procedure Laterality Date  . ABDOMINAL SURGERY    . PROSTATE SURGERY  05/25/2016  . SUPRAPUBIC CATHETER INSERTION     Patient Active Problem List   Diagnosis Date Noted  . Sepsis ([place]) 09/10/2015  . Chest pain 09/10/2015  . Clostridium difficile diarrhea   . Colitis, infectious 04/23/2015  . Incomplete bladder emptying 09/10/2014  . Edema leg 09/15/2013  . Pelvic and perineal pain 09/15/2013  . 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 Authorizing Provider  acidophilus (RISAQUAD) CAPS capsule Take 2 capsules by mouth daily. Patient not taking: Reported on 06/21/2015 04/29/15   Epifanio Lesches, MD  diphenhydramine-acetaminophen (TYLENOL PM) 25-500 MG  TABS tablet Take 1 tablet by mouth at bedtime as needed (for sleep/pain).    Historical Provider, MD  oxybutynin (DITROPAN) 5 MG tablet Take 1 tablet (5 mg total) by mouth 3 (three) times daily. Patient not taking: Reported on 06/06/2016 09/13/15   Epifanio Lesches, MD  oxyCODONE-acetaminophen (PERCOCET/ROXICET) 5-325 MG tablet Take 2 tablets by mouth every 6 (six) hours as needed for moderate pain. Patient not taking: Reported on 06/21/2015 04/29/15   Epifanio Lesches, MD  tamsulosin (FLOMAX) 0.4 MG CAPS capsule Take 1 capsule (0.4 mg total) by mouth daily. 04/29/15   Epifanio Lesches, MD    Allergies Patient has no known allergies.    Social History Social History  Substance Use Topics  . Smoking status: Current Every Day Smoker    Packs/day: 0.50    Types: Cigarettes  . Smokeless tobacco: Never Used  . Alcohol use No    Review of Systems Patient denies headaches, rhinorrhea, blurry vision, numbness, shortness of breath, chest pain, edema, cough, abdominal pain, nausea, vomiting, diarrhea, dysuria, fevers, rashes or hallucinations unless otherwise stated above in HPI. ____________________________________________   PHYSICAL EXAM:  VITAL SIGNS: Vitals:   06/14/16 1643  BP: (!) 187/107  Pulse: (!) 114  Resp: 20  Temp: 98.3 F (36.8 C)    Constitutional: Alert and oriented. Uncomfortable appearing pacing about room Eyes: Conjunctivae are normal. PERRL. EOMI. Head: Atraumatic. Nose: No congestion/rhinnorhea. Mouth/Throat: Mucous membranes are moist.  Oropharynx non-erythematous. Neck:  No stridor. Painless ROM. No cervical spine tenderness to palpation Hematological/Lymphatic/Immunilogical: No cervical lymphadenopathy. Cardiovascular: Normal rate, regular rhythm. Grossly normal heart sounds.  Good peripheral circulation. Respiratory: Normal respiratory effort.  No retractions. Lungs CTAB. Gu: suprapubic catheter in place, minimal drainage through bag.  +++suprapubic  ttp Gastrointestinal: Soft and nontender. No distention. No abdominal bruits. No CVA tenderness. Musculoskeletal: No lower extremity tenderness nor edema.  No joint effusions. Neurologic:  Normal speech and language. No gross focal neurologic deficits are appreciated. No gait instability. Skin:  Skin is warm, dry and intact. No rash noted. Psychiatric: Mood and affect are normal. Speech and behavior are normal.  ____________________________________________   LABS (all labs ordered are listed, but only abnormal results are displayed)  No results found for this or any previous visit (from the past 24 hour(s)). ____________________________________________ ____________________________________________  [id number]   ____________________________________________   PROCEDURES  Procedure(s) performed:  SUPRAPUBIC TUBE PLACEMENT Date/Time: 06/14/2016 6:37 PM Performed by: Merlyn Lot Authorized by: Merlyn Lot   Consent:    Consent obtained:  Verbal   Consent given by:  Patient Anesthesia (see MAR for exact dosages):    Anesthesia method:  None Procedure details:    Complexity:  Simple   Catheter type:  Foley   Catheter size:  16 Fr   Number of attempts:  1   Urine characteristics:  Blood-tinged Post-procedure details:    Patient tolerance of procedure:  Tolerated well, no immediate complications      Critical Care performed: no ____________________________________________   INITIAL IMPRESSION / ASSESSMENT AND PLAN / ED COURSE  Pertinent labs & imaging results that were available during my care of the patient were reviewed by me and considered in my medical decision making (see chart for details).  DDX: Catheter dysfunction, obstruction, UTI  Gerald Odonnell is a 61 y.o. who presents to the ED with obstructive catheter. This recurrent episode of similar issue. Catheter was exchanged under sterile precautions.  Patient was significant improvement in symptoms.  We'll send urinalysis to evaluate for any evidence of infection. Patient will need close follow-up with urology.    ----------------------------------------- 6:38 PM on 06/14/2016 -----------------------------------------  Patient feels much better at this time. Urinalysis does show nitrite positive and few bacteria. Nitrate positive likely in the setting of a so however given his frequent clogging with rare bacteria and few white blood cells will order urine culture and start on antibiotics as this may be there is source of the recurrent clogged Foley.  ____________________________________________   FINAL CLINICAL IMPRESSION(S) / ED DIAGNOSES  Final diagnoses:  Blocked suprapubic catheter, initial encounter ([place])  Suprapubic abdominal pain  Cystitis      NEW MEDICATIONS STARTED DURING THIS VISIT:  New Prescriptions   No medications on file     Note:  This document was prepared using Dragon voice recognition software and may include unintentional dictation errors.    Merlyn Lot, MD 06/14/16 Bosie Helper

## 2016-06-14 NOTE — ED Triage Notes (Signed)
Pt states that he has a suprapubic catheter, patient states that his catheter is blocked. Pt is having pain, unable to sit.

## 2016-06-14 NOTE — ED Notes (Signed)
Pt presents with suprapubic catheter blocked x 3 hours. Pt states this is a regular occurrence. He reports taking Azo for pain. Pt alert & oriented with NAD noted

## 2016-06-29 DIAGNOSIS — C61 Malignant neoplasm of prostate: Secondary | ICD-10-CM | POA: Diagnosis not present

## 2016-06-29 DIAGNOSIS — N3289 Other specified disorders of bladder: Secondary | ICD-10-CM | POA: Diagnosis not present

## 2016-06-29 DIAGNOSIS — Z85038 Personal history of other malignant neoplasm of large intestine: Secondary | ICD-10-CM | POA: Diagnosis not present

## 2016-06-29 DIAGNOSIS — R339 Retention of urine, unspecified: Secondary | ICD-10-CM | POA: Diagnosis not present

## 2016-06-29 DIAGNOSIS — Z800 Family history of malignant neoplasm of digestive organs: Secondary | ICD-10-CM | POA: Diagnosis not present

## 2016-06-29 DIAGNOSIS — F17210 Nicotine dependence, cigarettes, uncomplicated: Secondary | ICD-10-CM | POA: Diagnosis not present

## 2016-07-09 ENCOUNTER — Encounter: Payer: Self-pay | Admitting: Emergency Medicine

## 2016-07-09 ENCOUNTER — Emergency Department
Admission: EM | Admit: 2016-07-09 | Discharge: 2016-07-09 | Disposition: A | Discharge status: Home / Self Care | Payer: Self-pay | Attending: Emergency Medicine | Admitting: Emergency Medicine

## 2016-07-09 DIAGNOSIS — F17210 Nicotine dependence, cigarettes, uncomplicated: Secondary | ICD-10-CM | POA: Insufficient documentation

## 2016-07-09 DIAGNOSIS — T83010A Breakdown (mechanical) of cystostomy catheter, initial encounter: Secondary | ICD-10-CM

## 2016-07-09 DIAGNOSIS — Z85038 Personal history of other malignant neoplasm of large intestine: Secondary | ICD-10-CM | POA: Insufficient documentation

## 2016-07-09 DIAGNOSIS — Y828 Other medical devices associated with adverse incidents: Secondary | ICD-10-CM | POA: Insufficient documentation

## 2016-07-09 DIAGNOSIS — I10 Essential (primary) hypertension: Secondary | ICD-10-CM | POA: Insufficient documentation

## 2016-07-09 DIAGNOSIS — Z79899 Other long term (current) drug therapy: Secondary | ICD-10-CM | POA: Insufficient documentation

## 2016-07-09 DIAGNOSIS — T83090A Other mechanical complication of cystostomy catheter, initial encounter: Secondary | ICD-10-CM | POA: Insufficient documentation

## 2016-07-09 DIAGNOSIS — T83098A Other mechanical complication of other indwelling urethral catheter, initial encounter: Secondary | ICD-10-CM | POA: Diagnosis not present

## 2016-07-09 LAB — BASIC METABOLIC PANEL
Anion gap: 6 (ref 5–15)
BUN: 11 mg/dL (ref 6–20)
CALCIUM: 9.1 mg/dL (ref 8.9–10.3)
CHLORIDE: 111 mmol/L (ref 101–111)
CO2: 26 mmol/L (ref 22–32)
CREATININE: 0.72 mg/dL (ref 0.61–1.24)
GFR calc Af Amer: >60 mL/min (ref >60)
GFR calc non Af Amer: >60 mL/min (ref >60)
GLUCOSE: 103 mg/dL — AB (ref 65–99)
Potassium: 3.8 mmol/L (ref 3.5–5.1)
Sodium: 143 mmol/L (ref 135–145)

## 2016-07-09 LAB — URINALYSIS, COMPLETE (UACMP) WITH MICROSCOPIC
BACTERIA UA: NONE SEEN
Bilirubin Urine: NEGATIVE
Glucose, UA: NEGATIVE mg/dL
Ketones, ur: NEGATIVE mg/dL
NITRITE: NEGATIVE
PROTEIN: NEGATIVE mg/dL
SPECIFIC GRAVITY, URINE: 1.006 (ref 1.005–1.030)
pH: 5 (ref 5.0–8.0)

## 2016-07-09 NOTE — ED Provider Notes (Signed)
[facility] Emergency Department Provider Note  ____________________________________________  Time seen: Approximately 9:40 AM  I have reviewed the triage vital signs and the nursing notes.   HISTORY  Chief Complaint Suprapubic catheter clogged    HPI Gerald Odonnell is a 61 y.o. male who complains of severe pelvic pain and suprapubic catheter dysfunction. He reports that he last changed his catheter leg bag this morning and it was half full which is less than usual. He has not had any drainage since then. Reports a history of catheter obstructions. Catheter was last replaced a month ago. Denies fevers or chills or sweats. Has some low back pain.     Past Medical History:  Diagnosis Date  . Cancer ([place])    colon  . Hypertension   . S/P colon resection   . Shortness of breath dyspnea   . Urinary retention      Patient Active Problem List   Diagnosis Date Noted  . Sepsis ([place]) 09/10/2015  . Chest pain 09/10/2015  . Clostridium difficile diarrhea   . Colitis, infectious 04/23/2015  . Incomplete bladder emptying 09/10/2014  . Edema leg 09/15/2013  . Pelvic and perineal pain 09/15/2013  . Benign prostatic hyperplasia with urinary obstruction 07/22/2013     Past Surgical History:  Procedure Laterality Date  . ABDOMINAL SURGERY    . PROSTATE SURGERY  05/25/2016  . SUPRAPUBIC CATHETER INSERTION       Prior to Admission medications   Medication Sig Start Date End Date Taking? Authorizing Provider  acidophilus (RISAQUAD) CAPS capsule Take 2 capsules by mouth daily. Patient not taking: Reported on 06/21/2015 04/29/15   Epifanio Lesches, MD  diphenhydramine-acetaminophen (TYLENOL PM) 25-500 MG TABS tablet Take 1 tablet by mouth at bedtime as needed (for sleep/pain).    Historical Provider, MD  oxybutynin (DITROPAN) 5 MG tablet Take 1 tablet (5 mg total) by mouth 3 (three) times daily. Patient not taking: Reported on 06/06/2016 09/13/15   Epifanio Lesches, MD  oxyCODONE-acetaminophen (PERCOCET/ROXICET) 5-325 MG tablet Take 2 tablets by mouth every 6 (six) hours as needed for moderate pain. Patient not taking: Reported on 06/21/2015 04/29/15   Epifanio Lesches, MD  tamsulosin (FLOMAX) 0.4 MG CAPS capsule Take 1 capsule (0.4 mg total) by mouth daily. 04/29/15   Epifanio Lesches, MD     Allergies Patient has no known allergies.   Family History  Problem Relation Age of Onset  . Hypertension Other     Social History Social History  Substance Use Topics  . Smoking status: Current Every Day Smoker    Packs/day: 0.50    Types: Cigarettes  . Smokeless tobacco: Never Used  . Alcohol use No    Review of Systems  Constitutional:   No fever or chills.  ENT:   No sore throat. No rhinorrhea. Cardiovascular:   No chest pain. Respiratory:   No dyspnea or cough. Gastrointestinal:   Negative for abdominal pain, vomiting and diarrhea.  Genitourinary:   Chronic suprapubic catheter due to urinary retention and prostate cancer. Musculoskeletal:   Negative for focal pain or swelling Neurological:   Negative for headaches 10-point ROS otherwise negative.  ____________________________________________   PHYSICAL EXAM:  VITAL SIGNS: ED Triage Vitals  Enc Vitals Group     BP 07/09/16 0756 (!) 196/108     Pulse Rate 07/09/16 0756 (!) 106     Resp 07/09/16 0756 18     Temp 07/09/16 0756 98.6 F (37 C)     Temp Source  07/09/16 0756 Oral     SpO2 07/09/16 0756 97 %     Weight 07/09/16 0756 233 lb (105.7 kg)     Height 07/09/16 0756 5' 10.5" (1.791 m)     Head Circumference --      Peak Flow --      Pain Score 07/09/16 0757 10     Pain Loc --      Pain Edu? --      Excl. in [place]? --     Vital signs reviewed, nursing assessments reviewed.   Constitutional:   Alert and oriented. Well appearing and in no distress. Eyes:   No scleral icterus. No conjunctival pallor. PERRL. EOMI.  No nystagmus. ENT   Head:   Normocephalic and  atraumatic.   Nose:   No congestion/rhinnorhea. No septal hematoma   Mouth/Throat:   MMM, no pharyngeal erythema. No peritonsillar mass.    Neck:   No stridor. No SubQ emphysema. No meningismus. Hematological/Lymphatic/Immunilogical:   No cervical lymphadenopathy. Cardiovascular:   RRR. Symmetric bilateral radial and DP pulses.  No murmurs.  Respiratory:   Normal respiratory effort without tachypnea nor retractions. Breath sounds are clear and equal bilaterally. No wheezes/rales/rhonchi. Gastrointestinal:   Soft With suprapubic tenderness. Distended bladder.. There is no CVA tenderness.  No rebound, rigidity, or guarding. No hernias Genitourinary:   Unremarkable Musculoskeletal:   Normal range of motion in all extremities. No joint effusions.  No lower extremity tenderness.  No edema. Neurologic:   Normal speech and language.  CN 2-10 normal. Motor grossly intact. No gross focal neurologic deficits are appreciated.  Skin:    Skin is warm, dry and intact. No rash noted.  No petechiae, purpura, or bullae.  ____________________________________________    LABS (pertinent positives/negatives) (all labs ordered are listed, but only abnormal results are displayed) Labs Reviewed  BASIC METABOLIC PANEL - Abnormal; Notable for the following:       Result Value   Glucose, Bld 103 (*)    All other components within normal limits  URINALYSIS, COMPLETE (UACMP) WITH MICROSCOPIC - Abnormal; Notable for the following:    Color, Urine YELLOW (*)    APPearance CLEAR (*)    Hgb urine dipstick LARGE (*)    Leukocytes, UA TRACE (*)    Squamous Epithelial / LPF 0-5 (*)    All other components within normal limits  URINE CULTURE   ____________________________________________   EKG    ____________________________________________    RADIOLOGY  No results found.  ____________________________________________   PROCEDURES Procedures Suprapubic catheter replacement performed by me  07-09-16 8:15am Previous catheter balloon was deflated, catheter removed. There is spontaneous drainage of urine through the stoma. This was allowed to drain to completion. Using sterile technique, Betadine prep, lubricant gel, a new 1 French rubber catheter was inserted into the suprapubic tract. This was passed easily into the bladder, inflated with 10 mL of saline, gently pulled back until resistance was met. Patient tolerated procedure well. No complication. Pain much improved. ____________________________________________   INITIAL IMPRESSION / ASSESSMENT AND PLAN / ED COURSE  Pertinent labs & imaging results that were available during my care of the patient were reviewed by me and considered in my medical decision making (see chart for details).  Patient well appearing no acute distress, presents with suprapubic pain due to suprapubic catheter obstruction. Exam otherwise unremarkable. Catheter was replaced with a large amount of urine output. This was unable to be quantified as it drained spontaneously into towels instead of into a collection  system. This had to be allowed to drain completely so that the site to be prepped for sterile insertion of the new catheter. Pain is now resolved with urine drained a new catheter in place. I'll check his renal function and urinalysis. Plan for outpatient follow-up, pending labs.         ____________________________________________   FINAL CLINICAL IMPRESSION(S) / ED DIAGNOSES  Final diagnoses:  Suprapubic catheter dysfunction, initial encounter [facility])      New Prescriptions   No medications on file     Portions of this note were generated with dragon dictation software. Dictation errors may occur despite best attempts at proofreading.    Carrie Mew, MD 07/09/16 1027

## 2016-07-09 NOTE — ED Notes (Signed)
Pt presents with clogged suprapubic catheter. Pt pacing and in obvious pain. Abdomen tender upon palpation.

## 2016-07-09 NOTE — ED Triage Notes (Signed)
Pt reports suprapubic catheter is clogged.

## 2016-07-09 NOTE — ED Notes (Signed)
Pt discharged home after verbalizing understanding of discharge instructions; nad noted. 

## 2016-07-11 LAB — URINE CULTURE

## 2016-07-13 NOTE — Progress Notes (Signed)
ED Antimicrobial Stewardship Positive Culture Follow Up   Gerald Odonnell is an 61 y.o. male who presented to [facility] on 07/09/2016 with a chief complaint of  Chief Complaint  Patient presents with  . Suprapubic catheter clogged    Recent Results (from the past 720 hour(s))  Urine culture     Status: Abnormal   Collection Time: 07/09/16  9:05 AM  Result Value Ref Range Status   Specimen Description URINE, RANDOM  Final   Special Requests NONE  Final   Culture (A)  Final    80,000 COLONIES/mL STAPHYLOCOCCUS SPECIES (COAGULASE NEGATIVE)   Report Status 07/11/2016 FINAL  Final   Organism ID, Bacteria STAPHYLOCOCCUS SPECIES (COAGULASE NEGATIVE) (A)  Final      Susceptibility   Staphylococcus species (coagulase negative) - MIC*    CIPROFLOXACIN >=8 RESISTANT Resistant     GENTAMICIN >=16 RESISTANT Resistant     NITROFURANTOIN <=16 SENSITIVE Sensitive     OXACILLIN >=4 RESISTANT Resistant     TETRACYCLINE 2 SENSITIVE Sensitive     VANCOMYCIN 1 SENSITIVE Sensitive     TRIMETH/SULFA 160 RESISTANT Resistant     CLINDAMYCIN >=8 RESISTANT Resistant     RIFAMPIN <=0.5 SENSITIVE Sensitive     Inducible Clindamycin NEGATIVE Sensitive     * 80,000 COLONIES/mL STAPHYLOCOCCUS SPECIES (COAGULASE NEGATIVE)    [x]  Patient discharged originally without antimicrobial agent    Patient with Suprapubic cathether, was Afebrile without significant Urinalysis result at [facility] ED visit. No WBC. Catheter was changed 07/09/16.  07/13/16- Called [facility] urology [phone]) since [name] is a patient there and spoke with RN. Will fax copy of Urine culture results [phone]).  Patient has appt scheduled for 07/27/16.   Chinita Greenland PharmD Clinical Pharmacist 07/13/2016     Kindell Strada A 07/13/2016, 12:59 PM

## 2016-07-25 DIAGNOSIS — F17210 Nicotine dependence, cigarettes, uncomplicated: Secondary | ICD-10-CM | POA: Diagnosis not present

## 2016-07-25 DIAGNOSIS — Z800 Family history of malignant neoplasm of digestive organs: Secondary | ICD-10-CM | POA: Diagnosis not present

## 2016-07-25 DIAGNOSIS — N3289 Other specified disorders of bladder: Secondary | ICD-10-CM | POA: Diagnosis not present

## 2016-07-25 DIAGNOSIS — C61 Malignant neoplasm of prostate: Secondary | ICD-10-CM | POA: Diagnosis not present

## 2016-07-25 DIAGNOSIS — Z85038 Personal history of other malignant neoplasm of large intestine: Secondary | ICD-10-CM | POA: Diagnosis not present

## 2016-07-25 DIAGNOSIS — R339 Retention of urine, unspecified: Secondary | ICD-10-CM | POA: Diagnosis not present

## 2016-07-26 ENCOUNTER — Emergency Department
Admission: EM | Admit: 2016-07-26 | Discharge: 2016-07-26 | Disposition: A | Discharge status: Home / Self Care | Payer: Medicare HMO | Source: Home / Self Care

## 2016-07-26 ENCOUNTER — Encounter: Payer: Self-pay | Admitting: Emergency Medicine

## 2016-07-26 ENCOUNTER — Emergency Department
Admission: EM | Admit: 2016-07-26 | Discharge: 2016-07-26 | Disposition: A | Discharge status: Home / Self Care | Payer: Medicare HMO | Attending: Emergency Medicine | Admitting: Emergency Medicine

## 2016-07-26 DIAGNOSIS — T83498A Other mechanical complication of other prosthetic devices, implants and grafts of genital tract, initial encounter: Secondary | ICD-10-CM | POA: Diagnosis not present

## 2016-07-26 DIAGNOSIS — I10 Essential (primary) hypertension: Secondary | ICD-10-CM | POA: Diagnosis not present

## 2016-07-26 DIAGNOSIS — Z85038 Personal history of other malignant neoplasm of large intestine: Secondary | ICD-10-CM | POA: Diagnosis not present

## 2016-07-26 DIAGNOSIS — T83010A Breakdown (mechanical) of cystostomy catheter, initial encounter: Secondary | ICD-10-CM

## 2016-07-26 DIAGNOSIS — Y733 Surgical instruments, materials and gastroenterology and urology devices (including sutures) associated with adverse incidents: Secondary | ICD-10-CM | POA: Diagnosis not present

## 2016-07-26 DIAGNOSIS — N3000 Acute cystitis without hematuria: Secondary | ICD-10-CM | POA: Diagnosis not present

## 2016-07-26 DIAGNOSIS — Y732 Prosthetic and other implants, materials and accessory gastroenterology and urology devices associated with adverse incidents: Secondary | ICD-10-CM | POA: Insufficient documentation

## 2016-07-26 DIAGNOSIS — T839XXA Unspecified complication of genitourinary prosthetic device, implant and graft, initial encounter: Secondary | ICD-10-CM | POA: Insufficient documentation

## 2016-07-26 DIAGNOSIS — F17210 Nicotine dependence, cigarettes, uncomplicated: Secondary | ICD-10-CM | POA: Diagnosis not present

## 2016-07-26 DIAGNOSIS — Z5321 Procedure and treatment not carried out due to patient leaving prior to being seen by health care provider: Secondary | ICD-10-CM | POA: Insufficient documentation

## 2016-07-26 DIAGNOSIS — R1032 Left lower quadrant pain: Secondary | ICD-10-CM | POA: Diagnosis not present

## 2016-07-26 DIAGNOSIS — T83098A Other mechanical complication of other indwelling urethral catheter, initial encounter: Secondary | ICD-10-CM | POA: Diagnosis not present

## 2016-07-26 DIAGNOSIS — T8189XA Other complications of procedures, not elsewhere classified, initial encounter: Secondary | ICD-10-CM | POA: Diagnosis not present

## 2016-07-26 LAB — URINALYSIS, COMPLETE (UACMP) WITH MICROSCOPIC
Bacteria, UA: NONE SEEN
Bilirubin Urine: NEGATIVE
Glucose, UA: NEGATIVE mg/dL
Ketones, ur: NEGATIVE mg/dL
Nitrite: POSITIVE — AB
Protein, ur: 30 mg/dL — AB
SPECIFIC GRAVITY, URINE: 1.016 (ref 1.005–1.030)
Squamous Epithelial / LPF: NONE SEEN
pH: 6 (ref 5.0–8.0)

## 2016-07-26 MED ORDER — LIDOCAINE HCL 2 % EX GEL
1.0000 "application " | Freq: Once | CUTANEOUS | Status: AC
  Administered 2016-07-26: 1 via URETHRAL

## 2016-07-26 MED ORDER — LIDOCAINE HCL 2 % EX GEL
CUTANEOUS | Status: AC
  Administered 2016-07-26: 1 via URETHRAL
  Filled 2016-07-26: qty 10

## 2016-07-26 MED ORDER — NITROFURANTOIN MONOHYD MACRO 100 MG PO CAPS: 100.0000 mg | ORAL_CAPSULE | Freq: Two times a day (BID) | ORAL | 0 refills | Status: AC

## 2016-07-26 MED ORDER — NITROFURANTOIN MONOHYD MACRO 100 MG PO CAPS
100.0000 mg | ORAL_CAPSULE | Freq: Once | ORAL | Status: AC
  Administered 2016-07-26: 100 mg via ORAL
  Filled 2016-07-26: qty 1

## 2016-07-26 NOTE — Discharge Instructions (Signed)
Please return to the emergency department for any new or worsening symptoms such as if her catheter becomes clogged, or for any other concerns. Please complete all of your antibiotics as prescribed and follow-up with your urologist in 2 weeks as scheduled.  It was a pleasure to take care of you today, and thank you for coming to our emergency department.  If you have any questions or concerns before leaving please ask the nurse to grab me and I'm more than happy to go through your aftercare instructions again.  If you were prescribed any opioid pain medication today such as Norco, Vicodin, Percocet, morphine, hydrocodone, or oxycodone please make sure you do not drive when you are taking this medication as it can alter your ability to drive safely.  If you have any concerns once you are home that you are not improving or are in fact getting worse before you can make it to your follow-up appointment, please do not hesitate to call 911 and come back for further evaluation.  Darel Hong MD  Results for orders placed or performed during the hospital encounter of 07/26/16  Urinalysis, Complete w Microscopic  Result Value Ref Range   Color, Urine YELLOW (A) YELLOW   APPearance HAZY (A) CLEAR   Specific Gravity, Urine 1.016 1.005 - 1.030   pH 6.0 5.0 - 8.0   Glucose, UA NEGATIVE NEGATIVE mg/dL   Hgb urine dipstick MODERATE (A) NEGATIVE   Bilirubin Urine NEGATIVE NEGATIVE   Ketones, ur NEGATIVE NEGATIVE mg/dL   Protein, ur 30 (A) NEGATIVE mg/dL   Nitrite POSITIVE (A) NEGATIVE   Leukocytes, UA SMALL (A) NEGATIVE   RBC / HPF TOO NUMEROUS TO COUNT 0 - 5 RBC/hpf   WBC, UA TOO NUMEROUS TO COUNT 0 - 5 WBC/hpf   Bacteria, UA NONE SEEN NONE SEEN   Squamous Epithelial / LPF NONE SEEN NONE SEEN   Mucous PRESENT    Amorphous Crystal PRESENT

## 2016-07-26 NOTE — ED Triage Notes (Signed)
Pt reports suprapubic catheter came out yesterday, he was able to put it back in place yesterday but it has been clogged since. Pt reports he is able to urinate through his penis but it is painful. Pt doesn't go back to urologist until 4/16.

## 2016-07-26 NOTE — ED Notes (Signed)
MD at bedside with pt. MD has medication and catheter at bedside.

## 2016-07-26 NOTE — ED Triage Notes (Signed)
Pt reports suprapubic catheter came out yesterday, he was able to put it back in place yesterday but it has been clogged since. Pt reports he is able to urinate through his penis but it is painful. Pt doesn't go back to urologist until 4/16. Pt now reports a small amount of bleeding by the suprapubic this time.

## 2016-07-26 NOTE — ED Provider Notes (Signed)
[facility] Emergency Department Provider Note  ____________________________________________   First MD Initiated Contact with Patient 07/26/16 1823     (approximate)  I have reviewed the triage vital signs and the nursing notes.   HISTORY  Chief Complaint suprapubic clogged    HPI Gerald Odonnell is a 61 y.o. male who self presents to the emergency department with a clogged suprapubic catheter. He has had the catheter since January and still is able to urinate from his penis although it is quite painful. Catheter secondary to prostate cancer. Yesterday he inadvertently pulled out the catheter but replaced it himself. He has noted discolorationto his urine and blood past several days. No fevers or chills. No back pain. No flank pain. He has an appointment to see his urologist on April 16.   Past Medical History:  Diagnosis Date  . Cancer ([place])    colon  . Hypertension   . S/P colon resection   . Shortness of breath dyspnea   . Urinary retention     Patient Active Problem List   Diagnosis Date Noted  . Sepsis ([place]) 09/10/2015  . Chest pain 09/10/2015  . Clostridium difficile diarrhea   . Colitis, infectious 04/23/2015  . Incomplete bladder emptying 09/10/2014  . Edema leg 09/15/2013  . Pelvic and perineal pain 09/15/2013  . Benign prostatic hyperplasia with urinary obstruction 07/22/2013    Past Surgical History:  Procedure Laterality Date  . ABDOMINAL SURGERY    . PROSTATE SURGERY  05/25/2016  . SUPRAPUBIC CATHETER INSERTION      Prior to Admission medications   Medication Sig Start Date End Date Taking? Authorizing Provider  acidophilus (RISAQUAD) CAPS capsule Take 2 capsules by mouth daily. Patient not taking: Reported on 06/21/2015 04/29/15   Epifanio Lesches, MD  diphenhydramine-acetaminophen (TYLENOL PM) 25-500 MG TABS tablet Take 1 tablet by mouth at bedtime as needed (for sleep/pain).    Historical Provider, MD  nitrofurantoin,  macrocrystal-monohydrate, (MACROBID) 100 MG capsule Take 1 capsule (100 mg total) by mouth 2 (two) times daily. 07/26/16 08/02/16  Darel Hong, MD  oxybutynin (DITROPAN) 5 MG tablet Take 1 tablet (5 mg total) by mouth 3 (three) times daily. Patient not taking: Reported on 06/06/2016 09/13/15   Epifanio Lesches, MD  oxyCODONE-acetaminophen (PERCOCET/ROXICET) 5-325 MG tablet Take 2 tablets by mouth every 6 (six) hours as needed for moderate pain. Patient not taking: Reported on 06/21/2015 04/29/15   Epifanio Lesches, MD  tamsulosin (FLOMAX) 0.4 MG CAPS capsule Take 1 capsule (0.4 mg total) by mouth daily. 04/29/15   Epifanio Lesches, MD    Allergies Patient has no known allergies.  Family History  Problem Relation Age of Onset  . Hypertension Other     Social History Social History  Substance Use Topics  . Smoking status: Current Every Day Smoker    Packs/day: 0.50    Types: Cigarettes  . Smokeless tobacco: Never Used  . Alcohol use No    Review of Systems Constitutional: No fever/chills Eyes: No visual changes. ENT: No sore throat. Cardiovascular: Denies chest pain. Respiratory: Denies shortness of breath. Gastrointestinal: No abdominal pain.  No nausea, no vomiting.  No diarrhea.  No constipation. Genitourinary: Negative for dysuria. Musculoskeletal: Negative for back pain. Skin: Negative for rash. Neurological: Negative for headaches, focal weakness or numbness.  10-point ROS otherwise negative.  ____________________________________________   PHYSICAL EXAM:  VITAL SIGNS: ED Triage Vitals [07/26/16 1626]  Enc Vitals Group     BP  Pulse      Resp      Temp      Temp src      SpO2      Weight 233 lb (105.7 kg)     Height 5' 10" (1.778 m)     Head Circumference      Peak Flow      Pain Score 4     Pain Loc      Pain Edu?      Excl. in [place]?     Constitutional: Alert and oriented x 4 well appearing nontoxic no diaphoresis speaks in full, clear  sentences Eyes: PERRL EOMI. Head: Atraumatic. Nose: No congestion/rhinnorhea. Mouth/Throat: No trismus Neck: No stridor.   Cardiovascular: Normal rate, regular rhythm. Grossly normal heart sounds.  Good peripheral circulation. Respiratory: Normal respiratory effort.  No retractions. Lungs CTAB and moving good air Gastrointestinal: Soft nondistended nontender no rebound no guarding no peritonitis suprapubic catheter placed with crusting around the stoma  Musculoskeletal: No lower extremity edema   Neurologic:  Normal speech and language. No gross focal neurologic deficits are appreciated. Skin:  Skin is warm, dry and intact. No rash noted. Psychiatric: Mood and affect are normal. Speech and behavior are normal.    ____________________________________________   DIFFERENTIAL  Urinary tract infection, pyelonephritis, clogged suprapubic catheter ____________________________________________   LABS (all labs ordered are listed, but only abnormal results are displayed)  Labs Reviewed  URINALYSIS, COMPLETE (UACMP) WITH MICROSCOPIC - Abnormal; Notable for the following:       Result Value   Color, Urine YELLOW (*)    APPearance HAZY (*)    Hgb urine dipstick MODERATE (*)    Protein, ur 30 (*)    Nitrite POSITIVE (*)    Leukocytes, UA SMALL (*)    All other components within normal limits  URINE CULTURE    Urinalysis consistent with infection __________________________________________  EKG   ____________________________________________  RADIOLOGY   ____________________________________________   PROCEDURES  Procedure(s) performed: yes  I changed the patient's suprapubic catheter in the usual sterile fashion. First I prepped the area with chlorhexidine and then deflated the old catheter. I then removed the catheter and under sterile conditions we placed a 16 French Foley catheter expressing immediate return of urine. I then inflated the Foley balloon with 10 cc of normal  saline and pulled back gently and met resistance.  Procedures  Critical Care performed: no  ____________________________________________   INITIAL IMPRESSION / ASSESSMENT AND PLAN / ED COURSE  Pertinent labs & imaging results that were available during my care of the patient were reviewed by me and considered in my medical decision making (see chart for details).  The patient's suprapubic catheter has been in place for 4 months so I feel comfortable replacing it. I removed his previous catheter replaced a new one under usual sterile conditions with no complications.      ____________________________________________ ----------------------------------------- 7:43 PM on 07/26/2016 -----------------------------------------  The patient's urinalysis is consistent with infection. Based on previous cultures have been coag-negative staph with multiple resistance. He is sensitive to Macrobid. Culture sent and I will give him a week of Macrobid.  FINAL CLINICAL IMPRESSION(S) / ED DIAGNOSES  Final diagnoses:  Suprapubic catheter dysfunction, initial encounter ([place])  Acute cystitis without hematuria      NEW MEDICATIONS STARTED DURING THIS VISIT:  New Prescriptions   NITROFURANTOIN, MACROCRYSTAL-MONOHYDRATE, (MACROBID) 100 MG CAPSULE    Take 1 capsule (100 mg total) by mouth 2 (two) times daily.  Note:  This document was prepared using Dragon voice recognition software and may include unintentional dictation errors.     Darel Hong, MD 07/26/16 2015

## 2016-07-29 LAB — URINE CULTURE

## 2016-08-10 DIAGNOSIS — N390 Urinary tract infection, site not specified: Secondary | ICD-10-CM | POA: Diagnosis not present

## 2016-08-10 DIAGNOSIS — R339 Retention of urine, unspecified: Secondary | ICD-10-CM | POA: Diagnosis not present

## 2016-08-13 DIAGNOSIS — I5040 Unspecified combined systolic (congestive) and diastolic (congestive) heart failure: Secondary | ICD-10-CM | POA: Diagnosis not present

## 2016-08-13 DIAGNOSIS — I10 Essential (primary) hypertension: Secondary | ICD-10-CM | POA: Diagnosis not present

## 2016-08-13 DIAGNOSIS — E782 Mixed hyperlipidemia: Secondary | ICD-10-CM | POA: Diagnosis not present

## 2016-08-13 DIAGNOSIS — Z0001 Encounter for general adult medical examination with abnormal findings: Secondary | ICD-10-CM | POA: Diagnosis not present

## 2016-08-13 DIAGNOSIS — Z1211 Encounter for screening for malignant neoplasm of colon: Secondary | ICD-10-CM | POA: Diagnosis not present

## 2016-08-13 DIAGNOSIS — R0602 Shortness of breath: Secondary | ICD-10-CM | POA: Diagnosis not present

## 2016-08-17 ENCOUNTER — Ambulatory Visit
Admission: RE | Admit: 2016-08-17 | Discharge: 2016-08-17 | Disposition: A | Discharge status: Home / Self Care | Payer: Medicare HMO | Source: Ambulatory Visit | Attending: Internal Medicine | Admitting: Internal Medicine

## 2016-08-17 ENCOUNTER — Other Ambulatory Visit: Payer: Self-pay | Admitting: Internal Medicine

## 2016-08-17 DIAGNOSIS — R0602 Shortness of breath: Secondary | ICD-10-CM | POA: Insufficient documentation

## 2016-08-17 DIAGNOSIS — Z1211 Encounter for screening for malignant neoplasm of colon: Secondary | ICD-10-CM | POA: Diagnosis not present

## 2016-08-17 DIAGNOSIS — J9811 Atelectasis: Secondary | ICD-10-CM | POA: Insufficient documentation

## 2016-08-21 ENCOUNTER — Encounter: Payer: Self-pay | Admitting: Emergency Medicine

## 2016-08-21 ENCOUNTER — Emergency Department
Admission: EM | Admit: 2016-08-21 | Discharge: 2016-08-21 | Disposition: A | Discharge status: Home / Self Care | Payer: Medicare HMO | Attending: Emergency Medicine | Admitting: Emergency Medicine

## 2016-08-21 DIAGNOSIS — I10 Essential (primary) hypertension: Secondary | ICD-10-CM | POA: Insufficient documentation

## 2016-08-21 DIAGNOSIS — T83098A Other mechanical complication of other indwelling urethral catheter, initial encounter: Secondary | ICD-10-CM | POA: Insufficient documentation

## 2016-08-21 DIAGNOSIS — T83091A Other mechanical complication of indwelling urethral catheter, initial encounter: Secondary | ICD-10-CM

## 2016-08-21 DIAGNOSIS — F17210 Nicotine dependence, cigarettes, uncomplicated: Secondary | ICD-10-CM | POA: Insufficient documentation

## 2016-08-21 DIAGNOSIS — Z79899 Other long term (current) drug therapy: Secondary | ICD-10-CM | POA: Diagnosis not present

## 2016-08-21 DIAGNOSIS — Z800 Family history of malignant neoplasm of digestive organs: Secondary | ICD-10-CM | POA: Diagnosis not present

## 2016-08-21 DIAGNOSIS — R1030 Lower abdominal pain, unspecified: Secondary | ICD-10-CM | POA: Diagnosis not present

## 2016-08-21 DIAGNOSIS — N3289 Other specified disorders of bladder: Secondary | ICD-10-CM | POA: Diagnosis not present

## 2016-08-21 DIAGNOSIS — Y828 Other medical devices associated with adverse incidents: Secondary | ICD-10-CM | POA: Diagnosis not present

## 2016-08-21 DIAGNOSIS — R339 Retention of urine, unspecified: Secondary | ICD-10-CM | POA: Insufficient documentation

## 2016-08-21 DIAGNOSIS — Z85038 Personal history of other malignant neoplasm of large intestine: Secondary | ICD-10-CM | POA: Diagnosis not present

## 2016-08-21 DIAGNOSIS — C61 Malignant neoplasm of prostate: Secondary | ICD-10-CM | POA: Diagnosis not present

## 2016-08-21 LAB — URINALYSIS, COMPLETE (UACMP) WITH MICROSCOPIC
BILIRUBIN URINE: NEGATIVE
Bacteria, UA: NONE SEEN
GLUCOSE, UA: NEGATIVE mg/dL
KETONES UR: NEGATIVE mg/dL
Nitrite: NEGATIVE
Protein, ur: NEGATIVE mg/dL
Specific Gravity, Urine: 1.006 (ref 1.005–1.030)
Squamous Epithelial / LPF: NONE SEEN
pH: 6 (ref 5.0–8.0)

## 2016-08-21 MED ORDER — LIDOCAINE HCL 2 % EX GEL
1.0000 "application " | Freq: Once | CUTANEOUS | Status: AC
  Administered 2016-08-21: 1 via URETHRAL

## 2016-08-21 MED ORDER — LIDOCAINE HCL 2 % EX GEL
CUTANEOUS | Status: AC
Start: 2016-08-21 — End: 2016-08-21
  Administered 2016-08-21: 1 via URETHRAL
  Filled 2016-08-21: qty 10

## 2016-08-21 NOTE — ED Triage Notes (Signed)
Pt ambulatory to triage with steady gait, no distress noted. Pt reports he has had an indwelling catheter, placed at [facility] on the 16th, has been draining clear and free until this AM. Pt reports urinary retention with lower abdominal pain.

## 2016-08-21 NOTE — ED Provider Notes (Addendum)
[facility] Emergency Department Provider Note   ____________________________________________   First MD Initiated Contact with Patient 08/21/16 0602     (approximate)  I have reviewed the triage vital signs and the nursing notes.   HISTORY  Chief Complaint Urinary Retention    HPI Gerald Odonnell is a 61 y.o. male who has a history of prostate cancer and keeps having his Foley plug up. It's plugged up several times lately last was on the 16th when he was at [facility] and had it replaced. Patient complains of severe pain at present and some leakage around the Foley catheter.   Past Medical History:  Diagnosis Date  . Cancer ([place])    colon  . Hypertension   . S/P colon resection   . Shortness of breath dyspnea   . Urinary retention     Patient Active Problem List   Diagnosis Date Noted  . Sepsis ([place]) 09/10/2015  . Chest pain 09/10/2015  . Clostridium difficile diarrhea   . Colitis, infectious 04/23/2015  . Incomplete bladder emptying 09/10/2014  . Edema leg 09/15/2013  . Pelvic and perineal pain 09/15/2013  . Benign prostatic hyperplasia with urinary obstruction 07/22/2013    Past Surgical History:  Procedure Laterality Date  . ABDOMINAL SURGERY    . PROSTATE SURGERY  05/25/2016  . SUPRAPUBIC CATHETER INSERTION      Prior to Admission medications   Medication Sig Start Date End Date Taking? Authorizing Provider  acidophilus (RISAQUAD) CAPS capsule Take 2 capsules by mouth daily. Patient not taking: Reported on 06/21/2015 04/29/15   Epifanio Lesches, MD  diphenhydramine-acetaminophen (TYLENOL PM) 25-500 MG TABS tablet Take 1 tablet by mouth at bedtime as needed (for sleep/pain).    Historical Provider, MD  oxybutynin (DITROPAN) 5 MG tablet Take 1 tablet (5 mg total) by mouth 3 (three) times daily. Patient not taking: Reported on 06/06/2016 09/13/15   Epifanio Lesches, MD  oxyCODONE-acetaminophen (PERCOCET/ROXICET) 5-325 MG tablet Take 2  tablets by mouth every 6 (six) hours as needed for moderate pain. Patient not taking: Reported on 06/21/2015 04/29/15   Epifanio Lesches, MD  tamsulosin (FLOMAX) 0.4 MG CAPS capsule Take 1 capsule (0.4 mg total) by mouth daily. 04/29/15   Epifanio Lesches, MD    Allergies Patient has no known allergies.  Family History  Problem Relation Age of Onset  . Hypertension Other     Social History Social History  Substance Use Topics  . Smoking status: Current Every Day Smoker    Packs/day: 0.50    Types: Cigarettes  . Smokeless tobacco: Never Used  . Alcohol use No    Review of Systems  Constitutional: No fever/chills Eyes: No visual changes. ENT: No sore throat. Cardiovascular: Denies chest pain. Respiratory: Denies shortness of breath. Gastrointestinal: No abdominal pain.  No nausea, no vomiting.  No diarrhea.  No constipation. Genitourinary: Negative for dysuria. Musculoskeletal: Negative for back pain.   ____________________________________________   PHYSICAL EXAM:  VITAL SIGNS: ED Triage Vitals  Enc Vitals Group     BP 08/21/16 0554 (!) 162/102     Pulse Rate 08/21/16 0554 (!) 102     Resp 08/21/16 0554 18     Temp 08/21/16 0554 98.8 F (37.1 C)     Temp Source 08/21/16 0554 Oral     SpO2 08/21/16 0554 98 %     Weight 08/21/16 0551 233 lb (105.7 kg)     Height --      Head Circumference --  Peak Flow --      Pain Score --      Pain Loc --      Pain Edu? --      Excl. in [place]? --     Constitutional: Alert and oriented. Well appearing and in  acute distress. Eyes: Conjunctivae are normal. PERRL. EOMI. Head: Atraumatic. Nose: No congestion/rhinnorhea. Mouth/Throat: Mucous membranes are moist.  Oropharynx non-erythematous. Neck: No stridor.  Gastrointestinal: Soft and tender suprapubically. No distention. No abdominal bruits. No CVA tenderness. Genitourinary:Suprapubic Foley in place but not draining Musculoskeletal: No lower extremity tenderness some  edema. Venous stasis changes No joint effusions. Neurologic:  Normal speech and language. No gross focal neurologic deficits are appreciated. No gait instability.  ____________________________________________   LABS (all labs ordered are listed, but only abnormal results are displayed)  Labs Reviewed  URINALYSIS, COMPLETE (UACMP) WITH MICROSCOPIC - Abnormal; Notable for the following:       Result Value   Color, Urine STRAW (*)    APPearance CLEAR (*)    Hgb urine dipstick MODERATE (*)    Leukocytes, UA SMALL (*)    All other components within normal limits  URINE CULTURE   ____________________________________________  EKG   ____________________________________________  RADIOLOGY   ____________________________________________   PROCEDURES  Procedure(s) performed:  Procedures  Critical Care performed:  ____________________________________________   INITIAL IMPRESSION / ASSESSMENT AND PLAN / ED COURSE  Pertinent labs & imaging results that were available during my care of the patient were reviewed by me and considered in my medical decision making (see chart for details).      Foley had some difficulty deflating but then was removed easily. New Foley placed urinalysis clear there is a few cells under the microscope. This appears to be his baseline consistent with chronically catheterized urine.     ____________________________________________   FINAL CLINICAL IMPRESSION(S) / ED DIAGNOSES  Final diagnoses:  Urinary retention  Obstruction of Foley catheter, initial encounter ([place])      NEW MEDICATIONS STARTED DURING THIS VISIT:  New Prescriptions   No medications on file     Note:  This document was prepared using Dragon voice recognition software and may include unintentional dictation errors.    Nena Polio, MD 08/21/16 [place], MD 08/21/16 [phone]

## 2016-08-21 NOTE — ED Notes (Signed)
Pt reports "this happens every two weeks it seems, I was at [facility] and they put a catheter in and now it's blocked again."

## 2016-08-21 NOTE — Discharge Instructions (Signed)
Please follow-up with your regular doctor. Return for any further problems.

## 2016-08-21 NOTE — ED Notes (Signed)
Foley bag changed to leg bag.

## 2016-08-21 NOTE — ED Notes (Signed)
AAOx3.  Skin warm and dry.  NAD 

## 2016-08-24 DIAGNOSIS — C61 Malignant neoplasm of prostate: Secondary | ICD-10-CM | POA: Diagnosis not present

## 2016-08-24 DIAGNOSIS — N3289 Other specified disorders of bladder: Secondary | ICD-10-CM | POA: Diagnosis not present

## 2016-08-24 DIAGNOSIS — R339 Retention of urine, unspecified: Secondary | ICD-10-CM | POA: Diagnosis not present

## 2016-08-24 DIAGNOSIS — F17210 Nicotine dependence, cigarettes, uncomplicated: Secondary | ICD-10-CM | POA: Diagnosis not present

## 2016-08-24 DIAGNOSIS — Z800 Family history of malignant neoplasm of digestive organs: Secondary | ICD-10-CM | POA: Diagnosis not present

## 2016-08-24 DIAGNOSIS — Z85038 Personal history of other malignant neoplasm of large intestine: Secondary | ICD-10-CM | POA: Diagnosis not present

## 2016-08-24 LAB — URINE CULTURE: Culture: >=100000 — AB

## 2016-08-25 DIAGNOSIS — R609 Edema, unspecified: Secondary | ICD-10-CM | POA: Diagnosis not present

## 2016-08-25 DIAGNOSIS — I5040 Unspecified combined systolic (congestive) and diastolic (congestive) heart failure: Secondary | ICD-10-CM | POA: Diagnosis not present

## 2016-08-25 DIAGNOSIS — Z8546 Personal history of malignant neoplasm of prostate: Secondary | ICD-10-CM | POA: Diagnosis not present

## 2016-08-25 DIAGNOSIS — I10 Essential (primary) hypertension: Secondary | ICD-10-CM | POA: Diagnosis not present

## 2016-08-25 DIAGNOSIS — R0602 Shortness of breath: Secondary | ICD-10-CM | POA: Diagnosis not present

## 2016-08-25 DIAGNOSIS — R1084 Generalized abdominal pain: Secondary | ICD-10-CM | POA: Diagnosis not present

## 2016-08-25 NOTE — Progress Notes (Signed)
ED Antimicrobial Stewardship Positive Culture Follow Up   Gerald Odonnell is an 61 y.o. male who presented to [facility] on 08/21/2016 with a chief complaint of  Chief Complaint  Patient presents with  . Urinary Retention    Recent Results (from the past 720 hour(s))  Urine culture     Status: Abnormal   Collection Time: 07/26/16  7:08 PM  Result Value Ref Range Status   Specimen Description URINE, CATHETERIZED  Final   Special Requests NONE  Final   Culture (A)  Final    >=100,000 COLONIES/mL STAPHYLOCOCCUS SPECIES (COAGULASE NEGATIVE)   Report Status 07/29/2016 FINAL  Final   Organism ID, Bacteria STAPHYLOCOCCUS SPECIES (COAGULASE NEGATIVE) (A)  Final      Susceptibility   Staphylococcus species (coagulase negative) - MIC*    CIPROFLOXACIN >=8 RESISTANT Resistant     GENTAMICIN >=16 RESISTANT Resistant     NITROFURANTOIN <=16 SENSITIVE Sensitive     OXACILLIN >=4 RESISTANT Resistant     TETRACYCLINE 2 SENSITIVE Sensitive     VANCOMYCIN 1 SENSITIVE Sensitive     TRIMETH/SULFA 160 RESISTANT Resistant     CLINDAMYCIN >=8 RESISTANT Resistant     RIFAMPIN <=0.5 SENSITIVE Sensitive     Inducible Clindamycin NEGATIVE Sensitive     * >=100,000 COLONIES/mL STAPHYLOCOCCUS SPECIES (COAGULASE NEGATIVE)  Urine culture     Status: Abnormal   Collection Time: 08/21/16  6:37 AM  Result Value Ref Range Status   Specimen Description URINE, CATHETERIZED  Final   Special Requests NONE  Final   Culture >=100,000 COLONIES/mL STENOTROPHOMONAS MALTOPHILIA (A)  Final   Report Status 08/24/2016 FINAL  Final   Organism ID, Bacteria STENOTROPHOMONAS MALTOPHILIA (A)  Final      Susceptibility   Stenotrophomonas maltophilia - MIC*    LEVOFLOXACIN 0.25 SENSITIVE Sensitive     TRIMETH/SULFA <=20 SENSITIVE Sensitive     * >=100,000 COLONIES/mL STENOTROPHOMONAS MALTOPHILIA    [x]  Patient discharged originally without antimicrobial agent and treatment is now indicated  Patient had foley catheter  changed in the ED. Patient sees [facility] urology. Spoke to ED physician about faxing over ED culture report to Urology office. MD is ok with the plan. [facility] Urology at [phone] and faxed over ED culture report to [phone].  ED Provider: Dr. Rochel Brome, PharmD 08/25/2016, 1:33 PM

## 2016-09-18 DIAGNOSIS — Z435 Encounter for attention to cystostomy: Secondary | ICD-10-CM | POA: Diagnosis not present

## 2016-09-18 DIAGNOSIS — R339 Retention of urine, unspecified: Secondary | ICD-10-CM | POA: Diagnosis not present

## 2016-09-21 ENCOUNTER — Encounter: Payer: Self-pay | Admitting: *Deleted

## 2016-09-21 ENCOUNTER — Emergency Department
Admission: EM | Admit: 2016-09-21 | Discharge: 2016-09-21 | Disposition: A | Discharge status: Home / Self Care | Payer: Medicare HMO | Attending: Student in an Organized Health Care Education/Training Program | Admitting: Student in an Organized Health Care Education/Training Program

## 2016-09-21 DIAGNOSIS — R300 Dysuria: Secondary | ICD-10-CM | POA: Diagnosis not present

## 2016-09-21 DIAGNOSIS — Z79899 Other long term (current) drug therapy: Secondary | ICD-10-CM | POA: Insufficient documentation

## 2016-09-21 DIAGNOSIS — Z9359 Other cystostomy status: Secondary | ICD-10-CM

## 2016-09-21 DIAGNOSIS — F17210 Nicotine dependence, cigarettes, uncomplicated: Secondary | ICD-10-CM | POA: Insufficient documentation

## 2016-09-21 DIAGNOSIS — Z85038 Personal history of other malignant neoplasm of large intestine: Secondary | ICD-10-CM | POA: Insufficient documentation

## 2016-09-21 DIAGNOSIS — Z960 Presence of urogenital implants: Secondary | ICD-10-CM | POA: Insufficient documentation

## 2016-09-21 DIAGNOSIS — I10 Essential (primary) hypertension: Secondary | ICD-10-CM | POA: Insufficient documentation

## 2016-09-21 LAB — URINALYSIS, COMPLETE (UACMP) WITH MICROSCOPIC
Bilirubin Urine: NEGATIVE
Glucose, UA: NEGATIVE mg/dL
Ketones, ur: NEGATIVE mg/dL
Nitrite: POSITIVE — AB
PROTEIN: 100 mg/dL — AB
SQUAMOUS EPITHELIAL / LPF: NONE SEEN
Specific Gravity, Urine: 1.012 (ref 1.005–1.030)
pH: 5 (ref 5.0–8.0)

## 2016-09-21 LAB — BASIC METABOLIC PANEL
ANION GAP: 11 (ref 5–15)
BUN: 15 mg/dL (ref 6–20)
CO2: 25 mmol/L (ref 22–32)
Calcium: 9.6 mg/dL (ref 8.9–10.3)
Chloride: 103 mmol/L (ref 101–111)
Creatinine, Ser: 1.03 mg/dL (ref 0.61–1.24)
Glucose, Bld: 87 mg/dL (ref 65–99)
POTASSIUM: 3.4 mmol/L — AB (ref 3.5–5.1)
SODIUM: 139 mmol/L (ref 135–145)

## 2016-09-21 LAB — CBC
HEMATOCRIT: 43.1 % (ref 40.0–52.0)
HEMOGLOBIN: 15.3 g/dL (ref 13.0–18.0)
MCH: 35.8 pg — ABNORMAL HIGH (ref 26.0–34.0)
MCHC: 35.5 g/dL (ref 32.0–36.0)
MCV: 100.8 fL — ABNORMAL HIGH (ref 80.0–100.0)
Platelets: 157 10*3/uL (ref 150–440)
RBC: 4.28 MIL/uL — ABNORMAL LOW (ref 4.40–5.90)
RDW: 13.8 % (ref 11.5–14.5)
WBC: 8.7 10*3/uL (ref 3.8–10.6)

## 2016-09-21 MED ORDER — PHENAZOPYRIDINE HCL 100 MG PO TABS: 100.0000 mg | ORAL_TABLET | Freq: Three times a day (TID) | ORAL | 0 refills | Status: DC | PRN

## 2016-09-21 MED ORDER — PHENAZOPYRIDINE HCL 200 MG PO TABS
ORAL_TABLET | ORAL | Status: AC
  Administered 2016-09-21: 100 mg via ORAL
  Filled 2016-09-21: qty 1

## 2016-09-21 MED ORDER — MORPHINE SULFATE (PF) 4 MG/ML IV SOLN
4.0000 mg | INTRAVENOUS | Status: DC | PRN
  Administered 2016-09-21: 4 mg via INTRAMUSCULAR
  Filled 2016-09-21: qty 1

## 2016-09-21 MED ORDER — PHENAZOPYRIDINE HCL 100 MG PO TABS
100.0000 mg | ORAL_TABLET | Freq: Three times a day (TID) | ORAL | Status: DC
  Administered 2016-09-21: 100 mg via ORAL
  Filled 2016-09-21: qty 1

## 2016-09-21 MED ORDER — LIDOCAINE HCL 2 % EX GEL
1.0000 "application " | Freq: Once | CUTANEOUS | Status: DC
  Filled 2016-09-21: qty 5

## 2016-09-21 MED ORDER — CEPHALEXIN 500 MG PO CAPS
500.0000 mg | ORAL_CAPSULE | Freq: Once | ORAL | Status: AC
  Administered 2016-09-21: 500 mg via ORAL
  Filled 2016-09-21: qty 1

## 2016-09-21 MED ORDER — CEPHALEXIN 500 MG PO CAPS: 500.0000 mg | ORAL_CAPSULE | Freq: Three times a day (TID) | ORAL | 0 refills | Status: AC

## 2016-09-21 MED ORDER — LIDOCAINE HCL 2 % EX GEL
1.0000 "application " | Freq: Once | CUTANEOUS | Status: AC
  Administered 2016-09-21: 1 via URETHRAL
  Filled 2016-09-21: qty 20

## 2016-09-21 NOTE — ED Provider Notes (Signed)
[facility] Emergency Department Provider Note    None    (approximate)  I have reviewed the triage vital signs and the nursing notes.   HISTORY  Chief Complaint Dysuria    HPI Gerald Odonnell is a 61 y.o. male history colon cancer as well as urinary retention with chronic suprapubic catheter recently exchanged at [facility] on the 25th of this month presents with persistent suprapubic pain and feeling of urinary urgency. Patient feels as though his bladder is obstructed again. Has noted blood in his urine bag but this is unchanged from previous. States that it is still emptying. States that only starts having severe pain that is the will void from his penis. Denies any urethral discharge. No fevers. No nausea. Describes it as burning in nature.   Past Medical History:  Diagnosis Date  . Cancer ([place])    colon  . Hypertension   . S/P colon resection   . Shortness of breath dyspnea   . Urinary retention    Family History  Problem Relation Age of Onset  . Hypertension Other    Past Surgical History:  Procedure Laterality Date  . ABDOMINAL SURGERY    . PROSTATE SURGERY  05/25/2016  . SUPRAPUBIC CATHETER INSERTION     Patient Active Problem List   Diagnosis Date Noted  . Sepsis ([place]) 09/10/2015  . Chest pain 09/10/2015  . Clostridium difficile diarrhea   . Colitis, infectious 04/23/2015  . Incomplete bladder emptying 09/10/2014  . Edema leg 09/15/2013  . Pelvic and perineal pain 09/15/2013  . 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 Authorizing Provider  acidophilus (RISAQUAD) CAPS capsule Take 2 capsules by mouth daily. Patient not taking: Reported on 06/21/2015 04/29/15   Epifanio Lesches, MD  cephALEXin (KEFLEX) 500 MG capsule Take 1 capsule (500 mg total) by mouth 3 (three) times daily. 09/21/16 09/26/16  Merlyn Lot, MD    diphenhydramine-acetaminophen (TYLENOL PM) 25-500 MG TABS tablet Take 1 tablet by mouth at bedtime as needed (for sleep/pain).    [provider]  oxybutynin (DITROPAN) 5 MG tablet Take 1 tablet (5 mg total) by mouth 3 (three) times daily. Patient not taking: Reported on 06/06/2016 09/13/15   Epifanio Lesches, MD  oxyCODONE-acetaminophen (PERCOCET/ROXICET) 5-325 MG tablet Take 2 tablets by mouth every 6 (six) hours as needed for moderate pain. Patient not taking: Reported on 06/21/2015 04/29/15   Epifanio Lesches, MD  phenazopyridine (PYRIDIUM) 100 MG tablet Take 1 tablet (100 mg total) by mouth 3 (three) times daily as needed for pain. 09/21/16 09/21/17  Merlyn Lot, MD  tamsulosin (FLOMAX) 0.4 MG CAPS capsule Take 1 capsule (0.4 mg total) by mouth daily. 04/29/15   Epifanio Lesches, MD    Allergies Patient has no known allergies.    Social History Social History  Substance Use Topics  . Smoking status: Current Some Day Smoker    Packs/day: 0.50    Types: Cigarettes  . Smokeless tobacco: Never Used  . Alcohol use No    Review of Systems Patient denies headaches, rhinorrhea, blurry vision, numbness, shortness of breath, chest pain, edema, cough, abdominal pain, nausea, vomiting, diarrhea, dysuria, fevers, rashes or hallucinations unless otherwise stated above in HPI. ____________________________________________   PHYSICAL EXAM:  VITAL SIGNS: Vitals:   09/21/16 1350  BP: (!) 149/86  Pulse: 90  Resp: 16  Temp: 98.1 F (36.7 C)  Constitutional: Alert and oriented. Ambulating about room hold groin but no acute distress. Eyes: Conjunctivae are normal. PERRL. EOMI. Head: Atraumatic. Nose: No congestion/rhinnorhea. Mouth/Throat: Mucous membranes are moist.  Oropharynx non-erythematous. Neck: No stridor. Painless ROM. No cervical spine tenderness to palpation Hematological/Lymphatic/Immunilogical: No cervical lymphadenopathy. Cardiovascular: Normal rate,  regular rhythm. Grossly normal heart sounds.  Good peripheral circulation. Respiratory: Normal respiratory effort.  No retractions. Lungs CTAB. Gastrointestinal: Soft and nontender. No distention. No abdominal bruits. No CVA tenderness. Genitourinary: normal external male genitalia, no perineal cellulitis, blistering or discoloration.  SP cath in place and urostomy appears CDI Musculoskeletal: No lower extremity tenderness nor edema.  No joint effusions. Neurologic:  Normal speech and language. No gross focal neurologic deficits are appreciated. No gait instability. Skin:  Skin is warm, dry and intact. No rash noted. Psychiatric: Mood and affect are normal. Speech and behavior are normal.  ____________________________________________   LABS (all labs ordered are listed, but only abnormal results are displayed)  Results for orders placed or performed during the hospital encounter of 09/21/16 (from the past 24 hour(s))  Urinalysis, Complete w Microscopic     Status: Abnormal   Collection Time: 09/21/16  3:29 PM  Result Value Ref Range   Color, Urine YELLOW (A) YELLOW   APPearance HAZY (A) CLEAR   Specific Gravity, Urine 1.012 1.005 - 1.030   pH 5.0 5.0 - 8.0   Glucose, UA NEGATIVE NEGATIVE mg/dL   Hgb urine dipstick MODERATE (A) NEGATIVE   Bilirubin Urine NEGATIVE NEGATIVE   Ketones, ur NEGATIVE NEGATIVE mg/dL   Protein, ur 100 (A) NEGATIVE mg/dL   Nitrite POSITIVE (A) NEGATIVE   Leukocytes, UA LARGE (A) NEGATIVE   RBC / HPF TOO NUMEROUS TO COUNT 0 - 5 RBC/hpf   WBC, UA TOO NUMEROUS TO COUNT 0 - 5 WBC/hpf   Bacteria, UA RARE (A) NONE SEEN   Squamous Epithelial / LPF NONE SEEN NONE SEEN   Mucous PRESENT    Hyaline Casts, UA PRESENT   Basic metabolic panel     Status: Abnormal   Collection Time: 09/21/16  3:29 PM  Result Value Ref Range   Sodium 139 135 - 145 mmol/L   Potassium 3.4 (L) 3.5 - 5.1 mmol/L   Chloride 103 101 - 111 mmol/L   CO2 25 22 - 32 mmol/L   Glucose, Bld 87 65  - 99 mg/dL   BUN 15 6 - 20 mg/dL   Creatinine, Ser 1.03 0.61 - 1.24 mg/dL   Calcium 9.6 8.9 - 10.3 mg/dL   GFR calc non Af Amer >60 >60 mL/min   GFR calc Af Amer >60 >60 mL/min   Anion gap 11 5 - 15  CBC     Status: Abnormal   Collection Time: 09/21/16  3:29 PM  Result Value Ref Range   WBC 8.7 3.8 - 10.6 K/uL   RBC 4.28 (L) 4.40 - 5.90 MIL/uL   Hemoglobin 15.3 13.0 - 18.0 g/dL   HCT 43.1 40.0 - 52.0 %   MCV 100.8 (H) 80.0 - 100.0 fL   MCH 35.8 (H) 26.0 - 34.0 pg   MCHC 35.5 32.0 - 36.0 g/dL   RDW 13.8 11.5 - 14.5 %   Platelets 157 150 - 440 K/uL   ____________________________________________ ____________________________________________  RADIOLOGY  EMERGENCY DEPARTMENT ULTRASOUND  Study: Limited Ultrasound of Bladder  INDICATIONS: to assess for urinary retention and/or bladder volume prior to urinary catheter Multiple views of the bladder were obtained in real-time in the transverse and longitudinal  planes with a multi-frequency probe.  PERFORMED BY: Myself IMAGES ARCHIVED?: No LIMITATIONS:  nonr INTERPRETATION: Minimal Volume      ____________________________________________   PROCEDURES  Procedure(s) performed:  Procedures    Critical Care performed: no ____________________________________________   INITIAL IMPRESSION / ASSESSMENT AND PLAN / ED COURSE  Pertinent labs & imaging results that were available during my care of the patient were reviewed by me and considered in my medical decision making (see chart for details).  DDX: cystitis, urethritis, urinary retention, urostomy trauma  Gerald Odonnell is a 61 y.o. who presents to the ED with suprapubic pain as described above. Patient with complex past medical history with a history of urinary retention. Abdominal exam and bedside ultrasound shows no evidence of urinary retention. Foley catheter appears in place with draining urine. No evidence of discharge from his urethra. No evidence of urethral  retention. We'll check urine to evaluate for any evidence of cystitis. We'll provide symptomatically management. We'll check basic blood work.    ----------------------------------------- 4:14 PM on 09/21/2016 -----------------------------------------  Blood work is reassuring. Repeat abdominal exam is soft and benign. Patient with improvement after Pyridium as well as lidocaine around suprapubic catheter. Urine is nitrite positive and patient states that he has not been taking Pyridium at home therefore despite there is a component of acute cystitis causing the patient's pain. Based on this will scar order culture and treat with antibiotics. Patient stable for follow-up with his urologist.  ____________________________________________   FINAL CLINICAL IMPRESSION(S) / ED DIAGNOSES  Final diagnoses:  Dysuria  Suprapubic catheter ([place])      NEW MEDICATIONS STARTED DURING THIS VISIT:  New Prescriptions   CEPHALEXIN (KEFLEX) 500 MG CAPSULE    Take 1 capsule (500 mg total) by mouth 3 (three) times daily.   PHENAZOPYRIDINE (PYRIDIUM) 100 MG TABLET    Take 1 tablet (100 mg total) by mouth 3 (three) times daily as needed for pain.     Note:  This document was prepared using Dragon voice recognition software and may include unintentional dictation errors.    Merlyn Lot, MD 09/21/16 [phone]

## 2016-09-21 NOTE — ED Triage Notes (Signed)
Per patient's report, patient had suprapubic catheter bag changed two days ago at [facility]. Patient c/o penile pain that worsens with voiding.

## 2016-09-30 DIAGNOSIS — C61 Malignant neoplasm of prostate: Secondary | ICD-10-CM | POA: Diagnosis not present

## 2016-09-30 DIAGNOSIS — R339 Retention of urine, unspecified: Secondary | ICD-10-CM | POA: Diagnosis not present

## 2016-10-19 DIAGNOSIS — R339 Retention of urine, unspecified: Secondary | ICD-10-CM | POA: Diagnosis not present

## 2016-11-16 DIAGNOSIS — R339 Retention of urine, unspecified: Secondary | ICD-10-CM | POA: Diagnosis not present

## 2016-11-23 DIAGNOSIS — R339 Retention of urine, unspecified: Secondary | ICD-10-CM | POA: Diagnosis not present

## 2016-12-14 DIAGNOSIS — H60551 Acute reactive otitis externa, right ear: Secondary | ICD-10-CM | POA: Diagnosis not present

## 2016-12-14 DIAGNOSIS — L03119 Cellulitis of unspecified part of limb: Secondary | ICD-10-CM | POA: Diagnosis not present

## 2016-12-14 DIAGNOSIS — C61 Malignant neoplasm of prostate: Secondary | ICD-10-CM | POA: Diagnosis not present

## 2016-12-14 DIAGNOSIS — K921 Melena: Secondary | ICD-10-CM | POA: Diagnosis not present

## 2016-12-14 DIAGNOSIS — F17210 Nicotine dependence, cigarettes, uncomplicated: Secondary | ICD-10-CM | POA: Diagnosis not present

## 2016-12-14 DIAGNOSIS — I10 Essential (primary) hypertension: Secondary | ICD-10-CM | POA: Diagnosis not present

## 2016-12-14 DIAGNOSIS — D751 Secondary polycythemia: Secondary | ICD-10-CM | POA: Diagnosis not present

## 2016-12-14 DIAGNOSIS — B372 Candidiasis of skin and nail: Secondary | ICD-10-CM | POA: Diagnosis not present

## 2016-12-25 DIAGNOSIS — R339 Retention of urine, unspecified: Secondary | ICD-10-CM | POA: Diagnosis not present

## 2016-12-25 DIAGNOSIS — Z466 Encounter for fitting and adjustment of urinary device: Secondary | ICD-10-CM | POA: Diagnosis not present

## 2016-12-25 DIAGNOSIS — N312 Flaccid neuropathic bladder, not elsewhere classified: Secondary | ICD-10-CM | POA: Diagnosis not present

## 2017-01-12 DIAGNOSIS — Z800 Family history of malignant neoplasm of digestive organs: Secondary | ICD-10-CM | POA: Diagnosis not present

## 2017-01-12 DIAGNOSIS — C61 Malignant neoplasm of prostate: Secondary | ICD-10-CM | POA: Diagnosis not present

## 2017-01-12 DIAGNOSIS — R339 Retention of urine, unspecified: Secondary | ICD-10-CM | POA: Diagnosis not present

## 2017-01-12 DIAGNOSIS — Z85038 Personal history of other malignant neoplasm of large intestine: Secondary | ICD-10-CM | POA: Diagnosis not present

## 2017-01-12 DIAGNOSIS — N3289 Other specified disorders of bladder: Secondary | ICD-10-CM | POA: Diagnosis not present

## 2017-01-12 DIAGNOSIS — F17210 Nicotine dependence, cigarettes, uncomplicated: Secondary | ICD-10-CM | POA: Diagnosis not present

## 2017-02-13 ENCOUNTER — Emergency Department
Admission: EM | Admit: 2017-02-13 | Discharge: 2017-02-14 | Disposition: A | Discharge status: Home / Self Care | Payer: Medicare HMO | Attending: Emergency Medicine | Admitting: Emergency Medicine

## 2017-02-13 ENCOUNTER — Encounter: Payer: Self-pay | Admitting: Emergency Medicine

## 2017-02-13 ENCOUNTER — Emergency Department
Admission: EM | Admit: 2017-02-13 | Discharge: 2017-02-13 | Disposition: A | Discharge status: Home / Self Care | Payer: Medicare HMO | Source: Home / Self Care | Attending: Emergency Medicine | Admitting: Emergency Medicine

## 2017-02-13 ENCOUNTER — Emergency Department: Payer: Medicare HMO

## 2017-02-13 DIAGNOSIS — R319 Hematuria, unspecified: Principal | ICD-10-CM

## 2017-02-13 DIAGNOSIS — I10 Essential (primary) hypertension: Secondary | ICD-10-CM | POA: Insufficient documentation

## 2017-02-13 DIAGNOSIS — F17210 Nicotine dependence, cigarettes, uncomplicated: Secondary | ICD-10-CM | POA: Insufficient documentation

## 2017-02-13 DIAGNOSIS — T83198S Other mechanical complication of other urinary devices and implants, sequela: Secondary | ICD-10-CM | POA: Diagnosis present

## 2017-02-13 DIAGNOSIS — N390 Urinary tract infection, site not specified: Secondary | ICD-10-CM | POA: Insufficient documentation

## 2017-02-13 DIAGNOSIS — Z85038 Personal history of other malignant neoplasm of large intestine: Secondary | ICD-10-CM | POA: Insufficient documentation

## 2017-02-13 DIAGNOSIS — Z79899 Other long term (current) drug therapy: Secondary | ICD-10-CM | POA: Insufficient documentation

## 2017-02-13 DIAGNOSIS — Y828 Other medical devices associated with adverse incidents: Secondary | ICD-10-CM | POA: Insufficient documentation

## 2017-02-13 LAB — COMPREHENSIVE METABOLIC PANEL
ALBUMIN: 4 g/dL (ref 3.5–5.0)
ALK PHOS: 103 U/L (ref 38–126)
ALT: 27 U/L (ref 17–63)
ALT: 27 U/L (ref 17–63)
ANION GAP: 8 (ref 5–15)
AST: 42 U/L — ABNORMAL HIGH (ref 15–41)
AST: 41 U/L (ref 15–41)
Albumin: 4.1 g/dL (ref 3.5–5.0)
Alkaline Phosphatase: 120 U/L (ref 38–126)
Anion gap: 9 (ref 5–15)
BUN: 9 mg/dL (ref 6–20)
BUN: 14 mg/dL (ref 6–20)
CHLORIDE: 105 mmol/L (ref 101–111)
CO2: 24 mmol/L (ref 22–32)
CO2: 26 mmol/L (ref 22–32)
CREATININE: 1.05 mg/dL (ref 0.61–1.24)
Calcium: 9.5 mg/dL (ref 8.9–10.3)
Calcium: 9.4 mg/dL (ref 8.9–10.3)
Chloride: 102 mmol/L (ref 101–111)
Creatinine, Ser: 0.89 mg/dL (ref 0.61–1.24)
GFR calc non Af Amer: >60 mL/min (ref >60)
Glucose, Bld: 118 mg/dL — ABNORMAL HIGH (ref 65–99)
Glucose, Bld: 102 mg/dL — ABNORMAL HIGH (ref 65–99)
Potassium: 3.9 mmol/L (ref 3.5–5.1)
Potassium: 3.7 mmol/L (ref 3.5–5.1)
SODIUM: 137 mmol/L (ref 135–145)
SODIUM: 137 mmol/L (ref 135–145)
Total Bilirubin: 0.8 mg/dL (ref 0.3–1.2)
Total Bilirubin: 0.9 mg/dL (ref 0.3–1.2)
Total Protein: 7.7 g/dL (ref 6.5–8.1)
Total Protein: 7.6 g/dL (ref 6.5–8.1)

## 2017-02-13 LAB — URINALYSIS, COMPLETE (UACMP) WITH MICROSCOPIC
Bilirubin Urine: NEGATIVE
Glucose, UA: NEGATIVE mg/dL
Ketones, ur: NEGATIVE mg/dL
Nitrite: POSITIVE — AB
PROTEIN: NEGATIVE mg/dL
SPECIFIC GRAVITY, URINE: 1.004 — AB (ref 1.005–1.030)
SQUAMOUS EPITHELIAL / LPF: NONE SEEN
pH: 5 (ref 5.0–8.0)

## 2017-02-13 LAB — CBC WITH DIFFERENTIAL/PLATELET
BASOS ABS: 0.1 10*3/uL (ref 0–0.1)
Basophils Relative: 1 %
EOS ABS: 0.1 10*3/uL (ref 0–0.7)
Eosinophils Relative: 1 %
HCT: 43.7 % (ref 40.0–52.0)
HEMOGLOBIN: 15.4 g/dL (ref 13.0–18.0)
LYMPHS ABS: 0.9 10*3/uL — AB (ref 1.0–3.6)
LYMPHS PCT: 14 %
MCH: 35.8 pg — AB (ref 26.0–34.0)
MCHC: 35.1 g/dL (ref 32.0–36.0)
MCV: 102 fL — AB (ref 80.0–100.0)
Monocytes Absolute: 0.6 10*3/uL (ref 0.2–1.0)
Monocytes Relative: 9 %
Neutro Abs: 5 10*3/uL (ref 1.4–6.5)
Neutrophils Relative %: 75 %
PLATELETS: 142 10*3/uL — AB (ref 150–440)
RBC: 4.29 MIL/uL — AB (ref 4.40–5.90)
RDW: 13.2 % (ref 11.5–14.5)
WBC: 6.6 10*3/uL (ref 3.8–10.6)

## 2017-02-13 LAB — CBC
HCT: 43.4 % (ref 40.0–52.0)
HEMOGLOBIN: 15.3 g/dL (ref 13.0–18.0)
MCH: 35.8 pg — AB (ref 26.0–34.0)
MCHC: 35.3 g/dL (ref 32.0–36.0)
MCV: 101.5 fL — AB (ref 80.0–100.0)
Platelets: 139 10*3/uL — ABNORMAL LOW (ref 150–440)
RBC: 4.27 MIL/uL — AB (ref 4.40–5.90)
RDW: 13.2 % (ref 11.5–14.5)
WBC: 9.2 10*3/uL (ref 3.8–10.6)

## 2017-02-13 LAB — LIPASE, BLOOD: Lipase: 24 U/L (ref 11–51)

## 2017-02-13 MED ORDER — OXYCODONE-ACETAMINOPHEN 5-325 MG PO TABS
1.0000 | ORAL_TABLET | ORAL | Status: AC
  Administered 2017-02-13: 1 via ORAL
  Filled 2017-02-13: qty 1

## 2017-02-13 MED ORDER — SULFAMETHOXAZOLE-TRIMETHOPRIM 800-160 MG PO TABS: 1.0000 | ORAL_TABLET | Freq: Two times a day (BID) | ORAL | 0 refills | Status: DC

## 2017-02-13 MED ORDER — OXYCODONE-ACETAMINOPHEN 5-325 MG PO TABS: 1.0000 | ORAL_TABLET | Freq: Four times a day (QID) | ORAL | 0 refills | Status: DC | PRN

## 2017-02-13 MED ORDER — OXYCODONE-ACETAMINOPHEN 5-325 MG PO TABS
2.0000 | ORAL_TABLET | Freq: Once | ORAL | Status: AC
  Administered 2017-02-13: 2 via ORAL
  Filled 2017-02-13: qty 2

## 2017-02-13 MED ORDER — SULFAMETHOXAZOLE-TRIMETHOPRIM 800-160 MG PO TABS
1.0000 | ORAL_TABLET | Freq: Once | ORAL | Status: AC
  Administered 2017-02-13: 1 via ORAL
  Filled 2017-02-13: qty 1

## 2017-02-13 NOTE — ED Provider Notes (Signed)
[facility] Emergency Department Provider Note ____________________________________________   I have reviewed the triage vital signs and the triage nursing note.  HISTORY  Chief Complaint Urinary Retention   Historian Patient  HPI Gerald Odonnell is a 61 y.o. male with a history of suprapubic catheter due to urinary retention and also history of prior colon resection, presents to the emergency department with problems with the superpubic catheter.  Patient states that this morning he felt like he had decreased output from the suprapubic catheter bag and that he was having some pain around the catheter which often times will indicate that he had a urinary tract infection.  He states that he took an extra oxybutynin this morning and then he did have some urine output which she was able to give to [name] here in the emergency department for evaluation.  Patient is also concerned that the catheter may be dislodged because he is able to push it in and it moves a little bit.  Pain is mild to moderate.  No fevers.  Appears that last suprapubic Foley change was in May at [facility].   Past Medical History:  Diagnosis Date  . Cancer ([place])    colon  . Hypertension   . S/P colon resection   . Shortness of breath dyspnea   . Urinary retention     Patient Active Problem List   Diagnosis Date Noted  . Sepsis ([place]) 09/10/2015  . Chest pain 09/10/2015  . Clostridium difficile diarrhea   . Colitis, infectious 04/23/2015  . Incomplete bladder emptying 09/10/2014  . Edema leg 09/15/2013  . Pelvic and perineal pain 09/15/2013  . Benign prostatic hyperplasia with urinary obstruction 07/22/2013    Past Surgical History:  Procedure Laterality Date  . ABDOMINAL SURGERY    . PROSTATE SURGERY  05/25/2016  . SUPRAPUBIC CATHETER INSERTION      Prior to Admission medications   Medication Sig Start Date End Date Taking? Authorizing Provider  acidophilus (RISAQUAD) CAPS capsule  Take 2 capsules by mouth daily. Patient not taking: Reported on 06/21/2015 04/29/15   Epifanio Lesches, MD  diphenhydramine-acetaminophen (TYLENOL PM) 25-500 MG TABS tablet Take 1 tablet by mouth at bedtime as needed (for sleep/pain).    [provider]  oxybutynin (DITROPAN) 5 MG tablet Take 1 tablet (5 mg total) by mouth 3 (three) times daily. Patient not taking: Reported on 06/06/2016 09/13/15   Epifanio Lesches, MD  oxyCODONE-acetaminophen (ROXICET) 5-325 MG tablet Take 1 tablet by mouth every 6 (six) hours as needed for severe pain. 02/13/17   Lisa Roca, MD  phenazopyridine (PYRIDIUM) 100 MG tablet Take 1 tablet (100 mg total) by mouth 3 (three) times daily as needed for pain. 09/21/16 09/21/17  Merlyn Lot, MD  sulfamethoxazole-trimethoprim (BACTRIM DS,SEPTRA DS) 800-160 MG tablet Take 1 tablet by mouth 2 (two) times daily. 02/13/17   Lisa Roca, MD  tamsulosin (FLOMAX) 0.4 MG CAPS capsule Take 1 capsule (0.4 mg total) by mouth daily. 04/29/15   Epifanio Lesches, MD    No Known Allergies  Family History  Problem Relation Age of Onset  . Hypertension Other     Social History Social History  Substance Use Topics  . Smoking status: Current Some Day Smoker    Packs/day: 0.50    Types: Cigarettes  . Smokeless tobacco: Never Used  . Alcohol use No    Review of Systems  Constitutional: Negative for fever. Eyes: Negative for visual changes. ENT: Negative for sore throat. Cardiovascular: Negative for chest  pain. Respiratory: Negative for shortness of breath. Gastrointestinal: Negative for vomiting and diarrhea. Genitourinary: Negative for hematuria. Musculoskeletal: Negative for back pain. Skin: Negative for rash. Neurological: Negative for headache.  ____________________________________________   PHYSICAL EXAM:  VITAL SIGNS: ED Triage Vitals  Enc Vitals Group     BP 02/13/17 0622 136/81     Pulse Rate 02/13/17 0622 (!) 106     Resp 02/13/17  0622 (!) 22     Temp 02/13/17 0622 98.3 F (36.8 C)     Temp Source 02/13/17 0622 Oral     SpO2 02/13/17 0622 96 %     Weight 02/13/17 0628 255 lb (115.7 kg)     Height 02/13/17 0628 5\' 10"  (1.778 m)     Head Circumference --      Peak Flow --      Pain Score 02/13/17 0621 10     Pain Loc --      Pain Edu? --      Excl. in [place]? --      Constitutional: Alert and oriented. Well appearing and in no distress.  Fairly anxious. HEENT   Head: Normocephalic and atraumatic.      Eyes: Conjunctivae are normal. Pupils equal and round.       Ears:         Nose: No congestion/rhinnorhea.   Mouth/Throat: Mucous membranes are moist.   Neck: No stridor. Cardiovascular/Chest: Normal rate, regular rhythm.  No murmurs, rubs, or gallops. Respiratory: Normal respiratory effort without tachypnea nor retractions. Breath sounds are clear and equal bilaterally. No wheezes/rales/rhonchi. Gastrointestinal: Soft. No distention, no guarding, no rebound.  Suprapubic catheter in place, skin surrounding looks normal.  Patient has mild tenderness to the suprapubic palpation. Genitourinary/rectal:normal external genitalia appearance Musculoskeletal: Nontender with normal range of motion in all extremities. No joint effusions.  No lower extremity tenderness.  No edema. Neurologic:  Normal speech and language. No gross or focal neurologic deficits are appreciated. Skin:  Skin is warm, dry and intact. No rash noted. Psychiatric: Mood and affect are normal. Speech and behavior are normal. Patient exhibits appropriate insight and judgment.   ____________________________________________  LABS (pertinent positives/negatives) I, Lisa Roca, MD the attending physician have reviewed the labs noted below.  Labs Reviewed  URINALYSIS, COMPLETE (UACMP) WITH MICROSCOPIC - Abnormal; Notable for the following:       Result Value   Color, Urine STRAW (*)    APPearance CLEAR (*)    Specific Gravity, Urine 1.004 (*)     Hgb urine dipstick LARGE (*)    Nitrite POSITIVE (*)    Leukocytes, UA SMALL (*)    Bacteria, UA FEW (*)    All other components within normal limits  CBC WITH DIFFERENTIAL/PLATELET - Abnormal; Notable for the following:    RBC 4.29 (*)    MCV 102.0 (*)    MCH 35.8 (*)    Platelets 142 (*)    Lymphs Abs 0.9 (*)    All other components within normal limits  COMPREHENSIVE METABOLIC PANEL - Abnormal; Notable for the following:    Glucose, Bld 118 (*)    AST 42 (*)    All other components within normal limits  URINE CULTURE    ____________________________________________    EKG I, Lisa Roca, MD, the attending physician have personally viewed and interpreted all ECGs.  None ____________________________________________  RADIOLOGY All Xrays were viewed by me.  Imaging interpreted by Radiologist, and I, Lisa Roca, MD the attending physician have reviewed the radiologist interpretation  noted below.  None __________________________________________  PROCEDURES  Procedure(s) performed: None  Critical Care performed: None  ____________________________________________  No current facility-administered medications on file prior to encounter.    Current Outpatient Prescriptions on File Prior to Encounter  Medication Sig Dispense Refill  . acidophilus (RISAQUAD) CAPS capsule Take 2 capsules by mouth daily. (Patient not taking: Reported on 06/21/2015) 30 capsule 0  . diphenhydramine-acetaminophen (TYLENOL PM) 25-500 MG TABS tablet Take 1 tablet by mouth at bedtime as needed (for sleep/pain).    Marland Kitchen oxybutynin (DITROPAN) 5 MG tablet Take 1 tablet (5 mg total) by mouth 3 (three) times daily. (Patient not taking: Reported on 06/06/2016) 15 tablet 0  . phenazopyridine (PYRIDIUM) 100 MG tablet Take 1 tablet (100 mg total) by mouth 3 (three) times daily as needed for pain. 20 tablet 0  . tamsulosin (FLOMAX) 0.4 MG CAPS capsule Take 1 capsule (0.4 mg total) by mouth daily. 30 capsule  0    ____________________________________________  ED COURSE / ASSESSMENT AND PLAN  Pertinent labs & imaging results that were available during my care of the patient were reviewed by me and considered in my medical decision making (see chart for details).   Patient is very anxious about catheter malfunction or dysfunction.  His initial complaint to me was that the catheter was able to be moved or pushed in.  I did describe to him and explain the bulb balloon on one end and that the catheter would have some laxity in terms of moving further into the bladder.  Visualization of the urine sample is clear, sending for UA.  Patient reports decreased urine output this morning, followed by urine output here in the emergency department through the catheter bag.  Bedside sono 300, initially planned to consider replacing catheter, but patient is having flow and able to urinate here.  UA is consistent with uti, culture sent.  Based on prior culture, patient is being started on Bactrim.  He does not seem to have evidence of sepsis.  He is okay for outpatient management.  Patient received 1 Percocet and reports complete relief of symptoms.  I did review narcotics database, and patient will be prescribed total number of 5 tablets DIFFERENTIAL DIAGNOSIS: Differential diagnosis includes, but is not limited to, suprapubic catheter dysfunction/obstruction, acute appendicitis, renal colic, testicular torsion, urinary tract infection/pyelonephritis, prostatitis,  epididymitis, diverticulitis, small bowel obstruction or ileus, colitis, abdominal aortic aneurysm, gastroenteritis, hernia, etc.   CONSULTATIONS:  None   Patient / Family / Caregiver informed of clinical course, medical decision-making process, and agree with plan.   I discussed return precautions, follow-up instructions, and discharge instructions with patient and/or family.  Discharge Instructions : You are being treated for urinary tract  infection with antibiotic called Bactrim.  Please complete entire course.  Return to the emergency room immediately for any worsening condition including uncontrolled pain, unable to urinate including no flow to your bag from the suprapubic catheter, fever, confusion or altered mental status, vomiting, or any other symptoms concerning to you.  ___________________________________________   FINAL CLINICAL IMPRESSION(S) / ED DIAGNOSES   Final diagnoses:  Urinary tract infection with hematuria, site unspecified              Note: This dictation was prepared with Dragon dictation. Any transcriptional errors that result from this process are unintentional    Lisa Roca, MD 02/13/17 [phone]

## 2017-02-13 NOTE — ED Provider Notes (Signed)
[facility] Emergency Department Provider Note   ____________________________________________   First MD Initiated Contact with Patient 02/13/17 2030     (approximate)  I have reviewed the triage vital signs and the nursing notes.   HISTORY  Chief Complaint suprapubic catheter problem    HPI Gerald Odonnell is a 61 y.o. male reports his been having pain and difficulty with urination for about a day.  Reports he has significant urge to urinate but is only urinating small amounts.  Believes his super pubic catheter may be partially blocked and he is not seen much output for the last 24 hours, reports he is only seen about 50 cc in the last 8 hours.  He was here in the ER earlier, but reports he is having ongoing discomfort and feels the ongoing need to urinate and feels like his bladder is obstructed.  No fevers or chills.  No nausea or vomiting.  Denies abdominal pain except for over the "bladder".  He had a suprapubic catheter for a very long time and follows with [facility] urology  Started his antibiotic prescription this evening.  Past Medical History:  Diagnosis Date  . Cancer ([place])    colon  . Hypertension   . S/P colon resection   . Shortness of breath dyspnea   . Urinary retention     Patient Active Problem List   Diagnosis Date Noted  . Sepsis ([place]) 09/10/2015  . Chest pain 09/10/2015  . Clostridium difficile diarrhea   . Colitis, infectious 04/23/2015  . Incomplete bladder emptying 09/10/2014  . Edema leg 09/15/2013  . Pelvic and perineal pain 09/15/2013  . Benign prostatic hyperplasia with urinary obstruction 07/22/2013    Past Surgical History:  Procedure Laterality Date  . ABDOMINAL SURGERY    . PROSTATE SURGERY  05/25/2016  . SUPRAPUBIC CATHETER INSERTION      Prior to Admission medications   Medication Sig Start Date End Date Taking? Authorizing Provider  acidophilus (RISAQUAD) CAPS capsule Take 2 capsules by mouth  daily. Patient not taking: Reported on 06/21/2015 04/29/15   Epifanio Lesches, MD  diphenhydramine-acetaminophen (TYLENOL PM) 25-500 MG TABS tablet Take 1 tablet by mouth at bedtime as needed (for sleep/pain).    [provider]  oxybutynin (DITROPAN) 5 MG tablet Take 1 tablet (5 mg total) by mouth 3 (three) times daily. Patient not taking: Reported on 06/06/2016 09/13/15   Epifanio Lesches, MD  oxyCODONE-acetaminophen (ROXICET) 5-325 MG tablet Take 1 tablet by mouth every 6 (six) hours as needed for severe pain. 02/13/17   Lisa Roca, MD  phenazopyridine (PYRIDIUM) 100 MG tablet Take 1 tablet (100 mg total) by mouth 3 (three) times daily as needed for pain. 09/21/16 09/21/17  Merlyn Lot, MD  sulfamethoxazole-trimethoprim (BACTRIM DS,SEPTRA DS) 800-160 MG tablet Take 1 tablet by mouth 2 (two) times daily. 02/13/17   Lisa Roca, MD  tamsulosin (FLOMAX) 0.4 MG CAPS capsule Take 1 capsule (0.4 mg total) by mouth daily. 04/29/15   Epifanio Lesches, MD    Allergies Patient has no known allergies.  Family History  Problem Relation Age of Onset  . Hypertension Other     Social History Social History  Substance Use Topics  . Smoking status: Current Some Day Smoker    Packs/day: 0.50    Types: Cigarettes  . Smokeless tobacco: Never Used  . Alcohol use No    Review of Systems Constitutional: No fever/chills Eyes: No visual changes. ENT: No sore throat. Cardiovascular: Denies chest pain. Respiratory:  Denies shortness of breath. Gastrointestinal: No abdominal pain except over his "bladder".  No nausea, no vomiting.  No diarrhea.  No constipation. Genitourinary: See HPI musculoskeletal: Negative for back pain. Skin: Negative for rash. Neurological: Negative for headaches, focal weakness or numbness.    ____________________________________________   PHYSICAL EXAM:  VITAL SIGNS: ED Triage Vitals  Enc Vitals Group     BP 02/13/17 1723 (!) 140/105     Pulse  Rate 02/13/17 1723 (!) 111     Resp 02/13/17 1723 (!) 22     Temp 02/13/17 1723 98.9 F (37.2 C)     Temp Source 02/13/17 1723 Oral     SpO2 02/13/17 1723 96 %     Weight 02/13/17 1723 255 lb (115.7 kg)     Height 02/13/17 1723 5\' 10"  (1.778 m)     Head Circumference --      Peak Flow --      Pain Score 02/13/17 2014 10     Pain Loc --      Pain Edu? --      Excl. in [place]? --     Constitutional: Alert and oriented. Well appearing and in no acute distress.  He is standing in the room, holding hands over his lower abdomen reporting his bladder feels blocked.  He looks uncomfortable. Eyes: Conjunctivae are normal. Head: Atraumatic. Nose: No congestion/rhinnorhea. Mouth/Throat: Mucous membranes are moist. Neck: No stridor.   Cardiovascular: Normal rate, regular rhythm. Grossly normal heart sounds.  Good peripheral circulation. Respiratory: Normal respiratory effort.  No retractions. Lungs CTAB. Gastrointestinal: Soft and nontender except for moderate tenderness suprapubically with a minimally distended bladder palpable.  Suprapubic catheter appears to be in place, no drainage noted about it.  His urine bag has about 50 cc of urine in it which is relatively dark.  He is able to urinate slightly through his urethra, and there is appears nonswollen Scrotum nontender.  No scrotal edema or swelling.  No erythema. Musculoskeletal: No lower extremity tenderness nor edema. Neurologic:  Normal speech and language. No gross focal neurologic deficits are appreciated.  Skin:  Skin is warm, dry and intact. No rash noted. Psychiatric: Mood and affect are normal. Speech and behavior are normal.  ____________________________________________   LABS (all labs ordered are listed, but only abnormal results are displayed)  Labs Reviewed  CBC - Abnormal; Notable for the following:       Result Value   RBC 4.27 (*)    MCV 101.5 (*)    MCH 35.8 (*)    Platelets 139 (*)    All other components within  normal limits  COMPREHENSIVE METABOLIC PANEL - Abnormal; Notable for the following:    Glucose, Bld 102 (*)    All other components within normal limits  LIPASE, BLOOD   ____________________________________________  EKG   ____________________________________________  RADIOLOGY  Ct Renal Stone Study  Result Date: 02/13/2017 CLINICAL DATA:  Initial evaluation for acute lower abdominal pain. Increased urinary frequency. Suprapubic catheter in place. EXAM: CT ABDOMEN AND PELVIS WITHOUT CONTRAST TECHNIQUE: Multidetector CT imaging of the abdomen and pelvis was performed following the standard protocol without IV contrast. COMPARISON:  Prior head CT from 09/10/2015. FINDINGS: Lower chest: Visualized lung bases are clear. Hepatobiliary: Liver demonstrates a somewhat nodular contour, which may reflect underlying cirrhosis. Limited noncontrast evaluation liver is otherwise unremarkable. Gallbladder within normal limits. No biliary dilatation. Pancreas: Pancreas within normal limits. Spleen: Spleen within normal limits. Adrenals/Urinary Tract: Right adrenal adenoma noted, stable. Adrenals are otherwise  unremarkable. Kidneys equal in size without evidence for nephrolithiasis or hydronephrosis. No radiopaque calculi seen along the course of either renal collecting system. No hydroureter. Suprapubic catheter in place within the bladder. Catheter appears appropriately positioned. There is mild circumferential wall thickening with hazy stranding about the bladder itself, suggesting possible acute cystitis. Scattered layering bladder calculi present within the bladder lumen, largest of which measures 14 mm. Stomach/Bowel: Stomach within normal limits. No evidence for bowel obstruction. Mild colonic diverticulosis without evidence for acute diverticulitis. No acute inflammatory changes seen about the bowels. Vascular/Lymphatic: Intra-abdominal aorta of normal caliber. Mild aortic atherosclerosis. Few scattered  porta hepatis nodes measure up to 16 mm, indeterminate. No other adenopathy within the abdomen and pelvis. Reproductive: Brachia therapy sees present within the prostate. Other: Bilateral fat containing inguinal hernias noted, larger on the right. No free air or fluid. Musculoskeletal: No acute osseus abnormality. Scattered predominantly subcentimeter sclerotic foci seen throughout the osseous structures, progressed relative to most recent CT from 09/10/2015. Findings suspected to reflect sequelae of metastatic prostate disease. IMPRESSION: 1. Suprapubic catheter in appropriate position within the bladder. Mild wall thickening with hazy stranding about the bladder may be related to catheterization or possibly acute cystitis. Correlation with urinalysis recommended. 2. Multiple superimposed layering bladder calculi as above. 3. Innumerable predominantly subcentimeter sclerotic foci within the visualized osseous structures, suspected to reflect sequelae of metastatic prostate cancer. These are increased relative to most recent CT from 2017. 4. Mild nodularity of the hepatic contour, suggesting possible cirrhosis. Correlation with LFTs suggested. 5. Enlarged porta hepatis adenopathy as above, indeterminate, but may be related underlying intrinsic liver disease. Electronically Signed   By: Jeannine Boga M.D.   On: 02/13/2017 22:01   CT reviewed by me, catheter appears to be in position but findings potentially demonstrating acute cystitis are noted.  Multiple bladder stones.  Sclerotic foci within bony regions are also noted, patient does report a history of treated prostate cancer.  He also follows actively with [facility] urology  ____________________________________________   PROCEDURES  Procedure(s) performed: None  Procedures  Critical Care performed: No  ____________________________________________   INITIAL IMPRESSION / ASSESSMENT AND PLAN / ED COURSE  Pertinent labs & imaging results that  were available during my care of the patient were reviewed by me and considered in my medical decision making (see chart for details).  Patient presents for difficulty with urination.  Found previously to have evidence of possible urinary tract infection.  CT is consistent with clinical exam which demonstrates a likely urinary tract infection/cystitis.  Suspect this may be causing discomfort and spasm, in addition after CT scan we were able to flush the patient's suprapubic catheter which was partially blocked, after being flushed it he drained approximately 500 cc of urine which was yellow to clear his bag and he reports his symptoms and pain essentially resolved.  He appears much more comfortable now, and I suspect he likely had acute cystitis causing a mild associated obstruction possibly due to urinary sediment in his bag.  This appears to be cleared now.  Return precautions and treatment recommendations and follow-up discussed with the patient who is agreeable with the plan.  No signs or symptoms of sepsis.  Patient's wife is driving him home.  He will be following closely with [facility] urology, and careful return precautions were discussed.      ____________________________________________   FINAL CLINICAL IMPRESSION(S) / ED DIAGNOSES  Final diagnoses:  Lower urinary tract infection, acute      NEW MEDICATIONS  STARTED DURING THIS VISIT:  New Prescriptions   No medications on file     Note:  This document was prepared using Dragon voice recognition software and may include unintentional dictation errors.     Delman Kitten, MD 02/14/17 0001

## 2017-02-13 NOTE — ED Triage Notes (Signed)
Pt was seen in ED earlier today. Reports having issues with suprapubic catheter, reports does not feel like it is secure and blood coming from tube.

## 2017-02-13 NOTE — Discharge Instructions (Signed)
You are being treated for urinary tract infection with antibiotic called Bactrim.  Please complete entire course.  Return to the emergency room immediately for any worsening condition including uncontrolled pain, unable to urinate including no flow to your bag from the suprapubic catheter, fever, confusion or altered mental status, vomiting, or any other symptoms concerning to you.

## 2017-02-13 NOTE — ED Triage Notes (Signed)
Patient to stat desk in no acute distress. Patient asking about wait time. Patient given update on wait time.

## 2017-02-13 NOTE — ED Triage Notes (Signed)
Pt states that over the course of night his pain around his catheter has increased. Pt reports having suprapubic catheter x 4 years.

## 2017-02-13 NOTE — ED Notes (Signed)
Bladder scan 301 ml.

## 2017-02-13 NOTE — Discharge Instructions (Signed)
You have been seen in the Emergency Department (ED) today for pain when urinating.  Your workup today suggests that you have a urinary tract infection (UTI). Please continue the antibiotic you were prescribed today.  Call your urology doctor to schedule the next available appointment to follow up on today?s ED visit, or return immediately to the ED if your pain worsens, you have decreased urine production, develop fever, persistent vomiting, or other symptoms that concern you.

## 2017-02-13 NOTE — ED Notes (Signed)
Electronic signature pad not working at this time. Patient verbalizes understanding of follow up, discharge instructions and prescriptions.

## 2017-02-16 LAB — URINE CULTURE
Culture: >=100000 — AB
Special Requests: NORMAL

## 2017-02-17 NOTE — Progress Notes (Signed)
Pt called and informed that his current abx would not cover the bacteria that grew in his urine cx. Pt states that he isnt feeling any better. Told pt to stop taking bactrim and start taking new abx. Amoxicillin 500mg  BID x 10 days Called into [facility] in [place] by Dr. Jimmye Norman  Recent Results (from the past 240 hour(s))  Urine Culture     Status: Abnormal   Collection Time: 02/13/17  6:26 AM  Result Value Ref Range Status   Specimen Description URINE, SUPRAPUBIC  Final   Special Requests Normal  Final   Culture (A)  Final    >=100,000 COLONIES/mL ENTEROCOCCUS FAECALIS >=100,000 COLONIES/mL DIPHTHEROIDS(CORYNEBACTERIUM SPECIES) Standardized susceptibility testing for this organism is not available. Performed at [facility], [place] [address], [place], [place] [address]    Report Status 02/16/2017 FINAL  Final   Organism ID, Bacteria ENTEROCOCCUS FAECALIS (A)  Final      Susceptibility   Enterococcus faecalis - MIC*    AMPICILLIN <=2 SENSITIVE Sensitive     LEVOFLOXACIN 2 SENSITIVE Sensitive     NITROFURANTOIN <=16 SENSITIVE Sensitive     VANCOMYCIN 2 SENSITIVE Sensitive     * >=100,000 COLONIES/mL ENTEROCOCCUS FAECALIS     Malayia Spizzirri D Alexcia Schools, Pharm.D, BCPS Clinical Pharmacist

## 2017-03-15 ENCOUNTER — Encounter: Payer: Self-pay | Admitting: Urology

## 2017-03-15 ENCOUNTER — Ambulatory Visit: Payer: Medicare HMO | Admitting: Urology

## 2017-03-15 VITALS — BP 129/73 | HR 109

## 2017-03-15 DIAGNOSIS — C61 Malignant neoplasm of prostate: Secondary | ICD-10-CM | POA: Diagnosis not present

## 2017-03-15 DIAGNOSIS — R339 Retention of urine, unspecified: Secondary | ICD-10-CM | POA: Insufficient documentation

## 2017-03-15 MED ORDER — OXYBUTYNIN CHLORIDE 5 MG PO TABS: 5.0000 mg | ORAL_TABLET | Freq: Three times a day (TID) | ORAL | 0 refills | Status: DC

## 2017-03-15 NOTE — Progress Notes (Signed)
03/15/2017 10:24 AM   Gerald Odonnell 10/18/1954 [id number]  Referring provider: [facility] [place] [address] [place], [place] [address]  Chief Complaint  Patient presents with  . Urinary Retention    New Patient    HPI: Gerald Odonnell is a 61 y.o. malewith high risk/locally-advancedprostate cancer:PSA 8.23, cT2c, Gleason 5+4 with 12/12 cores positive. Patient has pT3a disease based on biopsy showing extraprostatic extension.  He was treated by radiation oncology with external beam/brachytherapy and is presently on Lupron.  He has chronic urinary retention.  He is unable to perform intermittent catheterization secondary to pain and has a chronic indwelling suprapubic tube.  Last cystoscopy showed no occlusive prostate tissue and prostate volume was 23 cc on prostate ultrasound.  He has intermittent bladder spasms.  His suprapubic tube has not been changed in approximately 2 months.  I last saw him in July 2018 and he periodically clamps his SP tube and is able to void for up to 5 hours before he has recurrent retention.   PMH: Past Medical History:  Diagnosis Date  . Cancer ([place])    colon  . Hypertension   . S/P colon resection   . Shortness of breath dyspnea   . Urinary retention     Surgical History: Past Surgical History:  Procedure Laterality Date  . ABDOMINAL SURGERY    . PROSTATE SURGERY  05/25/2016  . SUPRAPUBIC CATHETER INSERTION      Home Medications:  Allergies as of 03/15/2017   No Known Allergies     Medication List        Accurate as of 03/15/17 10:24 AM. Always use your most recent med list.          acidophilus Caps capsule Take 2 capsules by mouth daily.   baclofen 10 MG tablet Commonly known as:  LIORESAL Take 10 mg 3 (three) times daily by mouth.   carvedilol 3.125 MG tablet Commonly known as:  COREG Take 3.125 mg 3 (three) times daily by mouth.   diphenhydramine-acetaminophen 25-500 MG Tabs tablet Commonly known as:   TYLENOL PM Take 1 tablet by mouth at bedtime as needed (for sleep/pain).   oxybutynin 5 MG tablet Commonly known as:  DITROPAN Take 1 tablet (5 mg total) by mouth 3 (three) times daily.   oxybutynin 10 MG 24 hr tablet Commonly known as:  DITROPAN-XL Take 10 mg 3 (three) times daily by mouth.   oxyCODONE-acetaminophen 5-325 MG tablet Commonly known as:  ROXICET Take 1 tablet by mouth every 6 (six) hours as needed for severe pain.   phenazopyridine 100 MG tablet Commonly known as:  PYRIDIUM Take 1 tablet (100 mg total) by mouth 3 (three) times daily as needed for pain.   sulfamethoxazole-trimethoprim 800-160 MG tablet Commonly known as:  BACTRIM DS,SEPTRA DS Take 1 tablet by mouth 2 (two) times daily.   tamsulosin 0.4 MG Caps capsule Commonly known as:  FLOMAX Take 1 capsule (0.4 mg total) by mouth daily.       Allergies: No Known Allergies  Family History: Family History  Problem Relation Age of Onset  . Hypertension Other     Social History:  reports that he has been smoking cigarettes.  He has been smoking about 0.50 packs per day. he has never used smokeless tobacco. He reports that he does not drink alcohol or use drugs.  ROS: UROLOGY Frequent Urination?: No Hard to postpone urination?: Yes Burning/pain with urination?: Yes Get up at night to urinate?: Yes Leakage of  urine?: No Urine stream starts and stops?: No Trouble starting stream?: Yes Do you have to strain to urinate?: Yes Blood in urine?: No Urinary tract infection?: Yes Sexually transmitted disease?: No Injury to kidneys or bladder?: No Painful intercourse?: No Weak stream?: No Erection problems?: No Penile pain?: Yes  Gastrointestinal Nausea?: No Vomiting?: No Indigestion/heartburn?: No Diarrhea?: No Constipation?: No  Constitutional Fever: Yes Night sweats?: Yes Weight loss?: Yes Fatigue?: No  Skin Skin rash/lesions?: No Itching?: No  Eyes Blurred vision?: Yes Double vision?:  Yes  Ears/Nose/Throat Sore throat?: No Sinus problems?: No  Hematologic/Lymphatic Swollen glands?: No Easy bruising?: No  Cardiovascular Leg swelling?: Yes Chest pain?: Yes  Respiratory Cough?: No Shortness of breath?: Yes  Endocrine Excessive thirst?: Yes  Musculoskeletal Back pain?: Yes Joint pain?: Yes  Neurological Headaches?: Yes Dizziness?: Yes  Psychologic Depression?: No Anxiety?: No  Physical Exam: BP 129/73   Pulse (!) 109   Constitutional:  Alert and oriented, No acute distress. HEENT: [place] AT, moist mucus membranes.  Trachea midline, no masses. Cardiovascular: No clubbing, cyanosis, or edema. Respiratory: Normal respiratory effort, no increased work of breathing. GI: Abdomen is soft, nontender, nondistended, no abdominal masses GU: No CVA tenderness.  Indwelling SP tube. Skin: No rashes, bruises or suspicious lesions. Lymph: No cervical or inguinal adenopathy. Neurologic: Grossly intact, no focal deficits, moving all 4 extremities. Psychiatric: Normal mood and affect.  Laboratory Data: Lab Results  Component Value Date   WBC 9.2 02/13/2017   HGB 15.3 02/13/2017   HCT 43.4 02/13/2017   MCV 101.5 (H) 02/13/2017   PLT 139 (L) 02/13/2017    Lab Results  Component Value Date   CREATININE 1.05 02/13/2017     Assessment & Plan:   1.  Chronic urinary retention He has discomfort and spasms with his SP tube however states he cannot perform intermittent catheterization.  Based on prior evaluation I again discussed that TURP is unlikely to be beneficial.  Follow-up 1 month for SP tube change.  Return in about 4 weeks (around 04/12/2017), or nurse visit; sp tube change.   2.  High risk prostate cancer Continue follow-up with radiation oncology at [facility].     Abbie Sons, [place] [address] [place], [place] [address] [phone]

## 2017-03-15 NOTE — Progress Notes (Signed)
Suprapubic Cath Change  Patient is present today for a suprapubic catheter change due to urinary retention.  3m of water was drained from the balloon, a 16 FR foley cath was attempted to be removed from the tract resistance was met and Dr.Stoioff was called into assist . Some incrustation and blood was noted.  Site was cleaned and prepped in a sterile fashion with betadine.  A 16FR foley cath was replaced into the tract complications were noted as: some blood and granulation tissue noted at site.. Urine return was noted, 10 ml of sterile water was inflated into the balloon and a leg bag was attached for drainage.  Patient tolerated well. A night bag was given to patient and proper instruction was given on how to switch bags.    Preformed by: SFonnie Jarvis CMA  Follow up: 1 month

## 2017-04-06 NOTE — Progress Notes (Deleted)
Gerald Odonnell  [phone] [phone]  ID: Gerald Odonnell OB: 10/18/1954  MR#: [id number]  GQQ#:[id number]  Patient Care Team: [facility], [facility] [place] [facility] as PCP - General (Pulmonary Disease)  CHIEF COMPLAINT: ***  INTERVAL HISTORY: ***  REVIEW OF SYSTEMS:   ROS  As per HPI. Otherwise, a complete review of systems is negative.  PAST MEDICAL HISTORY: Past Medical History:  Diagnosis Date  . Cancer ([place])    colon  . Hypertension   . S/P colon resection   . Shortness of breath dyspnea   . Urinary retention     PAST SURGICAL HISTORY: Past Surgical History:  Procedure Laterality Date  . ABDOMINAL SURGERY    . PROSTATE SURGERY  05/25/2016  . SUPRAPUBIC CATHETER INSERTION      FAMILY HISTORY: Family History  Problem Relation Age of Onset  . Hypertension Other     ADVANCED DIRECTIVES (Y/N):  N  HEALTH MAINTENANCE: Social History   Tobacco Use  . Smoking status: Current Some Day Smoker    Packs/day: 0.50    Types: Cigarettes  . Smokeless tobacco: Never Used  Substance Use Topics  . Alcohol use: No  . Drug use: No     Colonoscopy:  PAP:  Bone density:  Lipid panel:  No Known Allergies  Current Outpatient Medications  Medication Sig Dispense Refill  . acidophilus (RISAQUAD) CAPS capsule Take 2 capsules by mouth daily. 30 capsule 0  . baclofen (LIORESAL) 10 MG tablet Take 10 mg 3 (three) times daily by mouth.    . carvedilol (COREG) 3.125 MG tablet Take 3.125 mg 3 (three) times daily by mouth.    . diphenhydramine-acetaminophen (TYLENOL PM) 25-500 MG TABS tablet Take 1 tablet by mouth at bedtime as needed (for sleep/pain).    Marland Kitchen oxybutynin (DITROPAN) 5 MG tablet Take 1 tablet (5 mg total) 3 (three) times daily by mouth. 90 tablet 0  . phenazopyridine (PYRIDIUM) 100 MG tablet Take 1 tablet (100 mg total) by mouth 3 (three) times daily as needed for pain. 20 tablet 0  . tamsulosin (FLOMAX) 0.4 MG CAPS capsule Take 1  capsule (0.4 mg total) by mouth daily. 30 capsule 0   No current facility-administered medications for this visit.     OBJECTIVE: There were no vitals filed for this visit.   There is no height or weight on file to calculate BMI.    ECOG FS:{CHL ONC [id number]  General: Well-developed, well-nourished, no acute distress. Eyes: Pink conjunctiva, anicteric sclera. HEENT: Normocephalic, moist mucous membranes, clear oropharnyx. Lungs: Clear to auscultation bilaterally. Heart: Regular rate and rhythm. No rubs, murmurs, or gallops. Abdomen: Soft, nontender, nondistended. No organomegaly noted, normoactive bowel sounds. Musculoskeletal: No edema, cyanosis, or clubbing. Neuro: Alert, answering all questions appropriately. Cranial nerves grossly intact. Skin: No rashes or petechiae noted. Psych: Normal affect. Lymphatics: No cervical, calvicular, axillary or inguinal LAD.   LAB RESULTS:  Lab Results  Component Value Date   NA 137 02/13/2017   K 3.7 02/13/2017   CL 102 02/13/2017   CO2 26 02/13/2017   GLUCOSE 102 (H) 02/13/2017   BUN 14 02/13/2017   CREATININE 1.05 02/13/2017   CALCIUM 9.4 02/13/2017   PROT 7.6 02/13/2017   ALBUMIN 4.1 02/13/2017   AST 41 02/13/2017   ALT 27 02/13/2017   ALKPHOS 103 02/13/2017   BILITOT 0.9 02/13/2017   GFRNONAA >60 02/13/2017   GFRAA >60 02/13/2017    Lab Results  Component Value Date   WBC 9.2 02/13/2017  NEUTROABS 5.0 02/13/2017   HGB 15.3 02/13/2017   HCT 43.4 02/13/2017   MCV 101.5 (H) 02/13/2017   PLT 139 (L) 02/13/2017     STUDIES: No results found.  ASSESSMENT: Abnormal ferritin levels  PLAN:    Patient expressed understanding and was in agreement with this plan. He also understands that He can call clinic at any time with any questions, concerns, or complaints.   Cancer Staging No matching staging information was found for the patient.  Gerald Huger, MD   04/06/2017 10:31 PM

## 2017-04-07 ENCOUNTER — Inpatient Hospital Stay: Payer: Medicare HMO | Admitting: Oncology

## 2017-04-14 ENCOUNTER — Ambulatory Visit (INDEPENDENT_AMBULATORY_CARE_PROVIDER_SITE_OTHER): Payer: Medicare HMO

## 2017-04-14 VITALS — BP 126/83 | HR 89 | Ht 70.0 in | Wt 255.0 lb

## 2017-04-14 DIAGNOSIS — R339 Retention of urine, unspecified: Secondary | ICD-10-CM

## 2017-04-14 MED ORDER — OXYBUTYNIN CHLORIDE 5 MG PO TABS: 5.0000 mg | ORAL_TABLET | Freq: Two times a day (BID) | ORAL | 3 refills | Status: DC

## 2017-04-14 NOTE — Addendum Note (Signed)
Addended by: Toniann Fail C on: 04/14/2017 02:55 PM   Modules accepted: Orders

## 2017-04-14 NOTE — Progress Notes (Signed)
Suprapubic Cath Change  Patient is present today for a suprapubic catheter change due to urinary retention.  29ml of water was drained from the balloon, a 16FR foley cath was removed from the tract with out difficulty.  Site was cleaned and prepped in a sterile fashion with betadine.  A 16FR foley cath was replaced into the tract no complications were noted. Urine return was noted, 10 ml of sterile water was inflated into the balloon and a leg bag was attached for drainage.  Patient tolerated well. A night bag was given to patient and proper instruction was given on how to switch bags.    Preformed by: Toniann Fail, LPN   Follow up: 1 month  Blood pressure 126/83, pulse 89, height 5\' 10"  (1.778 m), weight 255 lb (115.7 kg).

## 2017-04-28 ENCOUNTER — Other Ambulatory Visit: Payer: Self-pay | Admitting: Internal Medicine

## 2017-05-18 ENCOUNTER — Encounter: Payer: Self-pay | Admitting: Urology

## 2017-05-18 ENCOUNTER — Ambulatory Visit: Payer: Medicare HMO | Admitting: Urology

## 2017-05-18 ENCOUNTER — Other Ambulatory Visit: Payer: Self-pay | Admitting: Radiology

## 2017-05-18 VITALS — BP 134/80 | HR 101 | Ht 70.0 in | Wt 239.4 lb

## 2017-05-18 DIAGNOSIS — C61 Malignant neoplasm of prostate: Secondary | ICD-10-CM

## 2017-05-18 DIAGNOSIS — R339 Retention of urine, unspecified: Secondary | ICD-10-CM | POA: Diagnosis not present

## 2017-05-18 DIAGNOSIS — N210 Calculus in bladder: Secondary | ICD-10-CM | POA: Diagnosis not present

## 2017-05-18 NOTE — Progress Notes (Signed)
Suprapubic Cath Change  Patient is present today for a suprapubic catheter change due to urinary retention.  10ml of water was drained from the balloon, a 16FR foley cath was removed from the tract with out difficulty.  Site was cleaned and prepped in a sterile fashion with betadine.  A 16FR foley cath was replaced into the tract no complications were noted. Urine return was noted, 10 ml of sterile water was inflated into the balloon and a leg bag was attached for drainage.  Patient tolerated well. A night bag was given to patient and proper instruction was given on how to switch bags.    Preformed by: Dynastie Knoop, CMA  Follow up: 1 month 

## 2017-05-18 NOTE — Progress Notes (Signed)
05/18/2017 8:40 AM   Gerald Odonnell 10/18/1954 [id number]  Referring provider: [facility], [address] [place], [place] [address]  Chief Complaint  Patient presents with  . Urinary Retention    HPI: 62 y.o. followed by Dr. Bernardo Heater who presents today for second opinion regarding ongoing urinary retention..  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  He was treated by radiation oncology with EB RT with brachy boost as well as Lupron.  Trus volume 23 cc.  Most recent PSA on 07/2016 1.17.  He continues to be followed by radiation oncology at [facility] and remains on Lupron, last injection 12/2016.  He is been a chronic urinary retention managed with an indwelling suprapubic tube since that time.  In 2015, he underwent urodynamics which showed evidence of outlet obstruction.  He was taken to the operating room by Dr. Bernardo Heater for PVP.  He voided a few times after that but then developed recurrent retention.  He is unable to self cath or tolerate an indwelling penile Foley.  He is been managed with a suprapubic tube.  Under the care of Dr. Bernardo Heater, he intermittently clamped the suprapubic tube and is able to void spontaneously for up to 5 hours before developing recurrent retention.  He reports today that he is very distraught about having a suprapubic tube.  He wishes to feel like a "man again" and void spontaneously through his penis.  Of note, the patient does have multiple bladder stones measuring up to 14 mm in his bladder.  He has had multiple ER visits for nondraining suprapubic tube, pelvic pain, and bladder spasms.   PMH: Past Medical History:  Diagnosis Date  . Cancer ([place])    colon  . Hypertension   . S/P colon resection   . Shortness of breath dyspnea   . Urinary retention     Surgical History: Past Surgical History:  Procedure Laterality Date  . ABDOMINAL SURGERY    .  PROSTATE SURGERY  05/25/2016  . SUPRAPUBIC CATHETER INSERTION      Home Medications:  Allergies as of 05/18/2017   No Known Allergies     Medication List        Accurate as of 05/18/17  8:40 AM. Always use your most recent med list.          acidophilus Caps capsule Take 2 capsules by mouth daily.   baclofen 10 MG tablet Commonly known as:  LIORESAL Take 10 mg 3 (three) times daily by mouth.   carvedilol 3.125 MG tablet Commonly known as:  COREG Take 3.125 mg 3 (three) times daily by mouth.   diphenhydramine-acetaminophen 25-500 MG Tabs tablet Commonly known as:  TYLENOL PM Take 1 tablet by mouth at bedtime as needed (for sleep/pain).   oxybutynin 5 MG tablet Commonly known as:  DITROPAN Take 1 tablet (5 mg total) 3 (three) times daily by mouth.   oxybutynin 5 MG tablet Commonly known as:  DITROPAN Take 1 tablet (5 mg total) by mouth 2 (two) times daily.   phenazopyridine 100 MG tablet Commonly known as:  PYRIDIUM Take 1 tablet (100 mg total) by mouth 3 (three) times daily as needed for pain.   tamsulosin 0.4 MG Caps capsule Commonly known as:  FLOMAX TAKE 1 CAPSULE EVERY DAY       Allergies: No Known Allergies  Family History: Family History  Problem Relation Age of Onset  . Hypertension  Other     Social History:  reports that he has been smoking cigarettes.  He has been smoking about 0.50 packs per day. he has never used smokeless tobacco. He reports that he does not drink alcohol or use drugs.  ROS: UROLOGY Frequent Urination?: No Hard to postpone urination?: No Burning/pain with urination?: No Get up at night to urinate?: No Leakage of urine?: No Urine stream starts and stops?: No Trouble starting stream?: No Do you have to strain to urinate?: No Blood in urine?: No Urinary tract infection?: No Sexually transmitted disease?: No Injury to kidneys or bladder?: No Painful intercourse?: No Weak stream?: No Erection problems?: No Penile  pain?: No  Gastrointestinal Nausea?: No Vomiting?: No Indigestion/heartburn?: No Diarrhea?: No Constipation?: No  Constitutional Fever: No Night sweats?: Yes Weight loss?: No Fatigue?: No  Skin Skin rash/lesions?: No Itching?: No  Eyes Blurred vision?: Yes Double vision?: No  Ears/Nose/Throat Sore throat?: No Sinus problems?: No  Hematologic/Lymphatic Swollen glands?: No Easy bruising?: No  Cardiovascular Leg swelling?: Yes Chest pain?: No  Respiratory Cough?: No Shortness of breath?: Yes  Endocrine Excessive thirst?: No  Musculoskeletal Back pain?: Yes Joint pain?: Yes  Neurological Headaches?: No Dizziness?: Yes  Psychologic Depression?: Yes Anxiety?: No  Physical Exam: BP 134/80 (BP Location: Left Arm, Patient Position: Sitting, Cuff Size: Large)   Pulse (!) 101   Ht 5\' 10"  (1.778 m)   Wt 239 lb 6.4 oz (108.6 kg)   BMI 34.35 kg/m   Constitutional:  Alert and oriented, No acute distress. HEENT: [place] AT, moist mucus membranes.  Trachea midline, no masses. Cardiovascular: No clubbing, cyanosis, or edema. Respiratory: Normal respiratory effort, no increased work of breathing. Skin: No rashes, bruises or suspicious lesions. Neurologic: Grossly intact, no focal deficits, moving all 4 extremities. Psychiatric: Normal mood and affect.  Laboratory Data: Lab Results  Component Value Date   WBC 9.2 02/13/2017   HGB 15.3 02/13/2017   HCT 43.4 02/13/2017   MCV 101.5 (H) 02/13/2017   PLT 139 (L) 02/13/2017    Lab Results  Component Value Date   CREATININE 1.05 02/13/2017   Urinalysis N/a  Pertinent Imaging: CLINICAL DATA:  Initial evaluation for acute lower abdominal pain. Increased urinary frequency. Suprapubic catheter in place.  EXAM: CT ABDOMEN AND PELVIS WITHOUT CONTRAST  TECHNIQUE: Multidetector CT imaging of the abdomen and pelvis was performed following the standard protocol without IV contrast.  COMPARISON:  Prior head CT  from 09/10/2015.  FINDINGS: Lower chest: Visualized lung bases are clear.  Hepatobiliary: Liver demonstrates a somewhat nodular contour, which may reflect underlying cirrhosis. Limited noncontrast evaluation liver is otherwise unremarkable. Gallbladder within normal limits. No biliary dilatation.  Pancreas: Pancreas within normal limits.  Spleen: Spleen within normal limits.  Adrenals/Urinary Tract: Right adrenal adenoma noted, stable. Adrenals are otherwise unremarkable. Kidneys equal in size without evidence for nephrolithiasis or hydronephrosis. No radiopaque calculi seen along the course of either renal collecting system. No hydroureter. Suprapubic catheter in place within the bladder. Catheter appears appropriately positioned. There is mild circumferential wall thickening with hazy stranding about the bladder itself, suggesting possible acute cystitis. Scattered layering bladder calculi present within the bladder lumen, largest of which measures 14 mm.  Stomach/Bowel: Stomach within normal limits. No evidence for bowel obstruction. Mild colonic diverticulosis without evidence for acute diverticulitis. No acute inflammatory changes seen about the bowels.  Vascular/Lymphatic: Intra-abdominal aorta of normal caliber. Mild aortic atherosclerosis. Few scattered porta hepatis nodes measure up to 16 mm, indeterminate. No other adenopathy  within the abdomen and pelvis.  Reproductive: Brachia therapy sees present within the prostate.  Other: Bilateral fat containing inguinal hernias noted, larger on the right. No free air or fluid.  Musculoskeletal: No acute osseus abnormality. Scattered predominantly subcentimeter sclerotic foci seen throughout the osseous structures, progressed relative to most recent CT from 09/10/2015. Findings suspected to reflect sequelae of metastatic prostate disease.  IMPRESSION: 1. Suprapubic catheter in appropriate position within the  bladder. Mild wall thickening with hazy stranding about the bladder may be related to catheterization or possibly acute cystitis. Correlation with urinalysis recommended. 2. Multiple superimposed layering bladder calculi as above. 3. Innumerable predominantly subcentimeter sclerotic foci within the visualized osseous structures, suspected to reflect sequelae of metastatic prostate cancer. These are increased relative to most recent CT from 2017. 4. Mild nodularity of the hepatic contour, suggesting possible cirrhosis. Correlation with LFTs suggested. 5. Enlarged porta hepatis adenopathy as above, indeterminate, but may be related underlying intrinsic liver disease.   Electronically Signed   By: Jeannine Boga M.D.   On: 02/13/2017 22:01  CT scan reviewed personally today and with the patient.  Assessment & Plan:    1. Urinary retention Chronic long-standing urinary retention Persistent after PVP Suspect some degree of neurogenic bladder at this point-agree with Dr. Bernardo Heater and recommend repeat urodynamics to evaluate bladder function Given that his prostate has a nonobstructing appearance and is failed previous transurethral procedure, I am hesitant to offer any further surgical intervention without evidence of ongoing bladder outlet obstruction Patient is agreeable to this plan  2. Prostate cancer [facility]) Managed by [facility] radiation oncology and Remains on Lupron for high risk prostate cancer  3. Bladder stones Incidental bladder stones on CT scan from 01/2017 Given he has issues with intermittently nondraining and episodes of lower pelvic pain/bladder spasms, I have offered to treat/remove his bladder stones which may or may not help with his urinary symptoms and catheter issues Risk and benefits of the procedure were explained in detail All questions were answered Will arrange for preop UA/urine culture   Schedule UDS, cystolithalopaxy (ideally prior to UDS)   Hollice Espy, MD  [facility] [address], [place], [place] [address] [phone]

## 2017-05-18 NOTE — H&P (View-Only) (Signed)
05/18/2017 8:40 AM   Gerald Odonnell 10/18/1954 [id number]  Referring provider: [facility], [address] [place], [place] [address]  Chief Complaint  Patient presents with  . Urinary Retention    HPI: 62 y.o. followed by Dr. Bernardo Heater who presents today for second opinion regarding ongoing urinary retention..  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  He was treated by radiation oncology with EB RT with brachy boost as well as Lupron.  Trus volume 23 cc.  Most recent PSA on 07/2016 1.17.  He continues to be followed by radiation oncology at [facility] and remains on Lupron, last injection 12/2016.  He is been a chronic urinary retention managed with an indwelling suprapubic tube since that time.  In 2015, he underwent urodynamics which showed evidence of outlet obstruction.  He was taken to the operating room by Dr. Bernardo Heater for PVP.  He voided a few times after that but then developed recurrent retention.  He is unable to self cath or tolerate an indwelling penile Foley.  He is been managed with a suprapubic tube.  Under the care of Dr. Bernardo Heater, he intermittently clamped the suprapubic tube and is able to void spontaneously for up to 5 hours before developing recurrent retention.  He reports today that he is very distraught about having a suprapubic tube.  He wishes to feel like a "man again" and void spontaneously through his penis.  Of note, the patient does have multiple bladder stones measuring up to 14 mm in his bladder.  He has had multiple ER visits for nondraining suprapubic tube, pelvic pain, and bladder spasms.   PMH: Past Medical History:  Diagnosis Date  . Cancer ([place])    colon  . Hypertension   . S/P colon resection   . Shortness of breath dyspnea   . Urinary retention     Surgical History: Past Surgical History:  Procedure Laterality Date  . ABDOMINAL SURGERY    .  PROSTATE SURGERY  05/25/2016  . SUPRAPUBIC CATHETER INSERTION      Home Medications:  Allergies as of 05/18/2017   No Known Allergies     Medication List        Accurate as of 05/18/17  8:40 AM. Always use your most recent med list.          acidophilus Caps capsule Take 2 capsules by mouth daily.   baclofen 10 MG tablet Commonly known as:  LIORESAL Take 10 mg 3 (three) times daily by mouth.   carvedilol 3.125 MG tablet Commonly known as:  COREG Take 3.125 mg 3 (three) times daily by mouth.   diphenhydramine-acetaminophen 25-500 MG Tabs tablet Commonly known as:  TYLENOL PM Take 1 tablet by mouth at bedtime as needed (for sleep/pain).   oxybutynin 5 MG tablet Commonly known as:  DITROPAN Take 1 tablet (5 mg total) 3 (three) times daily by mouth.   oxybutynin 5 MG tablet Commonly known as:  DITROPAN Take 1 tablet (5 mg total) by mouth 2 (two) times daily.   phenazopyridine 100 MG tablet Commonly known as:  PYRIDIUM Take 1 tablet (100 mg total) by mouth 3 (three) times daily as needed for pain.   tamsulosin 0.4 MG Caps capsule Commonly known as:  FLOMAX TAKE 1 CAPSULE EVERY DAY       Allergies: No Known Allergies  Family History: Family History  Problem Relation Age of Onset  . Hypertension  Other     Social History:  reports that he has been smoking cigarettes.  He has been smoking about 0.50 packs per day. he has never used smokeless tobacco. He reports that he does not drink alcohol or use drugs.  ROS: UROLOGY Frequent Urination?: No Hard to postpone urination?: No Burning/pain with urination?: No Get up at night to urinate?: No Leakage of urine?: No Urine stream starts and stops?: No Trouble starting stream?: No Do you have to strain to urinate?: No Blood in urine?: No Urinary tract infection?: No Sexually transmitted disease?: No Injury to kidneys or bladder?: No Painful intercourse?: No Weak stream?: No Erection problems?: No Penile  pain?: No  Gastrointestinal Nausea?: No Vomiting?: No Indigestion/heartburn?: No Diarrhea?: No Constipation?: No  Constitutional Fever: No Night sweats?: Yes Weight loss?: No Fatigue?: No  Skin Skin rash/lesions?: No Itching?: No  Eyes Blurred vision?: Yes Double vision?: No  Ears/Nose/Throat Sore throat?: No Sinus problems?: No  Hematologic/Lymphatic Swollen glands?: No Easy bruising?: No  Cardiovascular Leg swelling?: Yes Chest pain?: No  Respiratory Cough?: No Shortness of breath?: Yes  Endocrine Excessive thirst?: No  Musculoskeletal Back pain?: Yes Joint pain?: Yes  Neurological Headaches?: No Dizziness?: Yes  Psychologic Depression?: Yes Anxiety?: No  Physical Exam: BP 134/80 (BP Location: Left Arm, Patient Position: Sitting, Cuff Size: Large)   Pulse (!) 101   Ht 5\' 10"  (1.778 m)   Wt 239 lb 6.4 oz (108.6 kg)   BMI 34.35 kg/m   Constitutional:  Alert and oriented, No acute distress. HEENT: [place] AT, moist mucus membranes.  Trachea midline, no masses. Cardiovascular: No clubbing, cyanosis, or edema. Respiratory: Normal respiratory effort, no increased work of breathing. Skin: No rashes, bruises or suspicious lesions. Neurologic: Grossly intact, no focal deficits, moving all 4 extremities. Psychiatric: Normal mood and affect.  Laboratory Data: Lab Results  Component Value Date   WBC 9.2 02/13/2017   HGB 15.3 02/13/2017   HCT 43.4 02/13/2017   MCV 101.5 (H) 02/13/2017   PLT 139 (L) 02/13/2017    Lab Results  Component Value Date   CREATININE 1.05 02/13/2017   Urinalysis N/a  Pertinent Imaging: CLINICAL DATA:  Initial evaluation for acute lower abdominal pain. Increased urinary frequency. Suprapubic catheter in place.  EXAM: CT ABDOMEN AND PELVIS WITHOUT CONTRAST  TECHNIQUE: Multidetector CT imaging of the abdomen and pelvis was performed following the standard protocol without IV contrast.  COMPARISON:  Prior head CT  from 09/10/2015.  FINDINGS: Lower chest: Visualized lung bases are clear.  Hepatobiliary: Liver demonstrates a somewhat nodular contour, which may reflect underlying cirrhosis. Limited noncontrast evaluation liver is otherwise unremarkable. Gallbladder within normal limits. No biliary dilatation.  Pancreas: Pancreas within normal limits.  Spleen: Spleen within normal limits.  Adrenals/Urinary Tract: Right adrenal adenoma noted, stable. Adrenals are otherwise unremarkable. Kidneys equal in size without evidence for nephrolithiasis or hydronephrosis. No radiopaque calculi seen along the course of either renal collecting system. No hydroureter. Suprapubic catheter in place within the bladder. Catheter appears appropriately positioned. There is mild circumferential wall thickening with hazy stranding about the bladder itself, suggesting possible acute cystitis. Scattered layering bladder calculi present within the bladder lumen, largest of which measures 14 mm.  Stomach/Bowel: Stomach within normal limits. No evidence for bowel obstruction. Mild colonic diverticulosis without evidence for acute diverticulitis. No acute inflammatory changes seen about the bowels.  Vascular/Lymphatic: Intra-abdominal aorta of normal caliber. Mild aortic atherosclerosis. Few scattered porta hepatis nodes measure up to 16 mm, indeterminate. No other adenopathy  within the abdomen and pelvis.  Reproductive: Brachia therapy sees present within the prostate.  Other: Bilateral fat containing inguinal hernias noted, larger on the right. No free air or fluid.  Musculoskeletal: No acute osseus abnormality. Scattered predominantly subcentimeter sclerotic foci seen throughout the osseous structures, progressed relative to most recent CT from 09/10/2015. Findings suspected to reflect sequelae of metastatic prostate disease.  IMPRESSION: 1. Suprapubic catheter in appropriate position within the  bladder. Mild wall thickening with hazy stranding about the bladder may be related to catheterization or possibly acute cystitis. Correlation with urinalysis recommended. 2. Multiple superimposed layering bladder calculi as above. 3. Innumerable predominantly subcentimeter sclerotic foci within the visualized osseous structures, suspected to reflect sequelae of metastatic prostate cancer. These are increased relative to most recent CT from 2017. 4. Mild nodularity of the hepatic contour, suggesting possible cirrhosis. Correlation with LFTs suggested. 5. Enlarged porta hepatis adenopathy as above, indeterminate, but may be related underlying intrinsic liver disease.   Electronically Signed   By: Jeannine Boga M.D.   On: 02/13/2017 22:01  CT scan reviewed personally today and with the patient.  Assessment & Plan:    1. Urinary retention Chronic long-standing urinary retention Persistent after PVP Suspect some degree of neurogenic bladder at this point-agree with Dr. Bernardo Heater and recommend repeat urodynamics to evaluate bladder function Given that his prostate has a nonobstructing appearance and is failed previous transurethral procedure, I am hesitant to offer any further surgical intervention without evidence of ongoing bladder outlet obstruction Patient is agreeable to this plan  2. Prostate cancer [facility]) Managed by [facility] radiation oncology and Remains on Lupron for high risk prostate cancer  3. Bladder stones Incidental bladder stones on CT scan from 01/2017 Given he has issues with intermittently nondraining and episodes of lower pelvic pain/bladder spasms, I have offered to treat/remove his bladder stones which may or may not help with his urinary symptoms and catheter issues Risk and benefits of the procedure were explained in detail All questions were answered Will arrange for preop UA/urine culture   Schedule UDS, cystolithalopaxy (ideally prior to UDS)   Hollice Espy, MD  [facility] [address], [place], [place] [address] [phone]

## 2017-05-19 ENCOUNTER — Other Ambulatory Visit: Payer: Self-pay | Admitting: Radiology

## 2017-05-25 ENCOUNTER — Other Ambulatory Visit: Payer: Self-pay

## 2017-05-25 ENCOUNTER — Encounter
Admission: RE | Admit: 2017-05-25 | Discharge: 2017-05-25 | Disposition: A | Discharge status: Home / Self Care | Payer: Medicare HMO | Source: Ambulatory Visit | Attending: Urology | Admitting: Urology

## 2017-05-25 ENCOUNTER — Ambulatory Visit (INDEPENDENT_AMBULATORY_CARE_PROVIDER_SITE_OTHER): Payer: Medicare HMO

## 2017-05-25 VITALS — BP 128/85 | HR 78 | Ht 70.0 in | Wt 238.2 lb

## 2017-05-25 DIAGNOSIS — N401 Enlarged prostate with lower urinary tract symptoms: Secondary | ICD-10-CM

## 2017-05-25 DIAGNOSIS — N138 Other obstructive and reflux uropathy: Secondary | ICD-10-CM

## 2017-05-25 DIAGNOSIS — N210 Calculus in bladder: Secondary | ICD-10-CM

## 2017-05-25 DIAGNOSIS — Z01810 Encounter for preprocedural cardiovascular examination: Secondary | ICD-10-CM | POA: Insufficient documentation

## 2017-05-25 HISTORY — DX: Malignant neoplasm of prostate: C61

## 2017-05-25 HISTORY — DX: Personal history of urinary calculi: Z87.442

## 2017-05-25 LAB — URINALYSIS, COMPLETE
Bilirubin, UA: NEGATIVE
Glucose, UA: NEGATIVE
Ketones, UA: NEGATIVE
Nitrite, UA: POSITIVE — AB
PH UA: 6 (ref 5.0–7.5)
PROTEIN UA: NEGATIVE
RBC, UA: NEGATIVE
SPEC GRAV UA: 1.005 (ref 1.005–1.030)
Urobilinogen, Ur: 0.2 mg/dL (ref 0.2–1.0)

## 2017-05-25 LAB — MICROSCOPIC EXAMINATION

## 2017-05-25 NOTE — Progress Notes (Signed)
Patient provided urine sample for U/A and UCx prior to surgery.

## 2017-05-25 NOTE — Patient Instructions (Signed)
Your procedure is scheduled on: Wednesday, June 02, 2017 Report to Same Day Surgery on the 2nd floor in the [facility]. To find out your arrival time, please call [phone] between 1PM - 3PM on: Tuesday, June 01, 2017  REMEMBER: Instructions that are not followed completely may result in serious medical risk, up to and including death; or upon the discretion of your surgeon and anesthesiologist your surgery may need to be rescheduled.  Do not eat food after midnight the night before your procedure.  No gum chewing or hard candies.  You may however, drink CLEAR liquids up to 2 hours before you are scheduled to arrive at the hospital for your procedure.  Do not drink clear liquids within 2 hours of the start of your surgery.  Clear liquids include: - water  - apple juice without pulp - clear gatorade - black coffee or tea (Do NOT add anything to the coffee or tea) Do NOT drink anything that is not on this list.  No Alcohol for 24 hours before or after surgery.  No Smoking including e-cigarettes for 24 hours prior to surgery. No chewable tobacco products for at least 6 hours prior to surgery. No nicotine patches on the day of surgery.  On the morning of surgery brush your teeth with toothpaste and water, you may rinse your mouth with mouthwash if you wish. Do not swallow any  toothpaste of mouthwash.  Notify your doctor if there is any change in your medical condition (cold, fever, infection).  Do not wear jewelry, make-up, hairpins, clips or nail polish.  Do not wear lotions, powders, or perfumes. You may wear deodorant.  Do not shave 48 hours prior to surgery. Men may shave face and neck.  Contacts and dentures may not be worn into surgery.  Do not bring valuables to the hospital. [facility] is not responsible for any belongings or valuables.  TAKE THESE MEDICATIONS THE MORNING OF SURGERY WITH A SIP OF WATER:  1.  carvedilol  NOW!  Stop Anti-inflammatories such  as Advil, Aleve, Ibuprofen, Motrin, Naproxen, Naprosyn, Goodie powder, or aspirin products. (May take Tylenol or Acetaminophen if needed.)  NOW!  Stop ANY OVER THE COUNTER supplements until after surgery.  If you are being discharged the day of surgery, you will not be allowed to drive home. You will need someone to drive you home and stay with you that night.   If you are taking public transportation, you will need to have a responsible adult to with you.  Please call the number above if you have any questions about these instructions.

## 2017-05-26 ENCOUNTER — Other Ambulatory Visit: Payer: Self-pay | Admitting: Internal Medicine

## 2017-05-26 DIAGNOSIS — R0602 Shortness of breath: Secondary | ICD-10-CM

## 2017-05-31 ENCOUNTER — Telehealth: Payer: Self-pay | Admitting: Radiology

## 2017-05-31 DIAGNOSIS — N390 Urinary tract infection, site not specified: Secondary | ICD-10-CM

## 2017-05-31 LAB — CULTURE, URINE COMPREHENSIVE

## 2017-05-31 MED ORDER — SULFAMETHOXAZOLE-TRIMETHOPRIM 800-160 MG PO TABS: 1.0000 | ORAL_TABLET | Freq: Two times a day (BID) | ORAL | 0 refills | Status: DC

## 2017-05-31 NOTE — Telephone Encounter (Signed)
-----   Message from Hollice Espy, MD sent at 05/31/2017  2:19 PM EST ----- Yeah but don't start treatment until 7 days before surgery, for a total of 7 days.  Hollice Espy, MD

## 2017-05-31 NOTE — Telephone Encounter (Signed)
-----   Message from Hollice Espy, MD sent at 05/31/2017  1:53 PM EST ----- Lets treat with Bactrim DS bid starting 7 days before surgery to reduce bacterial load and push surgery out one week from Wednesday as discussed.    Hollice Espy, MD

## 2017-05-31 NOTE — Telephone Encounter (Signed)
Notified pt of +ucx with pending sensitivities resulting in need to reschedule surgery to 06/09/2017. Instructions given. Will contact pt with order for abx once ucx results. Pt voices understanding with no further questions at this time.

## 2017-05-31 NOTE — Telephone Encounter (Signed)
Notified pt of script sent to pharmacy. Instructions given. Pt voices understanding & has no questions at this time.

## 2017-06-08 MED ORDER — SODIUM CHLORIDE 0.9 % IV SOLN
1.0000 g | INTRAVENOUS | Status: AC
  Administered 2017-06-09: 1 g via INTRAVENOUS
  Filled 2017-06-08: qty 10

## 2017-06-09 ENCOUNTER — Encounter: Payer: Self-pay | Admitting: *Deleted

## 2017-06-09 ENCOUNTER — Ambulatory Visit: Payer: Medicare HMO | Admitting: Certified Registered"

## 2017-06-09 ENCOUNTER — Encounter
Admission: RE | Disposition: A | Discharge status: Home / Self Care | Payer: Self-pay | Source: Ambulatory Visit | Attending: Urology

## 2017-06-09 ENCOUNTER — Ambulatory Visit
Admission: RE | Admit: 2017-06-09 | Discharge: 2017-06-09 | Disposition: A | Discharge status: Home / Self Care | Payer: Medicare HMO | Source: Ambulatory Visit | Attending: Urology | Admitting: Urology

## 2017-06-09 DIAGNOSIS — Z79899 Other long term (current) drug therapy: Secondary | ICD-10-CM | POA: Diagnosis not present

## 2017-06-09 DIAGNOSIS — R339 Retention of urine, unspecified: Secondary | ICD-10-CM | POA: Diagnosis not present

## 2017-06-09 DIAGNOSIS — N210 Calculus in bladder: Secondary | ICD-10-CM | POA: Diagnosis not present

## 2017-06-09 DIAGNOSIS — C61 Malignant neoplasm of prostate: Secondary | ICD-10-CM | POA: Insufficient documentation

## 2017-06-09 DIAGNOSIS — Z85038 Personal history of other malignant neoplasm of large intestine: Secondary | ICD-10-CM | POA: Diagnosis not present

## 2017-06-09 DIAGNOSIS — Z9049 Acquired absence of other specified parts of digestive tract: Secondary | ICD-10-CM | POA: Diagnosis not present

## 2017-06-09 DIAGNOSIS — R0600 Dyspnea, unspecified: Secondary | ICD-10-CM | POA: Insufficient documentation

## 2017-06-09 DIAGNOSIS — N3289 Other specified disorders of bladder: Secondary | ICD-10-CM | POA: Diagnosis not present

## 2017-06-09 DIAGNOSIS — Z9889 Other specified postprocedural states: Secondary | ICD-10-CM | POA: Insufficient documentation

## 2017-06-09 DIAGNOSIS — G8929 Other chronic pain: Secondary | ICD-10-CM | POA: Insufficient documentation

## 2017-06-09 DIAGNOSIS — F17210 Nicotine dependence, cigarettes, uncomplicated: Secondary | ICD-10-CM | POA: Diagnosis not present

## 2017-06-09 DIAGNOSIS — R102 Pelvic and perineal pain: Secondary | ICD-10-CM | POA: Diagnosis not present

## 2017-06-09 DIAGNOSIS — I10 Essential (primary) hypertension: Secondary | ICD-10-CM | POA: Diagnosis not present

## 2017-06-09 DIAGNOSIS — Z8546 Personal history of malignant neoplasm of prostate: Secondary | ICD-10-CM | POA: Diagnosis not present

## 2017-06-09 DIAGNOSIS — R338 Other retention of urine: Secondary | ICD-10-CM | POA: Diagnosis not present

## 2017-06-09 HISTORY — PX: CYSTOSCOPY WITH LITHOLAPAXY: SHX1425

## 2017-06-09 SURGERY — CYSTOSCOPY, WITH BLADDER CALCULUS LITHOLAPAXY
Anesthesia: General | Site: Urethra | Wound class: Clean

## 2017-06-09 MED ORDER — ONDANSETRON HCL 4 MG/2ML IJ SOLN
INTRAMUSCULAR | Status: DC | PRN
  Administered 2017-06-09: 4 mg via INTRAVENOUS

## 2017-06-09 MED ORDER — OXYBUTYNIN CHLORIDE 5 MG PO TABS
5.0000 mg | ORAL_TABLET | Freq: Two times a day (BID) | ORAL | Status: DC
  Administered 2017-06-09: 5 mg via ORAL
  Filled 2017-06-09: qty 1

## 2017-06-09 MED ORDER — FENTANYL CITRATE (PF) 100 MCG/2ML IJ SOLN
25.0000 ug | INTRAMUSCULAR | Status: AC | PRN
  Administered 2017-06-09 (×6): 25 ug via INTRAVENOUS

## 2017-06-09 MED ORDER — ONDANSETRON HCL 4 MG/2ML IJ SOLN
INTRAMUSCULAR | Status: AC
  Filled 2017-06-09: qty 2

## 2017-06-09 MED ORDER — OXYCODONE-ACETAMINOPHEN 5-325 MG PO TABS
ORAL_TABLET | ORAL | Status: AC
  Administered 2017-06-09: 1 via ORAL
  Filled 2017-06-09: qty 1

## 2017-06-09 MED ORDER — OXYBUTYNIN CHLORIDE 5 MG PO TABS
ORAL_TABLET | ORAL | Status: AC
  Filled 2017-06-09: qty 1

## 2017-06-09 MED ORDER — FAMOTIDINE 20 MG PO TABS
20.0000 mg | ORAL_TABLET | Freq: Once | ORAL | Status: AC
  Administered 2017-06-09: 20 mg via ORAL

## 2017-06-09 MED ORDER — FENTANYL CITRATE (PF) 100 MCG/2ML IJ SOLN
INTRAMUSCULAR | Status: AC
  Administered 2017-06-09: 25 ug via INTRAVENOUS
  Filled 2017-06-09: qty 2

## 2017-06-09 MED ORDER — FENTANYL CITRATE (PF) 100 MCG/2ML IJ SOLN
INTRAMUSCULAR | Status: AC
  Filled 2017-06-09: qty 2

## 2017-06-09 MED ORDER — SODIUM CHLORIDE 0.9 % IV SOLN
INTRAVENOUS | Status: DC
  Administered 2017-06-09: 10:00:00 via INTRAVENOUS

## 2017-06-09 MED ORDER — PHENYLEPHRINE HCL 10 MG/ML IJ SOLN
INTRAMUSCULAR | Status: AC
  Filled 2017-06-09: qty 1

## 2017-06-09 MED ORDER — PROPOFOL 10 MG/ML IV BOLUS
INTRAVENOUS | Status: DC | PRN
  Administered 2017-06-09: 160 mg via INTRAVENOUS

## 2017-06-09 MED ORDER — MIDAZOLAM HCL 2 MG/2ML IJ SOLN
INTRAMUSCULAR | Status: AC
  Filled 2017-06-09: qty 2

## 2017-06-09 MED ORDER — LIDOCAINE HCL (CARDIAC) 20 MG/ML IV SOLN
INTRAVENOUS | Status: DC | PRN
  Administered 2017-06-09: 100 mg via INTRAVENOUS

## 2017-06-09 MED ORDER — MIDAZOLAM HCL 2 MG/2ML IJ SOLN
INTRAMUSCULAR | Status: DC | PRN
  Administered 2017-06-09: 2 mg via INTRAVENOUS

## 2017-06-09 MED ORDER — LIDOCAINE HCL (PF) 2 % IJ SOLN
INTRAMUSCULAR | Status: AC
  Filled 2017-06-09: qty 10

## 2017-06-09 MED ORDER — FAMOTIDINE 20 MG PO TABS
ORAL_TABLET | ORAL | Status: AC
  Filled 2017-06-09: qty 1

## 2017-06-09 MED ORDER — FENTANYL CITRATE (PF) 100 MCG/2ML IJ SOLN
INTRAMUSCULAR | Status: DC | PRN
  Administered 2017-06-09 (×4): 50 ug via INTRAVENOUS

## 2017-06-09 MED ORDER — PROPOFOL 10 MG/ML IV BOLUS
INTRAVENOUS | Status: AC
  Filled 2017-06-09: qty 20

## 2017-06-09 MED ORDER — OXYCODONE-ACETAMINOPHEN 5-325 MG PO TABS
1.0000 | ORAL_TABLET | Freq: Once | ORAL | Status: AC
  Administered 2017-06-09: 1 via ORAL

## 2017-06-09 MED ORDER — ONDANSETRON HCL 4 MG/2ML IJ SOLN: 4.0000 mg | Freq: Once | INTRAMUSCULAR | Status: DC | PRN

## 2017-06-09 SURGICAL SUPPLY — 14 items
ADAPTER IRRIG TUBE 2 SPIKE SOL (ADAPTER) ×3 IMPLANT
BAG DRAIN CYSTO-URO LG1000N (MISCELLANEOUS) ×3 IMPLANT
BASKET ZERO TIP 1.9FR (BASKET) ×3 IMPLANT
FIBER LASER 550 (Laser) ×3 IMPLANT
GLOVE BIO SURGEON STRL SZ 6.5 (GLOVE) ×2 IMPLANT
GLOVE BIO SURGEONS STRL SZ 6.5 (GLOVE) ×1
GOWN STRL REUS W/ TWL LRG LVL3 (GOWN DISPOSABLE) ×2 IMPLANT
GOWN STRL REUS W/TWL LRG LVL3 (GOWN DISPOSABLE) ×4
KIT TURNOVER CYSTO (KITS) ×3 IMPLANT
PACK CYSTO AR (MISCELLANEOUS) ×3 IMPLANT
SET IRRIG Y TYPE TUR BLADDER L (SET/KITS/TRAYS/PACK) ×3 IMPLANT
SYRINGE IRR TOOMEY STRL 70CC (SYRINGE) ×3 IMPLANT
WATER STERILE IRR 1000ML POUR (IV SOLUTION) ×3 IMPLANT
WATER STERILE IRR 3000ML UROMA (IV SOLUTION) ×3 IMPLANT

## 2017-06-09 NOTE — OR Nursing (Signed)
Leg bag applied to right leg per pt request. Discussed discharge instructions. Pt and sister voice understanding.

## 2017-06-09 NOTE — Op Note (Signed)
Date of procedure: 06/09/17  Preoperative diagnosis:  1. Prostate cancer 2. Chronic urinary retention 3. Bladder stones   Postoperative diagnosis:  1. Seems above  Procedure: 1. Cystolitholapaxy 2. Suprapubic tube exchange  Surgeon: Hollice Espy, MD  Anesthesia: General  Complications: None  Intraoperative findings: 4- 5 round bladder stones measuring up to 14 mm.  Prostate surgically absent.  Fixed bladder neck.  EBL: Minimal  Specimens: None  Drains: 16 French Foley with 10 cc balloon per suprapubic tube tract  Indication: Gerald Odonnell is a 62 y.o. patient with history of chronic urinary retention, prostate cancer status post EBRT with brachytherapy boost/Lupron.  He is been having intermittent issues with his catheter draining and found to have multiple bladder stones.  He presents today for treatment of this.  After reviewing the management options for treatment, he elected to proceed with the above surgical procedure(s). We have discussed the potential benefits and risks of the procedure, side effects of the proposed treatment, the likelihood of the patient achieving the goals of the procedure, and any potential problems that might occur during the procedure or recuperation. Informed consent has been obtained.  Specifically, expectations for the surgery were set again in the preoperative holding area.  He understands that this will likely not impact his ability to void spontaneously.  This will likely only help with his catheter drainage and clogging issues.  It is unlikely that this is the source of his chronic pelvic pain.  Description of procedure:  The patient was taken to the operating room and general anesthesia was induced.  The patient was placed in the dorsal lithotomy position, prepped and draped in the usual sterile fashion, and preoperative antibiotics were administered. A preoperative time-out was performed.   A 26 French resectoscope was advanced per urethra  into the bladder.  Notably, he had fixed difficult to navigate small prostate which took some effort to get the scope into the bladder.  Once the scope was successfully in the bladder under direct visualization, I did have some issues manipulating the scope due to a relatively fixed pelvis.  The bladder stones were encountered near the bladder neck.  This point time, 500 m laser fiber was then brought in and using settings of 1.5 J and 30 Hz, the stones were fragmented into very small pieces.  The pieces were then evacuated from the bladder using a [place] syringe evacuator with continuous irrigation.  I was ultimately able to clear all fragments from the bladder.  Due to inability to visualize the remainder of the bladder, the scope was removed and a 16 [place] flexible scope was reintroduced.  The bladder was carefully inspected and noted to have no residual stone burden, no masses in the bladder, and the suprapubic tract site could be clearly visualized.  The scope was then removed.  His 16 French suprapubic tube was exchanged for a new 16 French suprapubic tube with 10 cc in the balloon.  Patient was then cleaned and dried, repositioned the supine position, reverse of anesthesia, and taken to PACU in stable condition.  Plan: Patient will follow-up in 1 month for suprapubic tube exchange.  No scripts will be given today as the procedure was relatively atraumatic.  Continue oxybutynin as needed for bladder spasms.  Hollice Espy, M.D.

## 2017-06-09 NOTE — Interval H&P Note (Signed)
History and Physical Interval Note:  06/09/2017 10:44 AM  Gerald Odonnell  has presented today for surgery, with the diagnosis of Bladder stones  The various methods of treatment have been discussed with the patient and family. After consideration of risks, benefits and other options for treatment, the patient has consented to  Procedure(s): CYSTOSCOPY WITH LITHOLAPAXY (N/A) as a surgical intervention .  The patient's history has been reviewed, patient examined, no change in status, stable for surgery.  I have reviewed the patient's chart and labs.  Questions were answered to the patient's satisfaction.    RRR CTAB   Hollice Espy

## 2017-06-09 NOTE — Anesthesia Preprocedure Evaluation (Signed)
Anesthesia Evaluation  Patient identified by MRN, date of birth, ID band Patient awake    Reviewed: Allergy & Precautions, NPO status , Patient's Chart, lab work & pertinent test results  History of Anesthesia Complications Negative for: history of anesthetic complications  Airway Mallampati: II       Dental   Pulmonary neg sleep apnea, neg COPD, Current Smoker (quit x 2 weeks),           Cardiovascular hypertension, Pt. on medications and Pt. on home beta blockers (-) Past MI and (-) CHF (-) dysrhythmias (-) Valvular Problems/Murmurs     Neuro/Psych neg Seizures    GI/Hepatic Neg liver ROS, neg GERD  ,  Endo/Other  neg diabetes  Renal/GU negative Renal ROS     Musculoskeletal   Abdominal   Peds  Hematology   Anesthesia Other Findings   Reproductive/Obstetrics                             Anesthesia Physical Anesthesia Plan  ASA: II  Anesthesia Plan: General   Post-op Pain Management:    Induction: Intravenous  PONV Risk Score and Plan:   Airway Management Planned: LMA  Additional Equipment:   Intra-op Plan:   Post-operative Plan:   Informed Consent: I have reviewed the patients History and Physical, chart, labs and discussed the procedure including the risks, benefits and alternatives for the proposed anesthesia with the patient or authorized representative who has indicated his/her understanding and acceptance.     Plan Discussed with:   Anesthesia Plan Comments:         Anesthesia Quick Evaluation

## 2017-06-09 NOTE — Transfer of Care (Signed)
Immediate Anesthesia Transfer of Care Note  Patient: Gerald Odonnell  Procedure(s) Performed: CYSTOSCOPY WITH LITHOLAPAXY (N/A Urethra)  Patient Location: PACU  Anesthesia Type:General  Level of Consciousness: awake, alert  and responds to stimulation  Airway & Oxygen Therapy: Patient Spontanous Breathing and Patient connected to face mask oxygen  Post-op Assessment: Report given to RN and Post -op Vital signs reviewed and stable  Post vital signs: Reviewed and stable  Last Vitals:  Vitals:   06/09/17 0939 06/09/17 1157  BP: (!) 117/99 (!) 145/104  Pulse: 97 (!) 104  Resp: 16 14  Temp: 37.2 C   SpO2: 99% 100%    Last Pain:  Vitals:   06/09/17 0939  PainSc: 8          Complications: No apparent anesthesia complications

## 2017-06-09 NOTE — OR Nursing (Signed)
Pt and sister did not want to wait on Dr Erlene Quan to finish surgery. Asked me to see if Dr Erlene Quan would call in pain med to [place] on [facility]

## 2017-06-09 NOTE — Discharge Instructions (Signed)
Bladder Stone °A bladder stone is a buildup of crystals made from the proteins and minerals found in urine. These substances build up when your urine becomes too concentrated. Bladder stones usually develop when you have another medical condition that prevents your bladder from emptying completely. Crystals can form in the small amount of urine left in your bladder. °Bladder stones that grow large can become painful and block the flow of urine. °What are the causes? °Bladder stones can be caused by: °· An enlarged prostate, which prevents the bladder from emptying well. °· A urinary tract infection (UTI). °· A weak spot in the bladder that creates a small pouch (bladder diverticulum). °· Nerve damage that may interfere with the messages from your brain to your bladder muscles (neurogenic bladder). This can result from conditions such as Parkinson disease or spinal cord injuries. ° °What increases the risk? °This condition is more likely to develop in people who: °· Get frequent UTIs. °· Have another medical condition that affects their bladder. °· Have a history of bladder surgery. °· Have a spinal cord injury. °· Have an abnormally shaped bladder (deformity). ° °What are the signs or symptoms? °Small bladder stones do not always cause symptoms. Larger stones can cause symptoms that include: °· Abdominal pain. °· A frequent need to urinate. °· Difficulty urinating. °· Painful urination. °· Blood in the urine. °· Cloudy or dark colored urine. °· Pain in the penis or testicles for men. ° °How is this diagnosed? °This condition is diagnosed based on your symptoms, medical history, and a physical exam. The exam will include checking for abdominal tenderness. For men, a rectal exam may be done to check the prostate gland. You may also have other tests, such as: °· A urine test (urinalysis) to find out more about your condition. °· A urine sample test to check for other infections (culture). °· Blood tests, including  tests to look for a substance called creatinine. A creatinine level that is higher than normal could indicate a blockage. °· A procedure to examine the inside of your bladder using a thin scope with a tiny lighted camera (cystoscopy) inserted through the urethra. ° °You may also have imaging studies such as: °· A CT scan of your abdomen and pelvis to look for a stone and check whether it is blocking the flow of urine. °· An X-ray of your kidneys, ureters, bladder, and urethra after you have a type of dye (contrast material) injected into your veins (intravenous pyelogram or IVP). °· An abdominal and pelvic ultrasound to locate bladder stones and identify areas where urine flow is blocked. ° °How is this treated? °Small bladder stones do not require treatment. They can pass out of your body on their own. You may be instructed to drink extra water to help the stone pass through the bladder. °Larger stones may need to be removed with one of the following procedures: °· Cystolitholapaxy. A cystoscope is inserted through the urethra and into the bladder to view the stone. A laser, ultrasound, or other device is used to break the stone into smaller pieces. Fluids are used to flush the small pieces from the area. °· Surgical removal. You may need surgery to remove the stone if it is large and causing pain. A small incision is made in the bladder to directly remove the stone. °· If the stone blocks the flow of urine, you may have a thin, flexible tube (stent) threaded into your ureter. The stent may be left in   place after removal of a stone to ensure flow of urine until healing is complete. ° °Follow these instructions at home: °· Drink enough fluid to keep your urine clear or pale yellow. °· Report unusual urinary symptoms to your health care provider. Early diagnosis of an enlarged prostate and other bladder conditions may reduce your chance of getting bladder stones. °· Avoid smoking and illegal drug use. °Contact a  health care provider if: °· You have a fever. °· You feel nauseous or vomit. °· You are unable to urinate. °· You have a large amount of blood in your urine. °Get help right away if: °· You have severe back pain or lower abdominal pain. °· You are vomiting and cannot keep down any medicines or water. °This information is not intended to replace advice given to you by your health care provider. Make sure you discuss any questions you have with your health care provider. °Document Released: 04/28/2015 Document Revised: 09/20/2015 Document Reviewed: 04/28/2015 °Elsevier Interactive Patient Education © 2018 [employer] ° ° °AMBULATORY SURGERY  °DISCHARGE INSTRUCTIONS ° ° °1) The drugs that you were given will stay in your system until tomorrow so for the next 24 hours you should not: ° °A) Drive an automobile °B) Make any legal decisions °C) Drink any alcoholic beverage ° ° °2) You may resume regular meals tomorrow.  Today it is better to start with liquids and gradually work up to solid foods. ° °You may eat anything you prefer, but it is better to start with liquids, then soup and crackers, and gradually work up to solid foods. ° ° °3) Please notify your doctor immediately if you have any unusual bleeding, trouble breathing, redness and pain at the surgery site, drainage, fever, or pain not relieved by medication. ° ° °4) Additional Instructions: ° ° °Please contact your physician with any problems or Same Day Surgery at [phone], Monday through Friday 6 am to 4 pm, or [facility] [place] Main number at [phone]. °

## 2017-06-09 NOTE — Anesthesia Post-op Follow-up Note (Signed)
Anesthesia QCDR form completed.        

## 2017-06-09 NOTE — Anesthesia Procedure Notes (Signed)
Procedure Name: LMA Insertion Performed by: Beecher Furio, CRNA Pre-anesthesia Checklist: Patient identified, Patient being monitored, Timeout performed, Emergency Drugs available and Suction available Patient Re-evaluated:Patient Re-evaluated prior to induction Oxygen Delivery Method: Circle system utilized Preoxygenation: Pre-oxygenation with 100% oxygen Induction Type: IV induction LMA: LMA inserted LMA Size: 4.5 Tube type: Oral Number of attempts: 1 Placement Confirmation: positive ETCO2 and breath sounds checked- equal and bilateral Tube secured with: Tape Dental Injury: Teeth and Oropharynx as per pre-operative assessment        

## 2017-06-09 NOTE — Anesthesia Postprocedure Evaluation (Signed)
Anesthesia Post Note  Patient: Gerald Odonnell  Procedure(s) Performed: CYSTOSCOPY WITH LITHOLAPAXY (N/A Urethra)  Patient location during evaluation: PACU Anesthesia Type: General Level of consciousness: awake and alert Pain management: pain level controlled Vital Signs Assessment: post-procedure vital signs reviewed and stable Respiratory status: spontaneous breathing and respiratory function stable Cardiovascular status: stable Anesthetic complications: no     Last Vitals:  Vitals:   06/09/17 1158 06/09/17 1205  BP: (!) 145/104   Pulse: 95 99  Resp: 12 18  Temp: 36.8 C   SpO2: 100% 100%    Last Pain:  Vitals:   06/09/17 1205  PainSc: 10-Worst pain ever                 Daanish Copes K

## 2017-06-09 NOTE — OR Nursing (Signed)
Dr Erlene Quan states she does not wish to call in pain medication for patient.

## 2017-06-10 ENCOUNTER — Telehealth: Payer: Self-pay | Admitting: Radiology

## 2017-06-10 NOTE — Telephone Encounter (Signed)
Myrbetriq samples x 1 week should help.  Can be taken in addition to him home meds.    Hollice Espy, MD

## 2017-06-10 NOTE — Telephone Encounter (Signed)
Pt c/o cramping in abdomen & bladder since surgery yesterday. States he is taking oxybutynin & tamsulosin as prescribed without relief. Please advise.

## 2017-06-10 NOTE — Telephone Encounter (Signed)
Notified pt of Dr Cherrie Gauze message below. Will leave samples at front desk. Pt aware & has no further questions at this time.

## 2017-06-15 ENCOUNTER — Encounter: Payer: Self-pay | Admitting: Nurse Practitioner

## 2017-06-24 ENCOUNTER — Telehealth: Payer: Self-pay | Admitting: Radiology

## 2017-06-24 NOTE — Telephone Encounter (Signed)
Pt is due for SPT exchange. Does he need an appt here or does he have home health? Also, pt had cystolitholapaxy on 06/09/2017. When should he follow up for this?

## 2017-06-25 NOTE — Telephone Encounter (Signed)
I wanted him to be scheduled 1 mo post op with me (its in my note and what I ordered when he was discharged).  We can exchange his SPT or he can do it himself (I'm not sure who was doing it past).  Hollice Espy, MD

## 2017-07-07 ENCOUNTER — Ambulatory Visit (INDEPENDENT_AMBULATORY_CARE_PROVIDER_SITE_OTHER): Payer: Medicare HMO | Admitting: Urology

## 2017-07-07 ENCOUNTER — Encounter: Payer: Self-pay | Admitting: Urology

## 2017-07-07 VITALS — BP 121/80 | HR 93 | Ht 70.0 in | Wt 236.0 lb

## 2017-07-07 DIAGNOSIS — R300 Dysuria: Secondary | ICD-10-CM | POA: Diagnosis not present

## 2017-07-07 DIAGNOSIS — R339 Retention of urine, unspecified: Secondary | ICD-10-CM | POA: Diagnosis not present

## 2017-07-07 DIAGNOSIS — N3289 Other specified disorders of bladder: Secondary | ICD-10-CM

## 2017-07-07 DIAGNOSIS — C61 Malignant neoplasm of prostate: Secondary | ICD-10-CM

## 2017-07-07 LAB — MICROSCOPIC EXAMINATION
EPITHELIAL CELLS (NON RENAL): NONE SEEN /HPF (ref 0–10)
RBC, UA: >30 /hpf — ABNORMAL HIGH (ref 0–?)

## 2017-07-07 LAB — URINALYSIS, COMPLETE
Bilirubin, UA: NEGATIVE
Glucose, UA: NEGATIVE
Ketones, UA: NEGATIVE
NITRITE UA: POSITIVE — AB
PH UA: 6 (ref 5.0–7.5)
Specific Gravity, UA: 1.015 (ref 1.005–1.030)
Urobilinogen, Ur: 1 mg/dL (ref 0.2–1.0)

## 2017-07-07 MED ORDER — MIRABEGRON ER 25 MG PO TB24: 25.0000 mg | ORAL_TABLET | Freq: Every day | ORAL | 3 refills | Status: DC

## 2017-07-07 MED ORDER — OXYBUTYNIN CHLORIDE 5 MG PO TABS: 5.0000 mg | ORAL_TABLET | Freq: Three times a day (TID) | ORAL | 3 refills | Status: DC

## 2017-07-07 NOTE — Progress Notes (Signed)
Suprapubic Cath Change  Patient is present today for a suprapubic catheter change due to urinary retention.  56m of water was drained from the balloon, a 16FR foley cath was removed from the tract with some resistance met, Dr. BErlene Quanwas called in for assistance and was able to remove foley.  Site was cleaned and prepped in a sterile fashion with betadine.  A 16FR foley cath was replaced into the tract complications were noted as: some encrustation noted on cath tip that did cause some difficulty with removal.. Urine return was noted, 10 ml of sterile water was inflated into the balloon and a leg bag was attached for drainage.  Patient tolerated well. A night bag was given to patient and proper instruction was given on how to switch bags.    Preformed by: AHollice Espy MD and SFonnie Jarvis CMA  Follow up: UA and culture was sent from new exchange due to dysuria, oxybutinin refilled

## 2017-07-07 NOTE — Progress Notes (Signed)
07/07/2017 7:48 PM   Gerald Odonnell 10/18/1954 [id number]  Referring provider: Ronnell Freshwater, NP [address] [place] [address]  Chief Complaint  Patient presents with  . Post-op Follow-up    1 month    HPI: 62 year old male with a personal history of high risk prostate cancer with chronic urinary retention managed with a suprapubic tube now status post cystolitholapaxy on 06/09/2017.  He returns the office today for suprapubic tube exchange.  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  He was treated by radiation oncology with EB RT with brachy boost as well as Lupron.  Trus volume 23 cc.  Most recent PSA on 07/2016 1.17.  He continues to be followed by radiation oncology at [facility] and remains on Lupron, last injection 12/2016.  He is been a chronic urinary retention managed with an indwelling suprapubic tube since that time.  In 2015, he underwent urodynamics which showed evidence of outlet obstruction.  He was taken to the operating room by Dr. Bernardo Heater for PVP.  He voided a few times after that but then developed recurrent retention.  He is unable to self cath or tolerate an indwelling penile Foley.  He is been managed with a suprapubic tube.  He reports today that he continues to have diffuse abdominal pelvic and penile pain.  He is been experiencing bladder spasms following the procedure.  He had significant improvement with Myrbetriq 25 mg.  He would like to try to continue this medication but worries about the cost.  He is anxious today about resuming clamping his suprapubic tube and attempting to void from his penis.  When he tried this a few days ago, he experienced dysuria and burning in his penis.  He wonders if he has a urinary tract infection.  No associated fevers.  Suprapubic tube was exchanged today with mild encrustation on the tip of the catheter.   PMH: Past Medical History:    Diagnosis Date  . Cancer ([place])    colon  . History of kidney stones   . Hypertension   . Prostate cancer ([place])   . S/P colon resection   . Shortness of breath dyspnea   . Urinary retention     Surgical History: Past Surgical History:  Procedure Laterality Date  . ABDOMINAL SURGERY    . COLON SURGERY    . CYSTOSCOPY WITH LITHOLAPAXY N/A 06/09/2017   Procedure: CYSTOSCOPY WITH LITHOLAPAXY;  Surgeon: Hollice Espy, MD;  Location: [facility] ORS;  Service: Urology;  Laterality: N/A;  . PROSTATE SURGERY  05/25/2016  . SUPRAPUBIC CATHETER INSERTION      Home Medications:  Allergies as of 07/07/2017   No Known Allergies     Medication List        Accurate as of 07/07/17  7:48 PM. Always use your most recent med list.          carvedilol 3.125 MG tablet Commonly known as:  COREG Take 3.125 mg by mouth 2 (two) times daily.   diphenhydramine-acetaminophen 25-500 MG Tabs tablet Commonly known as:  TYLENOL PM Take 1 tablet by mouth at bedtime.   mirabegron ER 25 MG Tb24 tablet Commonly known as:  MYRBETRIQ Take 1 tablet (25 mg total) by mouth daily.   oxybutynin 5 MG tablet Commonly known as:  DITROPAN Take 1 tablet (5 mg total) by mouth 3 (three) times daily.   tamsulosin 0.4 MG Caps capsule Commonly known as:  FLOMAX TAKE 1 CAPSULE EVERY DAY       Allergies: No Known Allergies  Family History: Family History  Problem Relation Age of Onset  . Hypertension Other   . Alcohol abuse Mother   . Heart attack Father   . Cancer Brother     Social History:  reports that he has been smoking cigarettes.  He has been smoking about 0.50 packs per day. he has never used smokeless tobacco. He reports that he drinks alcohol. He reports that he does not use drugs.  ROS: UROLOGY Frequent Urination?: No Hard to postpone urination?: No Burning/pain with urination?: No Get up at night to urinate?: No Leakage of urine?: Yes Urine stream starts and stops?: No Trouble starting  stream?: No Do you have to strain to urinate?: No Blood in urine?: No Urinary tract infection?: No Sexually transmitted disease?: No Injury to kidneys or bladder?: No Painful intercourse?: No Weak stream?: No Erection problems?: No Penile pain?: Yes  Gastrointestinal Nausea?: Yes Vomiting?: No Indigestion/heartburn?: No Diarrhea?: No Constipation?: No  Constitutional Fever: No Night sweats?: Yes Weight loss?: No Fatigue?: No  Skin Skin rash/lesions?: No Itching?: No  Eyes Blurred vision?: Yes Double vision?: No  Ears/Nose/Throat Sore throat?: No Sinus problems?: No  Hematologic/Lymphatic Swollen glands?: No Easy bruising?: No  Cardiovascular Leg swelling?: Yes Chest pain?: No  Respiratory Cough?: No Shortness of breath?: No  Endocrine Excessive thirst?: No  Musculoskeletal Back pain?: No Joint pain?: No  Neurological Headaches?: Yes Dizziness?: Yes  Psychologic Depression?: Yes Anxiety?: Yes  Physical Exam: BP 121/80   Pulse 93   Ht 5\' 10"  (1.778 m)   Wt 236 lb (107 kg)   BMI 33.86 kg/m   Constitutional:  Alert and oriented, No acute distress. HEENT: [place] AT, moist mucus membranes.  Trachea midline, no masses. Cardiovascular: No clubbing, cyanosis, or edema. Respiratory: Normal respiratory effort, no increased work of breathing. GI: Abdomen is soft, nontender, nondistended, no abdominal masses, obese.  Suprapubic tube site clean dry and intact.  Foley draining pink tinged urine after exchange. GU: Normal phallus without discharge Skin: No rashes, bruises or suspicious lesions. Neurologic: Grossly intact, no focal deficits, moving all 4 extremities. Psychiatric: Normal mood and affect.  Laboratory Data: Lab Results  Component Value Date   WBC 9.2 02/13/2017   HGB 15.3 02/13/2017   HCT 43.4 02/13/2017   MCV 101.5 (H) 02/13/2017   PLT 139 (L) 02/13/2017    Lab Results  Component Value Date   CREATININE 1.05 02/13/2017    Urinalysis Results for orders placed or performed in visit on 07/07/17  Microscopic Examination  Result Value Ref Range   WBC, UA 0-5 0 - 5 /hpf   RBC, UA >30 (H) 0 - 2 /hpf   Epithelial Cells (non renal) None seen 0 - 10 /hpf   Mucus, UA Present (A) Not Estab.   Bacteria, UA Many (A) None seen/Few  Urinalysis, Complete  Result Value Ref Range   Specific Gravity, UA 1.015 1.005 - 1.030   pH, UA 6.0 5.0 - 7.5   Color, UA Red (A) Yellow   Appearance Ur Cloudy (A) Clear   Leukocytes, UA 1+ (A) Negative   Protein, UA 3+ (A) Negative/Trace   Glucose, UA Negative Negative   Ketones, UA Negative Negative   RBC, UA 3+ (A) Negative   Bilirubin, UA Negative Negative   Urobilinogen, Ur 1.0 0.2 - 1.0 mg/dL   Nitrite, UA Positive (A) Negative   Microscopic Examination See below:  Pertinent Imaging: N/a  Assessment & Plan:    1. Dysuria We will check UA/urine culture to rule out infection Treat as needed - Urinalysis, Complete - CULTURE, URINE COMPREHENSIVE  2. Bladder spasms Bladder spasms improved on Myrbetriq 25 mg He also tried oxybutynin 5 mg 3 times daily but this was less helpful We will prescribe Myrbetriq 25 mg and see if he is able to afford this medication  3. Urinary retention Chronic long-standing urinary retention Persistent after PVP Suspect some degree of neurogenic bladder at this point-agree with Dr. Bernardo Heater and recommend repeat urodynamics to evaluate bladder function in the future if he remains dissatisfied with suprapubic tube Resume clamp trials as tolerated although do not suspect the patient will be successful with these  4. Prostate cancer [facility] Managed by [facility] radiation oncology and Remains on Lupron for high risk prostate cancer    Return in about 4 weeks (around 08/04/2017) for nurse visit for SPT exchange, MD visit in 6 months.  Hollice Espy, MD  [facility] [address] [place], [place]  [address] [phone]

## 2017-07-12 ENCOUNTER — Telehealth: Payer: Self-pay

## 2017-07-12 DIAGNOSIS — N3289 Other specified disorders of bladder: Secondary | ICD-10-CM

## 2017-07-12 LAB — CULTURE, URINE COMPREHENSIVE

## 2017-07-12 MED ORDER — AMPICILLIN 500 MG PO CAPS: 500.0000 mg | ORAL_CAPSULE | Freq: Four times a day (QID) | ORAL | 0 refills | Status: DC

## 2017-07-12 MED ORDER — OXYBUTYNIN CHLORIDE 5 MG PO TABS: 5.0000 mg | ORAL_TABLET | Freq: Three times a day (TID) | ORAL | 3 refills | Status: DC

## 2017-07-12 NOTE — Telephone Encounter (Signed)
Spoke with pt in reference to +ucx and abx. Pt voiced understanding.  

## 2017-07-12 NOTE — Telephone Encounter (Signed)
-----   Message from Hollice Espy, MD sent at 07/11/2017  3:01 PM EDT ----- Symptomatic.  Lets treat with ampicillin 500 mg qid x 7 days.    Hollice Espy, MD

## 2017-07-30 ENCOUNTER — Ambulatory Visit (INDEPENDENT_AMBULATORY_CARE_PROVIDER_SITE_OTHER): Payer: Medicare HMO | Admitting: Nurse Practitioner

## 2017-07-30 ENCOUNTER — Encounter: Payer: Self-pay | Admitting: Nurse Practitioner

## 2017-07-30 VITALS — BP 150/92 | HR 74 | Resp 16 | Ht 70.5 in | Wt 238.0 lb

## 2017-07-30 DIAGNOSIS — C61 Malignant neoplasm of prostate: Secondary | ICD-10-CM

## 2017-07-30 DIAGNOSIS — R339 Retention of urine, unspecified: Secondary | ICD-10-CM

## 2017-07-30 DIAGNOSIS — Z0001 Encounter for general adult medical examination with abnormal findings: Secondary | ICD-10-CM | POA: Diagnosis not present

## 2017-07-30 DIAGNOSIS — I10 Essential (primary) hypertension: Secondary | ICD-10-CM

## 2017-07-30 DIAGNOSIS — R300 Dysuria: Secondary | ICD-10-CM

## 2017-07-30 NOTE — Progress Notes (Signed)
[facility] [place], [place] [address]  Internal MEDICINE  Office Visit Note  Patient Name: Gerald Odonnell  [id number]  [id number]  Date of Service: 08/22/2017    Pt is here for routine health maintenance examination  Chief Complaint  Patient presents with  . Annual Exam  . Hypertension     The patient is here for health maintenance exam. He is currently being treated per urology for severe renal stones. 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 Urology is telling him he will never have this reversed.    Current Medication: Outpatient Encounter Medications as of 07/30/2017  Medication Sig  . carvedilol (COREG) 3.125 MG tablet Take 3.125 mg by mouth 2 (two) times daily.   . diphenhydramine-acetaminophen (TYLENOL PM) 25-500 MG TABS tablet Take 1 tablet by mouth at bedtime.   . mirabegron ER (MYRBETRIQ) 25 MG TB24 tablet Take 1 tablet (25 mg total) by mouth daily.  Marland Kitchen oxybutynin (DITROPAN) 5 MG tablet Take 1 tablet (5 mg total) by mouth 3 (three) times daily.  . tamsulosin (FLOMAX) 0.4 MG CAPS capsule TAKE 1 CAPSULE EVERY DAY  . [DISCONTINUED] ampicillin (PRINCIPEN) 500 MG capsule Take 1 capsule (500 mg total) by mouth 4 (four) times daily. (Patient not taking: Reported on 07/30/2017)   No facility-administered encounter medications on file as of 07/30/2017.     Surgical History: Past Surgical History:  Procedure Laterality Date  . ABDOMINAL SURGERY    . COLON SURGERY    . CYSTOSCOPY WITH LITHOLAPAXY N/A 06/09/2017   Procedure: CYSTOSCOPY WITH LITHOLAPAXY;  Surgeon: Hollice Espy, MD;  Location: [facility] ORS;  Service: Urology;  Laterality: N/A;  . PROSTATE SURGERY  05/25/2016  . SUPRAPUBIC CATHETER INSERTION      Medical History: Past Medical History:  Diagnosis Date  . Cancer ([place])    colon  . History of kidney  stones   . Hypertension   . Prostate cancer ([place])   . S/P colon resection   . Shortness of breath dyspnea   . Urinary retention     Family History: Family History  Problem Relation Age of Onset  . Hypertension Other   . Alcohol abuse Mother   . Heart attack Father   . Cancer Brother       Review of Systems  Constitutional: Negative for activity change, chills, fatigue and unexpected weight change.  HENT: Negative for congestion, postnasal drip, rhinorrhea, sneezing, sore throat and voice change.   Eyes: Negative.  Negative for redness.  Respiratory: Negative for cough, chest tightness and shortness of breath.   Cardiovascular: Negative for chest pain and palpitations.  Gastrointestinal: Negative for abdominal pain, constipation, diarrhea, nausea and vomiting.  Endocrine: Negative for cold intolerance, heat intolerance, polydipsia, polyphagia and polyuria.  Genitourinary: Positive for frequency and urgency. Negative for dysuria.  Musculoskeletal: Negative for arthralgias, back pain, joint swelling and neck pain.  Skin: Negative for rash.  Allergic/Immunologic: Negative for environmental allergies.  Neurological: Negative for dizziness, tremors, weakness, numbness and headaches.  Hematological: Negative for adenopathy. Does not bruise/bleed easily.  Psychiatric/Behavioral: Negative for behavioral problems (Depression), sleep disturbance and suicidal ideas. The patient is not nervous/anxious.      Today's Vitals   07/30/17 1113  BP: (!) 150/92  Pulse: 74  Resp: 16  SpO2: 99%  Weight: 238 lb (108 kg)  Height: 5' 10.5" (1.791  m)    Physical Exam  Constitutional: He is oriented to person, place, and time. He appears well-developed and well-nourished. No distress.  HENT:  Head: Normocephalic and atraumatic.  Mouth/Throat: Oropharynx is clear and moist. No oropharyngeal exudate.  Eyes: Pupils are equal, round, and reactive to light. EOM are normal.  Neck: Normal range of  motion. Neck supple. No JVD present. Carotid bruit is not present. No tracheal deviation present. No thyromegaly present.  Cardiovascular: Normal rate, regular rhythm, normal heart sounds and intact distal pulses. Exam reveals no gallop and no friction rub.  No murmur heard. Pulmonary/Chest: Effort normal and breath sounds normal. No respiratory distress. He has no wheezes. He has no rales. He exhibits no tenderness.  Abdominal: Soft. Bowel sounds are normal. There is no tenderness.  Musculoskeletal: Normal range of motion.  Lymphadenopathy:    He has no cervical adenopathy.  Neurological: He is alert and oriented to person, place, and time. No cranial nerve deficit.  Skin: Skin is warm and dry. He is not diaphoretic.  Psychiatric: He has a normal mood and affect. His behavior is normal. Judgment and thought content normal.  Nursing note and vitals reviewed.    LABS: Recent Results (from the past 2160 hour(s))  Urinalysis, Complete     Status: Abnormal   Collection Time: 05/25/17  9:51 AM  Result Value Ref Range   Specific Gravity, UA 1.005 1.005 - 1.030   pH, UA 6.0 5.0 - 7.5   Color, UA Yellow Yellow   Appearance Ur Turbid (A) Clear   Leukocytes, UA 1+ (A) Negative   Protein, UA Negative Negative/Trace   Glucose, UA Negative Negative   Ketones, UA Negative Negative   RBC, UA Negative Negative   Bilirubin, UA Negative Negative   Urobilinogen, Ur 0.2 0.2 - 1.0 mg/dL   Nitrite, UA Positive (A) Negative   Microscopic Examination See below:   Microscopic Examination     Status: Abnormal   Collection Time: 05/25/17  9:51 AM  Result Value Ref Range   WBC, UA 6-10 (A) 0 - 5 /hpf   RBC, UA 0-2 0 - 2 /hpf   Epithelial Cells (non renal) 0-10 0 - 10 /hpf   Renal Epithel, UA 0-10 None seen /hpf   Crystals Present (A) N/A   Crystal Type Amorphous Sediment N/A   Bacteria, UA Many (A) None seen/Few  CULTURE, URINE COMPREHENSIVE     Status: Abnormal   Collection Time: 05/25/17 11:01 AM   Result Value Ref Range   Urine Culture, Comprehensive Final report (A)    Organism ID, Bacteria Comment (A)     Comment: Enterobacter cloacae complex Greater than 100,000 colony forming units per mL    ANTIMICROBIAL SUSCEPTIBILITY Comment     Comment:       ** S = Susceptible; I = Intermediate; R = Resistant **                    P = Positive; N = Negative             MICS are expressed in micrograms per mL    Antibiotic                 RSLT#1    RSLT#2    RSLT#3    RSLT#4 Amoxicillin/Clavulanic Acid    R Cefazolin                      R Cefepime  S Cefuroxime                     R Ciprofloxacin                  S Ertapenem                      S Gentamicin                     S Imipenem                       S Levofloxacin                   S Meropenem                      S Nitrofurantoin                 I Tetracycline                   S Tobramycin                     S Trimethoprim/Sulfa             S   Urinalysis, Complete     Status: Abnormal   Collection Time: 07/07/17  9:34 AM  Result Value Ref Range   Specific Gravity, UA 1.015 1.005 - 1.030   pH, UA 6.0 5.0 - 7.5   Color, UA Red (A) Yellow   Appearance Ur Cloudy (A) Clear   Leukocytes, UA 1+ (A) Negative   Protein, UA 3+ (A) Negative/Trace   Glucose, UA Negative Negative   Ketones, UA Negative Negative   RBC, UA 3+ (A) Negative   Bilirubin, UA Negative Negative   Urobilinogen, Ur 1.0 0.2 - 1.0 mg/dL   Nitrite, UA Positive (A) Negative   Microscopic Examination See below:   Microscopic Examination     Status: Abnormal   Collection Time: 07/07/17  9:34 AM  Result Value Ref Range   WBC, UA 0-5 0 - 5 /hpf   RBC, UA >30 (H) 0 - 2 /hpf   Epithelial Cells (non renal) None seen 0 - 10 /hpf   Mucus, UA Present (A) Not Estab.   Bacteria, UA Many (A) None seen/Few  CULTURE, URINE COMPREHENSIVE     Status: Abnormal   Collection Time: 07/07/17  9:45 AM  Result Value Ref Range   Urine Culture,  Comprehensive Final report (A)    Organism ID, Bacteria Enterococcus faecalis (A)     Comment: Greater than 100,000 colony forming units per mL Note: this isolate is vancomycin-susceptible. This information is provided for epidemiologic purposes only: vancomycin is not among the antibiotics recommended for therapy of urinary tract infections caused by Enterococcus.    Organism ID, Bacteria Comment (A)     Comment: Staphylococcus epidermidis Greater than 100,000 colony forming units per mL Based on resistance to oxacillin this isolate would be resistant to all currently available beta-lactam antimicrobial agents, with the exception of the newer cephalosporins with anti-MRSA activity, such as Ceftaroline    ANTIMICROBIAL SUSCEPTIBILITY Comment     Comment:       ** S = Susceptible; I = Intermediate; R = Resistant **                    P = Positive; N =  Negative             MICS are expressed in micrograms per mL    Antibiotic                 RSLT#1    RSLT#2    RSLT#3    RSLT#4 Ciprofloxacin                  S         R Gentamicin                               S Levofloxacin                   S         R Linezolid                                S Nitrofurantoin                 R         S Oxacillin                                R Penicillin                     S         R Quinupristin/Dalfopristin                S Rifampin                                 S Tetracycline                   R         S Trimethoprim/Sulfa                       R Vancomycin                     S         S   Urinalysis, Routine w reflex microscopic     Status: Abnormal   Collection Time: 07/30/17  3:01 PM  Result Value Ref Range   Specific Gravity, UA 1.008 1.005 - 1.030   pH, UA 8.0 (H) 5.0 - 7.5   Color, UA Yellow Yellow   Appearance Ur Cloudy (A) Clear   Leukocytes, UA 3+ (A) Negative   Protein, UA Negative Negative/Trace   Glucose, UA Negative Negative   Ketones, UA Negative Negative   RBC,  UA Negative Negative   Bilirubin, UA Negative Negative   Urobilinogen, Ur 0.2 0.2 - 1.0 mg/dL   Nitrite, UA Negative Negative   Microscopic Examination See below:     Comment: Microscopic was indicated and was performed.  Microscopic Examination     Status: Abnormal   Collection Time: 07/30/17  3:01 PM  Result Value Ref Range   WBC, UA 11-30 (A) 0 - 5 /hpf   RBC, UA 0-2 0 - 2 /hpf   Epithelial Cells (non renal) None seen 0 - 10 /hpf   Casts None seen None seen /lpf   Bacteria, UA Few None seen/Few    Assessment/Plan:  1. Encounter for general adult medical examination with abnormal findings Annual wellness visit today  2. Benign hypertension Generally stable. Continue BP medications as prescribed   3. Dysuria - Urinalysis, Routine w reflex microscopic  4. Prostate cancer [facility]) Continue regular visits with urology/oncology as scheduled.   5. Incomplete bladder emptying Continue myrbetriq as prescribed.   General Counseling: epifanio labrador understanding of the findings of todays visit and agrees with plan of treatment. I have discussed any further diagnostic evaluation that may be needed or ordered today. We also reviewed his medications today. he has been encouraged to call the office with any questions or concerns that should arise related to todays visit.  This patient was seen by Leretha Pol, FNP- C in Collaboration with Dr Lavera Guise as a part of collaborative care agreement    Orders Placed This Encounter  Procedures  . Microscopic Examination  . Urinalysis, Routine w reflex microscopic      Time spent:30 Minutes      Lavera Guise, MD  Internal Medicine

## 2017-07-31 LAB — MICROSCOPIC EXAMINATION
Casts: NONE SEEN /lpf
EPITHELIAL CELLS (NON RENAL): NONE SEEN /HPF (ref 0–10)

## 2017-07-31 LAB — URINALYSIS, ROUTINE W REFLEX MICROSCOPIC
BILIRUBIN UA: NEGATIVE
Glucose, UA: NEGATIVE
Ketones, UA: NEGATIVE
Nitrite, UA: NEGATIVE
PH UA: 8 — AB (ref 5.0–7.5)
Protein, UA: NEGATIVE
RBC UA: NEGATIVE
Specific Gravity, UA: 1.008 (ref 1.005–1.030)
Urobilinogen, Ur: 0.2 mg/dL (ref 0.2–1.0)

## 2017-08-11 ENCOUNTER — Ambulatory Visit (INDEPENDENT_AMBULATORY_CARE_PROVIDER_SITE_OTHER): Payer: Medicare HMO | Admitting: Family Medicine

## 2017-08-11 VITALS — BP 129/78 | HR 90 | Ht 70.5 in | Wt 240.9 lb

## 2017-08-11 DIAGNOSIS — N312 Flaccid neuropathic bladder, not elsewhere classified: Secondary | ICD-10-CM

## 2017-08-11 NOTE — Progress Notes (Signed)
Suprapubic Cath Change  Patient is present today for a suprapubic catheter change due to urinary retention.  24ml of water was drained from the balloon, a 16FR foley cath was removed from the tract with out difficulty.  Site was cleaned and prepped in a sterile fashion with betadine.  A 16FR foley cath was replaced into the tract no complications were noted. Urine return was noted, 10 ml of sterile water was inflated into the balloon and a leg bag was attached for drainage.  Patient tolerated well.   Preformed by: Elberta Leatherwood, CMA  Follow up: 4 weeks

## 2017-08-22 DIAGNOSIS — I10 Essential (primary) hypertension: Secondary | ICD-10-CM | POA: Insufficient documentation

## 2017-08-22 DIAGNOSIS — R300 Dysuria: Secondary | ICD-10-CM | POA: Insufficient documentation

## 2017-08-22 DIAGNOSIS — Z1211 Encounter for screening for malignant neoplasm of colon: Secondary | ICD-10-CM | POA: Insufficient documentation

## 2017-08-22 DIAGNOSIS — Z7189 Other specified counseling: Secondary | ICD-10-CM | POA: Insufficient documentation

## 2017-09-08 ENCOUNTER — Ambulatory Visit: Payer: Medicare HMO

## 2017-09-10 ENCOUNTER — Ambulatory Visit (INDEPENDENT_AMBULATORY_CARE_PROVIDER_SITE_OTHER): Payer: Medicare HMO | Admitting: Family Medicine

## 2017-09-10 VITALS — BP 144/94 | HR 79 | Ht 70.0 in | Wt 238.0 lb

## 2017-09-10 DIAGNOSIS — R339 Retention of urine, unspecified: Secondary | ICD-10-CM

## 2017-09-10 NOTE — Progress Notes (Signed)
Suprapubic Cath Change  Patient is present today for a suprapubic catheter change due to urinary retention.  34ml of water was drained from the balloon, a 16FR Foley  cath was removed from the tract with out difficulty.  Site was cleaned and prepped in a sterile fashion with betadine.  A 16 Coude cath was replaced into the tract no complications were noted. Urine return was noted, 10 ml of sterile water was inflated into the balloon and a leg bag was attached for drainage.  Patient tolerated well. A night bag was given to patient and proper instruction was given on how to switch bags.    Preformed by: Gordy Clement, CMA  Follow up: In one month as scheduled

## 2017-09-10 NOTE — Addendum Note (Signed)
Addended by: Donalee Citrin on: 09/10/2017 09:11 AM   Modules accepted: Level of Service

## 2017-09-14 ENCOUNTER — Telehealth: Payer: Self-pay | Admitting: Urology

## 2017-09-14 DIAGNOSIS — N3289 Other specified disorders of bladder: Secondary | ICD-10-CM

## 2017-09-14 MED ORDER — OXYBUTYNIN CHLORIDE 5 MG PO TABS: 5.0000 mg | ORAL_TABLET | Freq: Three times a day (TID) | ORAL | 3 refills | Status: DC

## 2017-09-14 NOTE — Telephone Encounter (Signed)
Pt called office stating he was seen by nurse on 09/10/2017 and was told his Rx would be sent in to his pharmacy, [facility]. Pt thinks Rx is Oxybututinin. Pt calling to follow up, no Rx was sent. Please advise. Thanks.

## 2017-09-14 NOTE — Addendum Note (Signed)
Addended by: Kyra Manges on: 09/14/2017 01:27 PM   Modules accepted: Orders

## 2017-09-22 ENCOUNTER — Telehealth: Payer: Self-pay

## 2017-09-22 NOTE — Telephone Encounter (Signed)
FAXED MEDICAL RECORD REQUEST ON 09-22-17 

## 2017-09-27 ENCOUNTER — Ambulatory Visit (INDEPENDENT_AMBULATORY_CARE_PROVIDER_SITE_OTHER): Payer: Medicare HMO

## 2017-09-27 VITALS — BP 118/72 | HR 105 | Resp 16 | Ht 70.0 in | Wt 249.0 lb

## 2017-09-27 DIAGNOSIS — N3289 Other specified disorders of bladder: Secondary | ICD-10-CM

## 2017-09-27 NOTE — Progress Notes (Signed)
Bladder Irrigation  Due to SPT pain, patient is present today for a bladder irrigation. Patient was cleaned and prepped in a sterile fashion. 30 ml of saline/sterile water was instilled and irrigated into the bladder with a 11ml [place] syringe through the catheter in place.  After irrigation urine flow was noted no complications were noted catheter is now draining fine.  Catheter was reattached to the leg bag for drainage. Patient tolerated well.   Preformed by: Cristie Hem, CMA Additional notes/ Follow up: Pt was told that Per Dr. Bernardo Heater he needs to stop Oxybutynin and try 1 month supply of Myrbetriq. Pt acknowledged and will start Myrbetriq today.

## 2017-10-11 ENCOUNTER — Ambulatory Visit (INDEPENDENT_AMBULATORY_CARE_PROVIDER_SITE_OTHER): Payer: Medicare HMO

## 2017-10-11 DIAGNOSIS — R339 Retention of urine, unspecified: Secondary | ICD-10-CM | POA: Diagnosis not present

## 2017-10-11 NOTE — Progress Notes (Signed)
Suprapubic Cath Change  Patient is present today for a suprapubic catheter change due to urinary retention.  72ml of water was drained from the balloon, a 16FR foley cath was removed from the tract with out difficulty.  Site was cleaned and prepped in a sterile fashion with betadine.  A 16FR foley cath was replaced into the tract complications were noted as: encrustation on catheter when removed, blood noted at site . Patient states he has been having worsening bladder spasms and the Myrbetriq given is not helping. Patient was given instruction and supplies on how to irrigate to help with encrustation and better draining. Patient instructed to irrigate daily and make sure cath continues to drain to help avoid spasms. . Urine return was noted, 10 ml of sterile water was inflated into the balloon and a leg bag was attached for drainage.  Patient tolerated well. A night bag was given to patient and proper instruction was given on how to switch bags.    Preformed by: Fonnie Jarvis, CMA  Follow up: 1 month

## 2017-10-13 ENCOUNTER — Telehealth: Payer: Self-pay | Admitting: Urology

## 2017-10-13 NOTE — Telephone Encounter (Signed)
Pt called office stating he had his SPT changed earlier this week. Pt would like to the assistant that changed his SPT, he states that he thinks he's figured out a way to control the pain but wants to discuss this with medical advice first. Please advise. Thanks.

## 2017-10-14 NOTE — Telephone Encounter (Signed)
Left pt mess to call 

## 2017-10-27 ENCOUNTER — Emergency Department: Payer: Self-pay

## 2017-10-27 ENCOUNTER — Encounter: Payer: Self-pay | Admitting: Emergency Medicine

## 2017-10-27 ENCOUNTER — Emergency Department
Admission: EM | Admit: 2017-10-27 | Discharge: 2017-10-27 | Disposition: A | Discharge status: Home / Self Care | Payer: Self-pay | Attending: Emergency Medicine | Admitting: Emergency Medicine

## 2017-10-27 DIAGNOSIS — Z8546 Personal history of malignant neoplasm of prostate: Secondary | ICD-10-CM | POA: Insufficient documentation

## 2017-10-27 DIAGNOSIS — N3090 Cystitis, unspecified without hematuria: Secondary | ICD-10-CM | POA: Insufficient documentation

## 2017-10-27 DIAGNOSIS — Y929 Unspecified place or not applicable: Secondary | ICD-10-CM | POA: Insufficient documentation

## 2017-10-27 DIAGNOSIS — W19XXXA Unspecified fall, initial encounter: Secondary | ICD-10-CM | POA: Insufficient documentation

## 2017-10-27 DIAGNOSIS — Z85038 Personal history of other malignant neoplasm of large intestine: Secondary | ICD-10-CM | POA: Insufficient documentation

## 2017-10-27 DIAGNOSIS — S20212A Contusion of left front wall of thorax, initial encounter: Secondary | ICD-10-CM | POA: Insufficient documentation

## 2017-10-27 DIAGNOSIS — Y939 Activity, unspecified: Secondary | ICD-10-CM | POA: Insufficient documentation

## 2017-10-27 DIAGNOSIS — Y999 Unspecified external cause status: Secondary | ICD-10-CM | POA: Insufficient documentation

## 2017-10-27 HISTORY — DX: Other cystostomy status: Z93.59

## 2017-10-27 HISTORY — DX: Malignant neoplasm of colon, unspecified: C18.9

## 2017-10-27 LAB — COMPREHENSIVE METABOLIC PANEL
ALBUMIN: 4.2 g/dL (ref 3.5–5.0)
ALT: 26 U/L (ref 0–44)
AST: 37 U/L (ref 15–41)
Alkaline Phosphatase: 204 U/L — ABNORMAL HIGH (ref 38–126)
Anion gap: 8 (ref 5–15)
BUN: 8 mg/dL (ref 8–23)
CHLORIDE: 109 mmol/L (ref 98–111)
CO2: 25 mmol/L (ref 22–32)
CREATININE: 0.57 mg/dL — AB (ref 0.61–1.24)
Calcium: 9.3 mg/dL (ref 8.9–10.3)
GFR calc Af Amer: >60 mL/min (ref >60)
GLUCOSE: 134 mg/dL — AB (ref 70–99)
POTASSIUM: 4.2 mmol/L (ref 3.5–5.1)
Sodium: 142 mmol/L (ref 135–145)
Total Bilirubin: 1.7 mg/dL — ABNORMAL HIGH (ref 0.3–1.2)
Total Protein: 7.9 g/dL (ref 6.5–8.1)

## 2017-10-27 LAB — URINALYSIS, COMPLETE (UACMP) WITH MICROSCOPIC
BILIRUBIN URINE: NEGATIVE
GLUCOSE, UA: NEGATIVE mg/dL
HGB URINE DIPSTICK: NEGATIVE
KETONES UR: 5 mg/dL — AB
Nitrite: NEGATIVE
PROTEIN: 100 mg/dL — AB
Specific Gravity, Urine: 1.016 (ref 1.005–1.030)
pH: 8 (ref 5.0–8.0)

## 2017-10-27 LAB — CBC WITH DIFFERENTIAL/PLATELET
BASOS ABS: 0.1 10*3/uL (ref 0–0.1)
BASOS PCT: 1 %
Eosinophils Absolute: 0 10*3/uL (ref 0–0.7)
Eosinophils Relative: 0 %
HEMATOCRIT: 45.5 % (ref 40.0–52.0)
Hemoglobin: 16 g/dL (ref 13.0–18.0)
LYMPHS PCT: 12 %
Lymphs Abs: 1.2 10*3/uL (ref 1.0–3.6)
MCH: 36.4 pg — ABNORMAL HIGH (ref 26.0–34.0)
MCHC: 35.2 g/dL (ref 32.0–36.0)
MCV: 103.5 fL — AB (ref 80.0–100.0)
MONO ABS: 0.9 10*3/uL (ref 0.2–1.0)
Monocytes Relative: 9 %
NEUTROS ABS: 7.8 10*3/uL — AB (ref 1.4–6.5)
Neutrophils Relative %: 78 %
PLATELETS: 143 10*3/uL — AB (ref 150–440)
RBC: 4.4 MIL/uL (ref 4.40–5.90)
RDW: 13.4 % (ref 11.5–14.5)
WBC: 10 10*3/uL (ref 3.8–10.6)

## 2017-10-27 LAB — LIPASE, BLOOD: Lipase: 30 U/L (ref 11–51)

## 2017-10-27 MED ORDER — OXYCODONE-ACETAMINOPHEN 5-325 MG PO TABS: 1.0000 | ORAL_TABLET | Freq: Three times a day (TID) | ORAL | 0 refills | Status: DC | PRN

## 2017-10-27 MED ORDER — CIPROFLOXACIN HCL 500 MG PO TABS
500.0000 mg | ORAL_TABLET | Freq: Once | ORAL | Status: AC
  Administered 2017-10-27: 500 mg via ORAL
  Filled 2017-10-27: qty 1

## 2017-10-27 MED ORDER — OXYCODONE-ACETAMINOPHEN 5-325 MG PO TABS
2.0000 | ORAL_TABLET | Freq: Once | ORAL | Status: AC
  Administered 2017-10-27: 2 via ORAL
  Filled 2017-10-27: qty 2

## 2017-10-27 MED ORDER — CIPROFLOXACIN HCL 500 MG PO TABS: 500.0000 mg | ORAL_TABLET | Freq: Two times a day (BID) | ORAL | 0 refills | Status: AC

## 2017-10-27 NOTE — ED Provider Notes (Signed)
[facility] Emergency Department Provider Note       Time seen: ----------------------------------------- 11:07 AM on 10/27/2017 -----------------------------------------   I have reviewed the triage vital signs and the nursing notes.  HISTORY   Chief Complaint Fall; Hematuria; and Leg Swelling    HPI Gerald Odonnell is a 62 y.o. male with a history of colon cancer, prostate cancer, chronic suprapubic catheter who presents to the ED for a fall that occurred yesterday.  Patient states since the fall he has had hematuria.  Typically he has intermittent pain he thinks is coming from his bladder.  He has been told by his urologist that he is having bladder spasms.  Patient reports these are worse than normal, he is also having left rib pain from the fall.  His Foley catheter is changed out every month and was changed out 2 weeks ago.  Past Medical History:  Diagnosis Date  . Colon cancer ([place])    Hx of  . Prostate cancer ([place])    Hx of   . Suprapubic catheter ([place])     There are no active problems to display for this patient.   History reviewed. No pertinent surgical history.  Allergies Patient has no allergy information on record.  Social History Social History   Tobacco Use  . Smoking status: Not on file  Substance Use Topics  . Alcohol use: Not on file  . Drug use: Not on file   Review of Systems Constitutional: Negative for fever. Cardiovascular: Negative for chest pain. Respiratory: Negative for shortness of breath. Gastrointestinal: Negative for abdominal pain, vomiting and diarrhea. Genitourinary: Positive for penile pain, hematuria Musculoskeletal: Positive for leg pain, edema Skin: Negative for rash. Neurological: Negative for headaches, focal weakness or numbness.  All systems negative/normal/unremarkable except as stated in the HPI  ____________________________________________   PHYSICAL EXAM:  VITAL SIGNS: ED Triage Vitals   Enc Vitals Group     BP 10/27/17 0936 (!) 153/87     Pulse Rate 10/27/17 0936 (!) 104     Resp 10/27/17 0936 16     Temp 10/27/17 0936 99 F (37.2 C)     Temp Source 10/27/17 0936 Oral     SpO2 10/27/17 0936 96 %     Weight 10/27/17 0937 240 lb (108.9 kg)     Height 10/27/17 0937 5' 10.5" (1.791 m)     Head Circumference --      Peak Flow --      Pain Score 10/27/17 0936 10     Pain Loc --      Pain Edu? --      Excl. in [place]? --    Constitutional: Alert and oriented. Well appearing and in no distress. Eyes: Conjunctivae are normal. Normal extraocular movements. Cardiovascular: Normal rate, regular rhythm. No murmurs, rubs, or gallops. Respiratory: Normal respiratory effort without tachypnea nor retractions. Breath sounds are clear and equal bilaterally. No wheezes/rales/rhonchi. Gastrointestinal: Soft and nontender. Normal bowel sounds Musculoskeletal: Nontender with normal range of motion in extremities.  Mild edema is noted.  Left-sided chest wall tenderness and rib tenderness is noted Neurologic:  Normal speech and language. No gross focal neurologic deficits are appreciated.  Skin: Left sided chest wall contusions are noted Psychiatric: Mood and affect are normal. Speech and behavior are normal.  ____________________________________________  ED COURSE:  As part of my medical decision making, I reviewed the following data within the [place] History obtained from family if available, nursing notes, old chart and  ekg, as well as notes from prior ED visits. Patient presented for a fall with also complaints of hematuria, we will assess with labs and imaging as indicated at this time.   Procedures ____________________________________________   LABS (pertinent positives/negatives)  Labs Reviewed  CBC WITH DIFFERENTIAL/PLATELET - Abnormal; Notable for the following components:      Result Value   MCV 103.5 (*)    MCH 36.4 (*)    Platelets 143 (*)    Neutro  Abs 7.8 (*)    All other components within normal limits  COMPREHENSIVE METABOLIC PANEL - Abnormal; Notable for the following components:   Glucose, Bld 134 (*)    Creatinine, Ser 0.57 (*)    Alkaline Phosphatase 204 (*)    Total Bilirubin 1.7 (*)    All other components within normal limits  URINALYSIS, COMPLETE (UACMP) WITH MICROSCOPIC - Abnormal; Notable for the following components:   Color, Urine AMBER (*)    APPearance CLOUDY (*)    Ketones, ur 5 (*)    Protein, ur 100 (*)    Leukocytes, UA SMALL (*)    WBC, UA >50 (*)    Bacteria, UA RARE (*)    All other components within normal limits  URINE CULTURE  LIPASE, BLOOD    RADIOLOGY Images were viewed by me  Left rib x-rays IMPRESSION: Normal left ribs. Central pulmonary vascular congestion. Minimal right basilar subsegmental atelectasis.  ____________________________________________  DIFFERENTIAL DIAGNOSIS   UTI, hematoma, contusion, rib fracture, peripheral edema  FINAL ASSESSMENT AND PLAN  Cystitis, rib contusion   Plan: The patient had presented for fall with hematuria and chronic suprapubic catheter. Patient's labs do indicate some degree of urinary tract infection although he has a chronic indwelling Foley catheter.  We have sent a urine culture. Patient's imaging not reveal any acute process, specifically any rib fracture.  He is cleared for outpatient follow-up with antibiotics and pain medicine.   Laurence Aly, MD   Note: This note was generated in part or whole with voice recognition software. Voice recognition is usually quite accurate but there are transcription errors that can and very often do occur. I apologize for any typographical errors that were not detected and corrected.     Earleen Newport, MD 10/27/17 1300

## 2017-10-27 NOTE — ED Notes (Addendum)
First Nurse Note:  Patient states he fell yesterday and has bruising left side of chest.  Has a suprapubic catheter and now has blood in his urine, states he was up 10X last night.

## 2017-10-27 NOTE — ED Triage Notes (Signed)
Pt reports has a suprapubic catheter. Pt states that he fell yesterday and has been draining blood since. Pt also reports severe pain to his penis and swelling to his legs.

## 2017-10-28 LAB — URINE CULTURE: Special Requests: NORMAL

## 2017-11-10 ENCOUNTER — Ambulatory Visit: Payer: Medicare HMO

## 2017-11-15 ENCOUNTER — Other Ambulatory Visit: Payer: Self-pay

## 2017-11-15 ENCOUNTER — Ambulatory Visit (INDEPENDENT_AMBULATORY_CARE_PROVIDER_SITE_OTHER): Payer: Medicare HMO

## 2017-11-15 DIAGNOSIS — R339 Retention of urine, unspecified: Secondary | ICD-10-CM

## 2017-11-15 NOTE — Progress Notes (Signed)
Suprapubic Cath Change  Patient is present today for a suprapubic catheter change due to urinary retention.  58ml of water was drained from the balloon, a 16FR foley cath was removed from the tract with out difficulty.  Site was cleaned and prepped in a sterile fashion with betadine.  A 16FR foley cath was replaced into the tract no complications were noted. Urine return was noted, 10 ml of sterile water was inflated into the balloon and a leg bag was attached for drainage.  Patient tolerated well. A night bag was given to patient and proper instruction was given on how to switch bags.    Preformed by: Cristie Hem, CMA   Follow up: 1 month

## 2017-12-15 ENCOUNTER — Ambulatory Visit (INDEPENDENT_AMBULATORY_CARE_PROVIDER_SITE_OTHER): Payer: Medicare HMO

## 2017-12-15 DIAGNOSIS — R339 Retention of urine, unspecified: Secondary | ICD-10-CM

## 2017-12-15 NOTE — Progress Notes (Signed)
Suprapubic Cath Change  Patient is present today for a suprapubic catheter change due to urinary retention.  42ml of water was drained from the balloon, a 16FR foley cath was removed from the tract with out difficulty.  Site was cleaned and prepped in a sterile fashion with betadine.  A 16FR foley cath was replaced into the tract no complications were noted. Urine return was noted, 10 ml of sterile water was inflated into the balloon and a leg bag was attached for drainage.  Patient tolerated well. A night bag was given to patient and proper instruction was given on how to switch bags.    Preformed by: Cristie Hem, CMA  Follow up: As Scheduled

## 2018-01-11 ENCOUNTER — Ambulatory Visit (INDEPENDENT_AMBULATORY_CARE_PROVIDER_SITE_OTHER): Payer: Medicare HMO | Admitting: Urology

## 2018-01-11 ENCOUNTER — Encounter: Payer: Self-pay | Admitting: Urology

## 2018-01-11 ENCOUNTER — Telehealth: Payer: Self-pay | Admitting: Urology

## 2018-01-11 VITALS — BP 142/82 | HR 98 | Ht 70.0 in | Wt 233.0 lb

## 2018-01-11 DIAGNOSIS — C61 Malignant neoplasm of prostate: Secondary | ICD-10-CM | POA: Diagnosis not present

## 2018-01-11 DIAGNOSIS — N312 Flaccid neuropathic bladder, not elsewhere classified: Secondary | ICD-10-CM | POA: Diagnosis not present

## 2018-01-11 DIAGNOSIS — N3289 Other specified disorders of bladder: Secondary | ICD-10-CM

## 2018-01-11 NOTE — Progress Notes (Signed)
01/11/2018 6:23 PM   Gerald Odonnell 10/18/1954 [id number]  Referring provider: Ronnell Freshwater, NP [address] [place], [place] [address]  Chief Complaint  Patient presents with  . 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 He was treated by radiation oncology with EB RT with brachy boost as well as Lupron. TRUS volume 23 cc. Most recent PSA on 07/2016 1.17.He was followed by radiation oncology at [facility] remains on Lupron, last injection 12/2016.  He has not been seen since.  No PSA since above.    He is been a chronic urinary retention managed with an indwelling suprapubic tube since that time. In 2015, he underwent urodynamics which showed evidence of outlet obstruction. He was taken to the operating room by Dr. Bernardo Heater for PVP. He voided a few times after that but then developed recurrent retention. He is unable to self cath or tolerate an indwelling penile Foley. He is been managed with a suprapubic tube.  He did undergo cystolitholapaxy on 06/09/2017 with no further issues with encrustation of his tubes.  He continues to desire today to continue clamp trials.  He claps his suprapubic tube on a nearly daily basis for a few hours and then stands to void.  He eventually unclamped this to at least once a day and overnight.  He is satisfied with this arrangement as he feels more like a "man" being able to void spontaneously.  He does note bladder and penile burning with voiding as per previous occasions which is chronic.  He continues both Flomax as well as Myrbetriq 25 mg for his "voiding "symptoms.   PMH: Past Medical History:  Diagnosis Date  . Cancer ([place])    colon  . Colon cancer ([place])    Hx of  .  History of kidney stones   . Hypertension   . Prostate cancer ([place])   . Prostate cancer ([place])    Hx of   . S/P colon resection   . Shortness of breath dyspnea   . Suprapubic catheter ([place])   . Urinary retention     Surgical History: Past Surgical History:  Procedure Laterality Date  . ABDOMINAL SURGERY    . COLON SURGERY    . CYSTOSCOPY WITH LITHOLAPAXY N/A 06/09/2017   Procedure: CYSTOSCOPY WITH LITHOLAPAXY;  Surgeon: Hollice Espy, MD;  Location: [facility] ORS;  Service: Urology;  Laterality: N/A;  . PROSTATE SURGERY  05/25/2016  . SUPRAPUBIC CATHETER INSERTION      Home Medications:  Allergies as of 01/11/2018   No Known Allergies     Medication List        Accurate as of 01/11/18  6:23 PM. Always use your most recent med list.          carvedilol 3.125 MG tablet Commonly known as:  COREG Take 3.125 mg by mouth 2 (two) times daily.   diphenhydramine-acetaminophen 25-500 MG Tabs tablet Commonly known as:  TYLENOL PM Take 1 tablet by mouth at bedtime.   furosemide 20 MG tablet Commonly known as:  LASIX Take by mouth.   mirabegron ER 25 MG Tb24 tablet Commonly known as:  MYRBETRIQ Take 1 tablet (25 mg total) by mouth daily.   oxybutynin 5 MG tablet Commonly known as:  DITROPAN Take 1 tablet (5 mg total) by mouth 3 (three) times daily.   oxyCODONE-acetaminophen 5-325 MG tablet Commonly known as:  PERCOCET/ROXICET Take 1 tablet by mouth every 8 (eight) hours as needed for severe pain.   tamsulosin 0.4 MG Caps capsule Commonly known as:  FLOMAX TAKE 1 CAPSULE EVERY DAY       Allergies: No Known Allergies  Family History: Family History  Problem Relation Age of Onset  . Hypertension Other   . Alcohol abuse Mother   . Heart attack Father   . Cancer Brother     Social History:  reports that he has been smoking cigarettes. He has been smoking about 0.50 packs per day. He has never used smokeless tobacco. He reports that he drinks alcohol. He reports  that he does not use drugs.  ROS: See EPIC  Physical Exam: BP (!) 142/82   Pulse 98   Ht 5\' 10"  (1.778 m)   Wt 233 lb (105.7 kg)   BMI 33.43 kg/m   Constitutional:  Alert and oriented, No acute distress. HEENT: [place] AT, moist mucus membranes.  Trachea midline, no masses. Cardiovascular: No clubbing, cyanosis, or edema. Respiratory: Normal respiratory effort, no increased work of breathing. Skin: No rashes, bruises or suspicious lesions. Neurologic: Grossly intact, no focal deficits, moving all 4 extremities. Psychiatric: Normal mood and affect.  Laboratory Data: Lab Results  Component Value Date   WBC 10.0 10/27/2017   HGB 16.0 10/27/2017   HCT 45.5 10/27/2017   MCV 103.5 (H) 10/27/2017   PLT 143 (L) 10/27/2017    Lab Results  Component Value Date   CREATININE 0.57 (L) 10/27/2017    Urinalysis N/a  Pertinent Imaging: N/a  Assessment & Plan:    1. Prostate cancer [facility]) Status post radiation and ADT x 2 years dx 2017 He does not appear to be followed by [facility] anymore, will assume cancer surveillance, PSA today Recommend PSA every 6 months - PSA  2. Hypotonic bladder Management chronic indwelling suprapubic tube Patient desires to continue daytime clamp trials which is fine as long as he opens his catheter at least 1-2 times daily Continue Flomax as this may be beneficial Cystoscopy in 6 months  3. Bladder spasms Continue Myrbetriq 25 mg for bladder spasms    Return in about 6 months (around 07/12/2018) for PSA/ cystoscopy (contine monthly MA SPT visits).  Hollice Espy, MD  [facility] [address], [place], [place] [address] [phone]

## 2018-01-11 NOTE — Telephone Encounter (Signed)
Patient called the office after his appointment today requesting oxybutynin refill to be sent to his pharmacy.

## 2018-01-11 NOTE — Progress Notes (Signed)
Suprapubic Cath Change  Patient is present today for a suprapubic catheter change due to urinary retention.  60ml of water was drained from the balloon, a 16FR foley cath was removed from the tract with out difficulty.  Site was cleaned and prepped in a sterile fashion with betadine.  A 16FR foley cath was replaced into the tract no complications were noted. Urine return was noted, 10 ml of sterile water was inflated into the balloon and a leg bag was attached for drainage.  Patient tolerated well. A night bag was given to patient and proper instruction was given on how to switch bags.    Preformed by: Fonnie Jarvis, CMA  Follow up: 1 month change

## 2018-01-12 LAB — PSA: PROSTATE SPECIFIC AG, SERUM: 28.5 ng/mL — AB (ref 0.0–4.0)

## 2018-01-12 MED ORDER — OXYBUTYNIN CHLORIDE 5 MG PO TABS: 5.0000 mg | ORAL_TABLET | Freq: Three times a day (TID) | ORAL | 6 refills | Status: DC

## 2018-01-12 NOTE — Telephone Encounter (Signed)
Script refilled

## 2018-01-18 ENCOUNTER — Telehealth: Payer: Self-pay | Admitting: Urology

## 2018-01-18 DIAGNOSIS — C61 Malignant neoplasm of prostate: Secondary | ICD-10-CM

## 2018-01-18 NOTE — Telephone Encounter (Signed)
CT and Bone scan have been approved and will be scheduled  Sarah when does he need a Lupron injection?   Sharyn Lull

## 2018-01-18 NOTE — Telephone Encounter (Signed)
Please make an appointment for him with me to review the results.  We can give him Lupron vs degerelix at this time.  Hollice Espy, MD

## 2018-01-18 NOTE — Telephone Encounter (Signed)
This patient has a history of high risk prostate cancer.  At his visit last week, we checked a PSA which is markedly elevated concerning for progression.  I was previously under the impression that is managed [name] (ADT) for this but it does not appear that she has been following up with them as per my last note.  I will assume care for this now.   We discussed this on the phone today.   I would like him to get a CT abdomen pelvis and a bone scan with results soon as these are completed.  He will need to be resumed on Lupron.  Hollice Espy, MD

## 2018-02-02 ENCOUNTER — Ambulatory Visit
Admission: RE | Admit: 2018-02-02 | Discharge: 2018-02-02 | Disposition: A | Discharge status: Home / Self Care | Payer: Medicare HMO | Source: Ambulatory Visit | Attending: Urology | Admitting: Urology

## 2018-02-02 ENCOUNTER — Encounter
Admission: RE | Admit: 2018-02-02 | Discharge: 2018-02-02 | Disposition: A | Discharge status: Home / Self Care | Payer: Medicare HMO | Source: Ambulatory Visit | Attending: Urology | Admitting: Urology

## 2018-02-02 DIAGNOSIS — Z85038 Personal history of other malignant neoplasm of large intestine: Secondary | ICD-10-CM | POA: Diagnosis not present

## 2018-02-02 DIAGNOSIS — M899 Disorder of bone, unspecified: Secondary | ICD-10-CM | POA: Diagnosis not present

## 2018-02-02 DIAGNOSIS — R590 Localized enlarged lymph nodes: Secondary | ICD-10-CM | POA: Insufficient documentation

## 2018-02-02 DIAGNOSIS — K7460 Unspecified cirrhosis of liver: Secondary | ICD-10-CM | POA: Diagnosis not present

## 2018-02-02 DIAGNOSIS — I700 Atherosclerosis of aorta: Secondary | ICD-10-CM | POA: Diagnosis not present

## 2018-02-02 DIAGNOSIS — C61 Malignant neoplasm of prostate: Secondary | ICD-10-CM

## 2018-02-02 DIAGNOSIS — E279 Disorder of adrenal gland, unspecified: Secondary | ICD-10-CM | POA: Diagnosis not present

## 2018-02-02 DIAGNOSIS — C7951 Secondary malignant neoplasm of bone: Secondary | ICD-10-CM | POA: Diagnosis not present

## 2018-02-02 LAB — POCT I-STAT CREATININE: Creatinine, Ser: 0.7 mg/dL (ref 0.61–1.24)

## 2018-02-02 MED ORDER — IOPAMIDOL (ISOVUE-300) INJECTION 61%
100.0000 mL | Freq: Once | INTRAVENOUS | Status: AC | PRN
  Administered 2018-02-02: 100 mL via INTRAVENOUS

## 2018-02-02 MED ORDER — TECHNETIUM TC 99M MEDRONATE IV KIT
20.0000 | PACK | Freq: Once | INTRAVENOUS | Status: AC | PRN
  Administered 2018-02-02: 25.73 via INTRAVENOUS

## 2018-02-04 ENCOUNTER — Ambulatory Visit: Payer: Self-pay | Admitting: Nurse Practitioner

## 2018-02-10 ENCOUNTER — Ambulatory Visit (INDEPENDENT_AMBULATORY_CARE_PROVIDER_SITE_OTHER): Payer: Medicare HMO | Admitting: Urology

## 2018-02-10 ENCOUNTER — Encounter: Payer: Self-pay | Admitting: Urology

## 2018-02-10 VITALS — BP 152/78 | HR 91 | Ht 70.0 in | Wt 234.6 lb

## 2018-02-10 DIAGNOSIS — C7951 Secondary malignant neoplasm of bone: Secondary | ICD-10-CM | POA: Diagnosis not present

## 2018-02-10 DIAGNOSIS — C61 Malignant neoplasm of prostate: Secondary | ICD-10-CM | POA: Diagnosis not present

## 2018-02-10 DIAGNOSIS — K769 Liver disease, unspecified: Secondary | ICD-10-CM

## 2018-02-10 DIAGNOSIS — N319 Neuromuscular dysfunction of bladder, unspecified: Secondary | ICD-10-CM | POA: Diagnosis not present

## 2018-02-10 MED ORDER — BICALUTAMIDE 50 MG PO TABS: 50.0000 mg | ORAL_TABLET | Freq: Every day | ORAL | 0 refills | Status: DC

## 2018-02-10 NOTE — Progress Notes (Signed)
02/10/2018 2:24 PM   Garin K Martinique 10/18/1954 [id number]  Referring provider: No referring provider defined for this encounter.  Chief Complaint  Patient presents with  . Results  . Urinary Retention  . Prostate Cancer    HPI: 62 year old male with a personal history of high risk prostate cancer with chronic urinary retention  who returns today after staging imaging including CT abdomen pelvis as well as bone scan consistent with widely metastatic prostate cancer to the bones.  He has a personal history of high risk Gleason 5+4 prostate cancer (dx 2017) with biopsy showing evidence of extraprostatic extension, pT3a. He was treated by radiation oncology with EB RT with brachy boost as well as Lupron. TRUS volume 23 cc. Most recent PSA on 07/2016 1.17.He was followed by radiation oncology at [facility] remained on Lupron, last injection 12/2016.  He has not been seen since at [facility] and told that he was "done" with ADT.  PSA was checked by me on 01/11/2018 and found to be markedly elevated to 28.5, up from 1.17 on 08/24/2016.  In light of this dramatic rise in his PSA, he underwent CT abdomen pelvis with contrast on 02/02/2018 which showed widespread patchy sclerotic osseous lesions at the entire skeleton which appeared to have progressed when compared to previous scan from 01/2017.  There is also an incidental 0.9 cm focus of hyperenhancement in the left liver, questionable hepatocellular carcinoma in the setting of cirrhosis.  He denies bone pain or weight loss.  He reports a personal history of development of abdominal wall cellulitis/abscesses from initiation of degarelix and his abdominal wall sites requiring inpatient admission in 2017.  He declined this medication today given his history.   Bladder history: He is been a chronic urinary retention managed with an indwelling suprapubic tube since that time. In 2015, he underwent urodynamics which showed evidence of outlet  obstruction. He was taken to the operating room by Dr. Bernardo Heater for PVP. He voided a few times after that but then developed recurrent retention. He is unable to self cath or tolerate an indwelling penile Foley. He is been managed with a suprapubic tube.  He did undergo cystolitholapaxy on 06/09/2017 with no further issues with encrustation of his tubes.  He continues to desire today to continue clamp trials.  He claps his suprapubic tube on a nearly daily basis for a few hours and then stands to void.  He eventually unclamped this to at least once a day and overnight.  He is satisfied with this arrangement as he feels more like a "man" being able to void spontaneously.  He does note bladder and penile burning with voiding as per previous occasions which is chronic.  He continues both Flomax as well as Myrbetriq 25 mg for his "voiding "symptoms.   PMH: Past Medical History:  Diagnosis Date  . Cancer ([place])    colon  . Colon cancer ([place])    Hx of  . History of kidney stones   . Hypertension   . Prostate cancer ([place])   . Prostate cancer ([place])    Hx of   . S/P colon resection   . Shortness of breath dyspnea   . Suprapubic catheter ([place])   . Urinary retention     Surgical History: Past Surgical History:  Procedure Laterality Date  . ABDOMINAL SURGERY    . COLON SURGERY    . CYSTOSCOPY WITH LITHOLAPAXY N/A 06/09/2017   Procedure: CYSTOSCOPY WITH LITHOLAPAXY;  Surgeon: Hollice Espy, MD;  Location: [facility] ORS;  Service: Urology;  Laterality: N/A;  . PROSTATE SURGERY  05/25/2016  . SUPRAPUBIC CATHETER INSERTION      Home Medications:  Allergies as of 02/10/2018   No Known Allergies     Medication List        Accurate as of 02/10/18 11:59 PM. Always use your most recent med list.          bicalutamide 50 MG tablet Commonly known as:  CASODEX Take 1 tablet (50 mg total) by mouth daily.   carvedilol 3.125 MG tablet Commonly known as:  COREG Take 3.125 mg by mouth 2  (two) times daily.   diphenhydramine-acetaminophen 25-500 MG Tabs tablet Commonly known as:  TYLENOL PM Take 1 tablet by mouth at bedtime.   furosemide 20 MG tablet Commonly known as:  LASIX Take by mouth.   mirabegron ER 25 MG Tb24 tablet Commonly known as:  MYRBETRIQ Take 1 tablet (25 mg total) by mouth daily.   oxybutynin 5 MG tablet Commonly known as:  DITROPAN Take 1 tablet (5 mg total) by mouth 3 (three) times daily.   oxyCODONE-acetaminophen 5-325 MG tablet Commonly known as:  PERCOCET/ROXICET Take 1 tablet by mouth every 8 (eight) hours as needed for severe pain.   tamsulosin 0.4 MG Caps capsule Commonly known as:  FLOMAX TAKE 1 CAPSULE EVERY DAY       Allergies: No Known Allergies  Family History: Family History  Problem Relation Age of Onset  . Hypertension Other   . Alcohol abuse Mother   . Heart attack Father   . Cancer Brother     Social History:  reports that he has been smoking cigarettes. He has been smoking about 0.50 packs per day. He has never used smokeless tobacco. He reports that he drinks alcohol. He reports that he does not use drugs.  ROS: UROLOGY Frequent Urination?: No Hard to postpone urination?: No Burning/pain with urination?: No Get up at night to urinate?: No Leakage of urine?: No Urine stream starts and stops?: No Trouble starting stream?: No Do you have to strain to urinate?: No Blood in urine?: No Urinary tract infection?: No Sexually transmitted disease?: No Injury to kidneys or bladder?: No Painful intercourse?: No Weak stream?: No Erection problems?: No Penile pain?: No  Gastrointestinal Nausea?: No Vomiting?: No Indigestion/heartburn?: No Diarrhea?: No Constipation?: No  Constitutional Fever: No Night sweats?: Yes Weight loss?: No Fatigue?: No  Skin Skin rash/lesions?: No Itching?: No  Eyes Blurred vision?: Yes Double vision?: No  Ears/Nose/Throat Sore throat?: No Sinus problems?:  No  Hematologic/Lymphatic Swollen glands?: No Easy bruising?: No  Cardiovascular Leg swelling?: Yes Chest pain?: No  Respiratory Cough?: No Shortness of breath?: No  Endocrine Excessive thirst?: No  Musculoskeletal Back pain?: Yes Joint pain?: Yes  Neurological Headaches?: Yes Dizziness?: No  Psychologic Depression?: No Anxiety?: No  Physical Exam: BP (!) 152/78 (BP Location: Left Arm, Patient Position: Sitting, Cuff Size: Large)   Pulse 91   Ht 5\' 10"  (1.778 m)   Wt 234 lb 9.6 oz (106.4 kg)   BMI 33.66 kg/m   Constitutional:  Alert and oriented, No acute distress. HEENT: [place] AT, moist mucus membranes.  Trachea midline, no masses. Cardiovascular: No clubbing, cyanosis, or edema. Respiratory: Normal respiratory effort, no increased work of breathing. GI: Abdomen is soft, nontender, nondistended, no abdominal masses, SPT-tube in place and exchanged today. Skin: No rashes, bruises or suspicious lesions. Neurologic: Grossly intact, no focal deficits, moving all 4 extremities. Psychiatric: Normal mood  and affect.  Laboratory Data: Lab Results  Component Value Date   WBC 10.0 10/27/2017   HGB 16.0 10/27/2017   HCT 45.5 10/27/2017   MCV 103.5 (H) 10/27/2017   PLT 143 (L) 10/27/2017    Lab Results  Component Value Date   CREATININE 0.70 02/02/2018   PSA as above  Urinalysis n/a Pertinent Imaging: CLINICAL DATA:  High risk prostate cancer. Elevated PSA. Additional history of colon cancer status post resection.  EXAM: CT ABDOMEN AND PELVIS WITH CONTRAST  TECHNIQUE: Multidetector CT imaging of the abdomen and pelvis was performed using the standard protocol following bolus administration of intravenous contrast.  CONTRAST:  143mL ISOVUE-300 IOPAMIDOL (ISOVUE-300) INJECTION 61%  COMPARISON:  02/13/2017 CT abdomen/pelvis.  FINDINGS: Lower chest: No significant pulmonary nodules or acute consolidative airspace disease. Coronary  atherosclerosis.  Hepatobiliary: There is relative hypertrophy of the lateral segment left liver lobe with diffusely irregular liver surface, compatible with hepatic cirrhosis. There is a 0.9 cm focus of hyperenhancement in left liver lobe (series 2/image 16). No additional liver lesions. Normal gallbladder with no radiopaque cholelithiasis. No biliary ductal dilatation.  Pancreas: Normal, with no mass or duct dilation.  Spleen: Normal size spleen. There is a 1.0 cm anterior splenic capsular lesion (series 2/image 11), stable in size since 02/13/2017, probably benign. No additional splenic lesions.  Adrenals/Urinary Tract: Right adrenal 1.8 cm nodule and 1.0 cm left adrenal nodule are both stable since 09/10/2015 CT, most compatible with benign adenomas. No hydronephrosis. No renal masses. Bladder is relatively decompressed by well-positioned suprapubic Foley catheter. No acute bladder abnormality.  Stomach/Bowel: Normal non-distended stomach. Normal caliber small bowel with no small bowel wall thickening. Normal appendix. Stable postsurgical changes from partial left colectomy. Mild sigmoid diverticulosis. No large bowel wall thickening or acute pericolonic fat stranding.  Vascular/Lymphatic: Atherosclerotic nonaneurysmal abdominal aorta. Patent portal, splenic, hepatic and renal veins. Mildly enlarged 1.2 cm porta hepatis node (series 2/image 27) is stable since 09/10/2015 CT. Enlarged 1.5 cm portacaval node (series 2/image 31) is stable since 09/10/2015 CT. No new pathologically enlarged lymph nodes in the abdomen or pelvis.  Reproductive: Normal size prostate with numerous brachytherapy seeds.  Other: No pneumoperitoneum, ascites or focal fluid collection.  Musculoskeletal: Widespread small patchy sclerotic osseous lesions throughout the entire visualized skeleton, significantly increased in number throughout skeleton since 02/13/2017 CT. Mild  thoracic spondylosis.  IMPRESSION: 1. Widespread small patchy sclerotic osseous lesions throughout the entire visualized skeleton, significantly increased since 02/13/2017 CT, most compatible with progression of osseous metastatic disease. 2. Morphologic changes of hepatic cirrhosis. Indeterminate 0.9 cm focus of hyperenhancement in the left liver lobe, cannot exclude hepatocellular carcinoma. MRI abdomen without and with IV contrast is recommended for further evaluation. 3. Chronic mild porta hepatis/portacaval lymphadenopathy is stable since 09/10/2015 CT, more likely reactive. No new adenopathy. 4. Stable bilateral adrenal nodules since 09/10/2015 CT, most compatible with benign adenomas. 5.  Aortic Atherosclerosis (ICD10-I70.0).   Electronically Signed   By: Ilona Sorrel M.D.   On: 02/02/2018 13:36  CLINICAL DATA:  Prostate cancer, Gleason score 8, staging; history colon cancer, hypertension, smoker  EXAM: NUCLEAR MEDICINE WHOLE BODY BONE SCAN  TECHNIQUE: Whole body anterior and posterior images were obtained approximately 3 hours after intravenous injection of radiopharmaceutical.  RADIOPHARMACEUTICALS:  25.73 mCi Technetium-11m MDP IV  COMPARISON:  None  Radiographic correlation: CT abdomen and pelvis 02/02/2018, 04/28/2015  FINDINGS: Abnormal focally increased osseous tracer accumulation identified at a lateral LEFT ribs approximately 6th and 8th ribs.  Tiny foci of  abnormal increased tracer localization at the calvarium.  Generally increased uptake of tracer throughout the skeleton with diminished soft tissue distribution of tracer and diminished renal tracer.  Findings are suspicious for osseous metastatic disease at the calvarium and LEFT 6th/8th ribs.  Accompanying CT exam however demonstrates numerous tiny foci of sclerosis throughout osseous structures new since 2017 CT consistent with widespread osseous metastases.  Urinary bag with  tracer projects over the RIGHT knee, with RIGHT knee unremarkable on dedicated spot view.  IMPRESSION: Foci of abnormal increased tracer localization at the lateral LEFT sixth and eighth ribs and in the calvarium consistent with osseous metastases.  However, accompanying CT exam demonstrates numerous new tiny foci of sclerosis throughout osseous structures consistent with widespread osseous metastases; these appear confluent by scintigraphy.   Electronically Signed   By: Lavonia Dana M.D.   On: 02/02/2018 14:23  CT scan and bone scan were personally reviewed today with the patient.  Agree with radiologic interpretation as above.  Assessment & Plan:    1. Prostate cancer metastatic to bone [facility]) Personal history of high risk prostate cancer status post IMRT with brachytherapy boost + ADT x 2 years In the interim, unfortunately, there has been some misunderstanding about who is managing his prostate cancer (patient was under the impression that he did not need any further f/u) and he has had a dramatic rise in his PSA, now up to 28 with evidence of diffuse bony metastasis    We discussed resuming ADT today in the form of Dr. Cristie Hem due to concern for possible flare, however, given his history of abdominal wall abscesses with this medication in the past, he declined  He would be willing to pursue Casodex for 2 weeks and then initiate Lupron, script sent to pharmacy and will return in 2 weeks for lupron x 6 months injection.  Given his relatively rapid PSA doubling time and diffuse disease, additional adjuvant treatment is strongly recommended, either docetaxel or additional hormonal pills such as enzalutamide, apalutamide, aberaterone, etc.  I would like him to see medical oncology to discuss chemotherapy although he is hesitant about this due to bad experiences with family members receiving chemotherapy.  Despite this, he is agreed to be seen and evaluated.  They will also discussed  alternatives with him as above  - Ambulatory referral to Oncology  2. Liver lesion Questionable 0.9 cm liver lesion,?  [facility]  We will defer further management to medical oncology  3. Neurogenic bladder Continue SPT, return for acute monthly exchanges  RTC in 2 weeks for 6 month Lupron, f/u 1 month with MD for SPT  Hollice Espy, MD  [place] [address], [place], [place] [address] [phone]  I spent 40 min with this patient of which greater than 50% was spent in counseling and coordination of care with the patient.

## 2018-02-10 NOTE — Progress Notes (Signed)
Suprapubic Cath Change  Patient is present today for a suprapubic catheter change due to urinary retention.  10ml of water was drained from the balloon, a 16FR foley cath was removed from the tract with out difficulty.  Site was cleaned and prepped in a sterile fashion with betadine.  A 16FR foley cath was replaced into the tract no complications were noted. Urine return was noted, 10 ml of sterile water was inflated into the balloon and a leg bag was attached for drainage.  Patient tolerated well. A night bag was given to patient and proper instruction was given on how to switch bags.    Preformed by: Jaloni Davoli, CMA   

## 2018-02-25 ENCOUNTER — Ambulatory Visit: Payer: Medicare HMO

## 2018-02-27 NOTE — Progress Notes (Signed)
[place]  [phone]) [phone] [phone]) [phone]  ID: Gerald Odonnell OB: 10/18/1954  MR#: [id number]  ZJI#:[id number]  Patient Care Team: Lavera Guise, MD as PCP - General (Internal Medicine) Ronnell Freshwater, NP (Family Medicine)  CHIEF COMPLAINT: Prostate cancer metastatic to bone  INTERVAL HISTORY: Patient is a 62 year old man with a known diagnosis of prostate cancer there is referred by evaluation after recent CT scan revealed progressive widespread bony metastasis.  He was initially diagnosed with a Gleason 5+4 prostate cancer in 2017 with extraprostatic extension.  He was treated with XRT as well as brachii therapy and was placed on Lupron.  Patient last received Lupron in September 2018 and then subsequently was lost to follow-up.  His PSA has trended up from approximately 1 to nearly 37.  He currently feels well and is asymptomatic.  Continues to have urinary retention and has a suprapubic tube.  He has no neurologic complaints.  He denies any recent fevers or illnesses.  He has a good appetite and denies weight loss.  He denies any pain.  He has no chest pain or shortness of breath.  He denies any nausea, vomiting, constipation, or diarrhea.  Patient offers no further specific complaints today.  REVIEW OF SYSTEMS:   Review of Systems  Constitutional: Negative.  Negative for fever, malaise/fatigue and weight loss.  Respiratory: Negative.  Negative for cough, hemoptysis and shortness of breath.   Cardiovascular: Negative.  Negative for chest pain and leg swelling.  Gastrointestinal: Negative.  Negative for abdominal pain, blood in stool and melena.  Genitourinary: Negative.  Negative for dysuria and hematuria.  Musculoskeletal: Negative.  Negative for back pain.  Skin: Negative.  Negative for rash.  Neurological: Negative.  Negative for focal weakness, weakness and headaches.  Psychiatric/Behavioral: Negative.  The patient is not nervous/anxious.     As  per HPI. Otherwise, a complete review of systems is negative.  PAST MEDICAL HISTORY: Past Medical History:  Diagnosis Date  . Cancer ([place])    colon  . Colon cancer ([place])    Hx of  . History of kidney stones   . Hypertension   . Prostate cancer ([place])   . Prostate cancer ([place])    Hx of   . S/P colon resection   . Shortness of breath dyspnea   . Suprapubic catheter ([place])   . Urinary retention     PAST SURGICAL HISTORY: Past Surgical History:  Procedure Laterality Date  . ABDOMINAL SURGERY    . COLON SURGERY    . CYSTOSCOPY WITH LITHOLAPAXY N/A 06/09/2017   Procedure: CYSTOSCOPY WITH LITHOLAPAXY;  Surgeon: Hollice Espy, MD;  Location: [facility] ORS;  Service: Urology;  Laterality: N/A;  . PROSTATE SURGERY  05/25/2016  . SUPRAPUBIC CATHETER INSERTION      FAMILY HISTORY: Family History  Problem Relation Age of Onset  . Hypertension Other   . Alcohol abuse Mother   . Heart attack Father   . Prostate cancer Brother   . Breast cancer Sister     ADVANCED DIRECTIVES (Y/N):  N  HEALTH MAINTENANCE: Social History   Tobacco Use  . Smoking status: Current Every Day Smoker    Packs/day: 0.25    Years: 40.00    Pack years: 10.00    Types: Cigarettes  . Smokeless tobacco: Never Used  . Tobacco comment: 1pack a week  Substance Use Topics  . Alcohol use: Yes    Comment: occassional  . Drug use: No     Colonoscopy:  PAP:  Bone density:  Lipid panel:  No Known Allergies  Current Outpatient Medications  Medication Sig Dispense Refill  . diphenhydramine-acetaminophen (TYLENOL PM) 25-500 MG TABS tablet Take 1 tablet by mouth at bedtime.     Marland Kitchen abiraterone acetate (ZYTIGA) 250 MG tablet Take 4 tablets (1,000 mg total) by mouth daily. Take on an empty stomach 1 hour before or 2 hours after a meal. 120 tablet 3  . carvedilol (COREG) 3.125 MG tablet Take 3.125 mg by mouth 2 (two) times daily.     . furosemide (LASIX) 20 MG tablet Take by mouth.    . mirabegron ER (MYRBETRIQ)  25 MG TB24 tablet Take 1 tablet (25 mg total) by mouth daily. (Patient not taking: Reported on 03/03/2018) 30 tablet 3  . oxybutynin (DITROPAN) 5 MG tablet Take 1 tablet (5 mg total) by mouth 3 (three) times daily. (Patient not taking: Reported on 03/03/2018) 90 tablet 6  . oxyCODONE-acetaminophen (PERCOCET) 5-325 MG tablet Take 1 tablet by mouth every 8 (eight) hours as needed for severe pain. (Patient not taking: Reported on 03/03/2018) 12 tablet 0  . predniSONE (DELTASONE) 5 MG tablet Take 1 tablet (5 mg total) by mouth daily with breakfast. 30 tablet 3  . tamsulosin (FLOMAX) 0.4 MG CAPS capsule TAKE 1 CAPSULE EVERY DAY (Patient not taking: Reported on 03/03/2018) 90 capsule 1   No current facility-administered medications for this visit.     OBJECTIVE: Vitals:   03/03/18 1339  BP: (!) 141/80  Pulse: 93  Resp: 18  Temp: 97.6 F (36.4 C)     Body mass index is 33.19 kg/m.    ECOG FS:0 - Asymptomatic  General: Well-developed, well-nourished, no acute distress. Eyes: Pink conjunctiva, anicteric sclera. HEENT: Normocephalic, moist mucous membranes, clear oropharnyx. Lungs: Clear to auscultation bilaterally. Heart: Regular rate and rhythm. No rubs, murmurs, or gallops. Abdomen: Soft, nontender, nondistended. No organomegaly noted, normoactive bowel sounds. Musculoskeletal: No edema, cyanosis, or clubbing.  Suprapubic tube in place. Neuro: Alert, answering all questions appropriately. Cranial nerves grossly intact. Skin: No rashes or petechiae noted. Psych: Normal affect. Lymphatics: No cervical, calvicular, axillary or inguinal LAD.   LAB RESULTS:  Lab Results  Component Value Date   NA 138 03/03/2018   K 4.3 03/03/2018   CL 108 03/03/2018   CO2 26 03/03/2018   GLUCOSE 86 03/03/2018   BUN 11 03/03/2018   CREATININE 0.68 03/03/2018   CALCIUM 9.3 03/03/2018   PROT 7.6 03/03/2018   ALBUMIN 3.9 03/03/2018   AST 66 (H) 03/03/2018   ALT 44 03/03/2018   ALKPHOS 432 (H) 03/03/2018     BILITOT 0.9 03/03/2018   GFRNONAA >60 03/03/2018   GFRAA >60 03/03/2018    Lab Results  Component Value Date   WBC 7.2 03/03/2018   NEUTROABS 4.7 03/03/2018   HGB 16.0 03/03/2018   HCT 45.3 03/03/2018   MCV 101.8 (H) 03/03/2018   PLT 112 (L) 03/03/2018     STUDIES: Nm Bone Scan Whole Body  Result Date: 02/02/2018 CLINICAL DATA:  Prostate cancer, Gleason score 8, staging; history colon cancer, hypertension, smoker EXAM: NUCLEAR MEDICINE WHOLE BODY BONE SCAN TECHNIQUE: Whole body anterior and posterior images were obtained approximately 3 hours after intravenous injection of radiopharmaceutical. RADIOPHARMACEUTICALS:  25.73 mCi Technetium-92m MDP IV COMPARISON:  None Radiographic correlation: CT abdomen and pelvis 02/02/2018, 04/28/2015 FINDINGS: Abnormal focally increased osseous tracer accumulation identified at a lateral LEFT ribs approximately 6th and 8th ribs. Tiny foci of abnormal increased tracer localization at the  calvarium. Generally increased uptake of tracer throughout the skeleton with diminished soft tissue distribution of tracer and diminished renal tracer. Findings are suspicious for osseous metastatic disease at the calvarium and LEFT 6th/8th ribs. Accompanying CT exam however demonstrates numerous tiny foci of sclerosis throughout osseous structures new since 2017 CT consistent with widespread osseous metastases. Urinary bag with tracer projects over the RIGHT knee, with RIGHT knee unremarkable on dedicated spot view. IMPRESSION: Foci of abnormal increased tracer localization at the lateral LEFT sixth and eighth ribs and in the calvarium consistent with osseous metastases. However, accompanying CT exam demonstrates numerous new tiny foci of sclerosis throughout osseous structures consistent with widespread osseous metastases; these appear confluent by scintigraphy. Electronically Signed   By: Lavonia Dana M.D.   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  Patient's PSA continues to increase and is now 36.95.  In 2017 upon diagnosis, he reportedly received XRT along with brachii therapy.  He last received Lupron in September 2018.  Patient states he had allergic reaction to Lupron and refuses to take any further injections.  He also was given a prescription for Casodex by urology, but has refused to take this as well.  He states he will never take any chemotherapy, but has reluctantly agreed to initiate Zytiga plus prednisone.  Typically in castrate sensitive disease should be treated with both Zytiga and Lupron, but patient is refusing injections as above.  Return to clinic in 1 month for repeat laboratory work and to assess his toleration of treatment.  I spent a total of 60 minutes face-to-face with the patient of which greater than 50% of the visit was spent in counseling and coordination of care as detailed above.   Patient expressed understanding and was in agreement with this plan. He also understands that He can call clinic at any time with any questions, concerns, or complaints.   Cancer Staging Prostate cancer [facility]) Staging form: Prostate, AJCC 8th Edition - Clinical stage from 03/04/2018: Stage IVB (cT3a, cN0, cM1b, Grade Group: 5) - Signed by Lloyd Huger, MD on 03/04/2018   Lloyd Huger, MD   03/04/2018 12:27 PM

## 2018-02-28 ENCOUNTER — Inpatient Hospital Stay: Payer: Medicare HMO | Admitting: Oncology

## 2018-03-03 ENCOUNTER — Encounter: Payer: Self-pay | Admitting: Oncology

## 2018-03-03 ENCOUNTER — Inpatient Hospital Stay: Payer: Medicare HMO | Attending: Oncology | Admitting: Oncology

## 2018-03-03 ENCOUNTER — Inpatient Hospital Stay: Payer: Medicare HMO

## 2018-03-03 ENCOUNTER — Other Ambulatory Visit: Payer: Self-pay

## 2018-03-03 ENCOUNTER — Telehealth: Payer: Self-pay | Admitting: Pharmacist

## 2018-03-03 VITALS — BP 141/80 | HR 93 | Temp 97.6°F | Resp 18 | Ht 70.0 in | Wt 231.3 lb

## 2018-03-03 DIAGNOSIS — I10 Essential (primary) hypertension: Secondary | ICD-10-CM | POA: Insufficient documentation

## 2018-03-03 DIAGNOSIS — Z7951 Long term (current) use of inhaled steroids: Secondary | ICD-10-CM | POA: Diagnosis not present

## 2018-03-03 DIAGNOSIS — Z8042 Family history of malignant neoplasm of prostate: Secondary | ICD-10-CM | POA: Insufficient documentation

## 2018-03-03 DIAGNOSIS — F17210 Nicotine dependence, cigarettes, uncomplicated: Secondary | ICD-10-CM | POA: Insufficient documentation

## 2018-03-03 DIAGNOSIS — Z79899 Other long term (current) drug therapy: Secondary | ICD-10-CM | POA: Diagnosis not present

## 2018-03-03 DIAGNOSIS — C61 Malignant neoplasm of prostate: Secondary | ICD-10-CM | POA: Insufficient documentation

## 2018-03-03 DIAGNOSIS — Z85038 Personal history of other malignant neoplasm of large intestine: Secondary | ICD-10-CM | POA: Diagnosis not present

## 2018-03-03 DIAGNOSIS — C7951 Secondary malignant neoplasm of bone: Secondary | ICD-10-CM | POA: Insufficient documentation

## 2018-03-03 DIAGNOSIS — Z803 Family history of malignant neoplasm of breast: Secondary | ICD-10-CM | POA: Insufficient documentation

## 2018-03-03 LAB — COMPREHENSIVE METABOLIC PANEL
ALK PHOS: 432 U/L — AB (ref 38–126)
ALT: 44 U/L (ref 0–44)
ANION GAP: 4 — AB (ref 5–15)
AST: 66 U/L — ABNORMAL HIGH (ref 15–41)
Albumin: 3.9 g/dL (ref 3.5–5.0)
BILIRUBIN TOTAL: 0.9 mg/dL (ref 0.3–1.2)
BUN: 11 mg/dL (ref 8–23)
CO2: 26 mmol/L (ref 22–32)
Calcium: 9.3 mg/dL (ref 8.9–10.3)
Chloride: 108 mmol/L (ref 98–111)
Creatinine, Ser: 0.68 mg/dL (ref 0.61–1.24)
GFR calc non Af Amer: >60 mL/min (ref >60)
Glucose, Bld: 86 mg/dL (ref 70–99)
POTASSIUM: 4.3 mmol/L (ref 3.5–5.1)
SODIUM: 138 mmol/L (ref 135–145)
TOTAL PROTEIN: 7.6 g/dL (ref 6.5–8.1)

## 2018-03-03 LAB — CBC WITH DIFFERENTIAL/PLATELET
ABS IMMATURE GRANULOCYTES: 0.03 10*3/uL (ref 0.00–0.07)
BASOS ABS: 0.1 10*3/uL (ref 0.0–0.1)
BASOS PCT: 1 %
EOS ABS: 0.1 10*3/uL (ref 0.0–0.5)
Eosinophils Relative: 2 %
HCT: 45.3 % (ref 39.0–52.0)
Hemoglobin: 16 g/dL (ref 13.0–17.0)
IMMATURE GRANULOCYTES: 0 %
Lymphocytes Relative: 22 %
Lymphs Abs: 1.6 10*3/uL (ref 0.7–4.0)
MCH: 36 pg — ABNORMAL HIGH (ref 26.0–34.0)
MCHC: 35.3 g/dL (ref 30.0–36.0)
MCV: 101.8 fL — AB (ref 80.0–100.0)
Monocytes Absolute: 0.7 10*3/uL (ref 0.1–1.0)
Monocytes Relative: 10 %
NEUTROS ABS: 4.7 10*3/uL (ref 1.7–7.7)
NEUTROS PCT: 65 %
NRBC: 0 % (ref 0.0–0.2)
PLATELETS: 112 10*3/uL — AB (ref 150–400)
RBC: 4.45 MIL/uL (ref 4.22–5.81)
RDW: 12.5 % (ref 11.5–15.5)
WBC: 7.2 10*3/uL (ref 4.0–10.5)

## 2018-03-03 LAB — PSA: Prostatic Specific Antigen: 36.95 ng/mL — ABNORMAL HIGH (ref 0.00–4.00)

## 2018-03-03 MED ORDER — PREDNISONE 5 MG PO TABS: 5.0000 mg | ORAL_TABLET | Freq: Every day | ORAL | 3 refills | Status: DC

## 2018-03-03 MED ORDER — ABIRATERONE ACETATE 250 MG PO TABS: 1000.0000 mg | ORAL_TABLET | Freq: Every day | ORAL | 3 refills | Status: DC

## 2018-03-03 NOTE — Progress Notes (Signed)
Patient here for initial visit. °

## 2018-03-03 NOTE — Telephone Encounter (Signed)
Oral Chemotherapy Pharmacist Encounter  Patient Education I spoke with patient following his OV for overview of new oral chemotherapy medication: Zytiga (abiraterone) for the treatment of castrate sensitive prostate cancer metastatic to bone in conjunction with prednisone (patient refusing lupron injections), planned duration until disease progression or if patient experiences intolerable side effects/toxcities.   Pt is doing well. Counseled patient on administration, dosing, side effects, monitoring, drug-food interactions, safe handling, storage, and disposal. Patient will take 4 tablets (1,000 mg total) by mouth daily. Take on an empty stomach 1 hour before or 2 hours after a meal.  He will also take prednisone 1 tablet (5 mg total) by mouth daily with breakfast.  Side effects include but not limited to: fatigue, increased BP, decreased WBC.    Reviewed with patient importance of keeping a medication schedule and plan for any missed doses.  Gerald Odonnell voiced understanding and appreciation. All questions answered. Medication handout provided and consent obtained.  Provided patient with [facility] phone number. Patient knows to call the office with questions or concerns. Oral Chemotherapy Navigation Clinic will continue to follow.  Darl Pikes, PharmD, BCPS, [facility] Hematology/Oncology Clinical Pharmacist [facility] [place] [facility] [phone]  03/03/2018 4:05 PM

## 2018-03-03 NOTE — Telephone Encounter (Signed)
Oral Oncology Pharmacist Encounter  Received new prescription for Zytiga (abiraterone) for the treatment of prostate cancer metastatic to bone in conjunction with prednisone (patient refusing lupron injections), planned duration until disease progression or if patient experiences intolerable side effects/toxcities.  CBC and CMP from 03/03/2018 assessed, AST/Alk Phos elevated. On 02/02/2018 CT abdomen it was noted patient had "morphologic changes of hepatic cirrhosis". Continue to monitor hepatic function. Prescription dose and frequency assessed.   Current medication list in Epic reviewed, the following DDIs with Zytiga were identified:  Carvedilol and Zytiga: Zytiga may decrease the elimination of carvedilol thus increasing plasma concentrations. Patient should be monitored for bradycardia and hypotension during treatment.   Prescription has been e-scribed to the [facility] for benefits analysis and approval.  Oral Oncology Clinic will continue to follow for insurance authorization, copayment issues, initial counseling and start date.  Leron Croak, PharmD PGY1 Pharmacy Resident Phone: [phone] 03/03/2018 3:39 PM

## 2018-03-04 ENCOUNTER — Telehealth: Payer: Self-pay | Admitting: Pharmacy Technician

## 2018-03-04 NOTE — Telephone Encounter (Signed)
Oral Oncology Patient Advocate Encounter  Received notification from [facility] that prior authorization for Gerald Odonnell is required.  PA submitted on CoverMyMeds Key [id number] Status is pending  Oral Oncology Clinic will continue to follow.  [place] Patient [place] Phone [phone] Fax [phone] 03/04/2018 9:42 AM

## 2018-03-07 NOTE — Telephone Encounter (Signed)
Oral Oncology Patient Advocate Encounter  Prior Authorization for Fabio Asa has been approved.    PA# [id number] Effective dates: 03/05/18 through 09/03/18  Patients co-pay is $3.40  Oral Oncology Clinic will continue to follow.   [place] Patient [place] Phone [phone] Fax [phone] 03/07/2018 8:24 AM

## 2018-03-08 MED FILL — ABIRATERONE ACETATE 250 MG: 250 | 30 days supply | Qty: 120 | Fill #0

## 2018-03-08 MED FILL — predniSONE 5 MG TABS: 5 | 30 days supply | Qty: 30 | Fill #0

## 2018-03-08 NOTE — Telephone Encounter (Signed)
Scheduled delivery of medication for tomorrow 03/09/18.  Patient states that he will start on Friday (easier for him to remember).

## 2018-03-16 NOTE — Progress Notes (Signed)
03/17/2018 9:29 AM   Gerald Odonnell 10/18/1954 [id number]  Referring provider: Lavera Guise, [place], [place] [address]  Chief Complaint  Patient presents with  . Prostate Cancer    1 month    HPI: Gerald Odonnell is a 62 yo M who returns today for management and evaluation of prostate cancer with metastasis to bone, liver lesion, and neurogenic bladder. The patient's last visit with [name] was on 02/10/2018.    Since last visit, he was referred to oncology and Dr. Grayland Ormond advised to start Zytiga and prednisone.  He has refused lupron/ casodex.  Pt reports of a PSA test scheduled for 04/04/18 and reports doing pretty well overall. He does not complain of urinary symptoms and the pt is interested in home health for SPT exchanges if possible.   Previous history:  He has a personal history of high risk Gleason 5+4 prostate cancer (dx 2017) with biopsy showing evidence of extraprostatic extension, pT3a. He was treated by radiation oncology with EB RT with brachy boost as well as Lupron. TRUSvolume 23 cc.He was followedby radiation oncology at [facility] remained on Lupron, last injection 12/2016.He has not been seen since at [facility] and told that he was "done" with ADT.  In light of this dramatic rise in his PSA, he underwent a CT abdomen pelvis with contrast on 02/02/2018 which showed widespread patchy sclerotic osseous lesions at the entire skeleton which appeared to have progressed when compared to previous scan from 01/2017.  There is also an incidental 0.9 cm focus of hyperenhancement in the left liver, questionable hepatocellular carcinoma in the setting of cirrhosis.  Most recent PSA is 36.95 from 03/03/2018. PSA trend below.    PSA Trend 03/03/2018, 36.95 01/01/2018, 28.5 08/24/2016, 1.17   Bladder history: He is been a chronic urinary retention managed with an indwelling suprapubic tube since that time. In 2015, he underwent urodynamics which showed  evidence of outlet obstruction. He was taken to the operating room by Dr. Bernardo Heater for PVP. He voided a few times after that but then developed recurrent retention. He is unable to self cath or tolerate an indwelling penile Foley. He is been managed with a suprapubic tube. He did undergo cystolitholapaxy on 06/09/2017 with no further issues with encrustation of his tubes.  He continues to desire today to continue clamp trials. He claps his suprapubic tube on a nearly daily basis for a few hours and then stands to void. He eventually unclamped this to at least once a day and overnight. He is satisfied with this arrangement as he feels more like a "man" being able to void spontaneously. He does note bladder and penile burning with voiding as per previous occasions which is chronic.  He is no longer taking flomax or mybetriq.    PMH: Past Medical History:  Diagnosis Date  . Cancer ([place])    colon  . Colon cancer ([place])    Hx of  . History of kidney stones   . Hypertension   . Prostate cancer ([place])   . Prostate cancer ([place])    Hx of   . S/P colon resection   . Shortness of breath dyspnea   . Suprapubic catheter ([place])   . Urinary retention     Surgical History: Past Surgical History:  Procedure Laterality Date  . ABDOMINAL SURGERY    . COLON SURGERY    . CYSTOSCOPY WITH LITHOLAPAXY N/A 06/09/2017   Procedure: CYSTOSCOPY WITH LITHOLAPAXY;  Surgeon: Hollice Espy,  MD;  Location: [facility] ORS;  Service: Urology;  Laterality: N/A;  . PROSTATE SURGERY  05/25/2016  . SUPRAPUBIC CATHETER INSERTION      Home Medications:  Allergies as of 03/17/2018   No Known Allergies     Medication List        Accurate as of 03/17/18  9:29 AM. Always use your most recent med list.          abiraterone acetate 250 MG tablet Commonly known as:  ZYTIGA Take 4 tablets (1,000 mg total) by mouth daily. Take on an empty stomach 1 hour before or 2 hours after a meal.   carvedilol 3.125 MG  tablet Commonly known as:  COREG Take 3.125 mg by mouth 2 (two) times daily.   furosemide 20 MG tablet Commonly known as:  LASIX Take by mouth.   predniSONE 5 MG tablet Commonly known as:  DELTASONE Take 1 tablet (5 mg total) by mouth daily with breakfast.       Allergies: No Known Allergies  Family History: Family History  Problem Relation Age of Onset  . Hypertension Other   . Alcohol abuse Mother   . Heart attack Father   . Prostate cancer Brother   . Breast cancer Sister     Social History:  reports that he has been smoking cigarettes. He has a 10.00 pack-year smoking history. He has never used smokeless tobacco. He reports that he drinks alcohol. He reports that he does not use drugs.  ROS: UROLOGY Frequent Urination?: No Hard to postpone urination?: No Burning/pain with urination?: No Get up at night to urinate?: No Leakage of urine?: No Urine stream starts and stops?: No Trouble starting stream?: No Do you have to strain to urinate?: No Blood in urine?: No Urinary tract infection?: No Sexually transmitted disease?: No Injury to kidneys or bladder?: No Painful intercourse?: No Weak stream?: No Erection problems?: No Penile pain?: No  Gastrointestinal Nausea?: No Vomiting?: No Indigestion/heartburn?: No Diarrhea?: No Constipation?: No  Constitutional Fever: No Night sweats?: No Weight loss?: No Fatigue?: No  Skin Skin rash/lesions?: No Itching?: No  Eyes Blurred vision?: Yes Double vision?: No  Ears/Nose/Throat Sore throat?: No Sinus problems?: No  Hematologic/Lymphatic Swollen glands?: No Easy bruising?: No  Cardiovascular Leg swelling?: Yes Chest pain?: No  Respiratory Cough?: No Shortness of breath?: No  Endocrine Excessive thirst?: No  Musculoskeletal Back pain?: Yes Joint pain?: Yes  Neurological Headaches?: No Dizziness?: No  Psychologic Depression?: No Anxiety?: No  Physical Exam: BP (!) 153/94   Pulse  93   Ht 5\' 10"  (1.778 m)   Wt 230 lb (104.3 kg)   BMI 33.00 kg/m   Constitutional:  Alert and oriented, No acute distress. HEENT: [place] AT, moist mucus membranes.  Trachea midline, no masses. Cardiovascular: No clubbing, cyanosis, or edema. Respiratory: Normal respiratory effort, no increased work of breathing. Skin: No rashes, bruises or suspicious lesions. Neurologic: Grossly intact, no focal deficits, moving all 4 extremities. Psychiatric: Normal mood and affect.  Laboratory Data: PSA trend as above  SPT exchanged today by [facility].  Assessment & Plan:    1. Prostate cancer metastatic to bone  Personal history of high risk prostate cancer status post IMRT with brachytherapy boost + ADT x 2 years With progression to bony metastatic diease, stage IVb prostate cancer (Gleason's 5+4) metastatic to bone  Referred to oncology for Zytiga and prednisone treatment , will defer further management to Dr. Grayland Ormond  Follow along with serial PSA  2. Liver lesion  Questionable 0.9 cm liver lesion? No address by oncology, will message Dr. Grayland Ormond for recs re: follow up of this  3. Neurogenic bladder  Continue SPT, return for acute monthly exchanges Consider home health if possible for tube changes  Return for 6 months with MD, qmonthly SPT.  Hollice Espy, MD  [facility] [address] [place], [place] [address] [phone]  I, Lucas Mallow, am acting as a scribe for Dr. Hollice Espy,  I have reviewed the above documentation for accuracy and completeness, and I agree with the above.   Hollice Espy, MD

## 2018-03-17 ENCOUNTER — Ambulatory Visit (INDEPENDENT_AMBULATORY_CARE_PROVIDER_SITE_OTHER): Payer: Medicare HMO | Admitting: Urology

## 2018-03-17 ENCOUNTER — Encounter: Payer: Self-pay | Admitting: Urology

## 2018-03-17 VITALS — BP 153/94 | HR 93 | Ht 70.0 in | Wt 230.0 lb

## 2018-03-17 DIAGNOSIS — C7951 Secondary malignant neoplasm of bone: Secondary | ICD-10-CM

## 2018-03-17 DIAGNOSIS — K769 Liver disease, unspecified: Secondary | ICD-10-CM

## 2018-03-17 DIAGNOSIS — C61 Malignant neoplasm of prostate: Secondary | ICD-10-CM | POA: Diagnosis not present

## 2018-03-17 DIAGNOSIS — N319 Neuromuscular dysfunction of bladder, unspecified: Secondary | ICD-10-CM | POA: Diagnosis not present

## 2018-03-31 ENCOUNTER — Other Ambulatory Visit: Payer: Self-pay

## 2018-03-31 DIAGNOSIS — N319 Neuromuscular dysfunction of bladder, unspecified: Secondary | ICD-10-CM

## 2018-03-31 DIAGNOSIS — R339 Retention of urine, unspecified: Secondary | ICD-10-CM

## 2018-04-02 ENCOUNTER — Other Ambulatory Visit: Payer: Self-pay

## 2018-04-02 ENCOUNTER — Emergency Department
Admission: EM | Admit: 2018-04-02 | Discharge: 2018-04-02 | Disposition: A | Discharge status: Home / Self Care | Payer: Medicare HMO | Attending: Emergency Medicine | Admitting: Emergency Medicine

## 2018-04-02 DIAGNOSIS — I10 Essential (primary) hypertension: Secondary | ICD-10-CM | POA: Insufficient documentation

## 2018-04-02 DIAGNOSIS — T83091A Other mechanical complication of indwelling urethral catheter, initial encounter: Secondary | ICD-10-CM | POA: Diagnosis not present

## 2018-04-02 DIAGNOSIS — R339 Retention of urine, unspecified: Secondary | ICD-10-CM

## 2018-04-02 DIAGNOSIS — N390 Urinary tract infection, site not specified: Secondary | ICD-10-CM | POA: Diagnosis not present

## 2018-04-02 DIAGNOSIS — F17210 Nicotine dependence, cigarettes, uncomplicated: Secondary | ICD-10-CM | POA: Diagnosis not present

## 2018-04-02 DIAGNOSIS — Y731 Therapeutic (nonsurgical) and rehabilitative gastroenterology and urology devices associated with adverse incidents: Secondary | ICD-10-CM | POA: Insufficient documentation

## 2018-04-02 DIAGNOSIS — T839XXA Unspecified complication of genitourinary prosthetic device, implant and graft, initial encounter: Secondary | ICD-10-CM

## 2018-04-02 DIAGNOSIS — Z79899 Other long term (current) drug therapy: Secondary | ICD-10-CM | POA: Diagnosis not present

## 2018-04-02 LAB — CBC WITH DIFFERENTIAL/PLATELET
ABS IMMATURE GRANULOCYTES: 0.01 10*3/uL (ref 0.00–0.07)
Basophils Absolute: 0.1 10*3/uL (ref 0.0–0.1)
Basophils Relative: 1 %
EOS PCT: 2 %
Eosinophils Absolute: 0.1 10*3/uL (ref 0.0–0.5)
HEMATOCRIT: 43.7 % (ref 39.0–52.0)
HEMOGLOBIN: 15.2 g/dL (ref 13.0–17.0)
Immature Granulocytes: 0 %
LYMPHS ABS: 1.8 10*3/uL (ref 0.7–4.0)
Lymphocytes Relative: 29 %
MCH: 36 pg — AB (ref 26.0–34.0)
MCHC: 34.8 g/dL (ref 30.0–36.0)
MCV: 103.6 fL — ABNORMAL HIGH (ref 80.0–100.0)
MONO ABS: 0.6 10*3/uL (ref 0.1–1.0)
MONOS PCT: 9 %
NEUTROS PCT: 59 %
NRBC: 0 % (ref 0.0–0.2)
Neutro Abs: 3.8 10*3/uL (ref 1.7–7.7)
Platelets: 120 10*3/uL — ABNORMAL LOW (ref 150–400)
RBC: 4.22 MIL/uL (ref 4.22–5.81)
RDW: 11.9 % (ref 11.5–15.5)
WBC: 6.3 10*3/uL (ref 4.0–10.5)

## 2018-04-02 LAB — BASIC METABOLIC PANEL
Anion gap: 11 (ref 5–15)
BUN: 10 mg/dL (ref 8–23)
CHLORIDE: 106 mmol/L (ref 98–111)
CO2: 22 mmol/L (ref 22–32)
CREATININE: 0.68 mg/dL (ref 0.61–1.24)
Calcium: 8.8 mg/dL — ABNORMAL LOW (ref 8.9–10.3)
GFR calc Af Amer: >60 mL/min (ref >60)
GFR calc non Af Amer: >60 mL/min (ref >60)
Glucose, Bld: 96 mg/dL (ref 70–99)
POTASSIUM: 3.5 mmol/L (ref 3.5–5.1)
SODIUM: 139 mmol/L (ref 135–145)

## 2018-04-02 LAB — URINALYSIS, COMPLETE (UACMP) WITH MICROSCOPIC
Bilirubin Urine: NEGATIVE
GLUCOSE, UA: NEGATIVE mg/dL
Ketones, ur: NEGATIVE mg/dL
NITRITE: POSITIVE — AB
PROTEIN: NEGATIVE mg/dL
Specific Gravity, Urine: 1.011 (ref 1.005–1.030)
pH: 5 (ref 5.0–8.0)

## 2018-04-02 MED ORDER — CIPROFLOXACIN HCL 500 MG PO TABS: 500.0000 mg | ORAL_TABLET | Freq: Two times a day (BID) | ORAL | 0 refills | Status: DC

## 2018-04-02 MED ORDER — CIPROFLOXACIN HCL 500 MG PO TABS
500.0000 mg | ORAL_TABLET | Freq: Once | ORAL | Status: AC
  Administered 2018-04-02: 500 mg via ORAL
  Filled 2018-04-02: qty 1

## 2018-04-02 NOTE — ED Notes (Signed)
Pt switched to leg bag to go home with.

## 2018-04-02 NOTE — ED Provider Notes (Signed)
[facility] Emergency Department Provider Note  ___________________________________________   First MD Initiated Contact with Patient 04/02/18 1233     (approximate)  I have reviewed the triage vital signs and the nursing notes.   HISTORY  Chief Complaint Urinary Retention   HPI Gerald Odonnell is a 62 y.o. male with a history of colon cancer, kidney stones, prostate cancer as well as a suprapubic catheter that he says he has had for 6 years who is presenting to the emergency department with 1 day of no urine drainage from his catheter and abdominal pain fullness.  He denies any fever.  Says that he just had a catheter change approximately 2 weeks ago.   Past Medical History:  Diagnosis Date  . Cancer ([place])    colon  . Colon cancer ([place])    Hx of  . History of kidney stones   . Hypertension   . Prostate cancer ([place])   . Prostate cancer ([place])    Hx of   . S/P colon resection   . Shortness of breath dyspnea   . Suprapubic catheter ([place])   . Urinary retention     Patient Active Problem List   Diagnosis Date Noted  . Benign hypertension 08/22/2017  . Goals of care, counseling/discussion 08/22/2017  . Dysuria 08/22/2017  . Urinary retention 03/15/2017  . Prostate cancer ([place]) 03/15/2017  . Abscess 01/24/2016  . Sepsis ([place]) 09/10/2015  . Chest pain 09/10/2015  . 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 Clostridium difficile diarrhea   . Colitis, infectious 04/23/2015  . Incomplete bladder emptying 09/10/2014  . Edema leg 09/15/2013  . Pelvic and perineal pain 09/15/2013  . Lower extremity venous stasis 09/15/2013  . Pelvic pain in male 09/15/2013  . Benign prostatic hyperplasia with urinary obstruction 07/22/2013    Past Surgical History:  Procedure Laterality Date  . ABDOMINAL SURGERY    . COLON SURGERY    . CYSTOSCOPY WITH LITHOLAPAXY N/A 06/09/2017   Procedure: CYSTOSCOPY WITH LITHOLAPAXY;  Surgeon: Hollice Espy, MD;  Location: [facility] ORS;   Service: Urology;  Laterality: N/A;  . PROSTATE SURGERY  05/25/2016  . SUPRAPUBIC CATHETER INSERTION      Prior to Admission medications   Medication Sig Start Date End Date Taking? Authorizing Provider  abiraterone acetate (ZYTIGA) 250 MG tablet Take 4 tablets (1,000 mg total) by mouth daily. Take on an empty stomach 1 hour before or 2 hours after a meal. 03/03/18   Finnegan, Kathlene November, MD  carvedilol (COREG) 3.125 MG tablet Take 3.125 mg by mouth 2 (two) times daily.     [provider]  furosemide (LASIX) 20 MG tablet Take by mouth.    [provider]  predniSONE (DELTASONE) 5 MG tablet Take 1 tablet (5 mg total) by mouth daily with breakfast. 03/03/18   Lloyd Huger, MD    Allergies Patient has no known allergies.  Family History  Problem Relation Age of Onset  . Hypertension Other   . Alcohol abuse Mother   . Heart attack Father   . Prostate cancer Brother   . Breast cancer Sister     Social History Social History   Tobacco Use  . Smoking status: Current Every Day Smoker    Packs/day: 0.25    Years: 40.00    Pack years: 10.00    Types: Cigarettes  . Smokeless tobacco: Never Used  . Tobacco comment: 1pack a week  Substance Use Topics  . Alcohol use: Yes  Comment: occassional  . Drug use: No    Review of Systems  Constitutional: No fever/chills Eyes: No visual changes. ENT: No sore throat. Cardiovascular: Denies chest pain. Respiratory: Denies shortness of breath. Gastrointestinal:  No nausea, no vomiting.  No diarrhea.  No constipation. Genitourinary: Negative for dysuria. Musculoskeletal: Negative for back pain. Skin: Negative for rash. Neurological: Negative for headaches, focal weakness or numbness.   ____________________________________________   PHYSICAL EXAM:  VITAL SIGNS: ED Triage Vitals  Enc Vitals Group     BP 04/02/18 1211 (!) 173/112     Pulse Rate 04/02/18 1211 (!) 123     Resp 04/02/18 1211 (!) 22     Temp  04/02/18 1211 97.7 F (36.5 C)     Temp Source 04/02/18 1211 Oral     SpO2 04/02/18 1211 96 %     Weight 04/02/18 1210 230 lb (104.3 kg)     Height 04/02/18 1210 5\' 10"  (1.778 m)     Head Circumference --      Peak Flow --      Pain Score 04/02/18 1210 10     Pain Loc --      Pain Edu? --      Excl. in [place]? --     Constitutional: Alert and oriented.  Patient appears uncomfortable.  Diaphoretic. Eyes: Conjunctivae are normal.  Head: Atraumatic. Nose: No congestion/rhinnorhea. Mouth/Throat: Mucous membranes are moist.  Neck: No stridor.   Cardiovascular: Normal rate, regular rhythm. Grossly normal heart sounds.  Respiratory: Normal respiratory effort.  No retractions. Lungs CTAB. Gastrointestinal:   Tender to palpation to the area of the suprapubic catheter with fullness to the suprapubic region as well.  There is no urine in the patient's leg bag.  Musculoskeletal: No lower extremity tenderness nor edema.  No joint effusions. Neurologic:  Normal speech and language. No gross focal neurologic deficits are appreciated. Skin:  Skin is warm, dry and intact. No rash noted. Psychiatric: Mood and affect are normal. Speech and behavior are normal.  ____________________________________________   LABS (all labs ordered are listed, but only abnormal results are displayed)  Labs Reviewed  CBC WITH DIFFERENTIAL/PLATELET - Abnormal; Notable for the following components:      Result Value   MCV 103.6 (*)    MCH 36.0 (*)    Platelets 120 (*)    All other components within normal limits  BASIC METABOLIC PANEL - Abnormal; Notable for the following components:   Calcium 8.8 (*)    All other components within normal limits  URINALYSIS, COMPLETE (UACMP) WITH MICROSCOPIC - Abnormal; Notable for the following components:   Color, Urine YELLOW (*)    APPearance CLEAR (*)    Hgb urine dipstick MODERATE (*)    Nitrite POSITIVE (*)    Leukocytes, UA SMALL (*)    Bacteria, UA MANY (*)    All  other components within normal limits  URINE CULTURE   ____________________________________________  EKG   ____________________________________________  RADIOLOGY   ____________________________________________   PROCEDURES  Procedure(s) performed:   SUPRAPUBIC TUBE PLACEMENT Date/Time: 04/02/2018 2:10 PM Performed by: Orbie Pyo, MD Authorized by: Orbie Pyo, MD   Consent:    Consent obtained:  Verbal   Consent given by:  Patient   Risks discussed:  Infection and pain   Alternatives discussed:  No treatment Procedure details:    Complexity:  Simple   Catheter type:  Foley   Catheter size:  16 Fr   Ultrasound guidance: no  Number of attempts:  1   Urine characteristics:  Yellow Post-procedure details:    Patient tolerance of procedure:  Tolerated well, no immediate complications Comments:     Removed the old Foley catheter which had what appears to be a mild amount of exudate as well as a mild amount of clotted blood on it.  Deflated balloon prior to removal.  Sterile technique used.  Once the Foley had been extracted yellow urine began to pour from the suprapubic stoma.  Likely several 100 cc.  The new suprapubic catheter was then inserted after cleansing the area with Betadine.  The balloon was inflated.    Critical Care performed:   ____________________________________________   INITIAL IMPRESSION / ASSESSMENT AND PLAN / ED COURSE  Pertinent labs & imaging results that were available during my care of the patient were reviewed by me and considered in my medical decision making (see chart for details).  DDX: Catheter obstruction, obstructive uropathy, UTI, sepsis As part of my medical decision making, I reviewed the following data within the [place] prior outpatient records.  ----------------------------------------- 2:13 PM on 04/02/2018 -----------------------------------------  Patient pain-free  at this time.  Nitrite positive urine.  Patient will be started on ciprofloxacin.  He is understanding of the diagnosis well treatment and willing to comply.  Will be discharged at this time.  Reassuring renal function.  Patient now appears improved.  No longer tachypneic or tachycardic. ____________________________________________   FINAL CLINICAL IMPRESSION(S) / ED DIAGNOSES  Catheter obstruction.  UTI.   NEW MEDICATIONS STARTED DURING THIS VISIT:  New Prescriptions   No medications on file     Note:  This document was prepared using Dragon voice recognition software and may include unintentional dictation errors.     Orbie Pyo, MD 04/02/18 1414

## 2018-04-02 NOTE — ED Triage Notes (Addendum)
Pt comes via taxi from home with c/o lower abdominal pain and urinary retention.  Pt states his suprapubic cathether is not draining. Pt states it was changed 2 weeks ago.   Pt states severe pain. Pt pacing around and unable to sit still. Respirations labored.  Pt has bone cancer and currently taking medications for it.  RN Janett Billow with bladder scan at bedside ([phone])

## 2018-04-03 DIAGNOSIS — C7951 Secondary malignant neoplasm of bone: Secondary | ICD-10-CM | POA: Diagnosis not present

## 2018-04-03 DIAGNOSIS — Z466 Encounter for fitting and adjustment of urinary device: Secondary | ICD-10-CM | POA: Diagnosis not present

## 2018-04-03 DIAGNOSIS — I10 Essential (primary) hypertension: Secondary | ICD-10-CM | POA: Diagnosis not present

## 2018-04-03 DIAGNOSIS — C61 Malignant neoplasm of prostate: Secondary | ICD-10-CM | POA: Diagnosis not present

## 2018-04-03 DIAGNOSIS — Z435 Encounter for attention to cystostomy: Secondary | ICD-10-CM | POA: Diagnosis not present

## 2018-04-03 DIAGNOSIS — I878 Other specified disorders of veins: Secondary | ICD-10-CM | POA: Diagnosis not present

## 2018-04-03 DIAGNOSIS — N390 Urinary tract infection, site not specified: Secondary | ICD-10-CM | POA: Diagnosis not present

## 2018-04-03 DIAGNOSIS — N401 Enlarged prostate with lower urinary tract symptoms: Secondary | ICD-10-CM | POA: Diagnosis not present

## 2018-04-03 DIAGNOSIS — R339 Retention of urine, unspecified: Secondary | ICD-10-CM | POA: Diagnosis not present

## 2018-04-03 NOTE — Progress Notes (Signed)
[facility]  [phone] [phone]  ID: Gerald Odonnell OB: 10/18/1954  MR#: [id number]  QIO#:[id number]  Patient Care Team: Lavera Guise, MD as PCP - General (Internal Medicine) Ronnell Freshwater, NP (Family Medicine)  CHIEF COMPLAINT: Prostate cancer metastatic to bone  INTERVAL HISTORY: Patient returns to clinic today for further evaluation and to assess his toleration of Zytiga plus prednisone.  He is tolerating treatment well without significant side effects.  He currently feels well and is asymptomatic.  He continues to have urinary retention and has a suprapubic tube.  He has no neurologic complaints.  He denies any recent fevers or illnesses.  He has a good appetite and denies weight loss.  He denies any pain.  He has no chest pain or shortness of breath.  He denies any nausea, vomiting, constipation, or diarrhea.  Patient offers no specific complaints today.  REVIEW OF SYSTEMS:   Review of Systems  Constitutional: Negative.  Negative for fever, malaise/fatigue and weight loss.  Respiratory: Negative.  Negative for cough, hemoptysis and shortness of breath.   Cardiovascular: Negative.  Negative for chest pain and leg swelling.  Gastrointestinal: Negative.  Negative for abdominal pain, blood in stool and melena.  Genitourinary: Negative.  Negative for dysuria and hematuria.  Musculoskeletal: Negative.  Negative for back pain.  Skin: Negative.  Negative for rash.  Neurological: Negative.  Negative for focal weakness, weakness and headaches.  Psychiatric/Behavioral: Negative.  The patient is not nervous/anxious.     As per HPI. Otherwise, a complete review of systems is negative.  PAST MEDICAL HISTORY: Past Medical History:  Diagnosis Date  . Cancer ([place])    colon  . Colon cancer ([place])    Hx of  . History of kidney stones   . Hypertension   . Prostate cancer ([place])   . Prostate cancer ([place])    Hx of   . S/P colon resection   .  Shortness of breath dyspnea   . Suprapubic catheter ([place])   . Urinary retention     PAST SURGICAL HISTORY: Past Surgical History:  Procedure Laterality Date  . ABDOMINAL SURGERY    . COLON SURGERY    . CYSTOSCOPY WITH LITHOLAPAXY N/A 06/09/2017   Procedure: CYSTOSCOPY WITH LITHOLAPAXY;  Surgeon: Hollice Espy, MD;  Location: [facility] ORS;  Service: Urology;  Laterality: N/A;  . PROSTATE SURGERY  05/25/2016  . SUPRAPUBIC CATHETER INSERTION      FAMILY HISTORY: Family History  Problem Relation Age of Onset  . Hypertension Other   . Alcohol abuse Mother   . Heart attack Father   . Prostate cancer Brother   . Breast cancer Sister     ADVANCED DIRECTIVES (Y/N):  N  HEALTH MAINTENANCE: Social History   Tobacco Use  . Smoking status: Current Every Day Smoker    Packs/day: 0.25    Years: 40.00    Pack years: 10.00    Types: Cigarettes  . Smokeless tobacco: Never Used  . Tobacco comment: 1pack a week  Substance Use Topics  . Alcohol use: Yes    Comment: occassional  . Drug use: No     Colonoscopy:  PAP:  Bone density:  Lipid panel:  No Known Allergies  Current Outpatient Medications  Medication Sig Dispense Refill  . abiraterone acetate (ZYTIGA) 250 MG tablet Take 4 tablets (1,000 mg total) by mouth daily. Take on an empty stomach 1 hour before or 2 hours after a meal. 120 tablet 3  . carvedilol (  COREG) 3.125 MG tablet Take 3.125 mg by mouth 2 (two) times daily.      No current facility-administered medications for this visit.     OBJECTIVE: Vitals:   04/06/18 1117  BP: (!) 158/95  Pulse: 90  Temp: 98.3 F (36.8 C)     Body mass index is 32.64 kg/m.    ECOG FS:0 - Asymptomatic  General: Well-developed, well-nourished, no acute distress. Eyes: Pink conjunctiva, anicteric sclera. HEENT: Normocephalic, moist mucous membranes. Lungs: Clear to auscultation bilaterally. Heart: Regular rate and rhythm. No rubs, murmurs, or gallops. Abdomen: Soft, nontender,  nondistended. No organomegaly noted, normoactive bowel sounds. Musculoskeletal: No edema, cyanosis, or clubbing. Neuro: Alert, answering all questions appropriately. Cranial nerves grossly intact. Skin: No rashes or petechiae noted. Psych: Normal affect.  LAB RESULTS:  Lab Results  Component Value Date   NA 140 04/06/2018   K 4.0 04/06/2018   CL 110 04/06/2018   CO2 24 04/06/2018   GLUCOSE 100 (H) 04/06/2018   BUN 9 04/06/2018   CREATININE 0.61 04/06/2018   CALCIUM 9.3 04/06/2018   PROT 7.2 04/06/2018   ALBUMIN 3.8 04/06/2018   AST 39 04/06/2018   ALT 29 04/06/2018   ALKPHOS 326 (H) 04/06/2018   BILITOT 0.9 04/06/2018   GFRNONAA >60 04/06/2018   GFRAA >60 04/06/2018    Lab Results  Component Value Date   WBC 6.5 04/06/2018   NEUTROABS 4.7 04/06/2018   HGB 14.6 04/06/2018   HCT 41.6 04/06/2018   MCV 100.0 04/06/2018   PLT 117 (L) 04/06/2018     STUDIES: No results found.  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  Patient's PSA has significantly trended down from 36.95-7.32.  In 2017 upon diagnosis, he reportedly received XRT along with brachii therapy.  He last received Lupron in September 2018.  Patient states he had allergic reaction to Lupron and refuses to take any further injections.  He also was given a prescription for Casodex by urology, but has refused to take this as well.  He states he will never take any chemotherapy, but has reluctantly agreed to initiate Zytiga plus prednisone.  Typically in castrate sensitive disease should be treated with both Zytiga and Lupron, but patient is refusing injections as above.  Continue Zytiga and prednisone as prescribed.  Return to clinic in 6 weeks for laboratory work only and then in 3 months for  laboratory work and further evaluation.    I spent a total of 30 minutes face-to-face with the patient of which greater than 50% of the visit was spent in counseling and coordination of care as detailed above.    Patient expressed understanding and was in agreement with this plan. He also understands that He can call clinic at any time with any questions, concerns, or complaints.   Cancer Staging Prostate cancer [facility]) Staging form: Prostate, AJCC 8th Edition - Clinical stage from 03/04/2018: Stage IVB (cT3a, cN0, cM1b, Grade Group: 5) - Signed by Lloyd Huger, MD on 03/04/2018   Lloyd Huger, MD   04/08/2018 12:43 PM

## 2018-04-04 ENCOUNTER — Inpatient Hospital Stay: Payer: Medicare HMO | Admitting: Oncology

## 2018-04-04 ENCOUNTER — Other Ambulatory Visit: Payer: Medicare HMO

## 2018-04-05 LAB — URINE CULTURE

## 2018-04-05 MED FILL — ABIRATERONE ACETATE 250 MG: 250 | 30 days supply | Qty: 120 | Fill #1

## 2018-04-05 MED FILL — predniSONE 5 MG TABS: 5 | 30 days supply | Qty: 30 | Fill #1

## 2018-04-06 ENCOUNTER — Other Ambulatory Visit: Payer: Self-pay

## 2018-04-06 ENCOUNTER — Inpatient Hospital Stay (HOSPITAL_BASED_OUTPATIENT_CLINIC_OR_DEPARTMENT_OTHER): Payer: Medicare HMO | Admitting: Oncology

## 2018-04-06 ENCOUNTER — Inpatient Hospital Stay: Payer: Medicare HMO | Attending: Oncology

## 2018-04-06 VITALS — BP 158/95 | HR 90 | Temp 98.3°F | Ht 70.0 in | Wt 227.5 lb

## 2018-04-06 DIAGNOSIS — C61 Malignant neoplasm of prostate: Secondary | ICD-10-CM | POA: Insufficient documentation

## 2018-04-06 DIAGNOSIS — I10 Essential (primary) hypertension: Secondary | ICD-10-CM | POA: Diagnosis not present

## 2018-04-06 DIAGNOSIS — Z79899 Other long term (current) drug therapy: Secondary | ICD-10-CM

## 2018-04-06 DIAGNOSIS — C7951 Secondary malignant neoplasm of bone: Secondary | ICD-10-CM

## 2018-04-06 DIAGNOSIS — Z85038 Personal history of other malignant neoplasm of large intestine: Secondary | ICD-10-CM

## 2018-04-06 DIAGNOSIS — F17210 Nicotine dependence, cigarettes, uncomplicated: Secondary | ICD-10-CM | POA: Diagnosis not present

## 2018-04-06 LAB — CBC WITH DIFFERENTIAL/PLATELET
ABS IMMATURE GRANULOCYTES: 0.02 10*3/uL (ref 0.00–0.07)
BASOS ABS: 0.1 10*3/uL (ref 0.0–0.1)
Basophils Relative: 1 %
EOS ABS: 0.1 10*3/uL (ref 0.0–0.5)
Eosinophils Relative: 1 %
HCT: 41.6 % (ref 39.0–52.0)
Hemoglobin: 14.6 g/dL (ref 13.0–17.0)
IMMATURE GRANULOCYTES: 0 %
LYMPHS PCT: 16 %
Lymphs Abs: 1 10*3/uL (ref 0.7–4.0)
MCH: 35.1 pg — ABNORMAL HIGH (ref 26.0–34.0)
MCHC: 35.1 g/dL (ref 30.0–36.0)
MCV: 100 fL (ref 80.0–100.0)
MONO ABS: 0.5 10*3/uL (ref 0.1–1.0)
MONOS PCT: 8 %
NEUTROS ABS: 4.7 10*3/uL (ref 1.7–7.7)
Neutrophils Relative %: 74 %
PLATELETS: 117 10*3/uL — AB (ref 150–400)
RBC: 4.16 MIL/uL — AB (ref 4.22–5.81)
RDW: 12 % (ref 11.5–15.5)
WBC: 6.5 10*3/uL (ref 4.0–10.5)
nRBC: 0 % (ref 0.0–0.2)

## 2018-04-06 LAB — COMPREHENSIVE METABOLIC PANEL
ALBUMIN: 3.8 g/dL (ref 3.5–5.0)
ALT: 29 U/L (ref 0–44)
AST: 39 U/L (ref 15–41)
Alkaline Phosphatase: 326 U/L — ABNORMAL HIGH (ref 38–126)
Anion gap: 6 (ref 5–15)
BUN: 9 mg/dL (ref 8–23)
CO2: 24 mmol/L (ref 22–32)
Calcium: 9.3 mg/dL (ref 8.9–10.3)
Chloride: 110 mmol/L (ref 98–111)
Creatinine, Ser: 0.61 mg/dL (ref 0.61–1.24)
GFR calc non Af Amer: >60 mL/min (ref >60)
Glucose, Bld: 100 mg/dL — ABNORMAL HIGH (ref 70–99)
POTASSIUM: 4 mmol/L (ref 3.5–5.1)
SODIUM: 140 mmol/L (ref 135–145)
Total Bilirubin: 0.9 mg/dL (ref 0.3–1.2)
Total Protein: 7.2 g/dL (ref 6.5–8.1)

## 2018-04-06 LAB — MAGNESIUM: Magnesium: 2.1 mg/dL (ref 1.7–2.4)

## 2018-04-06 LAB — PHOSPHORUS: PHOSPHORUS: 3.2 mg/dL (ref 2.5–4.6)

## 2018-04-06 LAB — PSA: PROSTATIC SPECIFIC ANTIGEN: 7.32 ng/mL — AB (ref 0.00–4.00)

## 2018-04-06 NOTE — Progress Notes (Signed)
Patient is here today to follow up on his prostate cancer. Patient stated that he has a continues abdominal pain but doesn't take anything for the pain. Patient denied fever, chills, nausea, vomiting, constipation, diarrhea and loss of appetite.

## 2018-04-07 DIAGNOSIS — N319 Neuromuscular dysfunction of bladder, unspecified: Secondary | ICD-10-CM | POA: Diagnosis not present

## 2018-04-15 ENCOUNTER — Ambulatory Visit: Payer: Medicare HMO | Admitting: Urology

## 2018-04-19 ENCOUNTER — Other Ambulatory Visit: Payer: Self-pay

## 2018-04-19 ENCOUNTER — Emergency Department
Admission: EM | Admit: 2018-04-19 | Discharge: 2018-04-19 | Disposition: A | Discharge status: Home / Self Care | Payer: Medicare HMO | Attending: Emergency Medicine | Admitting: Emergency Medicine

## 2018-04-19 ENCOUNTER — Emergency Department: Payer: Medicare HMO

## 2018-04-19 ENCOUNTER — Encounter: Payer: Self-pay | Admitting: Emergency Medicine

## 2018-04-19 DIAGNOSIS — R52 Pain, unspecified: Secondary | ICD-10-CM | POA: Diagnosis not present

## 2018-04-19 DIAGNOSIS — S8991XA Unspecified injury of right lower leg, initial encounter: Secondary | ICD-10-CM | POA: Diagnosis present

## 2018-04-19 DIAGNOSIS — Z23 Encounter for immunization: Secondary | ICD-10-CM | POA: Insufficient documentation

## 2018-04-19 DIAGNOSIS — Y929 Unspecified place or not applicable: Secondary | ICD-10-CM | POA: Diagnosis not present

## 2018-04-19 DIAGNOSIS — S81811A Laceration without foreign body, right lower leg, initial encounter: Secondary | ICD-10-CM | POA: Insufficient documentation

## 2018-04-19 DIAGNOSIS — I10 Essential (primary) hypertension: Secondary | ICD-10-CM | POA: Diagnosis not present

## 2018-04-19 DIAGNOSIS — S8254XA Nondisplaced fracture of medial malleolus of right tibia, initial encounter for closed fracture: Secondary | ICD-10-CM | POA: Diagnosis not present

## 2018-04-19 DIAGNOSIS — S82844A Nondisplaced bimalleolar fracture of right lower leg, initial encounter for closed fracture: Secondary | ICD-10-CM | POA: Diagnosis not present

## 2018-04-19 DIAGNOSIS — Y999 Unspecified external cause status: Secondary | ICD-10-CM | POA: Insufficient documentation

## 2018-04-19 DIAGNOSIS — W230XXA Caught, crushed, jammed, or pinched between moving objects, initial encounter: Secondary | ICD-10-CM | POA: Insufficient documentation

## 2018-04-19 DIAGNOSIS — Z79899 Other long term (current) drug therapy: Secondary | ICD-10-CM | POA: Diagnosis not present

## 2018-04-19 DIAGNOSIS — R000 Tachycardia, unspecified: Secondary | ICD-10-CM | POA: Diagnosis not present

## 2018-04-19 DIAGNOSIS — S82841A Displaced bimalleolar fracture of right lower leg, initial encounter for closed fracture: Secondary | ICD-10-CM | POA: Diagnosis not present

## 2018-04-19 DIAGNOSIS — F17210 Nicotine dependence, cigarettes, uncomplicated: Secondary | ICD-10-CM | POA: Insufficient documentation

## 2018-04-19 DIAGNOSIS — Y9389 Activity, other specified: Secondary | ICD-10-CM | POA: Diagnosis not present

## 2018-04-19 DIAGNOSIS — R58 Hemorrhage, not elsewhere classified: Secondary | ICD-10-CM | POA: Diagnosis not present

## 2018-04-19 DIAGNOSIS — R0902 Hypoxemia: Secondary | ICD-10-CM | POA: Diagnosis not present

## 2018-04-19 MED ORDER — TETANUS-DIPHTH-ACELL PERTUSSIS 5-2.5-18.5 LF-MCG/0.5 IM SUSP
0.5000 mL | Freq: Once | INTRAMUSCULAR | Status: AC
  Administered 2018-04-19: 0.5 mL via INTRAMUSCULAR
  Filled 2018-04-19: qty 0.5

## 2018-04-19 MED ORDER — CEFAZOLIN SODIUM-DEXTROSE 2-4 GM/100ML-% IV SOLN
2.0000 g | Freq: Once | INTRAVENOUS | Status: AC
  Administered 2018-04-19: 2 g via INTRAVENOUS
  Filled 2018-04-19: qty 100

## 2018-04-19 MED ORDER — HYDROMORPHONE HCL 1 MG/ML IJ SOLN
1.0000 mg | Freq: Once | INTRAMUSCULAR | Status: AC
  Administered 2018-04-19: 1 mg via INTRAVENOUS
  Filled 2018-04-19: qty 1

## 2018-04-19 MED ORDER — KETOROLAC TROMETHAMINE 30 MG/ML IJ SOLN
30.0000 mg | Freq: Once | INTRAMUSCULAR | Status: AC
  Administered 2018-04-19: 30 mg via INTRAVENOUS
  Filled 2018-04-19: qty 1

## 2018-04-19 MED ORDER — CEPHALEXIN 500 MG PO CAPS: 500.0000 mg | ORAL_CAPSULE | Freq: Three times a day (TID) | ORAL | 0 refills | Status: DC

## 2018-04-19 MED ORDER — HYDROMORPHONE HCL 1 MG/ML IJ SOLN: 1.0000 mg | Freq: Once | INTRAMUSCULAR | Status: DC

## 2018-04-19 MED ORDER — OXYCODONE-ACETAMINOPHEN 5-325 MG PO TABS
1.0000 | ORAL_TABLET | Freq: Once | ORAL | Status: AC
  Administered 2018-04-19: 1 via ORAL
  Filled 2018-04-19: qty 1

## 2018-04-19 MED ORDER — OXYCODONE-ACETAMINOPHEN 5-325 MG PO TABS: 1.0000 | ORAL_TABLET | Freq: Once | ORAL | Status: DC

## 2018-04-19 MED ORDER — SULFAMETHOXAZOLE-TRIMETHOPRIM 800-160 MG PO TABS: 1.0000 | ORAL_TABLET | Freq: Two times a day (BID) | ORAL | 0 refills | Status: DC

## 2018-04-19 MED ORDER — CEPHALEXIN 500 MG PO CAPS: 500.0000 mg | ORAL_CAPSULE | Freq: Once | ORAL | Status: DC

## 2018-04-19 MED ORDER — HYDROCODONE-ACETAMINOPHEN 5-325 MG PO TABS: 1.0000 | ORAL_TABLET | Freq: Three times a day (TID) | ORAL | 0 refills | Status: AC | PRN

## 2018-04-19 MED ORDER — LIDOCAINE HCL (PF) 1 % IJ SOLN
10.0000 mL | Freq: Once | INTRAMUSCULAR | Status: AC
  Administered 2018-04-19: 10 mL
  Filled 2018-04-19: qty 10

## 2018-04-19 MED ORDER — SULFAMETHOXAZOLE-TRIMETHOPRIM 800-160 MG PO TABS: 1.0000 | ORAL_TABLET | Freq: Once | ORAL | Status: DC

## 2018-04-19 NOTE — ED Notes (Signed)
Patient transported to X-ray 
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
## 2018-04-19 NOTE — Discharge Instructions (Signed)
You have been treated for an open wound to the lower leg. Your x-ray reveals fractures to the medial and lateral aspects of your ankle. You are being placed in a splint for support. The large leg wound has been repaired with staples. You should call Dr. Mack Guise for a wound check and fracture care after Christmas. Use a walker to walk bearing as little weight as possible on the leg/splint. Rest with the leg elevated when seated. Take the prescription antibiotic as directed an the pain medicine as needed.
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
## 2018-04-19 NOTE — ED Notes (Addendum)
PA at bedside to sew laceration back together.
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
## 2018-04-27 ENCOUNTER — Emergency Department
Admission: EM | Admit: 2018-04-27 | Discharge: 2018-04-27 | Disposition: A | Discharge status: Home / Self Care | Payer: Medicare HMO | Attending: Emergency Medicine | Admitting: Emergency Medicine

## 2018-04-27 ENCOUNTER — Other Ambulatory Visit: Payer: Self-pay

## 2018-04-27 DIAGNOSIS — Z960 Presence of urogenital implants: Secondary | ICD-10-CM | POA: Diagnosis not present

## 2018-04-27 DIAGNOSIS — T83198A Other mechanical complication of other urinary devices and implants, initial encounter: Secondary | ICD-10-CM | POA: Diagnosis present

## 2018-04-27 DIAGNOSIS — Z8546 Personal history of malignant neoplasm of prostate: Secondary | ICD-10-CM | POA: Diagnosis not present

## 2018-04-27 DIAGNOSIS — N390 Urinary tract infection, site not specified: Secondary | ICD-10-CM | POA: Diagnosis not present

## 2018-04-27 DIAGNOSIS — Z85038 Personal history of other malignant neoplasm of large intestine: Secondary | ICD-10-CM | POA: Insufficient documentation

## 2018-04-27 DIAGNOSIS — I10 Essential (primary) hypertension: Secondary | ICD-10-CM | POA: Insufficient documentation

## 2018-04-27 DIAGNOSIS — R319 Hematuria, unspecified: Secondary | ICD-10-CM | POA: Diagnosis not present

## 2018-04-27 DIAGNOSIS — F17210 Nicotine dependence, cigarettes, uncomplicated: Secondary | ICD-10-CM | POA: Insufficient documentation

## 2018-04-27 DIAGNOSIS — Y69 Unspecified misadventure during surgical and medical care: Secondary | ICD-10-CM | POA: Diagnosis not present

## 2018-04-27 LAB — CBC WITH DIFFERENTIAL/PLATELET
Abs Immature Granulocytes: 0.05 10*3/uL (ref 0.00–0.07)
BASOS PCT: 1 %
Basophils Absolute: 0.1 10*3/uL (ref 0.0–0.1)
Eosinophils Absolute: 0.2 10*3/uL (ref 0.0–0.5)
Eosinophils Relative: 2 %
HCT: 36.8 % — ABNORMAL LOW (ref 39.0–52.0)
Hemoglobin: 12.6 g/dL — ABNORMAL LOW (ref 13.0–17.0)
Immature Granulocytes: 1 %
Lymphocytes Relative: 14 %
Lymphs Abs: 1.3 10*3/uL (ref 0.7–4.0)
MCH: 36.1 pg — ABNORMAL HIGH (ref 26.0–34.0)
MCHC: 34.2 g/dL (ref 30.0–36.0)
MCV: 105.4 fL — ABNORMAL HIGH (ref 80.0–100.0)
Monocytes Absolute: 0.6 10*3/uL (ref 0.1–1.0)
Monocytes Relative: 7 %
NEUTROS ABS: 6.6 10*3/uL (ref 1.7–7.7)
NRBC: 0 % (ref 0.0–0.2)
Neutrophils Relative %: 75 %
PLATELETS: 234 10*3/uL (ref 150–400)
RBC: 3.49 MIL/uL — ABNORMAL LOW (ref 4.22–5.81)
RDW: 14 % (ref 11.5–15.5)
WBC: 8.9 10*3/uL (ref 4.0–10.5)

## 2018-04-27 LAB — COMPREHENSIVE METABOLIC PANEL
ALT: 21 U/L (ref 0–44)
AST: 37 U/L (ref 15–41)
Albumin: 3.8 g/dL (ref 3.5–5.0)
Alkaline Phosphatase: 235 U/L — ABNORMAL HIGH (ref 38–126)
Anion gap: 10 (ref 5–15)
BUN: 11 mg/dL (ref 8–23)
CO2: 21 mmol/L — AB (ref 22–32)
Calcium: 9.2 mg/dL (ref 8.9–10.3)
Chloride: 110 mmol/L (ref 98–111)
Creatinine, Ser: 0.67 mg/dL (ref 0.61–1.24)
GFR calc Af Amer: >60 mL/min (ref >60)
GFR calc non Af Amer: >60 mL/min (ref >60)
Glucose, Bld: 116 mg/dL — ABNORMAL HIGH (ref 70–99)
Potassium: 3.6 mmol/L (ref 3.5–5.1)
SODIUM: 141 mmol/L (ref 135–145)
Total Bilirubin: 2 mg/dL — ABNORMAL HIGH (ref 0.3–1.2)
Total Protein: 7.8 g/dL (ref 6.5–8.1)

## 2018-04-27 LAB — URINALYSIS, COMPLETE (UACMP) WITH MICROSCOPIC
Bacteria, UA: NONE SEEN
Bilirubin Urine: NEGATIVE
Glucose, UA: NEGATIVE mg/dL
Ketones, ur: NEGATIVE mg/dL
Leukocytes, UA: NEGATIVE
Nitrite: NEGATIVE
Protein, ur: 30 mg/dL — AB
RBC / HPF: >50 RBC/hpf — ABNORMAL HIGH (ref 0–5)
SPECIFIC GRAVITY, URINE: 1.026 (ref 1.005–1.030)
pH: 5 (ref 5.0–8.0)

## 2018-04-27 LAB — CK: Total CK: 46 U/L — ABNORMAL LOW (ref 49–397)

## 2018-04-27 MED ORDER — CEPHALEXIN 500 MG PO CAPS: 500.0000 mg | ORAL_CAPSULE | Freq: Four times a day (QID) | ORAL | 0 refills | Status: AC

## 2018-04-27 MED ORDER — HYDROCODONE-ACETAMINOPHEN 5-325 MG PO TABS: 2.0000 | ORAL_TABLET | Freq: Four times a day (QID) | ORAL | 0 refills | Status: DC | PRN

## 2018-04-27 MED ORDER — CEPHALEXIN 500 MG PO CAPS
1000.0000 mg | ORAL_CAPSULE | Freq: Once | ORAL | Status: AC
  Administered 2018-04-27: 1000 mg via ORAL
  Filled 2018-04-27: qty 2
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
## 2018-04-27 NOTE — Discharge Instructions (Addendum)
Please return for any further problems.  I would advise having the catheter changed possibly as often as every 2 weeks.  The Keflex 1 pill 4 times a day for UTI.  Return for any problems increasing pain fever vomiting etc.

## 2018-04-29 LAB — URINE CULTURE: Culture: <10000 — AB

## 2018-05-02 DIAGNOSIS — I878 Other specified disorders of veins: Secondary | ICD-10-CM | POA: Diagnosis not present

## 2018-05-02 DIAGNOSIS — C61 Malignant neoplasm of prostate: Secondary | ICD-10-CM | POA: Diagnosis not present

## 2018-05-02 DIAGNOSIS — Z466 Encounter for fitting and adjustment of urinary device: Secondary | ICD-10-CM | POA: Diagnosis not present

## 2018-05-02 DIAGNOSIS — N390 Urinary tract infection, site not specified: Secondary | ICD-10-CM | POA: Diagnosis not present

## 2018-05-02 DIAGNOSIS — Z435 Encounter for attention to cystostomy: Secondary | ICD-10-CM | POA: Diagnosis not present

## 2018-05-02 DIAGNOSIS — N401 Enlarged prostate with lower urinary tract symptoms: Secondary | ICD-10-CM | POA: Diagnosis not present

## 2018-05-02 DIAGNOSIS — C7951 Secondary malignant neoplasm of bone: Secondary | ICD-10-CM | POA: Diagnosis not present

## 2018-05-02 DIAGNOSIS — I10 Essential (primary) hypertension: Secondary | ICD-10-CM | POA: Diagnosis not present

## 2018-05-02 DIAGNOSIS — R339 Retention of urine, unspecified: Secondary | ICD-10-CM | POA: Diagnosis not present

## 2018-05-05 DIAGNOSIS — S82843A Displaced bimalleolar fracture of unspecified lower leg, initial encounter for closed fracture: Secondary | ICD-10-CM | POA: Insufficient documentation

## 2018-05-05 DIAGNOSIS — S82844A Nondisplaced bimalleolar fracture of right lower leg, initial encounter for closed fracture: Secondary | ICD-10-CM | POA: Diagnosis not present

## 2018-05-06 MED FILL — predniSONE 5 MG TABS: 5 | 30 days supply | Qty: 30 | Fill #2

## 2018-05-06 MED FILL — ABIRATERONE ACETATE 250 MG: 250 | 30 days supply | Qty: 120 | Fill #2

## 2018-05-13 DIAGNOSIS — S82844A Nondisplaced bimalleolar fracture of right lower leg, initial encounter for closed fracture: Secondary | ICD-10-CM | POA: Diagnosis not present

## 2018-05-18 ENCOUNTER — Inpatient Hospital Stay: Payer: Medicare HMO

## 2018-05-19 DIAGNOSIS — C61 Malignant neoplasm of prostate: Secondary | ICD-10-CM | POA: Diagnosis not present

## 2018-05-19 DIAGNOSIS — R339 Retention of urine, unspecified: Secondary | ICD-10-CM | POA: Diagnosis not present

## 2018-05-19 DIAGNOSIS — Z466 Encounter for fitting and adjustment of urinary device: Secondary | ICD-10-CM | POA: Diagnosis not present

## 2018-05-19 DIAGNOSIS — N390 Urinary tract infection, site not specified: Secondary | ICD-10-CM | POA: Diagnosis not present

## 2018-05-19 DIAGNOSIS — Z435 Encounter for attention to cystostomy: Secondary | ICD-10-CM | POA: Diagnosis not present

## 2018-05-19 DIAGNOSIS — C7951 Secondary malignant neoplasm of bone: Secondary | ICD-10-CM | POA: Diagnosis not present

## 2018-05-19 DIAGNOSIS — I10 Essential (primary) hypertension: Secondary | ICD-10-CM | POA: Diagnosis not present

## 2018-05-19 DIAGNOSIS — I878 Other specified disorders of veins: Secondary | ICD-10-CM | POA: Diagnosis not present

## 2018-05-19 DIAGNOSIS — N401 Enlarged prostate with lower urinary tract symptoms: Secondary | ICD-10-CM | POA: Diagnosis not present

## 2018-05-25 DIAGNOSIS — N319 Neuromuscular dysfunction of bladder, unspecified: Secondary | ICD-10-CM | POA: Diagnosis not present

## 2018-05-27 DIAGNOSIS — C61 Malignant neoplasm of prostate: Secondary | ICD-10-CM | POA: Diagnosis not present

## 2018-05-27 DIAGNOSIS — N401 Enlarged prostate with lower urinary tract symptoms: Secondary | ICD-10-CM | POA: Diagnosis not present

## 2018-05-27 DIAGNOSIS — I878 Other specified disorders of veins: Secondary | ICD-10-CM | POA: Diagnosis not present

## 2018-05-27 DIAGNOSIS — N390 Urinary tract infection, site not specified: Secondary | ICD-10-CM | POA: Diagnosis not present

## 2018-05-27 DIAGNOSIS — Z466 Encounter for fitting and adjustment of urinary device: Secondary | ICD-10-CM | POA: Diagnosis not present

## 2018-05-27 DIAGNOSIS — I10 Essential (primary) hypertension: Secondary | ICD-10-CM | POA: Diagnosis not present

## 2018-05-27 DIAGNOSIS — R339 Retention of urine, unspecified: Secondary | ICD-10-CM | POA: Diagnosis not present

## 2018-05-27 DIAGNOSIS — C7951 Secondary malignant neoplasm of bone: Secondary | ICD-10-CM | POA: Diagnosis not present

## 2018-05-27 DIAGNOSIS — Z435 Encounter for attention to cystostomy: Secondary | ICD-10-CM | POA: Diagnosis not present

## 2018-05-30 ENCOUNTER — Telehealth: Payer: Self-pay | Admitting: Urology

## 2018-05-30 DIAGNOSIS — C61 Malignant neoplasm of prostate: Secondary | ICD-10-CM | POA: Diagnosis not present

## 2018-05-30 DIAGNOSIS — N401 Enlarged prostate with lower urinary tract symptoms: Secondary | ICD-10-CM | POA: Diagnosis not present

## 2018-05-30 DIAGNOSIS — N390 Urinary tract infection, site not specified: Secondary | ICD-10-CM | POA: Diagnosis not present

## 2018-05-30 DIAGNOSIS — C7951 Secondary malignant neoplasm of bone: Secondary | ICD-10-CM | POA: Diagnosis not present

## 2018-05-30 DIAGNOSIS — I878 Other specified disorders of veins: Secondary | ICD-10-CM | POA: Diagnosis not present

## 2018-05-30 DIAGNOSIS — Z435 Encounter for attention to cystostomy: Secondary | ICD-10-CM | POA: Diagnosis not present

## 2018-05-30 DIAGNOSIS — R339 Retention of urine, unspecified: Secondary | ICD-10-CM | POA: Diagnosis not present

## 2018-05-30 DIAGNOSIS — I10 Essential (primary) hypertension: Secondary | ICD-10-CM | POA: Diagnosis not present

## 2018-05-30 DIAGNOSIS — Z466 Encounter for fitting and adjustment of urinary device: Secondary | ICD-10-CM | POA: Diagnosis not present

## 2018-05-30 NOTE — Telephone Encounter (Signed)
Gerald Odonnell from [facility] [phone]) needs orders for patient to have SPT tube exchanged 1 x per month.  She can take a verbal order.

## 2018-05-31 NOTE — Telephone Encounter (Signed)
Spoke with Jenny Reichmann from [facility] and gave a verbal order for chronic SPT tube exchange monthly

## 2018-06-01 DIAGNOSIS — I10 Essential (primary) hypertension: Secondary | ICD-10-CM | POA: Diagnosis not present

## 2018-06-01 DIAGNOSIS — R339 Retention of urine, unspecified: Secondary | ICD-10-CM | POA: Diagnosis not present

## 2018-06-01 DIAGNOSIS — Z466 Encounter for fitting and adjustment of urinary device: Secondary | ICD-10-CM | POA: Diagnosis not present

## 2018-06-01 DIAGNOSIS — I878 Other specified disorders of veins: Secondary | ICD-10-CM | POA: Diagnosis not present

## 2018-06-01 DIAGNOSIS — C61 Malignant neoplasm of prostate: Secondary | ICD-10-CM | POA: Diagnosis not present

## 2018-06-01 DIAGNOSIS — N390 Urinary tract infection, site not specified: Secondary | ICD-10-CM | POA: Diagnosis not present

## 2018-06-01 DIAGNOSIS — C7951 Secondary malignant neoplasm of bone: Secondary | ICD-10-CM | POA: Diagnosis not present

## 2018-06-01 DIAGNOSIS — Z435 Encounter for attention to cystostomy: Secondary | ICD-10-CM | POA: Diagnosis not present

## 2018-06-01 DIAGNOSIS — N401 Enlarged prostate with lower urinary tract symptoms: Secondary | ICD-10-CM | POA: Diagnosis not present

## 2018-06-06 DIAGNOSIS — R339 Retention of urine, unspecified: Secondary | ICD-10-CM | POA: Diagnosis not present

## 2018-06-06 DIAGNOSIS — C7951 Secondary malignant neoplasm of bone: Secondary | ICD-10-CM | POA: Diagnosis not present

## 2018-06-06 DIAGNOSIS — C61 Malignant neoplasm of prostate: Secondary | ICD-10-CM | POA: Diagnosis not present

## 2018-06-06 DIAGNOSIS — Z466 Encounter for fitting and adjustment of urinary device: Secondary | ICD-10-CM | POA: Diagnosis not present

## 2018-06-06 DIAGNOSIS — N390 Urinary tract infection, site not specified: Secondary | ICD-10-CM | POA: Diagnosis not present

## 2018-06-06 DIAGNOSIS — I10 Essential (primary) hypertension: Secondary | ICD-10-CM | POA: Diagnosis not present

## 2018-06-06 DIAGNOSIS — I878 Other specified disorders of veins: Secondary | ICD-10-CM | POA: Diagnosis not present

## 2018-06-06 DIAGNOSIS — Z435 Encounter for attention to cystostomy: Secondary | ICD-10-CM | POA: Diagnosis not present

## 2018-06-06 DIAGNOSIS — N401 Enlarged prostate with lower urinary tract symptoms: Secondary | ICD-10-CM | POA: Diagnosis not present

## 2018-06-06 DIAGNOSIS — N319 Neuromuscular dysfunction of bladder, unspecified: Secondary | ICD-10-CM | POA: Diagnosis not present

## 2018-06-07 DIAGNOSIS — C7951 Secondary malignant neoplasm of bone: Secondary | ICD-10-CM | POA: Diagnosis not present

## 2018-06-07 DIAGNOSIS — N390 Urinary tract infection, site not specified: Secondary | ICD-10-CM | POA: Diagnosis not present

## 2018-06-07 DIAGNOSIS — Z466 Encounter for fitting and adjustment of urinary device: Secondary | ICD-10-CM | POA: Diagnosis not present

## 2018-06-07 DIAGNOSIS — S82844A Nondisplaced bimalleolar fracture of right lower leg, initial encounter for closed fracture: Secondary | ICD-10-CM | POA: Diagnosis not present

## 2018-06-07 DIAGNOSIS — R339 Retention of urine, unspecified: Secondary | ICD-10-CM | POA: Diagnosis not present

## 2018-06-07 DIAGNOSIS — N401 Enlarged prostate with lower urinary tract symptoms: Secondary | ICD-10-CM | POA: Diagnosis not present

## 2018-06-07 DIAGNOSIS — I10 Essential (primary) hypertension: Secondary | ICD-10-CM | POA: Diagnosis not present

## 2018-06-07 DIAGNOSIS — Z435 Encounter for attention to cystostomy: Secondary | ICD-10-CM | POA: Diagnosis not present

## 2018-06-07 DIAGNOSIS — I878 Other specified disorders of veins: Secondary | ICD-10-CM | POA: Diagnosis not present

## 2018-06-07 DIAGNOSIS — C61 Malignant neoplasm of prostate: Secondary | ICD-10-CM | POA: Diagnosis not present

## 2018-06-08 ENCOUNTER — Telehealth: Payer: Self-pay | Admitting: Urology

## 2018-06-08 NOTE — Telephone Encounter (Signed)
[facility] home health called on behalf of this patient asking if he could have something for bladder spasms called in to in pharmacy.   Sharyn Lull

## 2018-06-08 NOTE — Telephone Encounter (Signed)
I know patient has tried several medications. Is there something we can send in for him?

## 2018-06-09 MED ORDER — OXYBUTYNIN CHLORIDE 5 MG PO TABS: 5.0000 mg | ORAL_TABLET | Freq: Two times a day (BID) | ORAL | 0 refills | Status: DC

## 2018-06-09 MED FILL — ABIRATERONE ACETATE 250 MG: 250 | 30 days supply | Qty: 120 | Fill #3

## 2018-06-09 MED FILL — predniSONE 5 MG TABS: 5 | 30 days supply | Qty: 30 | Fill #3

## 2018-06-09 NOTE — Telephone Encounter (Signed)
Patient states he just wanted a refill of Oxybutynin. He states they are helpful and he was out. Home health did not relay that information correctly. I filled RX for patient.

## 2018-06-09 NOTE — Telephone Encounter (Signed)
He is tried numerous medications in the past none of which have been helpful.  I would mow strongly recommend just opening up his SP tube to drainage rather than capping it like he does.  This is been my recommendation previously as well.  Hollice Espy, MD

## 2018-06-15 DIAGNOSIS — N401 Enlarged prostate with lower urinary tract symptoms: Secondary | ICD-10-CM | POA: Diagnosis not present

## 2018-06-15 DIAGNOSIS — I878 Other specified disorders of veins: Secondary | ICD-10-CM | POA: Diagnosis not present

## 2018-06-15 DIAGNOSIS — Z466 Encounter for fitting and adjustment of urinary device: Secondary | ICD-10-CM | POA: Diagnosis not present

## 2018-06-15 DIAGNOSIS — C7951 Secondary malignant neoplasm of bone: Secondary | ICD-10-CM | POA: Diagnosis not present

## 2018-06-15 DIAGNOSIS — I10 Essential (primary) hypertension: Secondary | ICD-10-CM | POA: Diagnosis not present

## 2018-06-15 DIAGNOSIS — Z435 Encounter for attention to cystostomy: Secondary | ICD-10-CM | POA: Diagnosis not present

## 2018-06-15 DIAGNOSIS — C61 Malignant neoplasm of prostate: Secondary | ICD-10-CM | POA: Diagnosis not present

## 2018-06-15 DIAGNOSIS — R339 Retention of urine, unspecified: Secondary | ICD-10-CM | POA: Diagnosis not present

## 2018-06-15 DIAGNOSIS — N390 Urinary tract infection, site not specified: Secondary | ICD-10-CM | POA: Diagnosis not present

## 2018-07-01 ENCOUNTER — Other Ambulatory Visit: Payer: Self-pay | Admitting: Oncology

## 2018-07-01 DIAGNOSIS — N401 Enlarged prostate with lower urinary tract symptoms: Secondary | ICD-10-CM | POA: Diagnosis not present

## 2018-07-01 DIAGNOSIS — C61 Malignant neoplasm of prostate: Secondary | ICD-10-CM | POA: Diagnosis not present

## 2018-07-01 DIAGNOSIS — I10 Essential (primary) hypertension: Secondary | ICD-10-CM | POA: Diagnosis not present

## 2018-07-01 DIAGNOSIS — I878 Other specified disorders of veins: Secondary | ICD-10-CM | POA: Diagnosis not present

## 2018-07-01 DIAGNOSIS — Z435 Encounter for attention to cystostomy: Secondary | ICD-10-CM | POA: Diagnosis not present

## 2018-07-01 DIAGNOSIS — Z466 Encounter for fitting and adjustment of urinary device: Secondary | ICD-10-CM | POA: Diagnosis not present

## 2018-07-01 DIAGNOSIS — N390 Urinary tract infection, site not specified: Secondary | ICD-10-CM | POA: Diagnosis not present

## 2018-07-01 DIAGNOSIS — C7951 Secondary malignant neoplasm of bone: Secondary | ICD-10-CM | POA: Diagnosis not present

## 2018-07-01 DIAGNOSIS — R339 Retention of urine, unspecified: Secondary | ICD-10-CM | POA: Diagnosis not present

## 2018-07-03 NOTE — Progress Notes (Deleted)
Gerald Odonnell  Telephone:(336) [phone] Fax:(336) [phone]  ID: Jaycee K Martinique OB: 10/18/1954  MR#: [id number]  LPF#:[id number]  Patient Care Team: Lavera Guise, MD as PCP - General (Internal Medicine) Ronnell Freshwater, NP (Family Medicine)  CHIEF COMPLAINT: Prostate cancer metastatic to bone  INTERVAL HISTORY: Patient returns to clinic today for further evaluation and to assess his toleration of Zytiga plus prednisone.  He is tolerating treatment well without significant side effects.  He currently feels well and is asymptomatic.  He continues to have urinary retention and has a suprapubic tube.  He has no neurologic complaints.  He denies any recent fevers or illnesses.  He has a good appetite and denies weight loss.  He denies any pain.  He has no chest pain or shortness of breath.  He denies any nausea, vomiting, constipation, or diarrhea.  Patient offers no specific complaints today.  REVIEW OF SYSTEMS:   Review of Systems  Constitutional: Negative.  Negative for fever, malaise/fatigue and weight loss.  Respiratory: Negative.  Negative for cough, hemoptysis and shortness of breath.   Cardiovascular: Negative.  Negative for chest pain and leg swelling.  Gastrointestinal: Negative.  Negative for abdominal pain, blood in stool and melena.  Genitourinary: Negative.  Negative for dysuria and hematuria.  Musculoskeletal: Negative.  Negative for back pain.  Skin: Negative.  Negative for rash.  Neurological: Negative.  Negative for focal weakness, weakness and headaches.  Psychiatric/Behavioral: Negative.  The patient is not nervous/anxious.     As per HPI. Otherwise, a complete review of systems is negative.  PAST MEDICAL HISTORY: Past Medical History:  Diagnosis Date  . Cancer ([place])    colon  . Colon cancer ([place])    Hx of  . History of kidney stones   . Hypertension   . Prostate cancer ([place])   . Prostate cancer ([place])    Hx of   . S/P colon resection   .  Shortness of breath dyspnea   . Suprapubic catheter ([place])   . Urinary retention     PAST SURGICAL HISTORY: Past Surgical History:  Procedure Laterality Date  . ABDOMINAL SURGERY    . COLON SURGERY    . CYSTOSCOPY WITH LITHOLAPAXY N/A 06/09/2017   Procedure: CYSTOSCOPY WITH LITHOLAPAXY;  Surgeon: Hollice Espy, MD;  Location: [facility] ORS;  Service: Urology;  Laterality: N/A;  . PROSTATE SURGERY  05/25/2016  . SUPRAPUBIC CATHETER INSERTION      FAMILY HISTORY: Family History  Problem Relation Age of Onset  . Hypertension Other   . Alcohol abuse Mother   . Heart attack Father   . Prostate cancer Brother   . Breast cancer Sister     ADVANCED DIRECTIVES (Y/N):  N  HEALTH MAINTENANCE: Social History   Tobacco Use  . Smoking status: Current Every Day Smoker    Packs/day: 0.25    Years: 40.00    Pack years: 10.00    Types: Cigarettes  . Smokeless tobacco: Never Used  . Tobacco comment: 1pack a week  Substance Use Topics  . Alcohol use: Yes    Comment: occassional  . Drug use: No     Colonoscopy:  PAP:  Bone density:  Lipid panel:  No Known Allergies  Current Outpatient Medications  Medication Sig Dispense Refill  . abiraterone acetate (ZYTIGA) 250 MG tablet TAKE 4 TABLETS (1,000 MG TOTAL) BY MOUTH DAILY. TAKE ON AN EMPTY STOMACH 1 HOUR BEFORE OR 2 HOURS AFTER A MEAL. 120 tablet 3  . carvedilol (  COREG) 3.125 MG tablet Take 3.125 mg by mouth 2 (two) times daily.     . cephALEXin (KEFLEX) 500 MG capsule Take 1 capsule (500 mg total) by mouth 3 (three) times daily. 21 capsule 0  . HYDROcodone-acetaminophen (NORCO/VICODIN) 5-325 MG tablet Take 2 tablets by mouth every 6 (six) hours as needed for moderate pain. 10 tablet 0  . oxybutynin (DITROPAN) 5 MG tablet Take 1 tablet (5 mg total) by mouth 2 (two) times daily. 60 tablet 0  . predniSONE (DELTASONE) 5 MG tablet TAKE 1 TABLET BY MOUTH ONCE DAILY WITH BREAKFAST 30 tablet 3  . sulfamethoxazole-trimethoprim (BACTRIM  DS,SEPTRA DS) 800-160 MG tablet Take 1 tablet by mouth 2 (two) times daily. 20 tablet 0   No current facility-administered medications for this visit.     OBJECTIVE: There were no vitals filed for this visit.   There is no height or weight on file to calculate BMI.    ECOG FS:0 - Asymptomatic  General: Well-developed, well-nourished, no acute distress. Eyes: Pink conjunctiva, anicteric sclera. HEENT: Normocephalic, moist mucous membranes. Lungs: Clear to auscultation bilaterally. Heart: Regular rate and rhythm. No rubs, murmurs, or gallops. Abdomen: Soft, nontender, nondistended. No organomegaly noted, normoactive bowel sounds. Musculoskeletal: No edema, cyanosis, or clubbing. Neuro: Alert, answering all questions appropriately. Cranial nerves grossly intact. Skin: No rashes or petechiae noted. Psych: Normal affect.  LAB RESULTS:  Lab Results  Component Value Date   NA 141 04/27/2018   K 3.6 04/27/2018   CL 110 04/27/2018   CO2 21 (L) 04/27/2018   GLUCOSE 116 (H) 04/27/2018   BUN 11 04/27/2018   CREATININE 0.67 04/27/2018   CALCIUM 9.2 04/27/2018   PROT 7.8 04/27/2018   ALBUMIN 3.8 04/27/2018   AST 37 04/27/2018   ALT 21 04/27/2018   ALKPHOS 235 (H) 04/27/2018   BILITOT 2.0 (H) 04/27/2018   GFRNONAA >60 04/27/2018   GFRAA >60 04/27/2018    Lab Results  Component Value Date   WBC 8.9 04/27/2018   NEUTROABS 6.6 04/27/2018   HGB 12.6 (L) 04/27/2018   HCT 36.8 (L) 04/27/2018   MCV 105.4 (H) 04/27/2018   PLT 234 04/27/2018     STUDIES: No results found.  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  Patient's PSA has significantly trended down from 36.95-7.32.  In 2017 upon diagnosis, he reportedly received XRT along with brachii therapy.  He  last received Lupron in September 2018.  Patient states he had allergic reaction to Lupron and refuses to take any further injections.  He also was given a prescription for Casodex by urology, but has refused to take this as well.  He states he will never take any chemotherapy, but has reluctantly agreed to initiate Zytiga plus prednisone.  Typically in castrate sensitive disease should be treated with both Zytiga and Lupron, but patient is refusing injections as above.  Continue Zytiga and prednisone as prescribed.  Return to clinic in 6 weeks for laboratory work only and then in 3 months for laboratory work and further evaluation.    I spent a total of 30 minutes face-to-face with the patient of which greater than 50% of the visit was spent in counseling and coordination of care as detailed above.    Patient expressed understanding and was in agreement  with this plan. He also understands that He can call clinic at any time with any questions, concerns, or complaints.   Cancer Staging Prostate cancer [facility]) Staging form: Prostate, AJCC 8th Edition - Clinical stage from 03/04/2018: Stage IVB (cT3a, cN0, cM1b, Grade Group: 5) - Signed by Lloyd Huger, MD on 03/04/2018   Lloyd Huger, MD   07/03/2018 5:27 PM

## 2018-07-06 ENCOUNTER — Inpatient Hospital Stay: Payer: Medicare HMO | Admitting: Oncology

## 2018-07-06 ENCOUNTER — Other Ambulatory Visit: Payer: Self-pay

## 2018-07-06 ENCOUNTER — Inpatient Hospital Stay: Payer: Medicare HMO | Attending: Oncology

## 2018-07-06 DIAGNOSIS — F17210 Nicotine dependence, cigarettes, uncomplicated: Secondary | ICD-10-CM | POA: Diagnosis not present

## 2018-07-06 DIAGNOSIS — Z800 Family history of malignant neoplasm of digestive organs: Secondary | ICD-10-CM | POA: Diagnosis not present

## 2018-07-06 DIAGNOSIS — N319 Neuromuscular dysfunction of bladder, unspecified: Secondary | ICD-10-CM | POA: Diagnosis not present

## 2018-07-06 DIAGNOSIS — C61 Malignant neoplasm of prostate: Secondary | ICD-10-CM | POA: Insufficient documentation

## 2018-07-06 DIAGNOSIS — C7951 Secondary malignant neoplasm of bone: Secondary | ICD-10-CM | POA: Insufficient documentation

## 2018-07-06 LAB — COMPREHENSIVE METABOLIC PANEL
ALK PHOS: 126 U/L (ref 38–126)
ALT: 19 U/L (ref 0–44)
AST: 28 U/L (ref 15–41)
Albumin: 4 g/dL (ref 3.5–5.0)
Anion gap: 5 (ref 5–15)
BUN: 14 mg/dL (ref 8–23)
CO2: 27 mmol/L (ref 22–32)
Calcium: 9.3 mg/dL (ref 8.9–10.3)
Chloride: 107 mmol/L (ref 98–111)
Creatinine, Ser: 0.82 mg/dL (ref 0.61–1.24)
GFR calc Af Amer: >60 mL/min (ref >60)
GFR calc non Af Amer: >60 mL/min (ref >60)
Glucose, Bld: 132 mg/dL — ABNORMAL HIGH (ref 70–99)
Potassium: 4.4 mmol/L (ref 3.5–5.1)
Sodium: 139 mmol/L (ref 135–145)
Total Bilirubin: 1.3 mg/dL — ABNORMAL HIGH (ref 0.3–1.2)
Total Protein: 7.8 g/dL (ref 6.5–8.1)

## 2018-07-06 LAB — MAGNESIUM: Magnesium: 2.2 mg/dL (ref 1.7–2.4)

## 2018-07-06 LAB — CBC WITH DIFFERENTIAL/PLATELET
Abs Immature Granulocytes: 0.02 10*3/uL (ref 0.00–0.07)
Basophils Absolute: 0 10*3/uL (ref 0.0–0.1)
Basophils Relative: 1 %
EOS ABS: 0 10*3/uL (ref 0.0–0.5)
Eosinophils Relative: 1 %
HCT: 43.7 % (ref 39.0–52.0)
Hemoglobin: 15.1 g/dL (ref 13.0–17.0)
IMMATURE GRANULOCYTES: 0 %
Lymphocytes Relative: 19 %
Lymphs Abs: 1.4 10*3/uL (ref 0.7–4.0)
MCH: 35 pg — ABNORMAL HIGH (ref 26.0–34.0)
MCHC: 34.6 g/dL (ref 30.0–36.0)
MCV: 101.2 fL — AB (ref 80.0–100.0)
MONOS PCT: 7 %
Monocytes Absolute: 0.5 10*3/uL (ref 0.1–1.0)
Neutro Abs: 5.2 10*3/uL (ref 1.7–7.7)
Neutrophils Relative %: 72 %
Platelets: 125 10*3/uL — ABNORMAL LOW (ref 150–400)
RBC: 4.32 MIL/uL (ref 4.22–5.81)
RDW: 12.1 % (ref 11.5–15.5)
WBC: 7.2 10*3/uL (ref 4.0–10.5)
nRBC: 0 % (ref 0.0–0.2)

## 2018-07-06 LAB — PHOSPHORUS: PHOSPHORUS: 2.9 mg/dL (ref 2.5–4.6)

## 2018-07-06 LAB — PSA: Prostatic Specific Antigen: 3.46 ng/mL (ref 0.00–4.00)

## 2018-07-11 ENCOUNTER — Telehealth: Payer: Self-pay | Admitting: Urology

## 2018-07-11 MED FILL — ABIRATERONE ACETATE 250 MG: 250 | 30 days supply | Qty: 120 | Fill #0

## 2018-07-11 MED FILL — predniSONE 5 MG TABS: 5 | 30 days supply | Qty: 30 | Fill #0

## 2018-07-11 NOTE — Telephone Encounter (Signed)
Patient contacted the office today to cancel his PSA/cysto appointment with Dr. Erlene Quan on Wednesday 3/18.  Patient states that he had his PSA checked on 07/06/18 and it was 3.46.  He does not want to have a cysto at this time.  He states that he will follow up in May as scheduled.  I changed that appointment to a "6 month follow up with possible cysto".    Just FYI.Marland KitchenMarland KitchenMarland Kitchen

## 2018-07-11 NOTE — Telephone Encounter (Signed)
This patient has metastatic prostate cancer and needs to be seen on a regular basis.  Please call him and reiterated the importance of routine follow-up.  Please ensure that he is in fact still taking his Zytiga and prednisone daily.  Gerald Espy, MD

## 2018-07-11 NOTE — Telephone Encounter (Signed)
Patient notified and he is taking his Zytiga and prednisone daily. He does not have his copay or transportation at this time but will follow up in May for a cysto follow up

## 2018-07-13 ENCOUNTER — Other Ambulatory Visit: Payer: Medicare HMO | Admitting: Urology

## 2018-07-13 DIAGNOSIS — I10 Essential (primary) hypertension: Secondary | ICD-10-CM | POA: Diagnosis not present

## 2018-07-13 DIAGNOSIS — C61 Malignant neoplasm of prostate: Secondary | ICD-10-CM | POA: Diagnosis not present

## 2018-07-13 DIAGNOSIS — Z466 Encounter for fitting and adjustment of urinary device: Secondary | ICD-10-CM | POA: Diagnosis not present

## 2018-07-13 DIAGNOSIS — R339 Retention of urine, unspecified: Secondary | ICD-10-CM | POA: Diagnosis not present

## 2018-07-13 DIAGNOSIS — I878 Other specified disorders of veins: Secondary | ICD-10-CM | POA: Diagnosis not present

## 2018-07-13 DIAGNOSIS — Z435 Encounter for attention to cystostomy: Secondary | ICD-10-CM | POA: Diagnosis not present

## 2018-07-13 DIAGNOSIS — C7951 Secondary malignant neoplasm of bone: Secondary | ICD-10-CM | POA: Diagnosis not present

## 2018-07-13 DIAGNOSIS — N390 Urinary tract infection, site not specified: Secondary | ICD-10-CM | POA: Diagnosis not present

## 2018-07-13 DIAGNOSIS — N401 Enlarged prostate with lower urinary tract symptoms: Secondary | ICD-10-CM | POA: Diagnosis not present

## 2018-07-13 NOTE — Progress Notes (Signed)
[place]  Telephone:(336) [phone] Fax:(336) [phone]  ID: Regina K Martinique OB: 10/18/1954  MR#: [id number]  WNI#:[id number]  Patient Care Team: Lavera Guise, MD as PCP - General (Internal Medicine) Ronnell Freshwater, NP (Family Medicine)  CHIEF COMPLAINT: Prostate cancer metastatic to bone  INTERVAL HISTORY: Patient returns to clinic today for further evaluation, discussion of her laboratory work, and assess his toleration of Zytiga and prednisone.  He continues to tolerate them well without significant side effects. He continues to have urinary retention and has a suprapubic tube.  He has no neurologic complaints.  He denies any recent fevers or illnesses.  He has a good appetite and denies weight loss.  He denies any pain.  He has no chest pain or shortness of breath.  He denies any nausea, vomiting, constipation, or diarrhea.  Patient offers no further specific complaints today.  REVIEW OF SYSTEMS:   Review of Systems  Constitutional: Negative.  Negative for fever, malaise/fatigue and weight loss.  Respiratory: Negative.  Negative for cough, hemoptysis and shortness of breath.   Cardiovascular: Negative.  Negative for chest pain and leg swelling.  Gastrointestinal: Negative.  Negative for abdominal pain, blood in stool and melena.  Genitourinary: Negative.  Negative for dysuria and hematuria.  Musculoskeletal: Negative.  Negative for back pain.  Skin: Negative.  Negative for rash.  Neurological: Negative.  Negative for focal weakness, weakness and headaches.  Psychiatric/Behavioral: Negative.  The patient is not nervous/anxious.     As per HPI. Otherwise, a complete review of systems is negative.  PAST MEDICAL HISTORY: Past Medical History:  Diagnosis Date  . Cancer ([place])    colon  . Colon cancer ([place])    Hx of  . History of kidney stones   . Hypertension   . Prostate cancer ([place])   . Prostate cancer ([place])    Hx of   . S/P colon resection   .  Shortness of breath dyspnea   . Suprapubic catheter ([place])   . Urinary retention     PAST SURGICAL HISTORY: Past Surgical History:  Procedure Laterality Date  . ABDOMINAL SURGERY    . COLON SURGERY    . CYSTOSCOPY WITH LITHOLAPAXY N/A 06/09/2017   Procedure: CYSTOSCOPY WITH LITHOLAPAXY;  Surgeon: Hollice Espy, MD;  Location: [facility] ORS;  Service: Urology;  Laterality: N/A;  . PROSTATE SURGERY  05/25/2016  . SUPRAPUBIC CATHETER INSERTION      FAMILY HISTORY: Family History  Problem Relation Age of Onset  . Hypertension Other   . Alcohol abuse Mother   . Heart attack Father   . Prostate cancer Brother   . Breast cancer Sister     ADVANCED DIRECTIVES (Y/N):  N  HEALTH MAINTENANCE: Social History   Tobacco Use  . Smoking status: Current Every Day Smoker    Packs/day: 0.25    Years: 40.00    Pack years: 10.00    Types: Cigarettes  . Smokeless tobacco: Never Used  . Tobacco comment: 1pack a week  Substance Use Topics  . Alcohol use: Yes    Comment: occassional  . Drug use: No     Colonoscopy:  PAP:  Bone density:  Lipid panel:  No Known Allergies  Current Outpatient Medications  Medication Sig Dispense Refill  . abiraterone acetate (ZYTIGA) 250 MG tablet TAKE 4 TABLETS (1,000 MG TOTAL) BY MOUTH DAILY. TAKE ON AN EMPTY STOMACH 1 HOUR BEFORE OR 2 HOURS AFTER A MEAL. 120 tablet 3  . oxybutynin (DITROPAN) 5 MG  tablet Take 1 tablet (5 mg total) by mouth 2 (two) times daily. 60 tablet 0  . predniSONE (DELTASONE) 5 MG tablet TAKE 1 TABLET BY MOUTH ONCE DAILY WITH BREAKFAST 30 tablet 3   No current facility-administered medications for this visit.     OBJECTIVE: Vitals:   07/15/18 0929  BP: 129/85  Pulse: 82  Resp: 18  Temp: (!) 97.3 F (36.3 C)     Body mass index is 33.13 kg/m.    ECOG FS:0 - Asymptomatic   General: Well-developed, well-nourished, no acute distress. Eyes: Pink conjunctiva, anicteric sclera. HEENT: Normocephalic, moist mucous membranes.  Lungs: Clear to auscultation bilaterally. Heart: Regular rate and rhythm. No rubs, murmurs, or gallops. Abdomen: Soft, nontender, nondistended. No organomegaly noted, normoactive bowel sounds. Musculoskeletal: No edema, cyanosis, or clubbing. Neuro: Alert, answering all questions appropriately. Cranial nerves grossly intact. Skin: No rashes or petechiae noted. Psych: Normal affect.  LAB RESULTS:  Lab Results  Component Value Date   NA 139 07/06/2018   K 4.4 07/06/2018   CL 107 07/06/2018   CO2 27 07/06/2018   GLUCOSE 132 (H) 07/06/2018   BUN 14 07/06/2018   CREATININE 0.82 07/06/2018   CALCIUM 9.3 07/06/2018   PROT 7.8 07/06/2018   ALBUMIN 4.0 07/06/2018   AST 28 07/06/2018   ALT 19 07/06/2018   ALKPHOS 126 07/06/2018   BILITOT 1.3 (H) 07/06/2018   GFRNONAA >60 07/06/2018   GFRAA >60 07/06/2018    Lab Results  Component Value Date   WBC 7.2 07/06/2018   NEUTROABS 5.2 07/06/2018   HGB 15.1 07/06/2018   HCT 43.7 07/06/2018   MCV 101.2 (H) 07/06/2018   PLT 125 (L) 07/06/2018     STUDIES: No results found.  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 In 2017 upon diagnosis, he reportedly received XRT along with brachii therapy.  He last received Lupron in September 2018.  Patient states he had allergic reaction to Lupron and refuses to take any further injections.  He also was given a prescription for Casodex by urology, but has refused to take this as well.  He states he will never take any chemotherapy, but reluctantly agreed to initiate Zytiga plus prednisone.  Typically in castrate sensitive disease should be treated with both Zytiga and Lupron, but patient is refusing injections as above.  Patient is tolerating his treatments well without  significant side effects.  His PSA has significantly improved and is now 3.46.  Continue Zytiga and prednisone indefinitely.  Return to clinic in 6 weeks for laboratory work only and then in 3 months for laboratory work and further evaluation. 2.  Suprapubic catheter: Patient states he has home health to manage it.  He does not want to return to urology at this time.    I spent a total of 30 minutes face-to-face with the patient of which greater than 50% of the visit was spent in counseling and coordination of care as detailed above.    Patient expressed understanding and was in agreement with this plan. He also understands that He can call clinic at any time with any questions, concerns, or complaints.   Cancer Staging Prostate cancer [facility]) Staging form: Prostate, AJCC 8th Edition - Clinical stage from 03/04/2018: Stage IVB (cT3a, cN0, cM1b, Grade Group: 5) -  Signed by Lloyd Huger, MD on 03/04/2018   Lloyd Huger, MD   07/15/2018 12:52 PM

## 2018-07-14 ENCOUNTER — Other Ambulatory Visit: Payer: Self-pay

## 2018-07-15 ENCOUNTER — Encounter: Payer: Self-pay | Admitting: Oncology

## 2018-07-15 ENCOUNTER — Inpatient Hospital Stay (HOSPITAL_BASED_OUTPATIENT_CLINIC_OR_DEPARTMENT_OTHER): Payer: Medicare HMO | Admitting: Oncology

## 2018-07-15 ENCOUNTER — Other Ambulatory Visit: Payer: Self-pay

## 2018-07-15 VITALS — BP 129/85 | HR 82 | Temp 97.3°F | Resp 18 | Wt 230.9 lb

## 2018-07-15 DIAGNOSIS — Z800 Family history of malignant neoplasm of digestive organs: Secondary | ICD-10-CM | POA: Diagnosis not present

## 2018-07-15 DIAGNOSIS — C7951 Secondary malignant neoplasm of bone: Secondary | ICD-10-CM

## 2018-07-15 DIAGNOSIS — C61 Malignant neoplasm of prostate: Secondary | ICD-10-CM

## 2018-07-15 DIAGNOSIS — F17210 Nicotine dependence, cigarettes, uncomplicated: Secondary | ICD-10-CM | POA: Diagnosis not present

## 2018-07-15 NOTE — Progress Notes (Signed)
Patient denies any concerns today.  

## 2018-07-25 ENCOUNTER — Telehealth: Payer: Self-pay | Admitting: Urology

## 2018-07-25 DIAGNOSIS — R339 Retention of urine, unspecified: Secondary | ICD-10-CM | POA: Diagnosis not present

## 2018-07-25 DIAGNOSIS — C7951 Secondary malignant neoplasm of bone: Secondary | ICD-10-CM | POA: Diagnosis not present

## 2018-07-25 DIAGNOSIS — N390 Urinary tract infection, site not specified: Secondary | ICD-10-CM | POA: Diagnosis not present

## 2018-07-25 DIAGNOSIS — N401 Enlarged prostate with lower urinary tract symptoms: Secondary | ICD-10-CM | POA: Diagnosis not present

## 2018-07-25 DIAGNOSIS — Z435 Encounter for attention to cystostomy: Secondary | ICD-10-CM | POA: Diagnosis not present

## 2018-07-25 DIAGNOSIS — I10 Essential (primary) hypertension: Secondary | ICD-10-CM | POA: Diagnosis not present

## 2018-07-25 DIAGNOSIS — Z466 Encounter for fitting and adjustment of urinary device: Secondary | ICD-10-CM | POA: Diagnosis not present

## 2018-07-25 DIAGNOSIS — I878 Other specified disorders of veins: Secondary | ICD-10-CM | POA: Diagnosis not present

## 2018-07-25 DIAGNOSIS — C61 Malignant neoplasm of prostate: Secondary | ICD-10-CM | POA: Diagnosis not present

## 2018-07-25 NOTE — Telephone Encounter (Signed)
If he is not taking the oxybutynin TID, he can try that for the spasms.

## 2018-07-25 NOTE — Telephone Encounter (Signed)
Dr. Erlene Quan had recommended that he not plug his SPT and let it drain by gravity.  Has he been plugging the SPT?  I would also like to know if he has been having fever, chills, nausea or vomiting?  Where is the pain exactly?  How long has the urine been cloudy with the odor?  Also, we need to know if he is taking his Zytiga and prednisone?  Is home health able to obtain an urine for culture?

## 2018-07-25 NOTE — Telephone Encounter (Signed)
Jacob Moores from [facility] home [facility] called the office today to make [name] aware that when she went out to change his SPT today that the patient complained if pain, she stated that it was 8 out of 10 from the SPT tube itself. His urine was cloudy with odor. She would like a call back to discuss this. She can be reached at [phone]   Thanks, Sharyn Lull

## 2018-07-25 NOTE — Telephone Encounter (Signed)
I will call Ericka back. What do you suggest before I call?

## 2018-07-25 NOTE — Telephone Encounter (Signed)
Gerald Odonnell states he has not had any fever nausea or vomiting.  He complains of having bladder spasms, he states the Oxybutynin does not help at all for the spasms. He is taking his medications. Gerald Odonnell states she only saw a little blood when she took the catheter out and replaced it with the new one.  I informed her his urine may not always be clear because of the chronic catheter in place.

## 2018-07-26 DIAGNOSIS — N390 Urinary tract infection, site not specified: Secondary | ICD-10-CM | POA: Diagnosis not present

## 2018-07-26 DIAGNOSIS — R339 Retention of urine, unspecified: Secondary | ICD-10-CM | POA: Diagnosis not present

## 2018-07-26 DIAGNOSIS — I878 Other specified disorders of veins: Secondary | ICD-10-CM | POA: Diagnosis not present

## 2018-07-26 DIAGNOSIS — Z435 Encounter for attention to cystostomy: Secondary | ICD-10-CM | POA: Diagnosis not present

## 2018-07-26 DIAGNOSIS — Z466 Encounter for fitting and adjustment of urinary device: Secondary | ICD-10-CM | POA: Diagnosis not present

## 2018-07-26 DIAGNOSIS — C61 Malignant neoplasm of prostate: Secondary | ICD-10-CM | POA: Diagnosis not present

## 2018-07-26 DIAGNOSIS — C7951 Secondary malignant neoplasm of bone: Secondary | ICD-10-CM | POA: Diagnosis not present

## 2018-07-26 DIAGNOSIS — I10 Essential (primary) hypertension: Secondary | ICD-10-CM | POA: Diagnosis not present

## 2018-07-26 DIAGNOSIS — N401 Enlarged prostate with lower urinary tract symptoms: Secondary | ICD-10-CM | POA: Diagnosis not present

## 2018-07-26 NOTE — Telephone Encounter (Signed)
Called pt no answer. LM for pt informing him of the information below per [facility], advised pt to call back for questions or concerns.   Called caregiver Jacob Moores informed her of the information below as well.

## 2018-07-26 NOTE — Telephone Encounter (Signed)
Pt called back and states he is taking Oxybutynin and he is still having pain (stabbing pain at times) He would like a call back. Please advise.

## 2018-07-27 DIAGNOSIS — R339 Retention of urine, unspecified: Secondary | ICD-10-CM | POA: Diagnosis not present

## 2018-07-27 DIAGNOSIS — N390 Urinary tract infection, site not specified: Secondary | ICD-10-CM | POA: Diagnosis not present

## 2018-07-27 DIAGNOSIS — Z435 Encounter for attention to cystostomy: Secondary | ICD-10-CM | POA: Diagnosis not present

## 2018-07-27 DIAGNOSIS — C7951 Secondary malignant neoplasm of bone: Secondary | ICD-10-CM | POA: Diagnosis not present

## 2018-07-27 DIAGNOSIS — N401 Enlarged prostate with lower urinary tract symptoms: Secondary | ICD-10-CM | POA: Diagnosis not present

## 2018-07-27 DIAGNOSIS — C61 Malignant neoplasm of prostate: Secondary | ICD-10-CM | POA: Diagnosis not present

## 2018-07-27 DIAGNOSIS — I878 Other specified disorders of veins: Secondary | ICD-10-CM | POA: Diagnosis not present

## 2018-07-27 DIAGNOSIS — I10 Essential (primary) hypertension: Secondary | ICD-10-CM | POA: Diagnosis not present

## 2018-07-27 DIAGNOSIS — Z466 Encounter for fitting and adjustment of urinary device: Secondary | ICD-10-CM | POA: Diagnosis not present

## 2018-07-27 NOTE — Telephone Encounter (Signed)
Would recommend dropping off a urine sample to r/o UTI, however he has metastatic prostate cancer and that may be the cause of his pain.  Would recommend virtual or in person visit with [facility] in the next 1-2 weeks, as this patient has been followed extensively by Dr. Erlene Quan and Larene Beach.  Nickolas Madrid, MD 07/27/2018

## 2018-07-28 NOTE — Telephone Encounter (Signed)
Informed patient as directed. Patient declined office visit-he refused to drop off a urine sample per recommendations. He agreed to virtual office visit-scheduled with Larene Beach on 08/02/2018. Patient verbalized understanding.

## 2018-08-02 ENCOUNTER — Other Ambulatory Visit: Payer: Self-pay

## 2018-08-02 ENCOUNTER — Telehealth (INDEPENDENT_AMBULATORY_CARE_PROVIDER_SITE_OTHER): Payer: Medicare HMO | Admitting: Urology

## 2018-08-02 DIAGNOSIS — C61 Malignant neoplasm of prostate: Secondary | ICD-10-CM

## 2018-08-02 DIAGNOSIS — N319 Neuromuscular dysfunction of bladder, unspecified: Secondary | ICD-10-CM | POA: Diagnosis not present

## 2018-08-02 DIAGNOSIS — C7951 Secondary malignant neoplasm of bone: Secondary | ICD-10-CM

## 2018-08-02 NOTE — Progress Notes (Signed)
Virtual Visit via Video Note  I connected with Gerald Odonnell on 08/02/18 at 10:50 AM EDT by a telephone as patient refused video enabled telemedicine application and verified that I am speaking with the correct person using two identifiers.  This visit type was conducted due to national recommendations for restrictions regarding the COVID-19 Pandemic (e.g. social distancing).  This format is felt to be most appropriate for this patient at this time.  All issues noted in this document were discussed and addressed.  No physical exam was performed.   I discussed the limitations, risks, security and privacy concerns of performing an evaluation and management service by telephone and the availability of in person appointments. I also discussed with the patient that there may be a patient responsible charge related to this service. The patient expressed understanding and agreed to proceed.   History of Present Illness: Gerald Odonnell is a 63 year old male with metastatic prostate cancer with a neurogenic bladder managed with a SPT.  He states that he has been having pain in the tip of his penis for the last 6 years since his SPT was placed.  He has limited understanding of his situation as he continues to refer to his cancer as bone cancer, not prostate cancer.  He is also convinced that he will be able to urinate normally if he could have some pain medicine to help him get his sphincter muscles back in shape.  He states currently that the urine in the tubing is crystal clear.  He is asymptomatic.  Patient denies any gross hematuria or suprapubic/flank pain.  Patient denies any fevers, chills, nausea or vomiting.     He states that the pain is worse a few days before it is time to change the SPT.  He states taking the oxybutynin helps for a few hours, but then the pain returns.  He is taking 10 mg of oxybutynin 3 times daily.  He is also tried on Myrbetriq, but he states that medication did not provide relief.     He states he is allowing the SPT to drain to gravity and he is no longer plugging the catheter.  He is taking the Zytiga and prednisone as prescribed by Dr. Grayland Ormond.  He does have an upcoming appointment on Sep 21, 2018 with Dr. Erlene Quan and he is adamant about not having another cystoscopy in the office.  He states he is having his SPT changed on a monthly basis through home health.  He is quite pleased with his service.    His most recent PSA was 3.46 on July 06, 2018.     Observations/Objective: Difficult historian as he was hard to keep on task when asked questions and would not accept explanations for his current medical conditions.  Continued to bring up the possibility of having narcotics prescribed.  Assessment and Plan:  1. Neurogenic bladder No symptoms of an UTI at this time  Continue monthly SPT exchanges with home health Keep appointment with Dr. Erlene Quan on May 27th, 2020 -encourage the patient to undergo the cystoscopy as we need to evaluate his bladder for possible cancer that can develop with an indwelling catheter, but patient is very resistant  2. Prostate cancer metastatic to bone Personal history of high risk prostate cancer status post IMRT with brachytherapy boost + ADT x 2 years With progression to bony metastatic diease, stage IVb prostate cancer (Gleason's 5+4) metastatic to bone Managed by Dr. Grayland Ormond - appointments scheduled for 08/31/2000 for labs and  office visit 10/20/2018  Follow Up Instructions:    Gerald Odonnell is to follow up on 09/21/2018 with Dr. Erlene Quan for possible cystoscopy and office visit.  I discussed the assessment and treatment plan with the patient. The patient was provided an opportunity to ask questions and all were answered. The patient agreed with the plan and demonstrated an understanding of the instructions.   The patient was advised to call back or seek an in-person evaluation if the symptoms worsen or if the condition fails to  improve as anticipated.  I provided 36 minutes of non-face-to-face time during this encounter.   Girard Koontz, PA-C

## 2018-08-03 DIAGNOSIS — N319 Neuromuscular dysfunction of bladder, unspecified: Secondary | ICD-10-CM | POA: Diagnosis not present

## 2018-08-09 MED FILL — predniSONE 5 MG TABS: 5 | 30 days supply | Qty: 30 | Fill #1

## 2018-08-09 MED FILL — ABIRATERONE ACETATE 250 MG: 250 | 30 days supply | Qty: 120 | Fill #1

## 2018-08-24 ENCOUNTER — Telehealth: Payer: Self-pay

## 2018-08-24 DIAGNOSIS — N3289 Other specified disorders of bladder: Secondary | ICD-10-CM

## 2018-08-24 MED ORDER — OXYBUTYNIN CHLORIDE 5 MG PO TABS: 5.0000 mg | ORAL_TABLET | Freq: Four times a day (QID) | ORAL | 6 refills | Status: DC

## 2018-08-24 NOTE — Addendum Note (Signed)
Addended by: Donalee Citrin on: 08/24/2018 04:51 PM   Modules accepted: Orders

## 2018-08-24 NOTE — Telephone Encounter (Signed)
He should only be taking the 5 mg IR oxybutynin 4 times a day.  If he wants to take it 6 times daily we will need to switch it to the oxybutynin XL 15 mg twice a day.  For some reason, you cannot take the IR more than four times daily.

## 2018-08-24 NOTE — Telephone Encounter (Signed)
Can you please give clarification on how many times a day this pt should be taking oxybutynin? The pharmacy called for clarification as the last RX from Feb 2020 says twice daily but the patient is stating he has been taking a total of 6 tablets daily.

## 2018-08-24 NOTE — Telephone Encounter (Signed)
New Rx sent in for the corrected dose of QID.

## 2018-08-30 DIAGNOSIS — Z466 Encounter for fitting and adjustment of urinary device: Secondary | ICD-10-CM | POA: Diagnosis not present

## 2018-08-30 DIAGNOSIS — C7951 Secondary malignant neoplasm of bone: Secondary | ICD-10-CM | POA: Diagnosis not present

## 2018-08-30 DIAGNOSIS — N401 Enlarged prostate with lower urinary tract symptoms: Secondary | ICD-10-CM | POA: Diagnosis not present

## 2018-08-30 DIAGNOSIS — I10 Essential (primary) hypertension: Secondary | ICD-10-CM | POA: Diagnosis not present

## 2018-08-30 DIAGNOSIS — Z435 Encounter for attention to cystostomy: Secondary | ICD-10-CM | POA: Diagnosis not present

## 2018-08-30 DIAGNOSIS — C61 Malignant neoplasm of prostate: Secondary | ICD-10-CM | POA: Diagnosis not present

## 2018-08-30 DIAGNOSIS — R339 Retention of urine, unspecified: Secondary | ICD-10-CM | POA: Diagnosis not present

## 2018-08-31 ENCOUNTER — Encounter: Payer: Self-pay | Admitting: Intensive Care

## 2018-08-31 ENCOUNTER — Emergency Department: Payer: Medicare HMO

## 2018-08-31 ENCOUNTER — Emergency Department
Admission: EM | Admit: 2018-08-31 | Discharge: 2018-08-31 | Disposition: A | Discharge status: Home / Self Care | Payer: Medicare HMO | Attending: Emergency Medicine | Admitting: Emergency Medicine

## 2018-08-31 ENCOUNTER — Other Ambulatory Visit: Payer: Self-pay

## 2018-08-31 DIAGNOSIS — D3502 Benign neoplasm of left adrenal gland: Secondary | ICD-10-CM | POA: Diagnosis not present

## 2018-08-31 DIAGNOSIS — Z85038 Personal history of other malignant neoplasm of large intestine: Secondary | ICD-10-CM | POA: Diagnosis not present

## 2018-08-31 DIAGNOSIS — F17210 Nicotine dependence, cigarettes, uncomplicated: Secondary | ICD-10-CM | POA: Diagnosis not present

## 2018-08-31 DIAGNOSIS — W01190A Fall on same level from slipping, tripping and stumbling with subsequent striking against furniture, initial encounter: Secondary | ICD-10-CM | POA: Insufficient documentation

## 2018-08-31 DIAGNOSIS — R1084 Generalized abdominal pain: Secondary | ICD-10-CM | POA: Diagnosis not present

## 2018-08-31 DIAGNOSIS — Y999 Unspecified external cause status: Secondary | ICD-10-CM | POA: Insufficient documentation

## 2018-08-31 DIAGNOSIS — S20211A Contusion of right front wall of thorax, initial encounter: Secondary | ICD-10-CM

## 2018-08-31 DIAGNOSIS — I10 Essential (primary) hypertension: Secondary | ICD-10-CM | POA: Diagnosis not present

## 2018-08-31 DIAGNOSIS — Y929 Unspecified place or not applicable: Secondary | ICD-10-CM | POA: Diagnosis not present

## 2018-08-31 DIAGNOSIS — C7951 Secondary malignant neoplasm of bone: Secondary | ICD-10-CM | POA: Diagnosis not present

## 2018-08-31 DIAGNOSIS — Z435 Encounter for attention to cystostomy: Secondary | ICD-10-CM | POA: Diagnosis not present

## 2018-08-31 DIAGNOSIS — K7460 Unspecified cirrhosis of liver: Secondary | ICD-10-CM | POA: Diagnosis not present

## 2018-08-31 DIAGNOSIS — Y939 Activity, unspecified: Secondary | ICD-10-CM | POA: Diagnosis not present

## 2018-08-31 DIAGNOSIS — K5730 Diverticulosis of large intestine without perforation or abscess without bleeding: Secondary | ICD-10-CM | POA: Diagnosis not present

## 2018-08-31 DIAGNOSIS — Z8546 Personal history of malignant neoplasm of prostate: Secondary | ICD-10-CM | POA: Insufficient documentation

## 2018-08-31 DIAGNOSIS — W19XXXA Unspecified fall, initial encounter: Secondary | ICD-10-CM

## 2018-08-31 DIAGNOSIS — R339 Retention of urine, unspecified: Secondary | ICD-10-CM | POA: Diagnosis not present

## 2018-08-31 DIAGNOSIS — Z466 Encounter for fitting and adjustment of urinary device: Secondary | ICD-10-CM | POA: Diagnosis not present

## 2018-08-31 DIAGNOSIS — C61 Malignant neoplasm of prostate: Secondary | ICD-10-CM | POA: Diagnosis not present

## 2018-08-31 DIAGNOSIS — S29001A Unspecified injury of muscle and tendon of front wall of thorax, initial encounter: Secondary | ICD-10-CM | POA: Diagnosis present

## 2018-08-31 DIAGNOSIS — N401 Enlarged prostate with lower urinary tract symptoms: Secondary | ICD-10-CM | POA: Diagnosis not present

## 2018-08-31 LAB — COMPREHENSIVE METABOLIC PANEL
ALT: 22 U/L (ref 0–44)
AST: 36 U/L (ref 15–41)
Albumin: 3.8 g/dL (ref 3.5–5.0)
Alkaline Phosphatase: 101 U/L (ref 38–126)
Anion gap: 10 (ref 5–15)
BUN: 15 mg/dL (ref 8–23)
CO2: 23 mmol/L (ref 22–32)
Calcium: 8.9 mg/dL (ref 8.9–10.3)
Chloride: 111 mmol/L (ref 98–111)
Creatinine, Ser: 0.53 mg/dL — ABNORMAL LOW (ref 0.61–1.24)
GFR calc Af Amer: >60 mL/min (ref >60)
GFR calc non Af Amer: >60 mL/min (ref >60)
Glucose, Bld: 106 mg/dL — ABNORMAL HIGH (ref 70–99)
Potassium: 3.9 mmol/L (ref 3.5–5.1)
Sodium: 144 mmol/L (ref 135–145)
Total Bilirubin: 0.8 mg/dL (ref 0.3–1.2)
Total Protein: 6.9 g/dL (ref 6.5–8.1)

## 2018-08-31 LAB — LIPASE, BLOOD: Lipase: 24 U/L (ref 11–51)

## 2018-08-31 LAB — CBC
HCT: 44 % (ref 39.0–52.0)
Hemoglobin: 15.5 g/dL (ref 13.0–17.0)
MCH: 35.9 pg — ABNORMAL HIGH (ref 26.0–34.0)
MCHC: 35.2 g/dL (ref 30.0–36.0)
MCV: 101.9 fL — ABNORMAL HIGH (ref 80.0–100.0)
Platelets: 134 10*3/uL — ABNORMAL LOW (ref 150–400)
RBC: 4.32 MIL/uL (ref 4.22–5.81)
RDW: 14 % (ref 11.5–15.5)
WBC: 10 10*3/uL (ref 4.0–10.5)
nRBC: 0 % (ref 0.0–0.2)

## 2018-08-31 MED ORDER — IOHEXOL 300 MG/ML  SOLN
100.0000 mL | Freq: Once | INTRAMUSCULAR | Status: AC | PRN
  Administered 2018-08-31: 17:00:00 100 mL via INTRAVENOUS

## 2018-08-31 MED ORDER — ONDANSETRON HCL 4 MG/2ML IJ SOLN
4.0000 mg | Freq: Once | INTRAMUSCULAR | Status: AC
  Administered 2018-08-31: 4 mg via INTRAVENOUS
  Filled 2018-08-31: qty 2

## 2018-08-31 MED ORDER — MORPHINE SULFATE (PF) 4 MG/ML IV SOLN
4.0000 mg | Freq: Once | INTRAVENOUS | Status: AC
  Administered 2018-08-31: 4 mg via INTRAVENOUS
  Filled 2018-08-31: qty 1

## 2018-08-31 NOTE — ED Notes (Signed)
Patient transported to CT 

## 2018-08-31 NOTE — ED Triage Notes (Signed)
Patient reports having fall this morning resulting in pain in abdomen and Right rib cage. Patient also has wound on right leg since Christmas that he reports is not healing. Quarter size open wound noted. No drainage. Patient has foley catheter in place and denies any problems with it

## 2018-08-31 NOTE — ED Provider Notes (Signed)
[facility] Emergency Department Provider Note   ____________________________________________    I have reviewed the triage vital signs and the nursing notes.   HISTORY  Chief Complaint Fall     HPI Gerald Odonnell is a 63 y.o. male who presents after a fall.  Patient reports his right leg "gave out "which it "sometimes does "and he fell striking his right chest on a coffee table.  This this happened several hours prior to arrival.  His home health nurse told him he needed to come get checked out.  He does also describe some generalized abdominal pain.  He says this started soon after the fall.  He denies difficulty breathing.  Has not take anything for this.  No radiation of pain.  No other injuries reported.  No head injury no neck pain no back pain.  He does have chronic right leg pain which is unchanged  Past Medical History:  Diagnosis Date  . Cancer ([place])    colon  . Colon cancer ([place])    Hx of  . History of kidney stones   . Hypertension   . Prostate cancer ([place])   . Prostate cancer ([place])    Hx of   . S/P colon resection   . Shortness of breath dyspnea   . Suprapubic catheter ([place])   . Urinary retention     Patient Active Problem List   Diagnosis Date Noted  . Benign hypertension 08/22/2017  . Goals of care, counseling/discussion 08/22/2017  . Dysuria 08/22/2017  . Urinary retention 03/15/2017  . Prostate cancer ([place]) 03/15/2017  . Abscess 01/24/2016  . Sepsis ([place]) 09/10/2015  . Chest pain 09/10/2015  . 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 Clostridium difficile diarrhea   . Colitis, infectious 04/23/2015  . Incomplete bladder emptying 09/10/2014  . Edema leg 09/15/2013  . Pelvic and perineal pain 09/15/2013  . Lower extremity venous stasis 09/15/2013  . Pelvic pain in male 09/15/2013  . Benign prostatic hyperplasia with urinary obstruction 07/22/2013    Past Surgical History:  Procedure Laterality Date  . ABDOMINAL SURGERY     . COLON SURGERY    . CYSTOSCOPY WITH LITHOLAPAXY N/A 06/09/2017   Procedure: CYSTOSCOPY WITH LITHOLAPAXY;  Surgeon: Hollice Espy, MD;  Location: [facility] ORS;  Service: Urology;  Laterality: N/A;  . PROSTATE SURGERY  05/25/2016  . SUPRAPUBIC CATHETER INSERTION      Prior to Admission medications   Medication Sig Start Date End Date Taking? Authorizing Provider  abiraterone acetate (ZYTIGA) 250 MG tablet TAKE 4 TABLETS (1,000 MG TOTAL) BY MOUTH DAILY. TAKE ON AN EMPTY STOMACH 1 HOUR BEFORE OR 2 HOURS AFTER A MEAL. 07/01/18   Lloyd Huger, MD  oxybutynin (DITROPAN) 5 MG tablet Take 1 tablet (5 mg total) by mouth 4 (four) times daily. 08/24/18   Mc[place]an, Larene Beach A, PA-C  predniSONE (DELTASONE) 5 MG tablet TAKE 1 TABLET BY MOUTH ONCE DAILY WITH BREAKFAST Patient taking differently: Take 5 mg by mouth daily.  07/01/18   Lloyd Huger, MD     Allergies Patient has no known allergies.  Family History  Problem Relation Age of Onset  . Hypertension Other   . Alcohol abuse Mother   . Heart attack Father   . Prostate cancer Brother   . Breast cancer Sister     Social History Social History   Tobacco Use  . Smoking status: Current Every Day Smoker    Packs/day: 0.25    Years: 40.00  Pack years: 10.00    Types: Cigarettes  . Smokeless tobacco: Never Used  . Tobacco comment: 1pack a week  Substance Use Topics  . Alcohol use: Yes    Alcohol/week: 60.0 standard drinks    Types: 60 Cans of beer per week    Comment: Reports drinking a 12 pack a day if available  . Drug use: No    Review of Systems  Constitutional: No fever/chills Eyes: No visual changes.  ENT: No neck pain Cardiovascular: Chest wall pain Respiratory: Denies shortness of breath. Gastrointestinal: Diffuse abdominal cramping, no bloating Genitourinary: Suprapubic catheter Musculoskeletal: As above Skin: Negative for rash. Neurological: Negative for headaches     ____________________________________________   PHYSICAL EXAM:  VITAL SIGNS: ED Triage Vitals  Enc Vitals Group     BP 08/31/18 1407 123/76     Pulse Rate 08/31/18 1407 96     Resp 08/31/18 1407 18     Temp 08/31/18 1407 98.4 F (36.9 C)     Temp Source 08/31/18 1407 Oral     SpO2 08/31/18 1407 95 %     Weight 08/31/18 1408 106.6 kg (235 lb)     Height 08/31/18 1408 1.803 m (5\' 11" )     Head Circumference --      Peak Flow --      Pain Score 08/31/18 1422 10     Pain Loc --      Pain Edu? --      Excl. in [place]? --     Constitutional: Alert and oriented.  Eyes: Conjunctivae are normal.   Nose: No congestion/rhinnorhea. Mouth/Throat: Mucous membranes are moist.    Cardiovascular: Normal rate, regular rhythm. Grossly normal heart sounds.  Good peripheral circulation.  Significant bruising and swelling to the right lateral chest at approximately the fifth rib, no bony normalities palpated Respiratory: Normal respiratory effort.  No retractions. Lungs CTAB. Gastrointestinal: Mild diffuse tenderness to palpation, no bruising no distention, suprapubic catheter noted Genitourinary: deferred Musculoskeletal: Back: No vertebral tenderness palpation.  Lower extremities normal range of motion without pain.  Upper extremities normal range of motion without pain.,  Clavicles nontender warm and well perfused Neurologic:  Normal speech and language. No gross focal neurologic deficits are appreciated.  Skin:  Skin is warm, dry and intact. No rash noted. Psychiatric: Mood and affect are normal. Speech and behavior are normal.  ____________________________________________   LABS (all labs ordered are listed, but only abnormal results are displayed)  Labs Reviewed  COMPREHENSIVE METABOLIC PANEL - Abnormal; Notable for the following components:      Result Value   Glucose, Bld 106 (*)    Creatinine, Ser 0.53 (*)    All other components within normal limits  CBC - Abnormal; Notable for the  following components:   MCV 101.9 (*)    MCH 35.9 (*)    Platelets 134 (*)    All other components within normal limits  LIPASE, BLOOD   ____________________________________________  EKG  None ____________________________________________  RADIOLOGY  CT chest with contrast CT abdomen pelvis ____________________________________________   PROCEDURES  Procedure(s) performed: No  Procedures   Critical Care performed: No ____________________________________________   INITIAL IMPRESSION / ASSESSMENT AND PLAN / ED COURSE  Pertinent labs & imaging results that were available during my care of the patient were reviewed by me and considered in my medical decision making (see chart for details).  Patient presents after fall with bruising and swelling to the right lateral chest suspicious for rib fractures.  Also  with diffuse abdominal discomfort given recent blunt trauma will send for CT abdomen pelvis, treat with IV morphine, IV Zofran and reevaluate.  Lab work is reassuring.  CT scan without rib fracture or significant traumatic injury.  Patient is feeling well, appropriate for discharge at this time    ____________________________________________   FINAL CLINICAL IMPRESSION(S) / ED DIAGNOSES  Final diagnoses:  Chest wall contusion, right, initial encounter  Fall, initial encounter        Note:  This document was prepared using Dragon voice recognition software and may include unintentional dictation errors.   Lavonia Drafts, MD 08/31/18 [phone]

## 2018-09-01 ENCOUNTER — Inpatient Hospital Stay: Payer: Medicare HMO

## 2018-09-03 MED FILL — predniSONE 5 MG TABS: 5 | 30 days supply | Qty: 30 | Fill #2

## 2018-09-03 MED FILL — ABIRATERONE ACETATE 250 MG: 250 | 30 days supply | Qty: 120 | Fill #2

## 2018-09-07 ENCOUNTER — Ambulatory Visit (INDEPENDENT_AMBULATORY_CARE_PROVIDER_SITE_OTHER): Payer: Medicare HMO | Admitting: Adult Health

## 2018-09-07 ENCOUNTER — Other Ambulatory Visit: Payer: Self-pay

## 2018-09-07 ENCOUNTER — Encounter: Payer: Self-pay | Admitting: Adult Health

## 2018-09-07 VITALS — BP 132/89 | HR 88 | Resp 16 | Ht 70.0 in | Wt 233.0 lb

## 2018-09-07 DIAGNOSIS — I10 Essential (primary) hypertension: Secondary | ICD-10-CM

## 2018-09-07 DIAGNOSIS — S80811D Abrasion, right lower leg, subsequent encounter: Secondary | ICD-10-CM

## 2018-09-07 DIAGNOSIS — F17200 Nicotine dependence, unspecified, uncomplicated: Secondary | ICD-10-CM
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
## 2018-09-08 ENCOUNTER — Inpatient Hospital Stay: Payer: Medicare HMO | Attending: Oncology

## 2018-09-08 ENCOUNTER — Other Ambulatory Visit: Payer: Self-pay

## 2018-09-08 DIAGNOSIS — C61 Malignant neoplasm of prostate: Secondary | ICD-10-CM

## 2018-09-08 LAB — CBC WITH DIFFERENTIAL/PLATELET
Abs Immature Granulocytes: 0.04 10*3/uL (ref 0.00–0.07)
Basophils Absolute: 0.1 10*3/uL (ref 0.0–0.1)
Basophils Relative: 1 %
Eosinophils Absolute: 0.1 10*3/uL (ref 0.0–0.5)
Eosinophils Relative: 1 %
HCT: 42.8 % (ref 39.0–52.0)
Hemoglobin: 14.6 g/dL (ref 13.0–17.0)
Immature Granulocytes: 1 %
Lymphocytes Relative: 16 %
Lymphs Abs: 1.2 10*3/uL (ref 0.7–4.0)
MCH: 36 pg — ABNORMAL HIGH (ref 26.0–34.0)
MCHC: 34.1 g/dL (ref 30.0–36.0)
MCV: 105.7 fL — ABNORMAL HIGH (ref 80.0–100.0)
Monocytes Absolute: 0.7 10*3/uL (ref 0.1–1.0)
Monocytes Relative: 10 %
Neutro Abs: 5.6 10*3/uL (ref 1.7–7.7)
Neutrophils Relative %: 71 %
Platelets: 99 10*3/uL — ABNORMAL LOW (ref 150–400)
RBC: 4.05 MIL/uL — ABNORMAL LOW (ref 4.22–5.81)
RDW: 14.1 % (ref 11.5–15.5)
WBC: 7.7 10*3/uL (ref 4.0–10.5)
nRBC: 0 % (ref 0.0–0.2)

## 2018-09-08 LAB — COMPREHENSIVE METABOLIC PANEL
ALT: 28 U/L (ref 0–44)
AST: 45 U/L — ABNORMAL HIGH (ref 15–41)
Albumin: 3.9 g/dL (ref 3.5–5.0)
Alkaline Phosphatase: 98 U/L (ref 38–126)
Anion gap: 9 (ref 5–15)
BUN: 16 mg/dL (ref 8–23)
CO2: 25 mmol/L (ref 22–32)
Calcium: 9.5 mg/dL (ref 8.9–10.3)
Chloride: 107 mmol/L (ref 98–111)
Creatinine, Ser: 0.69 mg/dL (ref 0.61–1.24)
GFR calc Af Amer: >60 mL/min (ref >60)
GFR calc non Af Amer: >60 mL/min (ref >60)
Glucose, Bld: 98 mg/dL (ref 70–99)
Potassium: 4.1 mmol/L (ref 3.5–5.1)
Sodium: 141 mmol/L (ref 135–145)
Total Bilirubin: 0.8 mg/dL (ref 0.3–1.2)
Total Protein: 7.6 g/dL (ref 6.5–8.1)

## 2018-09-08 LAB — PSA: Prostatic Specific Antigen: 3.23 ng/mL (ref 0.00–4.00)

## 2018-09-08 LAB — MAGNESIUM: Magnesium: 2.4 mg/dL (ref 1.7–2.4)

## 2018-09-08 LAB — PHOSPHORUS: Phosphorus: 3.8 mg/dL (ref 2.5–4.6)

## 2018-09-09 ENCOUNTER — Telehealth: Payer: Self-pay | Admitting: *Deleted

## 2018-09-09 NOTE — Telephone Encounter (Signed)
Patient called asking for PSA results  )   Ref Range & Units 1d ago 81mo ago 70mo ago 67mo ago  Prostatic Specific Antigen 0.00 - 4.00 ng/mL 3.23  3.46 CM 7.32High  CM 36.95High  CM       Patient informed

## 2018-09-18 DIAGNOSIS — R339 Retention of urine, unspecified: Secondary | ICD-10-CM | POA: Diagnosis not present

## 2018-09-18 DIAGNOSIS — Z435 Encounter for attention to cystostomy: Secondary | ICD-10-CM | POA: Diagnosis not present

## 2018-09-18 DIAGNOSIS — N401 Enlarged prostate with lower urinary tract symptoms: Secondary | ICD-10-CM | POA: Diagnosis not present

## 2018-09-18 DIAGNOSIS — C7951 Secondary malignant neoplasm of bone: Secondary | ICD-10-CM | POA: Diagnosis not present

## 2018-09-18 DIAGNOSIS — C61 Malignant neoplasm of prostate: Secondary | ICD-10-CM | POA: Diagnosis not present

## 2018-09-18 DIAGNOSIS — Z466 Encounter for fitting and adjustment of urinary device: Secondary | ICD-10-CM | POA: Diagnosis not present

## 2018-09-18 DIAGNOSIS — I10 Essential (primary) hypertension: Secondary | ICD-10-CM | POA: Diagnosis not present

## 2018-09-20 ENCOUNTER — Telehealth: Payer: Self-pay | Admitting: Pharmacy Technician

## 2018-09-20 NOTE — Telephone Encounter (Signed)
Oral Oncology Patient Advocate Encounter  Prior Authorization for Fabio Asa has been approved.    PA# [id number] Effective dates: 09/20/2018 through 03/19/2019  Oral Oncology Clinic will continue to follow.   [place] Patient [place] Phone [phone] Fax [phone] 09/20/2018 11:44 AM

## 2018-09-20 NOTE — Telephone Encounter (Signed)
Oral Oncology Patient Advocate Encounter  Received notification from [facility] that prior authorization for Gerald Odonnell is required.  PA submitted on CoverMyMeds Key [id number]  Status is pending  Oral Oncology Clinic will continue to follow.  [place] Patient [place] Phone [phone] Fax [phone] 09/20/2018 9:09 AM

## 2018-09-21 ENCOUNTER — Other Ambulatory Visit: Payer: Medicare HMO | Admitting: Urology

## 2018-10-05 ENCOUNTER — Other Ambulatory Visit: Payer: Self-pay

## 2018-10-05 ENCOUNTER — Ambulatory Visit: Payer: Medicare HMO | Admitting: Adult Health

## 2018-10-05 MED FILL — ABIRATERONE ACETATE 250 MG: 250 | 30 days supply | Qty: 120 | Fill #3

## 2018-10-05 MED FILL — predniSONE 5 MG TABS: 5 | 30 days supply | Qty: 30 | Fill #3

## 2018-10-13 ENCOUNTER — Other Ambulatory Visit: Payer: Self-pay

## 2018-10-13 ENCOUNTER — Ambulatory Visit (INDEPENDENT_AMBULATORY_CARE_PROVIDER_SITE_OTHER): Payer: Medicare HMO | Admitting: Adult Health

## 2018-10-13 ENCOUNTER — Encounter: Payer: Self-pay | Admitting: Adult Health

## 2018-10-13 VITALS — BP 140/86 | HR 97 | Resp 16 | Ht 70.0 in | Wt 230.0 lb

## 2018-10-13 DIAGNOSIS — R609 Edema, unspecified: Secondary | ICD-10-CM | POA: Diagnosis not present

## 2018-10-13 DIAGNOSIS — C61 Malignant neoplasm of prostate: Secondary | ICD-10-CM

## 2018-10-13 DIAGNOSIS — I10 Essential (primary) hypertension: Secondary | ICD-10-CM

## 2018-10-13 DIAGNOSIS — F17200 Nicotine dependence, unspecified, uncomplicated: Secondary | ICD-10-CM | POA: Diagnosis not present

## 2018-10-13 DIAGNOSIS — S80811D Abrasion, right lower leg, subsequent encounter: Secondary | ICD-10-CM

## 2018-10-13 MED ORDER — FUROSEMIDE 20 MG PO TABS: 20.0000 mg | ORAL_TABLET | Freq: Every day | ORAL | 0 refills | Status: DC

## 2018-10-13 NOTE — Patient Instructions (Signed)
Wound Care, Adult  Taking care of your wound properly can help to prevent pain, infection, and scarring. It can also help your wound to heal more quickly.  How to care for your wound  Wound care          Follow instructions from your health care provider about how to take care of your wound. Make sure you:  ? Wash your hands with soap and water before you change the bandage (dressing). If soap and water are not available, use hand sanitizer.  ? Change your dressing as told by your health care provider.  ? Leave stitches (sutures), skin glue, or adhesive strips in place. These skin closures may need to stay in place for 2 weeks or longer. If adhesive strip edges start to loosen and curl up, you may trim the loose edges. Do not remove adhesive strips completely unless your health care provider tells you to do that.   Check your wound area every day for signs of infection. Check for:  ? Redness, swelling, or pain.  ? Fluid or blood.  ? Warmth.  ? Pus or a bad smell.   Ask your health care provider if you should clean the wound with mild soap and water. Doing this may include:  ? Using a clean towel to pat the wound dry after cleaning it. Do not rub or scrub the wound.  ? Applying a cream or ointment. Do this only as told by your health care provider.  ? Covering the incision with a clean dressing.   Ask your health care provider when you can leave the wound uncovered.   Keep the dressing dry until your health care provider says it can be removed. Do not take baths, swim, use a hot tub, or do anything that would put the wound underwater until your health care provider approves. Ask your health care provider if you can take showers. You may only be allowed to take sponge baths.  Medicines     If you were prescribed an antibiotic medicine, cream, or ointment, take or use the antibiotic as told by your health care provider. Do not stop taking or using the antibiotic even if your condition improves.   Take  over-the-counter and prescription medicines only as told by your health care provider. If you were prescribed pain medicine, take it 30 or more minutes before you do any wound care or as told by your health care provider.  General instructions   Return to your normal activities as told by your health care provider. Ask your health care provider what activities are safe.   Do not scratch or pick at the wound.   Do not use any products that contain nicotine or tobacco, such as cigarettes and e-cigarettes. These may delay wound healing. If you need help quitting, ask your health care provider.   Keep all follow-up visits as told by your health care provider. This is important.   Eat a diet that includes protein, vitamin A, vitamin C, and other nutrient-rich foods to help the wound heal.  ? Foods rich in protein include meat, dairy, beans, nuts, and other sources.  ? Foods rich in vitamin A include carrots and dark green, leafy vegetables.  ? Foods rich in vitamin C include citrus, tomatoes, and other fruits and vegetables.  ? Nutrient-rich foods have protein, carbohydrates, fat, vitamins, or minerals. Eat a variety of healthy foods including vegetables, fruits, and whole grains.  Contact a health care provider if:     You received a tetanus shot and you have swelling, severe pain, redness, or bleeding at the injection site.   Your pain is not controlled with medicine.   You have redness, swelling, or pain around the wound.   You have fluid or blood coming from the wound.   Your wound feels warm to the touch.   You have pus or a bad smell coming from the wound.   You have a fever or chills.   You are nauseous or you vomit.   You are dizzy.  Get help right away if:   You have a red streak going away from your wound.   The edges of the wound open up and separate.   Your wound is bleeding, and the bleeding does not stop with gentle pressure.   You have a rash.   You faint.   You have trouble  breathing.  Summary   Always wash your hands with soap and water before changing your bandage (dressing).   To help with healing, eat foods that are rich in protein, vitamin A, vitamin C, and other nutrients.   Check your wound every day for signs of infection. Contact your health care provider if you suspect that your wound is infected.  This information is not intended to replace advice given to you by your health care provider. Make sure you discuss any questions you have with your health care provider.  Document Released: 01/21/2008 Document Revised: 05/25/2017 Document Reviewed: 10/29/2015  Elsevier Interactive Patient Education  2019 [employer]

## 2018-10-13 NOTE — Progress Notes (Signed)
[facility] [place], [place] [address]  Internal MEDICINE  Office Visit Note  Patient Name: Gerald Odonnell  [id number]  [id number]  Date of Service: 10/13/2018  Chief Complaint  Patient presents with  . Medical Management of Chronic Issues    4 week follow up leg abrasion , it is healing up     HPI Pt is here for follow up on leg abrasion.  He reports home health has been helping him dress his wound.  He reports it is getting better.  The wound is not draining, and appears granulated.  He does continue to have some edema in bilateral feet. He denies any sob, or issues.     Current Medication: Outpatient Encounter Medications as of 10/13/2018  Medication Sig  . abiraterone acetate (ZYTIGA) 250 MG tablet TAKE 4 TABLETS (1,000 MG TOTAL) BY MOUTH DAILY. TAKE ON AN EMPTY STOMACH 1 HOUR BEFORE OR 2 HOURS AFTER A MEAL.  Marland Kitchen oxybutynin (DITROPAN) 5 MG tablet Take 1 tablet (5 mg total) by mouth 4 (four) times daily.  . predniSONE (DELTASONE) 5 MG tablet TAKE 1 TABLET BY MOUTH ONCE DAILY WITH BREAKFAST (Patient taking differently: Take 5 mg by mouth daily. )  . furosemide (LASIX) 20 MG tablet Take 1 tablet (20 mg total) by mouth daily.   No facility-administered encounter medications on file as of 10/13/2018.     Surgical History: Past Surgical History:  Procedure Laterality Date  . ABDOMINAL SURGERY    . COLON SURGERY    . CYSTOSCOPY WITH LITHOLAPAXY N/A 06/09/2017   Procedure: CYSTOSCOPY WITH LITHOLAPAXY;  Surgeon: Hollice Espy, MD;  Location: [facility] ORS;  Service: Urology;  Laterality: N/A;  . PROSTATE SURGERY  05/25/2016  . SUPRAPUBIC CATHETER INSERTION      Medical History: Past Medical History:  Diagnosis Date  . Cancer (Grandfather)    colon  . Colon cancer ([place])    Hx of  . History of kidney stones   . Hypertension   . Prostate cancer ([place])   . Prostate cancer ([place])    Hx of   . S/P colon resection   . Shortness of breath dyspnea   . Suprapubic  catheter ([place])   . Urinary retention     Family History: Family History  Problem Relation Age of Onset  . Hypertension Other   . Alcohol abuse Mother   . Heart attack Father   . Prostate cancer Brother   . Breast cancer Sister     Social History   Socioeconomic History  . Marital status: Widowed    Spouse name: Not on file  . Number of children: Not on file  . Years of education: Not on file  . Highest education level: Not on file  Occupational History  . Not on file  Social Needs  . Financial resource strain: Not on file  . Food insecurity    Worry: Not on file    Inability: Not on file  . Transportation needs    Medical: Not on file    Non-medical: Not on file  Tobacco Use  . Smoking status: Current Every Day Smoker    Packs/day: 0.25    Years: 40.00    Pack years: 10.00    Types: Cigarettes  . Smokeless tobacco: Never Used  . Tobacco comment: 1pack a week  Substance and Sexual Activity  . Alcohol use: Yes    Alcohol/week: 60.0 standard drinks    Types: 60 Cans of beer per week  Comment: Reports drinking a 12 pack a day if available  . Drug use: No  . Sexual activity: Not Currently  Lifestyle  . Physical activity    Days per week: Not on file    Minutes per session: Not on file  . Stress: Not on file  Relationships  . Social Herbalist on phone: Not on file    Gets together: Not on file    Attends religious service: Not on file    Active member of club or organization: Not on file    Attends meetings of clubs or organizations: Not on file    Relationship status: Not on file  . Intimate partner violence    Fear of current or ex partner: Not on file    Emotionally abused: Not on file    Physically abused: Not on file    Forced sexual activity: Not on file  Other Topics Concern  . Not on file  Social History Narrative   ** Merged History Encounter **          Review of Systems  Constitutional: Negative.  Negative for chills,  fatigue and unexpected weight change.  HENT: Negative.  Negative for congestion, rhinorrhea, sneezing and sore throat.   Eyes: Negative for redness.  Respiratory: Negative.  Negative for cough, chest tightness and shortness of breath.   Cardiovascular: Positive for leg swelling. Negative for chest pain and palpitations.  Gastrointestinal: Negative.  Negative for abdominal pain, constipation, diarrhea, nausea and vomiting.  Endocrine: Negative.   Genitourinary: Negative.  Negative for dysuria and frequency.  Musculoskeletal: Negative.  Negative for arthralgias, back pain, joint swelling and neck pain.  Skin: Positive for wound. Negative for rash.  Allergic/Immunologic: Negative.   Neurological: Negative.  Negative for tremors and numbness.  Hematological: Negative for adenopathy. Does not bruise/bleed easily.  Psychiatric/Behavioral: Negative.  Negative for behavioral problems, sleep disturbance and suicidal ideas. The patient is not nervous/anxious.     Vital Signs: BP 140/86   Pulse 97   Resp 16   Ht 5\' 10"  (1.778 m)   Wt 230 lb (104.3 kg)   SpO2 97%   BMI 33.00 kg/m    Physical Exam Vitals signs and nursing note reviewed.  Constitutional:      General: He is not in acute distress.    Appearance: He is well-developed. He is not diaphoretic.  HENT:     Head: Normocephalic and atraumatic.     Mouth/Throat:     Pharynx: No oropharyngeal exudate.  Eyes:     Pupils: Pupils are equal, round, and reactive to light.  Neck:     Musculoskeletal: Normal range of motion and neck supple.     Thyroid: No thyromegaly.     Vascular: No JVD.     Trachea: No tracheal deviation.  Cardiovascular:     Rate and Rhythm: Normal rate and regular rhythm.     Heart sounds: Normal heart sounds. No murmur. No friction rub. No gallop.   Pulmonary:     Effort: Pulmonary effort is normal. No respiratory distress.     Breath sounds: Normal breath sounds. No wheezing or rales.  Chest:     Chest  wall: No tenderness.  Abdominal:     Palpations: Abdomen is soft.     Tenderness: There is no abdominal tenderness. There is no guarding.  Musculoskeletal: Normal range of motion.     Right lower leg: Edema present.     Left lower leg: Edema present.  Lymphadenopathy:     Cervical: No cervical adenopathy.  Skin:    General: Skin is warm and dry.  Neurological:     Mental Status: He is alert and oriented to person, place, and time.     Cranial Nerves: No cranial nerve deficit.  Psychiatric:        Behavior: Behavior normal.        Thought Content: Thought content normal.        Judgment: Judgment normal.     Assessment/Plan: 1. Abrasion of right lower extremity, subsequent encounter Healing, continue to keep area clean, open to air.   2. Benign hypertension Stable, rechecked bp 140/86.     3. Nicotine dependence with current use Smoking cessation counseling: 1. Pt acknowledges the risks of long term smoking, she will try to quite smoking. 2. Options for different medications including nicotine products, chewing gum, patch etc, Wellbutrin and Chantix is discussed 3. Goal and date of compete cessation is discussed 4. Total time spent in smoking cessation is 15 min.   4. Prostate cancer ([place]) Continue to follow up with oncology as directed.   General Counseling: finnley larusso understanding of the findings of todays visit and agrees with plan of treatment. I have discussed any further diagnostic evaluation that may be needed or ordered today. We also reviewed his medications today. he has been encouraged to call the office with any questions or concerns that should arise related to todays visit.    No orders of the defined types were placed in this encounter.   Meds ordered this encounter  Medications  . furosemide (LASIX) 20 MG tablet    Sig: Take 1 tablet (20 mg total) by mouth daily.    Dispense:  5 tablet    Refill:  0    Time spent: 25 Minutes   This  patient was seen by Orson Gear AGNP-C in Collaboration with Dr Lavera Guise as a part of collaborative care agreement     Kendell Bane AGNP-C Internal medicine

## 2018-10-15 NOTE — Progress Notes (Signed)
[facility]  Telephone:([phone]) [phone] Fax:(336) [phone]  ID: Gerald Odonnell OB: 01-05-1956  MR#: [id number]  IRW#:[id number]  Patient Care Team: Lavera Guise, MD as PCP - General (Internal Medicine) Ronnell Freshwater, NP (Family Medicine)  CHIEF COMPLAINT: Prostate cancer metastatic to bone  INTERVAL HISTORY: Patient returns to clinic today for repeat laboratory work, further evaluation and continuation of Zytiga and prednisone.  He currently feels well and is asymptomatic.  He is tolerating his treatment without significant side effects. He continues to have urinary retention and has a suprapubic tube.  He has no neurologic complaints.  He denies any recent fevers or illnesses.  He has a good appetite and denies weight loss.  He denies any pain.  He denies any chest pain, shortness of breath, cough, or hemoptysis.  He denies any nausea, vomiting, constipation, or diarrhea.  Patient feels at his baseline offers no specific complaints today.  REVIEW OF SYSTEMS:   Review of Systems  Constitutional: Negative.  Negative for fever, malaise/fatigue and weight loss.  Respiratory: Negative.  Negative for cough, hemoptysis and shortness of breath.   Cardiovascular: Negative.  Negative for chest pain and leg swelling.  Gastrointestinal: Negative.  Negative for abdominal pain, blood in stool and melena.  Genitourinary: Negative.  Negative for dysuria and hematuria.  Musculoskeletal: Negative.  Negative for back pain.  Skin: Negative.  Negative for rash.  Neurological: Negative.  Negative for focal weakness, weakness and headaches.  Psychiatric/Behavioral: Negative.  The patient is not nervous/anxious.     As per HPI. Otherwise, a complete review of systems is negative.  PAST MEDICAL HISTORY: Past Medical History:  Diagnosis Date  . Cancer ([place])    colon  . Colon cancer ([place])    Hx of  . History of kidney stones   . Hypertension   . Prostate cancer ([place])   . Prostate  cancer ([place])    Hx of   . S/P colon resection   . Shortness of breath dyspnea   . Suprapubic catheter ([place])   . Urinary retention     PAST SURGICAL HISTORY: Past Surgical History:  Procedure Laterality Date  . ABDOMINAL SURGERY    . COLON SURGERY    . CYSTOSCOPY WITH LITHOLAPAXY N/A 06/09/2017   Procedure: CYSTOSCOPY WITH LITHOLAPAXY;  Surgeon: Hollice Espy, MD;  Location: [facility] ORS;  Service: Urology;  Laterality: N/A;  . PROSTATE SURGERY  05/25/2016  . SUPRAPUBIC CATHETER INSERTION      FAMILY HISTORY: Family History  Problem Relation Age of Onset  . Hypertension Other   . Alcohol abuse Mother   . Heart attack Father   . Prostate cancer Brother   . Breast cancer Sister     ADVANCED DIRECTIVES (Y/N):  N  HEALTH MAINTENANCE: Social History   Tobacco Use  . Smoking status: Current Every Day Smoker    Packs/day: 0.25    Years: 40.00    Pack years: 10.00    Types: Cigarettes  . Smokeless tobacco: Never Used  . Tobacco comment: 1pack a week  Substance Use Topics  . Alcohol use: Yes    Alcohol/week: 60.0 standard drinks    Types: 60 Cans of beer per week    Comment: Reports drinking a 12 pack a day if available  . Drug use: No     Colonoscopy:  PAP:  Bone density:  Lipid panel:  No Known Allergies  Current Outpatient Medications  Medication Sig Dispense Refill  . abiraterone acetate (ZYTIGA) 250 MG  tablet TAKE 4 TABLETS (1,000 MG TOTAL) BY MOUTH DAILY. TAKE ON AN EMPTY STOMACH 1 HOUR BEFORE OR 2 HOURS AFTER A MEAL. 120 tablet 3  . oxybutynin (DITROPAN) 5 MG tablet Take 1 tablet (5 mg total) by mouth 4 (four) times daily. 120 tablet 6  . predniSONE (DELTASONE) 5 MG tablet TAKE 1 TABLET BY MOUTH ONCE DAILY WITH BREAKFAST (Patient taking differently: Take 5 mg by mouth daily. ) 30 tablet 3  . furosemide (LASIX) 20 MG tablet tak eone tab po qd for swelling 30 tablet 1   No current facility-administered medications for this visit.     OBJECTIVE: Vitals:    10/20/18 1020  BP: (!) 142/93  Pulse: 96  Resp: 18     Body mass index is 32.57 kg/m.    ECOG FS:0 - Asymptomatic   General: Well-developed, well-nourished, no acute distress. Eyes: Pink conjunctiva, anicteric sclera. HEENT: Normocephalic, moist mucous membranes. Lungs: Clear to auscultation bilaterally. Heart: Regular rate and rhythm. No rubs, murmurs, or gallops. Abdomen: Soft, nontender, nondistended. No organomegaly noted, normoactive bowel sounds. Musculoskeletal: No edema, cyanosis, or clubbing. Neuro: Alert, answering all questions appropriately. Cranial nerves grossly intact. Skin: No rashes or petechiae noted. Psych: Normal affect.  LAB RESULTS:  Lab Results  Component Value Date   NA 138 10/20/2018   K 3.9 10/20/2018   CL 104 10/20/2018   CO2 26 10/20/2018   GLUCOSE 126 (H) 10/20/2018   BUN 14 10/20/2018   CREATININE 1.00 10/20/2018   CALCIUM 9.4 10/20/2018   PROT 7.7 10/20/2018   ALBUMIN 4.2 10/20/2018   AST 33 10/20/2018   ALT 27 10/20/2018   ALKPHOS 90 10/20/2018   BILITOT 0.9 10/20/2018   GFRNONAA >60 10/20/2018   GFRAA >60 10/20/2018    Lab Results  Component Value Date   WBC 7.5 10/20/2018   NEUTROABS 5.7 10/20/2018   HGB 15.5 10/20/2018   HCT 44.4 10/20/2018   MCV 103.3 (H) 10/20/2018   PLT 117 (L) 10/20/2018     STUDIES: No results found.  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 In 2017 upon diagnosis, he reportedly received XRT along with brachii therapy.  He last received Lupron in September 2018.  Patient states he had allergic reaction to Lupron and refuses to take any further injections.  He also was given a prescription for Casodex by urology, but has refused to take this as well.  He states he  will never take any chemotherapy, but reluctantly agreed to initiate Zytiga plus prednisone.  Typically in castrate sensitive disease should be treated with both Zytiga and Lupron, but patient is refusing injections as above.  Patient is tolerating his treatments well without significant side effects.  His PSA continues to trend down and is now 2.40. Continue Zytiga and prednisone indefinitely.  Return to clinic in 6 weeks for laboratory work only and then in 3 months for laboratory work and further evaluation.   2.  Suprapubic catheter: Patient states he has home health to manage it.  He does not want to return to urology at this time.     I spent a total of 30 minutes face-to-face with the patient of which greater than 50% of the visit was spent in counseling and coordination of care as  detailed above.   Patient expressed understanding and was in agreement with this plan. He also understands that He can call clinic at any time with any questions, concerns, or complaints.   Cancer Staging Prostate cancer [facility], [place]) Staging form: Prostate, AJCC 8th Edition - Clinical stage from 03/04/2018: Stage IVB (cT3a, cN0, cM1b, Grade Group: 5) - Signed by Lloyd Huger, MD on 03/04/2018   Lloyd Huger, MD   10/22/2018 6:47 AM

## 2018-10-19 ENCOUNTER — Other Ambulatory Visit: Payer: Self-pay

## 2018-10-20 ENCOUNTER — Other Ambulatory Visit: Payer: Self-pay

## 2018-10-20 ENCOUNTER — Encounter: Payer: Self-pay | Admitting: Oncology

## 2018-10-20 ENCOUNTER — Telehealth: Payer: Self-pay | Admitting: Internal Medicine

## 2018-10-20 ENCOUNTER — Other Ambulatory Visit: Payer: Self-pay | Admitting: Internal Medicine

## 2018-10-20 ENCOUNTER — Inpatient Hospital Stay: Payer: Medicare HMO | Attending: Oncology

## 2018-10-20 ENCOUNTER — Inpatient Hospital Stay (HOSPITAL_BASED_OUTPATIENT_CLINIC_OR_DEPARTMENT_OTHER): Payer: Medicare HMO | Admitting: Oncology

## 2018-10-20 VITALS — BP 142/93 | HR 96 | Resp 18 | Wt 227.0 lb

## 2018-10-20 DIAGNOSIS — C61 Malignant neoplasm of prostate: Secondary | ICD-10-CM

## 2018-10-20 DIAGNOSIS — C7951 Secondary malignant neoplasm of bone: Secondary | ICD-10-CM | POA: Insufficient documentation

## 2018-10-20 DIAGNOSIS — I10 Essential (primary) hypertension: Secondary | ICD-10-CM | POA: Diagnosis not present

## 2018-10-20 DIAGNOSIS — F17210 Nicotine dependence, cigarettes, uncomplicated: Secondary | ICD-10-CM

## 2018-10-20 DIAGNOSIS — R609 Edema, unspecified: Secondary | ICD-10-CM

## 2018-10-20 LAB — COMPREHENSIVE METABOLIC PANEL
ALT: 27 U/L (ref 0–44)
AST: 33 U/L (ref 15–41)
Albumin: 4.2 g/dL (ref 3.5–5.0)
Alkaline Phosphatase: 90 U/L (ref 38–126)
Anion gap: 8 (ref 5–15)
BUN: 14 mg/dL (ref 8–23)
CO2: 26 mmol/L (ref 22–32)
Calcium: 9.4 mg/dL (ref 8.9–10.3)
Chloride: 104 mmol/L (ref 98–111)
Creatinine, Ser: 1 mg/dL (ref 0.61–1.24)
GFR calc Af Amer: >60 mL/min (ref >60)
GFR calc non Af Amer: >60 mL/min (ref >60)
Glucose, Bld: 126 mg/dL — ABNORMAL HIGH (ref 70–99)
Potassium: 3.9 mmol/L (ref 3.5–5.1)
Sodium: 138 mmol/L (ref 135–145)
Total Bilirubin: 0.9 mg/dL (ref 0.3–1.2)
Total Protein: 7.7 g/dL (ref 6.5–8.1)

## 2018-10-20 LAB — CBC WITH DIFFERENTIAL/PLATELET
Abs Immature Granulocytes: 0.02 10*3/uL (ref 0.00–0.07)
Basophils Absolute: 0 10*3/uL (ref 0.0–0.1)
Basophils Relative: 1 %
Eosinophils Absolute: 0.1 10*3/uL (ref 0.0–0.5)
Eosinophils Relative: 1 %
HCT: 44.4 % (ref 39.0–52.0)
Hemoglobin: 15.5 g/dL (ref 13.0–17.0)
Immature Granulocytes: 0 %
Lymphocytes Relative: 15 %
Lymphs Abs: 1.1 10*3/uL (ref 0.7–4.0)
MCH: 36 pg — ABNORMAL HIGH (ref 26.0–34.0)
MCHC: 34.9 g/dL (ref 30.0–36.0)
MCV: 103.3 fL — ABNORMAL HIGH (ref 80.0–100.0)
Monocytes Absolute: 0.6 10*3/uL (ref 0.1–1.0)
Monocytes Relative: 8 %
Neutro Abs: 5.7 10*3/uL (ref 1.7–7.7)
Neutrophils Relative %: 75 %
Platelets: 117 10*3/uL — ABNORMAL LOW (ref 150–400)
RBC: 4.3 MIL/uL (ref 4.22–5.81)
RDW: 12.2 % (ref 11.5–15.5)
WBC: 7.5 10*3/uL (ref 4.0–10.5)
nRBC: 0 % (ref 0.0–0.2)

## 2018-10-20 LAB — PHOSPHORUS: Phosphorus: 3.2 mg/dL (ref 2.5–4.6)

## 2018-10-20 LAB — MAGNESIUM: Magnesium: 2.1 mg/dL (ref 1.7–2.4)

## 2018-10-20 LAB — PSA: Prostatic Specific Antigen: 2.4 ng/mL (ref 0.00–4.00)

## 2018-10-20 MED ORDER — FUROSEMIDE 20 MG PO TABS: ORAL_TABLET | ORAL | 1 refills | Status: DC

## 2018-10-20 NOTE — Progress Notes (Signed)
RX for lasix is sent, will need Bmp in one week

## 2018-10-20 NOTE — Progress Notes (Signed)
Patient reports decreased appetite.

## 2018-10-26 ENCOUNTER — Other Ambulatory Visit: Payer: Self-pay | Admitting: Oncology

## 2018-10-26 DIAGNOSIS — C61 Malignant neoplasm of prostate: Secondary | ICD-10-CM

## 2018-11-04 MED FILL — predniSONE 5 MG TABS: 5 | 30 days supply | Qty: 30 | Fill #0

## 2018-11-16 MED FILL — ABIRATERONE ACETATE 250 MG: 250 | 30 days supply | Qty: 120 | Fill #0

## 2018-11-18 ENCOUNTER — Other Ambulatory Visit: Payer: Self-pay | Admitting: Internal Medicine

## 2018-11-18 DIAGNOSIS — R609 Edema, unspecified: Secondary | ICD-10-CM

## 2018-11-19 ENCOUNTER — Emergency Department
Admission: EM | Admit: 2018-11-19 | Discharge: 2018-11-20 | Disposition: A | Discharge status: Home / Self Care | Payer: Medicare HMO | Attending: Emergency Medicine | Admitting: Emergency Medicine

## 2018-11-19 ENCOUNTER — Other Ambulatory Visit: Payer: Self-pay

## 2018-11-19 DIAGNOSIS — I10 Essential (primary) hypertension: Secondary | ICD-10-CM | POA: Diagnosis not present

## 2018-11-19 DIAGNOSIS — Y732 Prosthetic and other implants, materials and accessory gastroenterology and urology devices associated with adverse incidents: Secondary | ICD-10-CM | POA: Insufficient documentation

## 2018-11-19 DIAGNOSIS — T83091A Other mechanical complication of indwelling urethral catheter, initial encounter: Secondary | ICD-10-CM | POA: Diagnosis not present

## 2018-11-19 DIAGNOSIS — F17210 Nicotine dependence, cigarettes, uncomplicated: Secondary | ICD-10-CM | POA: Diagnosis not present

## 2018-11-19 DIAGNOSIS — R339 Retention of urine, unspecified: Secondary | ICD-10-CM | POA: Diagnosis not present

## 2018-11-19 DIAGNOSIS — N3001 Acute cystitis with hematuria: Secondary | ICD-10-CM | POA: Diagnosis not present

## 2018-11-19 DIAGNOSIS — Z79899 Other long term (current) drug therapy: Secondary | ICD-10-CM | POA: Insufficient documentation

## 2018-11-19 LAB — CBC
HCT: 44.6 % (ref 39.0–52.0)
Hemoglobin: 15.9 g/dL (ref 13.0–17.0)
MCH: 35.5 pg — ABNORMAL HIGH (ref 26.0–34.0)
MCHC: 35.7 g/dL (ref 30.0–36.0)
MCV: 99.6 fL (ref 80.0–100.0)
Platelets: 166 10*3/uL (ref 150–400)
RBC: 4.48 MIL/uL (ref 4.22–5.81)
RDW: 12.1 % (ref 11.5–15.5)
WBC: 12.5 10*3/uL — ABNORMAL HIGH (ref 4.0–10.5)
nRBC: 0 % (ref 0.0–0.2)

## 2018-11-19 LAB — COMPREHENSIVE METABOLIC PANEL
ALT: 22 U/L (ref 0–44)
AST: 32 U/L (ref 15–41)
Albumin: 4.3 g/dL (ref 3.5–5.0)
Alkaline Phosphatase: 99 U/L (ref 38–126)
Anion gap: 12 (ref 5–15)
BUN: 15 mg/dL (ref 8–23)
CO2: 21 mmol/L — ABNORMAL LOW (ref 22–32)
Calcium: 9.4 mg/dL (ref 8.9–10.3)
Chloride: 102 mmol/L (ref 98–111)
Creatinine, Ser: 0.76 mg/dL (ref 0.61–1.24)
GFR calc Af Amer: >60 mL/min (ref >60)
GFR calc non Af Amer: >60 mL/min (ref >60)
Glucose, Bld: 106 mg/dL — ABNORMAL HIGH (ref 70–99)
Potassium: 3.6 mmol/L (ref 3.5–5.1)
Sodium: 135 mmol/L (ref 135–145)
Total Bilirubin: 0.8 mg/dL (ref 0.3–1.2)
Total Protein: 7.9 g/dL (ref 6.5–8.1)

## 2018-11-19 MED ORDER — LIDOCAINE HCL URETHRAL/MUCOSAL 2 % EX GEL
1.0000 "application " | Freq: Once | CUTANEOUS | Status: AC
  Administered 2018-11-20: 1 via TOPICAL
  Filled 2018-11-19: qty 10

## 2018-11-19 NOTE — ED Provider Notes (Signed)
[facility] Emergency Department Provider Note  ____________________________________________   First MD Initiated Contact with Patient 11/19/18 2346     (approximate)  I have reviewed the triage vital signs and the nursing notes.   HISTORY  Chief Complaint Urinary Retention    HPI Gerald Odonnell is a 63 y.o. male  With PMHx chronic urinary retention, h/o prostate and colon CA, here with decreased foley output and pain around SP catheter.  Pt states that over the past 24 hours, he's had decreased output from his SP catheter. Starting this afternoon, he had significant drop in output with assocaited pain, fullness along his lower abdomen. Sx feel similar to episodes in which he's had urinary obstruction before. Since his urinary retention began, however, he has now developed increasing lower abd pain, as well as had multiple episodes in which he feels the urge to have a BM followed by passage of a small amount, fi any, of stool. No overt diarrhea. No fever or chills. No flank pain. No specific alleviating factors. He does not some small output from his SP cath today.       Past Medical History:  Diagnosis Date  . Cancer ([place])    colon  . Colon cancer ([place])    Hx of  . History of kidney stones   . Hypertension   . Prostate cancer ([place])   . Prostate cancer ([place])    Hx of   . S/P colon resection   . Shortness of breath dyspnea   . Suprapubic catheter ([place])   . Urinary retention     Patient Active Problem List   Diagnosis Date Noted  . Benign hypertension 08/22/2017  . Goals of care, counseling/discussion 08/22/2017  . Dysuria 08/22/2017  . Urinary retention 03/15/2017  . Prostate cancer ([place]) 03/15/2017  . Abscess 01/24/2016  . Sepsis ([place]) 09/10/2015  . Chest pain 09/10/2015  . 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 Clostridium difficile diarrhea   . Colitis, infectious 04/23/2015  . Incomplete bladder emptying 09/10/2014  . Edema leg 09/15/2013   . Pelvic and perineal pain 09/15/2013  . Lower extremity venous stasis 09/15/2013  . Pelvic pain in male 09/15/2013  . Benign prostatic hyperplasia with urinary obstruction 07/22/2013    Past Surgical History:  Procedure Laterality Date  . ABDOMINAL SURGERY    . COLON SURGERY    . CYSTOSCOPY WITH LITHOLAPAXY N/A 06/09/2017   Procedure: CYSTOSCOPY WITH LITHOLAPAXY;  Surgeon: Hollice Espy, MD;  Location: [facility] ORS;  Service: Urology;  Laterality: N/A;  . PROSTATE SURGERY  05/25/2016  . SUPRAPUBIC CATHETER INSERTION      Prior to Admission medications   Medication Sig Start Date End Date Taking? Authorizing Provider  abiraterone acetate (ZYTIGA) 250 MG tablet TAKE 4 TABLETS (1,000 MG TOTAL) BY MOUTH DAILY. TAKE ON AN EMPTY STOMACH 1 HOUR BEFORE OR 2 HOURS AFTER A MEAL. 10/26/18   Lloyd Huger, MD  furosemide (LASIX) 20 MG tablet TAKE ONE TABLET EVERY DAY AS NEEDED FOR SWELLING 11/18/18   [place], Audie Clear, NP  levofloxacin (LEVAQUIN) 750 MG tablet Take 1 tablet (750 mg total) by mouth daily for 7 days. 11/20/18 11/27/18  Duffy Bruce, MD  oxybutynin (DITROPAN) 5 MG tablet Take 1 tablet (5 mg total) by mouth 4 (four) times daily. 08/24/18   Zara Council A, PA-C  oxyCODONE (OXY IR/ROXICODONE) 5 MG immediate release tablet Take 1 tablet (5 mg total) by mouth every 6 (six) hours as needed for pain. 11/20/18  Duffy Bruce, MD  predniSONE (DELTASONE) 5 MG tablet TAKE 1 TABLET BY MOUTH ONCE DAILY WITH BREAKFAST 10/26/18   Lloyd Huger, MD    Allergies Patient has no known allergies.  Family History  Problem Relation Age of Onset  . Hypertension Other   . Alcohol abuse Mother   . Heart attack Father   . Prostate cancer Brother   . Breast cancer Sister     Social History Social History   Tobacco Use  . Smoking status: Current Every Day Smoker    Packs/day: 0.25    Years: 40.00    Pack years: 10.00    Types: Cigarettes  . Smokeless tobacco: Never Used  . Tobacco  comment: 1pack a week  Substance Use Topics  . Alcohol use: Yes    Alcohol/week: 60.0 standard drinks    Types: 60 Cans of beer per week    Comment: Reports drinking a 12 pack a day if available  . Drug use: No    Review of Systems  Review of Systems  Constitutional: Positive for fatigue. Negative for chills and fever.  HENT: Negative for sore throat.   Respiratory: Negative for shortness of breath.   Cardiovascular: Negative for chest pain.  Gastrointestinal: Positive for abdominal pain.  Genitourinary: Positive for decreased urine volume. Negative for flank pain.  Musculoskeletal: Negative for neck pain.  Skin: Negative for rash and wound.  Allergic/Immunologic: Negative for immunocompromised state.  Neurological: Positive for weakness. Negative for numbness.  Hematological: Does not bruise/bleed easily.     ____________________________________________  PHYSICAL EXAM:      VITAL SIGNS: ED Triage Vitals  Enc Vitals Group     BP 11/19/18 2049 (!) 160/93     Pulse Rate 11/19/18 2049 (!) 127     Resp 11/19/18 2049 18     Temp 11/19/18 2049 97.9 F (36.6 C)     Temp src --      SpO2 11/19/18 2049 96 %     Weight 11/19/18 2050 230 lb (104.3 kg)     Height 11/19/18 2050 5\' 11"  (1.803 m)     Head Circumference --      Peak Flow --      Pain Score 11/19/18 2049 10     Pain Loc --      Pain Edu? --      Excl. in [place]? --      Physical Exam Vitals signs and nursing note reviewed.  Constitutional:      General: He is not in acute distress.    Appearance: He is well-developed.     Comments: Appears uncomfortable  HENT:     Head: Normocephalic and atraumatic.  Eyes:     Conjunctiva/sclera: Conjunctivae normal.  Neck:     Musculoskeletal: Neck supple.  Cardiovascular:     Rate and Rhythm: Normal rate and regular rhythm.     Heart sounds: Normal heart sounds.  Pulmonary:     Effort: Pulmonary effort is normal. No respiratory distress.     Breath sounds: No wheezing.   Abdominal:     General: Abdomen is flat. There is distension.     Tenderness: There is abdominal tenderness (suprapubic).     Comments: No CVAT bilaterally  Skin:    General: Skin is warm.     Capillary Refill: Capillary refill takes less than 2 seconds.     Findings: No rash.  Neurological:     Mental Status: He is alert and oriented to person, place,  and time.     Motor: No abnormal muscle tone.       ____________________________________________   LABS (all labs ordered are listed, but only abnormal results are displayed)  Labs Reviewed  CBC - Abnormal; Notable for the following components:      Result Value   WBC 12.5 (*)    MCH 35.5 (*)    All other components within normal limits  COMPREHENSIVE METABOLIC PANEL - Abnormal; Notable for the following components:   CO2 21 (*)    Glucose, Bld 106 (*)    All other components within normal limits  URINALYSIS, COMPLETE (UACMP) WITH MICROSCOPIC - Abnormal; Notable for the following components:   Color, Urine YELLOW (*)    APPearance HAZY (*)    Hgb urine dipstick MODERATE (*)    Nitrite POSITIVE (*)    Leukocytes,Ua SMALL (*)    Bacteria, UA MANY (*)    All other components within normal limits  URINE CULTURE    ____________________________________________  EKG: None ________________________________________  RADIOLOGY All imaging, including plain films, CT scans, and ultrasounds, independently reviewed by me, and interpretations confirmed via formal radiology reads.  ED MD interpretation:   None  Official radiology report(s): No results found.  ____________________________________________  PROCEDURES   Procedure(s) performed (including Critical Care):  BLADDER CATHETERIZATION  Date/Time: 11/20/2018 12:28 AM Performed by: Duffy Bruce, MD Authorized by: Duffy Bruce, MD   Consent:    Consent obtained:  Verbal   Consent given by:  Patient   Risks discussed:  False passage, incomplete procedure,  infection and pain   Alternatives discussed:  Alternative treatment Pre-procedure details:    Procedure purpose:  Therapeutic   Preparation: Patient was prepped and draped in usual sterile fashion   Anesthesia (see MAR for exact dosages):    Anesthesia method:  Topical application   Topical anesthetic:  Lidocaine gel Procedure details:    Provider performed due to:  Complicated insertion   Catheter insertion:  Temporary indwelling   Catheter type:  Foley   Catheter size:  16 Fr   Number of attempts:  1   Urine characteristics:  Cloudy Post-procedure details:    Patient tolerance of procedure:  Tolerated well, no immediate complications    ____________________________________________  INITIAL IMPRESSION / MDM / ASSESSMENT AND PLAN / ED COURSE  As part of my medical decision making, I reviewed the following data within the [place] Notes from prior ED visits and [place] Controlled Substance Database      *Antwion K Odonnell was evaluated in Emergency Department on 11/20/2018 for the symptoms described in the history of present illness. He was evaluated in the context of the global COVID-19 pandemic, which necessitated consideration that the patient might be at risk for infection with the SARS-CoV-2 virus that causes COVID-19. Institutional protocols and algorithms that pertain to the evaluation of patients at risk for COVID-19 are in a state of rapid change based on information released by regulatory bodies including the CDC and federal and state organizations. These policies and algorithms were followed during the patient's care in the ED.  Some ED evaluations and interventions may be delayed as a result of limited staffing during the pandemic.*   Clinical Course as of Nov 20 151  Sun Nov 20, 2018  0041 63 yo M here with acute on chronic urinary retention w/ tenesmus. Suspect blocked SP cath, likely 2/2 possible mild UTI. No fever, no flank pain or s/s pyelo. Pt is afebrile  and non-toxic.  Tachycardic on arrival likely 2/2 pain. SP cath replaced atraumatically by myself, tolerated well though w/ moderate bladder spasms. >300 cc cloudy urine returned. Suspect his sensation of tenesmus is 2/2 hi bladder distension - no actual preceding abd pain, dirrahea, fever, abd TTP or signs to suggest concomitant diverticulitis or colitis. Will f/u UA, give analgesics, re-assess.   [CI]    Clinical Course User Index [CI] Duffy Bruce, MD    Medical Decision Making: As above. 63 yo M here w/ SP cath obstruction, now resolved after replacement by myself. UA c/w UTI, which is likely etiology. Reviewed old cultures - will give Levaquin based on prior sensitivites, d/c with brief course of analgesics. No fever, HR improved w/ foley replacement, no signs of sepsis or pyelo.  ____________________________________________  FINAL CLINICAL IMPRESSION(S) / ED DIAGNOSES  Final diagnoses:  Urinary retention  Obstruction of Foley catheter, initial encounter ([place])  Acute cystitis with hematuria     MEDICATIONS GIVEN DURING THIS VISIT:  Medications  lidocaine (XYLOCAINE) 2 % jelly 1 application (1 application Topical Given by Other 11/20/18 0008)  HYDROcodone-acetaminophen (NORCO/VICODIN) 5-325 MG per tablet 2 tablet (2 tablets Oral Given 11/20/18 0038)  levofloxacin (LEVAQUIN) tablet 750 mg (750 mg Oral Given 11/20/18 0131)     ED Discharge Orders         Ordered    levofloxacin (LEVAQUIN) 750 MG tablet  Daily     11/20/18 0058    oxyCODONE (OXY IR/ROXICODONE) 5 MG immediate release tablet  Every 6 hours PRN     11/20/18 0058           Note:  This document was prepared using Dragon voice recognition software and may include unintentional dictation errors.   Duffy Bruce, MD 11/20/18 [phone]

## 2018-11-19 NOTE — ED Triage Notes (Signed)
Patient c/o urinary retention and pelvic pain. Patient reports having a suprapubic catheter. Patient's catheter was changed 10 days ago for same problem.

## 2018-11-20 LAB — URINALYSIS, COMPLETE (UACMP) WITH MICROSCOPIC
Bilirubin Urine: NEGATIVE
Glucose, UA: NEGATIVE mg/dL
Ketones, ur: NEGATIVE mg/dL
Nitrite: POSITIVE — AB
Protein, ur: NEGATIVE mg/dL
Specific Gravity, Urine: 1.01 (ref 1.005–1.030)
Squamous Epithelial / LPF: NONE SEEN (ref 0–5)
pH: 6 (ref 5.0–8.0)

## 2018-11-20 MED ORDER — OXYCODONE HCL 5 MG PO TABS: 5.0000 mg | ORAL_TABLET | Freq: Four times a day (QID) | ORAL | 0 refills | Status: DC | PRN

## 2018-11-20 MED ORDER — LEVOFLOXACIN 750 MG PO TABS
750.0000 mg | ORAL_TABLET | Freq: Once | ORAL | Status: AC
  Administered 2018-11-20: 02:00:00 750 mg via ORAL
  Filled 2018-11-20: qty 1

## 2018-11-20 MED ORDER — HYDROCODONE-ACETAMINOPHEN 5-325 MG PO TABS
2.0000 | ORAL_TABLET | Freq: Once | ORAL | Status: AC
  Administered 2018-11-20: 2 via ORAL
  Filled 2018-11-20: qty 2

## 2018-11-20 MED ORDER — LEVOFLOXACIN 750 MG PO TABS: 750.0000 mg | ORAL_TABLET | Freq: Every day | ORAL | 0 refills | Status: AC

## 2018-11-20 NOTE — ED Notes (Signed)
Dr. Myrene Buddy in to replace suprapubic cath.

## 2018-11-21 LAB — URINE CULTURE

## 2018-12-01 ENCOUNTER — Inpatient Hospital Stay: Payer: Medicare HMO

## 2018-12-01 ENCOUNTER — Telehealth: Payer: Self-pay | Admitting: *Deleted

## 2018-12-01 MED FILL — predniSONE 5 MG TABS: 5 | 30 days supply | Qty: 30 | Fill #1

## 2018-12-01 NOTE — Telephone Encounter (Signed)
Dr. Finnegan patient

## 2018-12-01 NOTE — Telephone Encounter (Signed)
Patient called stating he cannot come in today for his lab appointment because he is running fever, vomiting, diarrhea, and headache. He wants to reschedule todays lab appointment. Please advise.

## 2018-12-01 NOTE — Telephone Encounter (Signed)
Called pt and r\s lab appt to 8/12.

## 2018-12-06 ENCOUNTER — Other Ambulatory Visit: Payer: Self-pay

## 2018-12-07 ENCOUNTER — Inpatient Hospital Stay: Payer: Medicare HMO | Attending: Oncology

## 2018-12-07 ENCOUNTER — Other Ambulatory Visit: Payer: Self-pay

## 2018-12-07 DIAGNOSIS — C7951 Secondary malignant neoplasm of bone: Secondary | ICD-10-CM | POA: Insufficient documentation

## 2018-12-07 DIAGNOSIS — C61 Malignant neoplasm of prostate: Secondary | ICD-10-CM

## 2018-12-07 LAB — CBC WITH DIFFERENTIAL/PLATELET
Abs Immature Granulocytes: 0.04 10*3/uL (ref 0.00–0.07)
Basophils Absolute: 0.1 10*3/uL (ref 0.0–0.1)
Basophils Relative: 1 %
Eosinophils Absolute: 0 10*3/uL (ref 0.0–0.5)
Eosinophils Relative: 1 %
HCT: 42.9 % (ref 39.0–52.0)
Hemoglobin: 15.3 g/dL (ref 13.0–17.0)
Immature Granulocytes: 1 %
Lymphocytes Relative: 15 %
Lymphs Abs: 1.1 10*3/uL (ref 0.7–4.0)
MCH: 35.7 pg — ABNORMAL HIGH (ref 26.0–34.0)
MCHC: 35.7 g/dL (ref 30.0–36.0)
MCV: 100.2 fL — ABNORMAL HIGH (ref 80.0–100.0)
Monocytes Absolute: 0.6 10*3/uL (ref 0.1–1.0)
Monocytes Relative: 8 %
Neutro Abs: 5.5 10*3/uL (ref 1.7–7.7)
Neutrophils Relative %: 74 %
Platelets: 120 10*3/uL — ABNORMAL LOW (ref 150–400)
RBC: 4.28 MIL/uL (ref 4.22–5.81)
RDW: 12.5 % (ref 11.5–15.5)
WBC: 7.3 10*3/uL (ref 4.0–10.5)
nRBC: 0 % (ref 0.0–0.2)

## 2018-12-07 LAB — COMPREHENSIVE METABOLIC PANEL
ALT: 15 U/L (ref 0–44)
AST: 22 U/L (ref 15–41)
Albumin: 4 g/dL (ref 3.5–5.0)
Alkaline Phosphatase: 102 U/L (ref 38–126)
Anion gap: 10 (ref 5–15)
BUN: 11 mg/dL (ref 8–23)
CO2: 25 mmol/L (ref 22–32)
Calcium: 9.4 mg/dL (ref 8.9–10.3)
Chloride: 104 mmol/L (ref 98–111)
Creatinine, Ser: 0.67 mg/dL (ref 0.61–1.24)
GFR calc Af Amer: >60 mL/min (ref >60)
GFR calc non Af Amer: >60 mL/min (ref >60)
Glucose, Bld: 119 mg/dL — ABNORMAL HIGH (ref 70–99)
Potassium: 4.1 mmol/L (ref 3.5–5.1)
Sodium: 139 mmol/L (ref 135–145)
Total Bilirubin: 1.2 mg/dL (ref 0.3–1.2)
Total Protein: 7.7 g/dL (ref 6.5–8.1)

## 2018-12-07 LAB — PHOSPHORUS: Phosphorus: 3.8 mg/dL (ref 2.5–4.6)

## 2018-12-07 LAB — MAGNESIUM: Magnesium: 2.1 mg/dL (ref 1.7–2.4)

## 2018-12-07 LAB — PSA: Prostatic Specific Antigen: 2.82 ng/mL (ref 0.00–4.00)

## 2018-12-08 ENCOUNTER — Ambulatory Visit: Payer: Self-pay | Admitting: Adult Health

## 2018-12-08 ENCOUNTER — Telehealth: Payer: Self-pay

## 2018-12-08 NOTE — Telephone Encounter (Signed)
Patient has appt in 2 weeks at office and he needs this appt to be counted as a face to face encounter for home health orders for [facility]. Beth

## 2018-12-13 ENCOUNTER — Other Ambulatory Visit: Payer: Self-pay | Admitting: Family Medicine

## 2018-12-13 DIAGNOSIS — N3289 Other specified disorders of bladder: Secondary | ICD-10-CM

## 2018-12-13 MED ORDER — OXYBUTYNIN CHLORIDE 5 MG PO TABS: 5.0000 mg | ORAL_TABLET | Freq: Four times a day (QID) | ORAL | 6 refills | Status: DC

## 2018-12-14 MED FILL — predniSONE 5 MG TABS: 5 | 30 days supply | Qty: 30 | Fill #2

## 2018-12-14 MED FILL — ABIRATERONE ACETATE 250 MG: 250 | 30 days supply | Qty: 120 | Fill #1

## 2018-12-25 ENCOUNTER — Encounter: Payer: Self-pay | Admitting: Emergency Medicine

## 2018-12-25 ENCOUNTER — Other Ambulatory Visit: Payer: Self-pay

## 2018-12-25 ENCOUNTER — Emergency Department
Admission: EM | Admit: 2018-12-25 | Discharge: 2018-12-25 | Disposition: A | Discharge status: Home / Self Care | Payer: Medicare HMO | Attending: Emergency Medicine | Admitting: Emergency Medicine

## 2018-12-25 DIAGNOSIS — T83010A Breakdown (mechanical) of cystostomy catheter, initial encounter: Secondary | ICD-10-CM | POA: Diagnosis not present

## 2018-12-25 DIAGNOSIS — R339 Retention of urine, unspecified: Secondary | ICD-10-CM

## 2018-12-25 DIAGNOSIS — F17210 Nicotine dependence, cigarettes, uncomplicated: Secondary | ICD-10-CM | POA: Insufficient documentation

## 2018-12-25 DIAGNOSIS — Z8546 Personal history of malignant neoplasm of prostate: Secondary | ICD-10-CM | POA: Insufficient documentation

## 2018-12-25 DIAGNOSIS — Z9350 Unspecified cystostomy status: Secondary | ICD-10-CM | POA: Insufficient documentation

## 2018-12-25 DIAGNOSIS — Z85830 Personal history of malignant neoplasm of bone: Secondary | ICD-10-CM | POA: Diagnosis not present

## 2018-12-25 DIAGNOSIS — R338 Other retention of urine: Secondary | ICD-10-CM | POA: Diagnosis not present

## 2018-12-25 DIAGNOSIS — Z79899 Other long term (current) drug therapy: Secondary | ICD-10-CM | POA: Diagnosis not present

## 2018-12-25 DIAGNOSIS — Y733 Surgical instruments, materials and gastroenterology and urology devices (including sutures) associated with adverse incidents: Secondary | ICD-10-CM | POA: Insufficient documentation

## 2018-12-25 DIAGNOSIS — Z85038 Personal history of other malignant neoplasm of large intestine: Secondary | ICD-10-CM | POA: Diagnosis not present

## 2018-12-25 LAB — CBC WITH DIFFERENTIAL/PLATELET
Abs Immature Granulocytes: 0.03 10*3/uL (ref 0.00–0.07)
Basophils Absolute: 0.1 10*3/uL (ref 0.0–0.1)
Basophils Relative: 1 %
Eosinophils Absolute: 0.1 10*3/uL (ref 0.0–0.5)
Eosinophils Relative: 1 %
HCT: 42.7 % (ref 39.0–52.0)
Hemoglobin: 15 g/dL (ref 13.0–17.0)
Immature Granulocytes: 0 %
Lymphocytes Relative: 14 %
Lymphs Abs: 1.1 10*3/uL (ref 0.7–4.0)
MCH: 35.2 pg — ABNORMAL HIGH (ref 26.0–34.0)
MCHC: 35.1 g/dL (ref 30.0–36.0)
MCV: 100.2 fL — ABNORMAL HIGH (ref 80.0–100.0)
Monocytes Absolute: 0.6 10*3/uL (ref 0.1–1.0)
Monocytes Relative: 7 %
Neutro Abs: 6.1 10*3/uL (ref 1.7–7.7)
Neutrophils Relative %: 77 %
Platelets: 122 10*3/uL — ABNORMAL LOW (ref 150–400)
RBC: 4.26 MIL/uL (ref 4.22–5.81)
RDW: 12.4 % (ref 11.5–15.5)
WBC: 8 10*3/uL (ref 4.0–10.5)
nRBC: 0 % (ref 0.0–0.2)

## 2018-12-25 LAB — URINALYSIS, COMPLETE (UACMP) WITH MICROSCOPIC
Bilirubin Urine: NEGATIVE
Glucose, UA: NEGATIVE mg/dL
Hgb urine dipstick: NEGATIVE
Ketones, ur: NEGATIVE mg/dL
Leukocytes,Ua: NEGATIVE
Nitrite: NEGATIVE
Protein, ur: 30 mg/dL — AB
Specific Gravity, Urine: 1.015 (ref 1.005–1.030)
Squamous Epithelial / HPF: NONE SEEN (ref 0–5)
pH: 8 (ref 5.0–8.0)

## 2018-12-25 LAB — BASIC METABOLIC PANEL
Anion gap: 8 (ref 5–15)
BUN: 16 mg/dL (ref 8–23)
CO2: 25 mmol/L (ref 22–32)
Calcium: 9.3 mg/dL (ref 8.9–10.3)
Chloride: 104 mmol/L (ref 98–111)
Creatinine, Ser: 0.79 mg/dL (ref 0.61–1.24)
GFR calc Af Amer: >60 mL/min (ref >60)
GFR calc non Af Amer: >60 mL/min (ref >60)
Glucose, Bld: 97 mg/dL (ref 70–99)
Potassium: 3.3 mmol/L — ABNORMAL LOW (ref 3.5–5.1)
Sodium: 137 mmol/L (ref 135–145)

## 2018-12-25 MED ORDER — LIDOCAINE HCL URETHRAL/MUCOSAL 2 % EX GEL
1.0000 "application " | Freq: Once | CUTANEOUS | Status: DC
  Filled 2018-12-25: qty 10

## 2018-12-25 NOTE — ED Notes (Signed)
No peripheral IV placed this visit.   Discharge instructions reviewed with patient. Questions fielded by this RN. Patient verbalizes understanding of instructions. Patient discharged home in stable condition per funke. No acute distress noted at time of discharge.

## 2018-12-25 NOTE — ED Provider Notes (Signed)
[facility] Emergency Department Provider Note  ____________________________________________   First MD Initiated Contact with Patient 12/25/18 2112     (approximate)  I have reviewed the triage vital signs and the nursing notes.   HISTORY  Chief Complaint catheter problems    HPI Gerald Odonnell is a 63 y.o. male with prostate cancer, colon cancer who presents with urinary retention secondary to his catheter not draining.  Patient had his catheter placed about 1 month ago.  It was supposed to be changed A few days ago but never was.  3 hour ago he started feeling a lot of pressure he is unable to have any urine output.  Patient is having severe pain in his lower abdomen that is constant, nothing makes better, nothing makes it worse.          Past Medical History:  Diagnosis Date   Cancer [facility])    colon   Cancer ([place])    bone cancer   Colon cancer ([place])    Hx of   History of kidney stones    Hypertension    Prostate cancer ([place])    Prostate cancer ([place])    Hx of    S/P colon resection    Shortness of breath dyspnea    Suprapubic catheter [facility])    Urinary retention     Patient Active Problem List   Diagnosis Date Noted   Benign hypertension 08/22/2017   Goals of care, counseling/discussion 08/22/2017   Dysuria 08/22/2017   Urinary retention 03/15/2017   Prostate cancer ([place]) 03/15/2017   Abscess 01/24/2016   Sepsis ([place]) 09/10/2015   Chest pain 09/10/2015   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   Clostridium difficile diarrhea    Colitis, infectious 04/23/2015   Incomplete bladder emptying 09/10/2014   Edema leg 09/15/2013   Pelvic and perineal pain 09/15/2013   Lower extremity venous stasis 09/15/2013   Pelvic pain in male 09/15/2013   Benign prostatic hyperplasia with urinary obstruction 07/22/2013    Past Surgical History:  Procedure Laterality Date   ABDOMINAL SURGERY     COLON SURGERY      CYSTOSCOPY WITH LITHOLAPAXY N/A 06/09/2017   Procedure: CYSTOSCOPY WITH LITHOLAPAXY;  Surgeon: Hollice Espy, MD;  Location: [facility] ORS;  Service: Urology;  Laterality: N/A;   PROSTATE SURGERY  05/25/2016   SUPRAPUBIC CATHETER INSERTION      Prior to Admission medications   Medication Sig Start Date End Date Taking? Authorizing Provider  abiraterone acetate (ZYTIGA) 250 MG tablet TAKE 4 TABLETS (1,000 MG TOTAL) BY MOUTH DAILY. TAKE ON AN EMPTY STOMACH 1 HOUR BEFORE OR 2 HOURS AFTER A MEAL. 10/26/18   Lloyd Huger, MD  furosemide (LASIX) 20 MG tablet TAKE ONE TABLET EVERY DAY AS NEEDED FOR SWELLING 11/18/18   Kendell Bane, NP  oxybutynin (DITROPAN) 5 MG tablet Take 1 tablet (5 mg total) by mouth 4 (four) times daily. 12/13/18   Zara Council A, PA-C  oxyCODONE (OXY IR/ROXICODONE) 5 MG immediate release tablet Take 1 tablet (5 mg total) by mouth every 6 (six) hours as needed for pain. 11/20/18   Duffy Bruce, MD  predniSONE (DELTASONE) 5 MG tablet TAKE 1 TABLET BY MOUTH ONCE DAILY WITH BREAKFAST 10/26/18   Lloyd Huger, MD    Allergies Patient has no known allergies.  Family History  Problem Relation Age of Onset   Hypertension Other    Alcohol abuse Mother    Heart attack Father    Prostate cancer Brother  Breast cancer Sister     Social History Social History   Tobacco Use   Smoking status: Current Every Day Smoker    Packs/day: 0.25    Years: 40.00    Pack years: 10.00    Types: Cigarettes   Smokeless tobacco: Never Used   Tobacco comment: 1pack a week  Substance Use Topics   Alcohol use: Yes   Drug use: No      Review of Systems Constitutional: No fever/chills Eyes: No visual changes. ENT: No sore throat. Cardiovascular: Denies chest pain. Respiratory: Denies shortness of breath. Gastrointestinal: Positive abdominal pain. no nausea, no vomiting.  No diarrhea.  No constipation. Genitourinary: Negative for dysuria.  Positive unable to  urinate Musculoskeletal: Negative for back pain. Skin: Negative for rash. Neurological: Negative for headaches, focal weakness or numbness. All other ROS negative ____________________________________________   PHYSICAL EXAM:  VITAL SIGNS: ED Triage Vitals  Enc Vitals Group     BP 12/25/18 2057 (!) 157/117     Pulse Rate 12/25/18 2057 (!) 109     Resp 12/25/18 2057 18     Temp 12/25/18 2057 99 F (37.2 C)     Temp Source 12/25/18 2057 Oral     SpO2 12/25/18 2057 96 %     Weight 12/25/18 2038 222 lb (100.7 kg)     Height 12/25/18 2038 5\' 11"  (1.803 m)     Head Circumference --      Peak Flow --      Pain Score 12/25/18 2038 10     Pain Loc --      Pain Edu? --      Excl. in [place]? --     Constitutional: Alert and oriented. Well appearing and in no acute distress. Eyes: Conjunctivae are normal. EOMI. Head: Atraumatic. Nose: No congestion/rhinnorhea. Mouth/Throat: Mucous membranes are moist.   Neck: No stridor. Trachea Midline. FROM Cardiovascular: Normal rate, regular rhythm. Grossly normal heart sounds.  Good peripheral circulation. Respiratory: Normal respiratory effort.  No retractions. Lungs CTAB. Gastrointestinal: Soft but full lower abdomen. No distention. No abdominal bruits.  Musculoskeletal: No lower extremity tenderness nor edema.  No joint effusions. Neurologic:  Normal speech and language. No gross focal neurologic deficits are appreciated.  Skin:  Skin is warm, dry and intact. No rash noted. Psychiatric: Mood and affect are normal. Speech and behavior are normal. GU: suprapubic catheter in place  ____________________________________________   LABS (all labs ordered are listed, but only abnormal results are displayed)  Labs Reviewed  CBC WITH DIFFERENTIAL/PLATELET - Abnormal; Notable for the following components:      Result Value   MCV 100.2 (*)    MCH 35.2 (*)    Platelets 122 (*)    All other components within normal limits  BASIC METABOLIC PANEL -  Abnormal; Notable for the following components:   Potassium 3.3 (*)    All other components within normal limits  URINALYSIS, COMPLETE (UACMP) WITH MICROSCOPIC - Abnormal; Notable for the following components:   Color, Urine AMBER (*)    APPearance CLOUDY (*)    Protein, ur 30 (*)    Bacteria, UA RARE (*)    All other components within normal limits    PROCEDURES  Procedure(s) performed (including Critical Care):  ASPIRATION BLADDER INSERT SUPRAPUBIC CATHETER  Date/Time: 12/25/2018 9:47 PM Performed by: Vanessa [place], MD Authorized by: Vanessa Felton, MD  Consent: The procedure was performed in an emergent situation. Consent given by: patient Patient understanding: patient states understanding of  the procedure being performed Patient consent: the patient's understanding of the procedure matches consent given Procedure consent: procedure consent matches procedure scheduled Relevant documents: relevant documents present and verified Patient identity confirmed: verbally with patient Local anesthesia used: yes Anesthesia method: urojet   Anesthesia: Local anesthesia used: yes Patient tolerance: patient tolerated the procedure well with no immediate complications Comments: No aspiration done but removed the prior suprapubic catheter and replaced new one       ____________________________________________   INITIAL IMPRESSION / ASSESSMENT AND PLAN / ED COURSE  Gerald Odonnell was evaluated in Emergency Department on 12/25/2018 for the symptoms described in the history of present illness. He was evaluated in the context of the global COVID-19 pandemic, which necessitated consideration that the patient might be at risk for infection with the SARS-CoV-2 virus that causes COVID-19. Institutional protocols and algorithms that pertain to the evaluation of patients at risk for COVID-19 are in a state of rapid change based on information released by regulatory bodies including the CDC and  federal and state organizations. These policies and algorithms were followed during the patient's care in the ED.    Patient is a 63 year old who presents with tachycardia and hypertension most likely secondary to pain from his urinary retention.  Patient had a suprapubic catheter and this was suppose to be changed a few days ago but was unable to be. This is most consistent with clogged suprapubic catheter.  Patient denies concern for UTI.  Will do labs to evaluate for AKI or electrolyte abnormalities.  I replaced suprapubic catheter and it went in easily.  Patient had urine that was coming out before even was able to put the catheter back in.  The catheter went in easily.  Patient had another 300 cc of urine out.  On reassessment patient feels much better.  He feels comfortable with DC home.  I discussed the provisional nature of ED diagnosis, the treatment so far, the ongoing plan of care, follow up appointments and return precautions with the patient and any family or support people present. They expressed understanding and agreed with the plan, discharged home.   ____________________________________________   FINAL CLINICAL IMPRESSION(S) / ED DIAGNOSES   Final diagnoses:  Urinary retention  Suprapubic catheter dysfunction, initial encounter ([place])      MEDICATIONS GIVEN DURING THIS VISIT:  Medications  lidocaine (XYLOCAINE) 2 % jelly 1 application (has no administration in time range)     ED Discharge Orders    None       Note:  This document was prepared using Dragon voice recognition software and may include unintentional dictation errors.   Vanessa Clarissa, MD 12/25/18 2351

## 2018-12-25 NOTE — Discharge Instructions (Addendum)
We replaced her catheter.  Your labs are reassuring.  Return to the ER for any fevers or any other concerns.

## 2018-12-25 NOTE — ED Notes (Signed)
Leg bag installed, pt given supplies for home

## 2018-12-25 NOTE — ED Notes (Signed)
Dr changed suprapubic catheter.

## 2018-12-25 NOTE — ED Triage Notes (Signed)
Patient states that he had a supra pubic catheter placed about a month ago. Patient states that it was suppose to be changed a month ago. Patient states that about an hour ago he started feeling a lot of pressure and he is not having any urine output.

## 2018-12-26 ENCOUNTER — Other Ambulatory Visit: Payer: Self-pay

## 2018-12-26 ENCOUNTER — Ambulatory Visit (INDEPENDENT_AMBULATORY_CARE_PROVIDER_SITE_OTHER): Payer: Medicare HMO | Admitting: Adult Health

## 2018-12-26 ENCOUNTER — Encounter: Payer: Self-pay | Admitting: Adult Health

## 2018-12-26 ENCOUNTER — Telehealth: Payer: Self-pay

## 2018-12-26 VITALS — BP 122/86 | HR 91 | Resp 16 | Ht 70.0 in | Wt 225.0 lb

## 2018-12-26 DIAGNOSIS — C61 Malignant neoplasm of prostate: Secondary | ICD-10-CM

## 2018-12-26 DIAGNOSIS — R609 Edema, unspecified: Secondary | ICD-10-CM

## 2018-12-26 DIAGNOSIS — R339 Retention of urine, unspecified: Secondary | ICD-10-CM | POA: Diagnosis not present

## 2018-12-26 DIAGNOSIS — Z0001 Encounter for general adult medical examination with abnormal findings: Secondary | ICD-10-CM | POA: Diagnosis not present

## 2018-12-26 DIAGNOSIS — I10 Essential (primary) hypertension: Secondary | ICD-10-CM | POA: Diagnosis not present

## 2018-12-26 DIAGNOSIS — F17200 Nicotine dependence, unspecified, uncomplicated: Secondary | ICD-10-CM

## 2018-12-26 DIAGNOSIS — C419 Malignant neoplasm of bone and articular cartilage, unspecified: Secondary | ICD-10-CM

## 2018-12-26 MED ORDER — OXYCODONE HCL 5 MG PO TABS: 5.0000 mg | ORAL_TABLET | Freq: Four times a day (QID) | ORAL | 0 refills | Status: DC | PRN

## 2018-12-26 MED ORDER — FUROSEMIDE 20 MG PO TABS: ORAL_TABLET | ORAL | 1 refills | Status: DC

## 2018-12-26 NOTE — Telephone Encounter (Signed)
called nick from [facility] that we did referral for home health

## 2018-12-26 NOTE — Progress Notes (Signed)
[facility] [place], [place] [address]  Internal MEDICINE  Office Visit Note  Patient Name: Gerald Odonnell  [id number]  [id number]  Date of Service: 12/26/2018  Chief Complaint  Patient presents with  . Medical Management of Chronic Issues  . Annual Exam    medicare well visit   . Hypertension  . Cancer     HPI Pt is here for routine health maintenance examination. He has a history of HTN, BPH, Prostate cancer, colon cancer that has metastasized. He has a suprapubic catheter.  He continues to take chemo orally.  Overall he is doing well, He did go to the hospital this morning because his catheter was clogged. He had home health nurses coming out monthly to change his catheter, but they have not been coming due to expired orders, per patient. He is currently smoking 1 pack of cigarettes weekly,  He averages 3 beers a day and denies illicit drug use.     Current Medication: Outpatient Encounter Medications as of 12/26/2018  Medication Sig  . abiraterone acetate (ZYTIGA) 250 MG tablet TAKE 4 TABLETS (1,000 MG TOTAL) BY MOUTH DAILY. TAKE ON AN EMPTY STOMACH 1 HOUR BEFORE OR 2 HOURS AFTER A MEAL.  . furosemide (LASIX) 20 MG tablet TAKE ONE TABLET EVERY DAY AS NEEDED FOR SWELLING  . oxybutynin (DITROPAN) 5 MG tablet Take 1 tablet (5 mg total) by mouth 4 (four) times daily.  . predniSONE (DELTASONE) 5 MG tablet TAKE 1 TABLET BY MOUTH ONCE DAILY WITH BREAKFAST  . oxyCODONE (OXY IR/ROXICODONE) 5 MG immediate release tablet Take 1 tablet (5 mg total) by mouth every 6 (six) hours as needed for pain. (Patient not taking: Reported on 12/26/2018)   No facility-administered encounter medications on file as of 12/26/2018.     Surgical History: Past Surgical History:  Procedure Laterality Date  . ABDOMINAL SURGERY    . COLON SURGERY    . CYSTOSCOPY WITH LITHOLAPAXY N/A 06/09/2017   Procedure: CYSTOSCOPY WITH LITHOLAPAXY;  Surgeon: Hollice Espy, MD;  Location: [facility]  ORS;  Service: Urology;  Laterality: N/A;  . PROSTATE SURGERY  05/25/2016  . SUPRAPUBIC CATHETER INSERTION      Medical History: Past Medical History:  Diagnosis Date  . Cancer ([place])    colon  . Cancer ([place])    bone cancer  . Colon cancer ([place])    Hx of  . History of kidney stones   . Hypertension   . Prostate cancer ([place])   . Prostate cancer ([place])    Hx of   . S/P colon resection   . Shortness of breath dyspnea   . Suprapubic catheter ([place])   . Urinary retention     Family History: Family History  Problem Relation Age of Onset  . Hypertension Other   . Alcohol abuse Mother   . Heart attack Father   . Prostate cancer Brother   . Breast cancer Sister       Review of Systems   Vital Signs: BP 122/86   Pulse 91   Resp 16   Ht 5\' 10"  (1.778 m)   Wt 225 lb (102.1 kg)   SpO2 97%   BMI 32.28 kg/m    Physical Exam   LABS: Recent Results (from the past 2160 hour(s))  Phosphorus     Status: None   Collection Time: 10/20/18  9:51 AM  Result Value Ref Range   Phosphorus 3.2 2.5 - 4.6 mg/dL    Comment: Performed at  [facility], [address] [place], [place] [address]  Magnesium     Status: None   Collection Time: 10/20/18  9:51 AM  Result Value Ref Range   Magnesium 2.1 1.7 - 2.4 mg/dL    Comment: Performed at [facility], [place], [place] [address]  PSA     Status: None   Collection Time: 10/20/18  9:51 AM  Result Value Ref Range   Prostatic Specific Antigen 2.40 0.00 - 4.00 ng/mL    Comment: (NOTE) While PSA levels of <=4.0 ng/ml are reported as reference range, some men with levels below 4.0 ng/ml can have prostate cancer and many men with PSA above 4.0 ng/ml do not have prostate cancer.  Other tests such as free PSA, age specific reference ranges, PSA velocity and PSA doubling time may be helpful especially in men less than 56 years old. Performed at [facility], [place] [address], [place],  [place] [address]   Comprehensive metabolic panel     Status: Abnormal   Collection Time: 10/20/18  9:51 AM  Result Value Ref Range   Sodium 138 135 - 145 mmol/L   Potassium 3.9 3.5 - 5.1 mmol/L   Chloride 104 98 - 111 mmol/L   CO2 26 22 - 32 mmol/L   Glucose, Bld 126 (H) 70 - 99 mg/dL   BUN 14 8 - 23 mg/dL   Creatinine, Ser 1.00 0.61 - 1.24 mg/dL   Calcium 9.4 8.9 - 10.3 mg/dL   Total Protein 7.7 6.5 - 8.1 g/dL   Albumin 4.2 3.5 - 5.0 g/dL   AST 33 15 - 41 U/L   ALT 27 0 - 44 U/L   Alkaline Phosphatase 90 38 - 126 U/L   Total Bilirubin 0.9 0.3 - 1.2 mg/dL   GFR calc non Af Amer >60 >60 mL/min   GFR calc Af Amer >60 >60 mL/min   Anion gap 8 5 - 15    Comment: Performed at [facility], [address] [place], [place] [address]  CBC with Differential/Platelet     Status: Abnormal   Collection Time: 10/20/18  9:51 AM  Result Value Ref Range   WBC 7.5 4.0 - 10.5 K/uL   RBC 4.30 4.22 - 5.81 MIL/uL   Hemoglobin 15.5 13.0 - 17.0 g/dL   HCT 44.4 39.0 - 52.0 %   MCV 103.3 (H) 80.0 - 100.0 fL   MCH 36.0 (H) 26.0 - 34.0 pg   MCHC 34.9 30.0 - 36.0 g/dL   RDW 12.2 11.5 - 15.5 %   Platelets 117 (L) 150 - 400 K/uL   nRBC 0.0 0.0 - 0.2 %   Neutrophils Relative % 75 %   Neutro Abs 5.7 1.7 - 7.7 K/uL   Lymphocytes Relative 15 %   Lymphs Abs 1.1 0.7 - 4.0 K/uL   Monocytes Relative 8 %   Monocytes Absolute 0.6 0.1 - 1.0 K/uL   Eosinophils Relative 1 %   Eosinophils Absolute 0.1 0.0 - 0.5 K/uL   Basophils Relative 1 %   Basophils Absolute 0.0 0.0 - 0.1 K/uL   Immature Granulocytes 0 %   Abs Immature Granulocytes 0.02 0.00 - 0.07 K/uL    Comment: Performed at [facility], [address] [place],  [address]  CBC     Status: Abnormal   Collection Time: 11/19/18  9:00 PM  Result Value Ref Range   WBC 12.5 (H) 4.0 - 10.5 K/uL   RBC  4.48 4.22 - 5.81 MIL/uL   Hemoglobin 15.9 13.0 - 17.0 g/dL   HCT 44.6 39.0 - 52.0 %   MCV 99.6 80.0 - 100.0 fL   MCH 35.5 (H) 26.0 - 34.0  pg   MCHC 35.7 30.0 - 36.0 g/dL   RDW 12.1 11.5 - 15.5 %   Platelets 166 150 - 400 K/uL   nRBC 0.0 0.0 - 0.2 %    Comment: Performed at [facility], [address] [place], [place] [address]  Comprehensive metabolic panel     Status: Abnormal   Collection Time: 11/19/18  9:00 PM  Result Value Ref Range   Sodium 135 135 - 145 mmol/L   Potassium 3.6 3.5 - 5.1 mmol/L   Chloride 102 98 - 111 mmol/L   CO2 21 (L) 22 - 32 mmol/L   Glucose, Bld 106 (H) 70 - 99 mg/dL   BUN 15 8 - 23 mg/dL   Creatinine, Ser 0.76 0.61 - 1.24 mg/dL   Calcium 9.4 8.9 - 10.3 mg/dL   Total Protein 7.9 6.5 - 8.1 g/dL   Albumin 4.3 3.5 - 5.0 g/dL   AST 32 15 - 41 U/L   ALT 22 0 - 44 U/L   Alkaline Phosphatase 99 38 - 126 U/L   Total Bilirubin 0.8 0.3 - 1.2 mg/dL   GFR calc non Af Amer >60 >60 mL/min   GFR calc Af Amer >60 >60 mL/min   Anion gap 12 5 - 15    Comment: Performed at [facility], [address] [place], [place] [address]  Urinalysis, Complete w Microscopic     Status: Abnormal   Collection Time: 11/20/18 12:26 AM  Result Value Ref Range   Color, Urine YELLOW (A) YELLOW   APPearance HAZY (A) CLEAR   Specific Gravity, Urine 1.010 1.005 - 1.030   pH 6.0 5.0 - 8.0   Glucose, UA NEGATIVE NEGATIVE mg/dL   Hgb urine dipstick MODERATE (A) NEGATIVE   Bilirubin Urine NEGATIVE NEGATIVE   Ketones, ur NEGATIVE NEGATIVE mg/dL   Protein, ur NEGATIVE NEGATIVE mg/dL   Nitrite POSITIVE (A) NEGATIVE   Leukocytes,Ua SMALL (A) NEGATIVE   RBC / HPF 11-20 0 - 5 RBC/hpf   WBC, UA 21-50 0 - 5 WBC/hpf   Bacteria, UA MANY (A) NONE SEEN   Squamous Epithelial / LPF NONE SEEN 0 - 5   Hyaline Casts, UA PRESENT     Comment: Performed at [facility], [address], [place], [place] [address]  Urine culture     Status: Abnormal   Collection Time: 11/20/18 12:26 AM   Specimen: Urine, Random  Result Value Ref Range   Specimen Description      URINE, RANDOM Performed at [facility] [address], [place], [place] [address]    Special Requests      NONE Performed at [facility], [address] [place], [place] [address]    Culture MULTIPLE SPECIES PRESENT, SUGGEST RECOLLECTION (A)    Report Status 11/21/2018 FINAL   Phosphorus     Status: None   Collection Time: 12/07/18  9:49 AM  Result Value Ref Range   Phosphorus 3.8 2.5 - 4.6 mg/dL    Comment: Performed at [facility], [address] [place], [place] [address]  Magnesium     Status: None   Collection Time: 12/07/18  9:49 AM  Result Value Ref Range   Magnesium 2.1 1.7 - 2.4 mg/dL  Comment: Performed at [facility], [address] [place], [place] [address]  PSA     Status: None   Collection Time: 12/07/18  9:49 AM  Result Value Ref Range   Prostatic Specific Antigen 2.82 0.00 - 4.00 ng/mL    Comment: (NOTE) While PSA levels of <=4.0 ng/ml are reported as reference range, some men with levels below 4.0 ng/ml can have prostate cancer and many men with PSA above 4.0 ng/ml do not have prostate cancer.  Other tests such as free PSA, age specific reference ranges, PSA velocity and PSA doubling time may be helpful especially in men less than 63 years old. Performed at [facility], [place] [address], [place], [place] [address]   Comprehensive metabolic panel     Status: Abnormal   Collection Time: 12/07/18  9:49 AM  Result Value Ref Range   Sodium 139 135 - 145 mmol/L   Potassium 4.1 3.5 - 5.1 mmol/L   Chloride 104 98 - 111 mmol/L   CO2 25 22 - 32 mmol/L   Glucose, Bld 119 (H) 70 - 99 mg/dL   BUN 11 8 - 23 mg/dL   Creatinine, Ser 0.67 0.61 - 1.24 mg/dL   Calcium 9.4 8.9 - 10.3 mg/dL   Total Protein 7.7 6.5 - 8.1 g/dL   Albumin 4.0 3.5 - 5.0 g/dL   AST 22 15 - 41 U/L   ALT 15 0 - 44 U/L   Alkaline Phosphatase 102 38 - 126 U/L   Total Bilirubin 1.2 0.3 - 1.2 mg/dL   GFR calc non Af Amer >60 >60 mL/min   GFR calc Af Amer >60 >60 mL/min   Anion gap  10 5 - 15    Comment: Performed at [facility], [address] [place], [place] [address]  CBC with Differential/Platelet     Status: Abnormal   Collection Time: 12/07/18  9:49 AM  Result Value Ref Range   WBC 7.3 4.0 - 10.5 K/uL   RBC 4.28 4.22 - 5.81 MIL/uL   Hemoglobin 15.3 13.0 - 17.0 g/dL   HCT 42.9 39.0 - 52.0 %   MCV 100.2 (H) 80.0 - 100.0 fL   MCH 35.7 (H) 26.0 - 34.0 pg   MCHC 35.7 30.0 - 36.0 g/dL   RDW 12.5 11.5 - 15.5 %   Platelets 120 (L) 150 - 400 K/uL   nRBC 0.0 0.0 - 0.2 %   Neutrophils Relative % 74 %   Neutro Abs 5.5 1.7 - 7.7 K/uL   Lymphocytes Relative 15 %   Lymphs Abs 1.1 0.7 - 4.0 K/uL   Monocytes Relative 8 %   Monocytes Absolute 0.6 0.1 - 1.0 K/uL   Eosinophils Relative 1 %   Eosinophils Absolute 0.0 0.0 - 0.5 K/uL   Basophils Relative 1 %   Basophils Absolute 0.1 0.0 - 0.1 K/uL   Immature Granulocytes 1 %   Abs Immature Granulocytes 0.04 0.00 - 0.07 K/uL    Comment: Performed at [facility], [address] [place], [place] [address]  Urinalysis, Complete w Microscopic     Status: Abnormal   Collection Time: 12/25/18  8:59 PM  Result Value Ref Range   Color, Urine AMBER (A) YELLOW    Comment: BIOCHEMICALS MAY BE AFFECTED BY COLOR   APPearance CLOUDY (A) CLEAR   Specific Gravity, Urine 1.015 1.005 - 1.030   pH 8.0 5.0 - 8.0   Glucose, UA NEGATIVE NEGATIVE mg/dL   Hgb urine dipstick  NEGATIVE NEGATIVE   Bilirubin Urine NEGATIVE NEGATIVE   Ketones, ur NEGATIVE NEGATIVE mg/dL   Protein, ur 30 (A) NEGATIVE mg/dL   Nitrite NEGATIVE NEGATIVE   Leukocytes,Ua NEGATIVE NEGATIVE   RBC / HPF 0-5 0 - 5 RBC/hpf   WBC, UA 0-5 0 - 5 WBC/hpf   Bacteria, UA RARE (A) NONE SEEN   Squamous Epithelial / LPF NONE SEEN 0 - 5    Comment: Performed at [facility], [address] [place], [place] [address]  CBC with Differential     Status: Abnormal   Collection Time: 12/25/18 10:02 PM  Result Value Ref Range   WBC 8.0 4.0 - 10.5 K/uL    RBC 4.26 4.22 - 5.81 MIL/uL   Hemoglobin 15.0 13.0 - 17.0 g/dL   HCT 42.7 39.0 - 52.0 %   MCV 100.2 (H) 80.0 - 100.0 fL   MCH 35.2 (H) 26.0 - 34.0 pg   MCHC 35.1 30.0 - 36.0 g/dL   RDW 12.4 11.5 - 15.5 %   Platelets 122 (L) 150 - 400 K/uL    Comment: Immature Platelet Fraction may be clinically indicated, consider ordering this additional test [id number]    nRBC 0.0 0.0 - 0.2 %   Neutrophils Relative % 77 %   Neutro Abs 6.1 1.7 - 7.7 K/uL   Lymphocytes Relative 14 %   Lymphs Abs 1.1 0.7 - 4.0 K/uL   Monocytes Relative 7 %   Monocytes Absolute 0.6 0.1 - 1.0 K/uL   Eosinophils Relative 1 %   Eosinophils Absolute 0.1 0.0 - 0.5 K/uL   Basophils Relative 1 %   Basophils Absolute 0.1 0.0 - 0.1 K/uL   Immature Granulocytes 0 %   Abs Immature Granulocytes 0.03 0.00 - 0.07 K/uL    Comment: Performed at [facility], [address] [place], [place] [id number]  Basic metabolic panel     Status: Abnormal   Collection Time: 12/25/18 10:02 PM  Result Value Ref Range   Sodium 137 135 - 145 mmol/L   Potassium 3.3 (L) 3.5 - 5.1 mmol/L   Chloride 104 98 - 111 mmol/L   CO2 25 22 - 32 mmol/L   Glucose, Bld 97 70 - 99 mg/dL   BUN 16 8 - 23 mg/dL   Creatinine, Ser 0.79 0.61 - 1.24 mg/dL   Calcium 9.3 8.9 - 10.3 mg/dL   GFR calc non Af Amer >60 >60 mL/min   GFR calc Af Amer >60 >60 mL/min   Anion gap 8 5 - 15    Comment: Performed at [facility], [address] [place], [place] [address]     Assessment/Plan: 1. Encounter for general adult medical examination with abnormal findings Will get lipid panel and TSH/FT4 - Lipid Panel With LDL/HDL Ratio - TSH + free T4  2. Benign hypertension Well controlled, continue current therapy.  3. Prostate cancer ([place]) Continue to see oncology.   4. Incomplete bladder emptying Suprapubic catheter in place.  5. Edema, unspecified type Continue Lasix as discussed.  - furosemide (LASIX) 20 MG tablet; TAKE ONE TABLET EVERY DAY  AS NEEDED FOR SWELLING  Dispense: 30 tablet; Refill: 1  6. Malignant neoplasm of bone, unspecified location [facility]) Reviewed risks and possible side effects associated with taking opiates, benzodiazepines and other CNS depressants. Combination of these could cause dizziness and drowsiness. Advised patient not to drive or operate machinery when taking these medications, as patient's and other's life can be at risk and will have consequences.  Patient verbalized understanding in this matter. Dependence and abuse for these drugs will be monitored closely. A Controlled substance policy and procedure is on file which allows [facility] to order a urine drug screen test at any visit. Patient understands and agrees with the plan  Informed patient that he would have to seen pain mgmt beyond this RX, or speak to oncology for further pain relief.  - oxyCODONE (OXY IR/ROXICODONE) 5 MG immediate release tablet; Take 1 tablet (5 mg total) by mouth every 6 (six) hours as needed.  Dispense: 8 tablet; Refill: 0  7. Nicotine dependence with current use Smoking cessation counseling: 1. Pt acknowledges the risks of long term smoking, she will try to quite smoking. 2. Options for different medications including nicotine products, chewing gum, patch etc, Wellbutrin and Chantix is discussed 3. Goal and date of compete cessation is discussed 4. Total time spent in smoking cessation is 15 min.   General Counseling: alexus kaczanowski understanding of the findings of todays visit and agrees with plan of treatment. I have discussed any further diagnostic evaluation that may be needed or ordered today. We also reviewed his medications today. he has been encouraged to call the office with any questions or concerns that should arise related to todays visit.   No orders of the defined types were placed in this encounter.   No orders of the defined types were placed in this encounter.   Time spent: 30  Minutes   This patient was seen by Orson Gear AGNP-C in Collaboration with Dr Lavera Guise as a part of collaborative care agreement    Kendell Bane AGNP-C Internal Medicine

## 2018-12-30 NOTE — Progress Notes (Signed)
[place]  [phone] [phone]  ID: Gerald Odonnell OB: 05-25-1955  MR#: [id number]  [id number]  Patient Care Team: Lavera Guise, MD as PCP - General (Internal Medicine) Ronnell Freshwater, NP (Family Medicine)  CHIEF COMPLAINT: Prostate cancer metastatic to bone  INTERVAL HISTORY: Patient returns to clinic today for repeat laboratory work, further evaluation, and continuation of Zytiga and prednisone.  Patient states he has a poor appetite, but is unclear of any weight loss.  He otherwise feels well and is asymptomatic.  He is tolerating his treatments without significant side effects. He continues to have urinary retention and has a suprapubic tube.  He has no neurologic complaints.  He denies any recent fevers or illnesses. He denies any pain.  He denies any chest pain, shortness of breath, cough, or hemoptysis.  He denies any nausea, vomiting, constipation, or diarrhea.  Patient offers no further specific complaints today.  REVIEW OF SYSTEMS:   Review of Systems  Constitutional: Negative.  Negative for fever, malaise/fatigue and weight loss.  Respiratory: Negative.  Negative for cough, hemoptysis and shortness of breath.   Cardiovascular: Negative.  Negative for chest pain and leg swelling.  Gastrointestinal: Negative.  Negative for abdominal pain, blood in stool and melena.  Genitourinary: Negative.  Negative for dysuria and hematuria.  Musculoskeletal: Negative.  Negative for back pain.  Skin: Negative.  Negative for rash.  Neurological: Negative.  Negative for focal weakness, weakness and headaches.  Psychiatric/Behavioral: Negative.  The patient is not nervous/anxious.     As per HPI. Otherwise, a complete review of systems is negative.  PAST MEDICAL HISTORY: Past Medical History:  Diagnosis Date  . Cancer ([place])    colon  . Cancer ([place])    bone cancer  . Colon cancer ([place])    Hx of  . History of kidney stones   .  Hypertension   . Prostate cancer ([place])   . Prostate cancer ([place])    Hx of   . S/P colon resection   . Shortness of breath dyspnea   . Suprapubic catheter ([place])   . Urinary retention     PAST SURGICAL HISTORY: Past Surgical History:  Procedure Laterality Date  . ABDOMINAL SURGERY    . COLON SURGERY    . CYSTOSCOPY WITH LITHOLAPAXY N/A 06/09/2017   Procedure: CYSTOSCOPY WITH LITHOLAPAXY;  Surgeon: Hollice Espy, MD;  Location: [facility] ORS;  Service: Urology;  Laterality: N/A;  . PROSTATE SURGERY  05/25/2016  . SUPRAPUBIC CATHETER INSERTION      FAMILY HISTORY: Family History  Problem Relation Age of Onset  . Hypertension Other   . Alcohol abuse Mother   . Heart attack Father   . Prostate cancer Brother   . Breast cancer Sister     ADVANCED DIRECTIVES (Y/N):  N  HEALTH MAINTENANCE: Social History   Tobacco Use  . Smoking status: Current Every Day Smoker    Packs/day: 0.25    Years: 40.00    Pack years: 10.00    Types: Cigarettes  . Smokeless tobacco: Never Used  . Tobacco comment: 1pack a week  Substance Use Topics  . Alcohol use: Yes  . Drug use: No     Colonoscopy:  PAP:  Bone density:  Lipid panel:  No Known Allergies  Current Outpatient Medications  Medication Sig Dispense Refill  . abiraterone acetate (ZYTIGA) 250 MG tablet TAKE 4 TABLETS (1,000 MG TOTAL) BY MOUTH DAILY. TAKE ON AN EMPTY STOMACH 1 HOUR BEFORE OR 2  HOURS AFTER A MEAL. 120 tablet 3  . furosemide (LASIX) 20 MG tablet TAKE ONE TABLET EVERY DAY AS NEEDED FOR SWELLING 30 tablet 1  . oxybutynin (DITROPAN) 5 MG tablet Take 1 tablet (5 mg total) by mouth 4 (four) times daily. 120 tablet 6  . predniSONE (DELTASONE) 5 MG tablet TAKE 1 TABLET BY MOUTH ONCE DAILY WITH BREAKFAST 30 tablet 3  . megestrol (MEGACE) 40 MG tablet Take 1 tablet (40 mg total) by mouth daily. 30 tablet 0  . oxyCODONE (OXY IR/ROXICODONE) 5 MG immediate release tablet Take 1 tablet (5 mg total) by mouth every 6 (six) hours as  needed. (Patient not taking: Reported on 01/11/2019) 8 tablet 0   No current facility-administered medications for this visit.     OBJECTIVE: Vitals:   01/12/19 0928  BP: (!) 133/91  Pulse: 91  Temp: 98.5 F (36.9 C)     Body mass index is 32.28 kg/m.    ECOG FS:0 - Asymptomatic   General: Well-developed, well-nourished, no acute distress. Eyes: Pink conjunctiva, anicteric sclera. HEENT: Normocephalic, moist mucous membranes. Lungs: Clear to auscultation bilaterally. Heart: Regular rate and rhythm. No rubs, murmurs, or gallops. Abdomen: Soft, nontender, nondistended. No organomegaly noted, normoactive bowel sounds. Musculoskeletal: No edema, cyanosis, or clubbing. Neuro: Alert, answering all questions appropriately. Cranial nerves grossly intact. Skin: No rashes or petechiae noted. Psych: Normal affect.  LAB RESULTS:  Lab Results  Component Value Date   NA 140 01/12/2019   K 4.2 01/12/2019   CL 108 01/12/2019   CO2 25 01/12/2019   GLUCOSE 105 (H) 01/12/2019   BUN 10 01/12/2019   CREATININE 0.61 01/12/2019   CALCIUM 9.1 01/12/2019   PROT 7.5 01/12/2019   ALBUMIN 4.0 01/12/2019   AST 25 01/12/2019   ALT 17 01/12/2019   ALKPHOS 101 01/12/2019   BILITOT 1.1 01/12/2019   GFRNONAA >60 01/12/2019   GFRAA >60 01/12/2019    Lab Results  Component Value Date   WBC 6.8 01/12/2019   NEUTROABS 5.0 01/12/2019   HGB 14.8 01/12/2019   HCT 41.9 01/12/2019   MCV 101.2 (H) 01/12/2019   PLT 106 (L) 01/12/2019     STUDIES: No results found.  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 In 2017 upon diagnosis, he reportedly received XRT along with brachii therapy.  He last received Lupron in September 2018.  Patient states he had allergic  reaction to Lupron and refuses to take any further injections.  He also was given a prescription for Casodex by urology, but has refused to take this as well.  He states he will never take any chemotherapy, but reluctantly agreed to initiate Zytiga plus prednisone.  Typically in castrate sensitive disease should be treated with both Zytiga and Lupron, but patient is refusing injections as above.  Patient is tolerating his treatments well without significant side effects.  His PSA continues to trend down is now 2.63.  Continue Zytiga and prednisone indefinitely.  Return to clinic in 3 months with repeat laboratory can further evaluation. 2.  Suprapubic catheter: Patient states he has home health to manage it.  He does not want to return to urology at this time. 3.  Poor appetite: Patient was given a prescription for Megace today.  I spent a  total of 30 minutes face-to-face with the patient of which greater than 50% of the visit was spent in counseling and coordination of care as detailed above.   Patient expressed understanding and was in agreement with this plan. He also understands that He can call clinic at any time with any questions, concerns, or complaints.   Cancer Staging Prostate cancer [facility]) Staging form: Prostate, AJCC 8th Edition - Clinical stage from 03/04/2018: Stage IVB (cT3a, cN0, cM1b, Grade Group: 5) - Signed by Lloyd Huger, MD on 03/04/2018   Lloyd Huger, MD   01/12/2019 4:16 PM

## 2019-01-11 ENCOUNTER — Other Ambulatory Visit: Payer: Self-pay

## 2019-01-11 NOTE — Progress Notes (Signed)
Pre-visit assessment for [place] clinic appointment on 01/12/2019 with Dr. Grayland Ormond.  Pt concerns are as follows:  1. He c/o 8/10 constant bone pain. He has been out of his pain medication for a while and has been unable to get his PCP to refill this. He requests that Dr. Grayland Ormond refill his Rx.  2. He reports a very poor appetite - ~ 25% of normal. He lives alone and does not have a way to get nutritional supplements. He requests samples if available.  3. He mentions severe orthopnea which requires him to sleep in a recliner.

## 2019-01-12 ENCOUNTER — Other Ambulatory Visit: Payer: Self-pay

## 2019-01-12 ENCOUNTER — Other Ambulatory Visit: Payer: Self-pay | Admitting: *Deleted

## 2019-01-12 ENCOUNTER — Inpatient Hospital Stay: Payer: Medicare HMO | Attending: Oncology

## 2019-01-12 ENCOUNTER — Inpatient Hospital Stay (HOSPITAL_BASED_OUTPATIENT_CLINIC_OR_DEPARTMENT_OTHER): Payer: Medicare HMO | Admitting: Oncology

## 2019-01-12 ENCOUNTER — Encounter: Payer: Self-pay | Admitting: Oncology

## 2019-01-12 VITALS — BP 133/91 | HR 91 | Temp 98.5°F | Wt 225.0 lb

## 2019-01-12 DIAGNOSIS — C61 Malignant neoplasm of prostate: Secondary | ICD-10-CM | POA: Insufficient documentation

## 2019-01-12 DIAGNOSIS — Z803 Family history of malignant neoplasm of breast: Secondary | ICD-10-CM | POA: Insufficient documentation

## 2019-01-12 DIAGNOSIS — C7951 Secondary malignant neoplasm of bone: Secondary | ICD-10-CM | POA: Diagnosis not present

## 2019-01-12 DIAGNOSIS — Z85038 Personal history of other malignant neoplasm of large intestine: Secondary | ICD-10-CM | POA: Insufficient documentation

## 2019-01-12 DIAGNOSIS — F17210 Nicotine dependence, cigarettes, uncomplicated: Secondary | ICD-10-CM | POA: Diagnosis not present

## 2019-01-12 LAB — COMPREHENSIVE METABOLIC PANEL
ALT: 17 U/L (ref 0–44)
AST: 25 U/L (ref 15–41)
Albumin: 4 g/dL (ref 3.5–5.0)
Alkaline Phosphatase: 101 U/L (ref 38–126)
Anion gap: 7 (ref 5–15)
BUN: 10 mg/dL (ref 8–23)
CO2: 25 mmol/L (ref 22–32)
Calcium: 9.1 mg/dL (ref 8.9–10.3)
Chloride: 108 mmol/L (ref 98–111)
Creatinine, Ser: 0.61 mg/dL (ref 0.61–1.24)
GFR calc Af Amer: >60 mL/min (ref >60)
GFR calc non Af Amer: >60 mL/min (ref >60)
Glucose, Bld: 105 mg/dL — ABNORMAL HIGH (ref 70–99)
Potassium: 4.2 mmol/L (ref 3.5–5.1)
Sodium: 140 mmol/L (ref 135–145)
Total Bilirubin: 1.1 mg/dL (ref 0.3–1.2)
Total Protein: 7.5 g/dL (ref 6.5–8.1)

## 2019-01-12 LAB — CBC WITH DIFFERENTIAL/PLATELET
Abs Immature Granulocytes: 0.01 10*3/uL (ref 0.00–0.07)
Basophils Absolute: 0 10*3/uL (ref 0.0–0.1)
Basophils Relative: 1 %
Eosinophils Absolute: 0 10*3/uL (ref 0.0–0.5)
Eosinophils Relative: 1 %
HCT: 41.9 % (ref 39.0–52.0)
Hemoglobin: 14.8 g/dL (ref 13.0–17.0)
Immature Granulocytes: 0 %
Lymphocytes Relative: 17 %
Lymphs Abs: 1.2 10*3/uL (ref 0.7–4.0)
MCH: 35.7 pg — ABNORMAL HIGH (ref 26.0–34.0)
MCHC: 35.3 g/dL (ref 30.0–36.0)
MCV: 101.2 fL — ABNORMAL HIGH (ref 80.0–100.0)
Monocytes Absolute: 0.6 10*3/uL (ref 0.1–1.0)
Monocytes Relative: 8 %
Neutro Abs: 5 10*3/uL (ref 1.7–7.7)
Neutrophils Relative %: 73 %
Platelets: 106 10*3/uL — ABNORMAL LOW (ref 150–400)
RBC: 4.14 MIL/uL — ABNORMAL LOW (ref 4.22–5.81)
RDW: 13.1 % (ref 11.5–15.5)
WBC: 6.8 10*3/uL (ref 4.0–10.5)
nRBC: 0 % (ref 0.0–0.2)

## 2019-01-12 LAB — PSA: Prostatic Specific Antigen: 2.63 ng/mL (ref 0.00–4.00)

## 2019-01-12 LAB — PHOSPHORUS: Phosphorus: 3.3 mg/dL (ref 2.5–4.6)

## 2019-01-12 LAB — MAGNESIUM: Magnesium: 2.1 mg/dL (ref 1.7–2.4)

## 2019-01-12 MED ORDER — MEGESTROL ACETATE 40 MG PO TABS: 40.0000 mg | ORAL_TABLET | Freq: Every day | ORAL | 0 refills | Status: DC

## 2019-01-12 NOTE — Telephone Encounter (Signed)
Rx sent to pharmacy   

## 2019-01-12 NOTE — Telephone Encounter (Signed)
Patient called stating doctor was to call in something "to make me eat", but is not at [facility] yet. Please advise

## 2019-01-12 NOTE — Telephone Encounter (Signed)
Megace 40mg  tab daily. Haven't had a chance to put it in yet.

## 2019-01-16 MED FILL — ABIRATERONE ACETATE 250 MG: 250 | 30 days supply | Qty: 120 | Fill #2

## 2019-01-25 ENCOUNTER — Telehealth: Payer: Self-pay | Admitting: Urology

## 2019-01-25 NOTE — Telephone Encounter (Signed)
[name] from [facility] and needs a call back concerning foley cath and numbing cream for pt. He would like a bigger size cath.  Ph # [phone]

## 2019-01-26 NOTE — Telephone Encounter (Signed)
Are we talking about his SPT?  I would recommend that it remain 16 Fr and that he start flushing it as needed.  In the past, he has capped this which is not recommended.     Hollice Espy, MD

## 2019-01-26 NOTE — Telephone Encounter (Addendum)
Spoke with [name], informed recommendations. Verbalized understanding.

## 2019-01-26 NOTE — Telephone Encounter (Signed)
Spoke with [name] concerning Gerald Odonnell concerns. She sates he feels like he needs a bigger catheter due to increased sediment. He refuses to come to office per [name].

## 2019-01-31 ENCOUNTER — Telehealth: Payer: Self-pay

## 2019-01-31 NOTE — Telephone Encounter (Signed)
[facility] nurse [name] called to get verbal okay to do a u/a for pt. Pt reported that he had a spout odor in his catheter and had cloudy urine, gave verbal ok to do u/a. Call back number is [phone].And also pt advised he need see urology for catheter

## 2019-02-01 ENCOUNTER — Telehealth: Payer: Self-pay | Admitting: Urology

## 2019-02-01 NOTE — Telephone Encounter (Signed)
He was supposed to have a cysto scheduled in May-but cancelled.

## 2019-02-01 NOTE — Telephone Encounter (Signed)
Please schedule him for routine follow-up with either Larene Beach or Sam.

## 2019-02-01 NOTE — Telephone Encounter (Signed)
Pt called and states that he needs an appt to continue getting home health nursing care. I didn't see any note pertaining to that.Please advise if pt needs an appt.

## 2019-02-06 MED FILL — predniSONE 5 MG TABS: 5 | 30 days supply | Qty: 30 | Fill #3

## 2019-02-08 NOTE — Telephone Encounter (Signed)
Appointment made. Patient aware. 

## 2019-02-09 ENCOUNTER — Other Ambulatory Visit: Payer: Self-pay

## 2019-02-09 ENCOUNTER — Encounter: Payer: Self-pay | Admitting: Adult Health

## 2019-02-09 MED ORDER — AMOXICILLIN-POT CLAVULANATE 875-125 MG PO TABS: 1.0000 | ORAL_TABLET | Freq: Two times a day (BID) | ORAL | 0 refills | Status: AC

## 2019-02-09 MED FILL — ABIRATERONE ACETATE 250 MG: 250 | 30 days supply | Qty: 120 | Fill #3

## 2019-02-09 NOTE — Telephone Encounter (Signed)
Spoke with pt urine showed infection as per adam we send augmentin for 10 days pt need keep urologist appt coming up Monday we going to scan urine result in epic

## 2019-02-13 ENCOUNTER — Encounter: Payer: Self-pay | Admitting: Physician Assistant

## 2019-02-13 ENCOUNTER — Other Ambulatory Visit: Payer: Self-pay

## 2019-02-13 ENCOUNTER — Ambulatory Visit (INDEPENDENT_AMBULATORY_CARE_PROVIDER_SITE_OTHER): Payer: Medicare HMO | Admitting: Physician Assistant

## 2019-02-13 VITALS — BP 122/76 | HR 102 | Ht 70.0 in | Wt 228.0 lb

## 2019-02-13 DIAGNOSIS — R8290 Unspecified abnormal findings in urine: Secondary | ICD-10-CM | POA: Diagnosis not present

## 2019-02-13 DIAGNOSIS — R8279 Other abnormal findings on microbiological examination of urine: Secondary | ICD-10-CM | POA: Diagnosis not present

## 2019-02-13 DIAGNOSIS — N319 Neuromuscular dysfunction of bladder, unspecified: Secondary | ICD-10-CM | POA: Diagnosis not present

## 2019-02-13 NOTE — Progress Notes (Signed)
02/13/2019 12:58 PM   Gerald Odonnell 1956/02/18 [id number]  CC: Follow up neurogenic bladder with SPT, metastatic prostate cancer  HPI: Gerald Odonnell is a 63 y.o. male with PMH metastatic prostate cancer and neurogenic bladder managed with SPT previously managed by Zara Council and Dr. Erlene Quan. He is here for routine follow up.  His cancer is being managed by Dr. Grayland Ormond. Most recent appointment on 01/12/2019 noted a plan for him to continue both Zytiga and prednisone indefinitely. PSA on that date was 2.63.  He is seen by home health as needed, who manage his monthly SP tube changes. He has been taking oxybutynin 5 mg QID for bladder spasms associated with this. He does report multiple episodes of catheter occlusion requiring early catheter exchanges and trips to the ED (in July and August 2020) with urinary retention. He flushes his catheter once daily with sterile saline.  He is currently being treated for a positive urine culture (>100k CFUs/mL GNRs) with 10 days of Augmentin by Orson Gear, NP. Culture was obtained by home health due to urinary odor and cloudiness. He denies fever, chills, and suprapubic pain associated with this positive culture.  He is also past due for cystoscopy, having refused this with Dr. Erlene Quan in May 2020. He reports this was due to concern for pain associated with the procedure.  PMH: Past Medical History:  Diagnosis Date  . Cancer ([address])    colon  . Cancer ([place])    bone cancer  . Colon cancer ([place])    Hx of  . History of kidney stones   . Hypertension   . Prostate cancer ([place])   . Prostate cancer ([place])    Hx of   . S/P colon resection   . Shortness of breath dyspnea   . Suprapubic catheter ([place])   . Urinary retention     Surgical History: Past Surgical History:  Procedure Laterality Date  . ABDOMINAL SURGERY    . COLON SURGERY    . CYSTOSCOPY WITH LITHOLAPAXY N/A 06/09/2017   Procedure: CYSTOSCOPY WITH LITHOLAPAXY;  Surgeon:  Hollice Espy, MD;  Location: [facility] ORS;  Service: Urology;  Laterality: N/A;  . PROSTATE SURGERY  05/25/2016  . SUPRAPUBIC CATHETER INSERTION      Home Medications:  Allergies as of 02/13/2019   No Known Allergies     Medication List       Accurate as of February 13, 2019 11:59 PM. If you have any questions, ask your nurse or doctor.        abiraterone acetate 250 MG tablet Commonly known as: ZYTIGA TAKE 4 TABLETS (1,000 MG TOTAL) BY MOUTH DAILY. TAKE ON AN EMPTY STOMACH 1 HOUR BEFORE OR 2 HOURS AFTER A MEAL.   amoxicillin-clavulanate 875-125 MG tablet Commonly known as: AUGMENTIN Take 1 tablet by mouth 2 (two) times daily for 10 days.   furosemide 20 MG tablet Commonly known as: LASIX TAKE ONE TABLET EVERY DAY AS NEEDED FOR SWELLING   megestrol 40 MG tablet Commonly known as: MEGACE Take 1 tablet (40 mg total) by mouth daily.   oxybutynin 5 MG tablet Commonly known as: DITROPAN Take 1 tablet (5 mg total) by mouth 4 (four) times daily.   oxyCODONE 5 MG immediate release tablet Commonly known as: Oxy IR/ROXICODONE Take 1 tablet (5 mg total) by mouth every 6 (six) hours as needed.   predniSONE 5 MG tablet Commonly known as: DELTASONE TAKE 1 TABLET BY MOUTH ONCE DAILY WITH BREAKFAST  Allergies:  No Known Allergies  Family History: Family History  Problem Relation Age of Onset  . Hypertension Other   . Alcohol abuse Mother   . Heart attack Father   . Prostate cancer Brother   . Breast cancer Sister     Social History:   reports that he has been smoking cigarettes. He has a 10.00 pack-year smoking history. He has never used smokeless tobacco. He reports current alcohol use. He reports that he does not use drugs.  ROS: UROLOGY Frequent Urination?: No Hard to postpone urination?: No Burning/pain with urination?: No Get up at night to urinate?: No Leakage of urine?: No Urine stream starts and stops?: No Trouble starting stream?: No Do you have to  strain to urinate?: No Blood in urine?: No Urinary tract infection?: No Sexually transmitted disease?: No Injury to kidneys or bladder?: No Painful intercourse?: No Weak stream?: No Erection problems?: No Penile pain?: No  Gastrointestinal Nausea?: No Vomiting?: No Indigestion/heartburn?: No Diarrhea?: No Constipation?: No  Constitutional Fever: No Night sweats?: No Weight loss?: No Fatigue?: No  Skin Skin rash/lesions?: No Itching?: No  Eyes Blurred vision?: No Double vision?: No  Ears/Nose/Throat Sore throat?: No Sinus problems?: No  Hematologic/Lymphatic Swollen glands?: No Easy bruising?: No  Cardiovascular Leg swelling?: No Chest pain?: Yes  Respiratory Cough?: Yes Shortness of breath?: No  Endocrine Excessive thirst?: Yes  Musculoskeletal Back pain?: Yes Joint pain?: Yes  Neurological Headaches?: No Dizziness?: No  Psychologic Depression?: No Anxiety?: No  Physical Exam: BP 122/76   Pulse (!) 102   Ht 5\' 10"  (1.778 m)   Wt 228 lb (103.4 kg)   BMI 32.71 kg/m   Constitutional:  Alert and oriented, no acute distress, nontoxic appearing HEENT: [place], AT Cardiovascular: No clubbing, cyanosis, or edema Respiratory: Normal respiratory effort, no increased work of breathing GU: 16Fr SP tube in place and attached to leg bag Skin: No rashes, bruises or suspicious lesions Neurologic: Grossly intact, no focal deficits, moving all 4 extremities Psychiatric: Normal mood and affect  Laboratory Data: Results for orders placed or performed in visit on 02/13/19  Microscopic Examination   URINE  Result Value Ref Range   WBC, UA 0-5 0 - 5 /hpf   RBC 0-2 0 - 2 /hpf   Epithelial Cells (non renal) None seen 0 - 10 /hpf   Bacteria, UA None seen None seen/Few  Urinalysis, Complete  Result Value Ref Range   Specific Gravity, UA 1.015 1.005 - 1.030   pH, UA 7.0 5.0 - 7.5   Color, UA Yellow Yellow   Appearance Ur Clear Clear   Leukocytes,UA 1+ (A)  Negative   Protein,UA Negative Negative/Trace   Glucose, UA Negative Negative   Ketones, UA Negative Negative   RBC, UA Trace (A) Negative   Bilirubin, UA Negative Negative   Urobilinogen, Ur 0.2 0.2 - 1.0 mg/dL   Nitrite, UA Negative Negative   Microscopic Examination See below:    Assessment & Plan:   1. Neurogenic bladder SP tube in place today without acute issue.  I recommend continued monthly exchanges.  50 [place] is an appropriate size for him and I do not believe this requires adjustment.  Patient previously refused cystoscopy in May of this year.  I explained to him the importance of proceeding with this imaging at this time, as he is at increased risk of bladder tumors due to his indwelling SP tube.  He has agreed to this, however he strongly prefers cystoscopy to be performed  through his SP tube port rather than through his urethra. - Urinalysis, Complete - Return in about 4 weeks (around 03/13/2019) for Cystoscopy with Dr. Erlene Quan.  2. Abnormal urine sediment Patient has experienced multiple episodes of catheter occlusion this year requiring early tube exchange and ED visits.  He does flush his catheter on a daily basis.  I provided him with information today on vinegar irrigation solution.  I counseled him that I had like him to start doing this once to twice daily to assist with dissolution of his urinary sediment.  I also counseled him to increase his oral hydration and augment with citric acid, for example with the use of Crystal light lemonade flavor enhancers.  3. Urine culture positive Patient denies irritative voiding symptoms associated with the most recent positive urine culture.  UA today unconcerning for acute infection despite SP tube in place.  I do not believe patient has a true urinary tract infection and have counseled him to stop his prescribed Augmentin, as I do not believe he requires it at this time.  He expressed understanding.  I counseled him that  symptoms associated with acute urinary tract infection in patients with SP tubes are fevers, chills, flank pain, and suprapubic pain.  I explained that I expect him to be chronically colonized with bacteria due to his SP tube.  In the absence of the infective symptoms above, I counseled him that urine cultures are not necessary and would increase the risk of inappropriate antibiotic use.  More specifically, I counseled him that urinary odor and cloudiness are likely associated with concentrated urine and are not specific for urinary infection.  These signs alone do not warrant urine culture.  Debroah Loop, PA-C  [facility] [address], [place], [place] [address] [phone]

## 2019-02-14 LAB — MICROSCOPIC EXAMINATION
Bacteria, UA: NONE SEEN
Epithelial Cells (non renal): NONE SEEN /hpf (ref 0–10)

## 2019-02-14 LAB — URINALYSIS, COMPLETE
Bilirubin, UA: NEGATIVE
Glucose, UA: NEGATIVE
Ketones, UA: NEGATIVE
Nitrite, UA: NEGATIVE
Protein,UA: NEGATIVE
Specific Gravity, UA: 1.015 (ref 1.005–1.030)
Urobilinogen, Ur: 0.2 mg/dL (ref 0.2–1.0)
pH, UA: 7 (ref 5.0–7.5)

## 2019-02-18 ENCOUNTER — Other Ambulatory Visit: Payer: Self-pay

## 2019-02-18 ENCOUNTER — Encounter: Payer: Self-pay | Admitting: Emergency Medicine

## 2019-02-18 ENCOUNTER — Emergency Department
Admission: EM | Admit: 2019-02-18 | Discharge: 2019-02-18 | Disposition: A | Discharge status: Home / Self Care | Payer: Medicare HMO | Attending: Emergency Medicine | Admitting: Emergency Medicine

## 2019-02-18 ENCOUNTER — Emergency Department: Payer: Medicare HMO

## 2019-02-18 DIAGNOSIS — Y658 Other specified misadventures during surgical and medical care: Secondary | ICD-10-CM | POA: Insufficient documentation

## 2019-02-18 DIAGNOSIS — T83010A Breakdown (mechanical) of cystostomy catheter, initial encounter: Secondary | ICD-10-CM | POA: Diagnosis not present

## 2019-02-18 DIAGNOSIS — Z85038 Personal history of other malignant neoplasm of large intestine: Secondary | ICD-10-CM | POA: Diagnosis not present

## 2019-02-18 DIAGNOSIS — F17210 Nicotine dependence, cigarettes, uncomplicated: Secondary | ICD-10-CM | POA: Insufficient documentation

## 2019-02-18 DIAGNOSIS — R3989 Other symptoms and signs involving the genitourinary system: Secondary | ICD-10-CM | POA: Insufficient documentation

## 2019-02-18 DIAGNOSIS — I10 Essential (primary) hypertension: Secondary | ICD-10-CM | POA: Diagnosis not present

## 2019-02-18 DIAGNOSIS — Z8546 Personal history of malignant neoplasm of prostate: Secondary | ICD-10-CM | POA: Insufficient documentation

## 2019-02-18 DIAGNOSIS — Z85830 Personal history of malignant neoplasm of bone: Secondary | ICD-10-CM | POA: Diagnosis not present

## 2019-02-18 LAB — COMPREHENSIVE METABOLIC PANEL
ALT: 23 U/L (ref 0–44)
AST: 24 U/L (ref 15–41)
Albumin: 3.8 g/dL (ref 3.5–5.0)
Alkaline Phosphatase: 94 U/L (ref 38–126)
Anion gap: 14 (ref 5–15)
BUN: 14 mg/dL (ref 8–23)
CO2: 20 mmol/L — ABNORMAL LOW (ref 22–32)
Calcium: 8.9 mg/dL (ref 8.9–10.3)
Chloride: 107 mmol/L (ref 98–111)
Creatinine, Ser: 0.54 mg/dL — ABNORMAL LOW (ref 0.61–1.24)
GFR calc Af Amer: >60 mL/min (ref >60)
GFR calc non Af Amer: >60 mL/min (ref >60)
Glucose, Bld: 93 mg/dL (ref 70–99)
Potassium: 3.5 mmol/L (ref 3.5–5.1)
Sodium: 141 mmol/L (ref 135–145)
Total Bilirubin: 0.8 mg/dL (ref 0.3–1.2)
Total Protein: 7.1 g/dL (ref 6.5–8.1)

## 2019-02-18 LAB — URINALYSIS, COMPLETE (UACMP) WITH MICROSCOPIC
Bacteria, UA: NONE SEEN
Bilirubin Urine: NEGATIVE
Glucose, UA: NEGATIVE mg/dL
Ketones, ur: NEGATIVE mg/dL
Nitrite: NEGATIVE
Protein, ur: NEGATIVE mg/dL
Specific Gravity, Urine: 1.003 — ABNORMAL LOW (ref 1.005–1.030)
Squamous Epithelial / HPF: NONE SEEN (ref 0–5)
pH: 6 (ref 5.0–8.0)

## 2019-02-18 LAB — CBC
HCT: 41.4 % (ref 39.0–52.0)
Hemoglobin: 14.5 g/dL (ref 13.0–17.0)
MCH: 35.7 pg — ABNORMAL HIGH (ref 26.0–34.0)
MCHC: 35 g/dL (ref 30.0–36.0)
MCV: 102 fL — ABNORMAL HIGH (ref 80.0–100.0)
Platelets: 115 10*3/uL — ABNORMAL LOW (ref 150–400)
RBC: 4.06 MIL/uL — ABNORMAL LOW (ref 4.22–5.81)
RDW: 13.1 % (ref 11.5–15.5)
WBC: 8.2 10*3/uL (ref 4.0–10.5)
nRBC: 0 % (ref 0.0–0.2)

## 2019-02-18 LAB — LACTIC ACID, PLASMA: Lactic Acid, Venous: 3.6 mmol/L (ref 0.5–1.9)

## 2019-02-18 MED ORDER — CEPHALEXIN 500 MG PO CAPS: 500.0000 mg | ORAL_CAPSULE | Freq: Two times a day (BID) | ORAL | 0 refills | Status: AC

## 2019-02-18 MED ORDER — MORPHINE SULFATE (PF) 4 MG/ML IV SOLN
4.0000 mg | Freq: Once | INTRAVENOUS | Status: AC
  Administered 2019-02-18: 10:00:00 4 mg via INTRAVENOUS
  Filled 2019-02-18: qty 1

## 2019-02-18 MED ORDER — CEPHALEXIN 500 MG PO CAPS
500.0000 mg | ORAL_CAPSULE | Freq: Once | ORAL | Status: AC
  Administered 2019-02-18: 500 mg via ORAL
  Filled 2019-02-18: qty 1

## 2019-02-18 NOTE — ED Triage Notes (Signed)
Pt to ED via POV stating that he has a suprapubic catheter and it is blocked up. Pt states that he needs that have it changed out. Pt is in NAD.

## 2019-02-18 NOTE — ED Provider Notes (Signed)
[facility] Emergency Department Provider Note   ____________________________________________   First MD Initiated Contact with Patient 02/18/19 [phone]     (approximate)  I have reviewed the triage vital signs and the nursing notes.   HISTORY  Chief Complaint catheter blocked     HPI Gerald Odonnell is a 63 y.o. male presents for evaluation for discomfort around his pubic catheter   Patient reports he had his catheter changed last week.  It seemed to be doing okay, but this morning he reports it feels like a 5 out of 10 pain in his lower bladder area.  Feels like he potentially could have to have his catheter changes he is not seen much output from it.  He did change his urine bag last night, but is only seen a small amount of output this morning and its been somewhat dark in color.  No fevers or chills.  No nausea vomiting.  Reports that he has been able to flush the catheter, but has not seen good output  Follows with urology.  No chest pain or shortness of breath.  No leg pain.  Does report some pain seems to radiate up towards his urethra as well.  Past Medical History:  Diagnosis Date   Cancer [facility])    colon   Cancer ([place])    bone cancer   Colon cancer ([place])    Hx of   History of kidney stones    Hypertension    Prostate cancer ([place])    Prostate cancer ([place])    Hx of    S/P colon resection    Shortness of breath dyspnea    Suprapubic catheter [facility])    Urinary retention     Patient Active Problem List   Diagnosis Date Noted   Benign hypertension 08/22/2017   Goals of care, counseling/discussion 08/22/2017   Dysuria 08/22/2017   Urinary retention 03/15/2017   Prostate cancer ([place]) 03/15/2017   Abscess 01/24/2016   Sepsis ([place]) 09/10/2015   Chest pain 09/10/2015   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   Clostridium difficile diarrhea    Colitis, infectious 04/23/2015   Incomplete bladder emptying 09/10/2014   Edema leg  09/15/2013   Pelvic and perineal pain 09/15/2013   Lower extremity venous stasis 09/15/2013   Pelvic pain in male 09/15/2013   Benign prostatic hyperplasia with urinary obstruction 07/22/2013    Past Surgical History:  Procedure Laterality Date   ABDOMINAL SURGERY     COLON SURGERY     CYSTOSCOPY WITH LITHOLAPAXY N/A 06/09/2017   Procedure: CYSTOSCOPY WITH LITHOLAPAXY;  Surgeon: Hollice Espy, MD;  Location: [facility] ORS;  Service: Urology;  Laterality: N/A;   PROSTATE SURGERY  05/25/2016   SUPRAPUBIC CATHETER INSERTION      Prior to Admission medications   Medication Sig Start Date End Date Taking? Authorizing Provider  abiraterone acetate (ZYTIGA) 250 MG tablet TAKE 4 TABLETS (1,000 MG TOTAL) BY MOUTH DAILY. TAKE ON AN EMPTY STOMACH 1 HOUR BEFORE OR 2 HOURS AFTER A MEAL. 10/26/18  Yes Lloyd Huger, MD  furosemide (LASIX) 20 MG tablet TAKE ONE TABLET EVERY DAY AS NEEDED FOR SWELLING 12/26/18  Yes [place], Audie Clear, NP  oxybutynin (DITROPAN) 5 MG tablet Take 1 tablet (5 mg total) by mouth 4 (four) times daily. 12/13/18  Yes McGowan, Larene Beach A, PA-C  predniSONE (DELTASONE) 5 MG tablet TAKE 1 TABLET BY MOUTH ONCE DAILY WITH BREAKFAST 10/26/18  Yes Lloyd Huger, MD  amoxicillin-clavulanate (AUGMENTIN) 875-125 MG tablet Take  1 tablet by mouth 2 (two) times daily for 10 days. Patient not taking: Reported on 02/18/2019 02/09/19 02/19/19  Kendell Bane, NP  cephALEXin (KEFLEX) 500 MG capsule Take 1 capsule (500 mg total) by mouth 2 (two) times daily for 7 days. 02/18/19 02/25/19  Delman Kitten, MD  megestrol (MEGACE) 40 MG tablet Take 1 tablet (40 mg total) by mouth daily. Patient not taking: Reported on 02/18/2019 01/12/19   Lloyd Huger, MD  oxyCODONE (OXY IR/ROXICODONE) 5 MG immediate release tablet Take 1 tablet (5 mg total) by mouth every 6 (six) hours as needed. Patient not taking: Reported on 02/18/2019 12/26/18   Kendell Bane, NP    Allergies Patient has no  known allergies.  Family History  Problem Relation Age of Onset   Hypertension Other    Alcohol abuse Mother    Heart attack Father    Prostate cancer Brother    Breast cancer Sister     Social History Social History   Tobacco Use   Smoking status: Current Every Day Smoker    Packs/day: 0.25    Years: 40.00    Pack years: 10.00    Types: Cigarettes   Smokeless tobacco: Never Used   Tobacco comment: 1pack a week  Substance Use Topics   Alcohol use: Yes   Drug use: No    Review of Systems Constitutional: No fever/chills Eyes: No visual changes. ENT: No sore throat. Cardiovascular: Denies chest pain. Respiratory: Denies shortness of breath. Gastrointestinal: No abdominal pain except pain that he describes as bladder discomfort and some radiation into his urethra.   Genitourinary: See HPI Musculoskeletal: Negative for back pain. Skin: Negative for rash. Neurological: Negative for headaches, areas of focal weakness or numbness.    ____________________________________________   PHYSICAL EXAM:  VITAL SIGNS: ED Triage Vitals  Enc Vitals Group     BP 02/18/19 0853 (!) 154/102     Pulse Rate 02/18/19 0853 (!) 126     Resp 02/18/19 0853 18     Temp 02/18/19 0853 98.8 F (37.1 C)     Temp Source 02/18/19 0853 Oral     SpO2 02/18/19 0853 100 %     Weight 02/18/19 0849 240 lb (108.9 kg)     Height 02/18/19 0849 5\' 10"  (1.778 m)     Head Circumference --      Peak Flow --      Pain Score 02/18/19 0848 8     Pain Loc --      Pain Edu? --      Excl. in [place]? --     Constitutional: Alert and oriented. Well appearing and in no acute distress.  Does appear in discomfort with movement reporting pain in suprapubic area. Eyes: Conjunctivae are normal. Head: Atraumatic.  Nose: No congestion/rhinnorhea. Mouth/Throat: Mucous membranes are moist. Neck: No stridor.  Cardiovascular: Normal rate, regular rhythm. Grossly normal heart sounds.  Good peripheral  circulation. Respiratory: Normal respiratory effort.  No retractions. Lungs CTAB. Gastrointestinal: Soft and nontender except for moderate tenderness suprapubically. No distention.  Normal testicles and penis, nonerect.  Suprapubic catheter site is clean dry and intact.  There is no leakage around.  Using careful technique, basic stability I flushed with about 20 cc his suprapubic catheter, I was then able to withdraw received about the same amount of fluid back.  Has a slight purulent appearance and is somewhat darkish in color.  Musculoskeletal: No lower extremity tenderness nor edema. Neurologic:  Normal speech and language. No  gross focal neurologic deficits are appreciated.  Skin:  Skin is warm, dry and intact. No rash noted. Psychiatric: Mood and affect are normal. Speech and behavior are normal.  ____________________________________________   LABS (all labs ordered are listed, but only abnormal results are displayed)  Labs Reviewed  CBC - Abnormal; Notable for the following components:      Result Value   RBC 4.06 (*)    MCV 102.0 (*)    MCH 35.7 (*)    Platelets 115 (*)    All other components within normal limits  LACTIC ACID, PLASMA - Abnormal; Notable for the following components:   Lactic Acid, Venous 3.6 (*)    All other components within normal limits  URINALYSIS, COMPLETE (UACMP) WITH MICROSCOPIC - Abnormal; Notable for the following components:   Color, Urine YELLOW (*)    APPearance CLEAR (*)    Specific Gravity, Urine 1.003 (*)    Hgb urine dipstick LARGE (*)    Leukocytes,Ua SMALL (*)    All other components within normal limits  COMPREHENSIVE METABOLIC PANEL - Abnormal; Notable for the following components:   CO2 20 (*)    Creatinine, Ser 0.54 (*)    All other components within normal limits  URINE CULTURE   ____________________________________________  EKG   ____________________________________________  RADIOLOGY  Ct Renal Stone Study  Addendum  Date: 02/18/2019   ADDENDUM REPORT: 02/18/2019 10:23 ADDENDUM: Nodular opacity abutting the pleura in the right lower lobe measuring 6 x 5 mm. A small metastasis in this area must be of concern. Electronically Signed   By: Lowella Grip III M.D.   On: 02/18/2019 10:23   Result Date: 02/18/2019 CLINICAL DATA:  Abdominal pain. Suprapubic catheter present. History of prostate and colon carcinoma. EXAM: CT ABDOMEN AND PELVIS WITHOUT CONTRAST TECHNIQUE: Multidetector CT imaging of the abdomen and pelvis was performed following the standard protocol without oral or IV contrast. COMPARISON:  February 02, 2018 FINDINGS: Lower chest: There is atelectatic change in the left base. There is a nodular opacity abutting the pleura in the anterior segment of the right lower lobe measuring 6 x 5 mm, seen on axial slice 9 series 4. Hepatobiliary: Liver contour is unchanged with irregularity along portions of the surface of the liver and hypertrophy of the lateral segment left lobe of the liver. There is felt to be underlying hepatic cirrhosis. No focal liver lesions are evident on this noncontrast enhanced study. A previously noted small area of enhancement in the left lobe of the liver is not appreciable on this noncontrast enhanced study. The gallbladder wall is not appreciably thickened. There is no biliary duct dilatation. Pancreas: There is no pancreatic mass or inflammatory focus. Spleen: No splenic lesions are evident. Adrenals/Urinary Tract: Adrenals bilaterally appear stable. There is a 1.4 x 1.1 cm nodular area in the right adrenal and a 1.1 x 1.0 cm nodular area in the left adrenal. No new adrenal lesions are evident. Kidneys bilaterally show no evident mass or hydronephrosis on either side. There is no appreciable renal or ureteral calculus. The urinary bladder is decompressed with a suprapubic catheter in place. There is a stable calculus in the posterior aspect of the urinary bladder measuring 1.5 x 0.8 cm.  Stomach/Bowel: There are sigmoid diverticula without diverticulitis. Postoperative changes noted in the left abdomen with no surrounding inflammation in this area. Colonic anastomosis in the left upper abdomen is patent. There is no evident bowel wall or mesenteric thickening. There is no evident bowel obstruction. Terminal ileum  appears unremarkable. There is no demonstrable free air or portal venous air. Vascular/Lymphatic: No abdominal aortic aneurysm evident. There is aortic and iliac artery atherosclerotic calcification. There are stable prominent lymph nodes in the periportal and pericaval regions the largest lymph node in the periportal region measures 1.8 x 1.3 cm. The largest lymph node in the pericaval region is located between the inferior vena cava and left kidney measuring 1.5 x 1.3 cm. No new lymph node prominence is evident in the abdomen or pelvis. Reproductive: Seed implants are seen throughout the prostate. Prostate and seminal vesicles are not enlarged. Other: Appendix appears normal. No abscess or ascites evident in the abdomen or pelvis. Musculoskeletal: Widespread sclerotic bony metastases are noted, also present previously. No lytic or destructive bone lesions are evident. No intramuscular or abdominal wall lesions are evident. IMPRESSION: 1. Widespread sclerotic bony metastases noted throughout the visualized bony regions, consistent with known prostate carcinoma. Seed implants are present within the prostate. 2. Hepatic cirrhosis. Periportal and pericaval lymph nodes likely are of reactive etiology secondary to the cirrhosis. No new adenopathy. 3. Suprapubic catheter with decompressed urinary bladder. There is a calculus in the posterior aspect of the urinary bladder measuring 1.5 x 0.8 cm. No evident renal or ureteral calculus. No hydronephrosis. 4. Sigmoid diverticula without diverticulitis. No bowel obstruction. No abscess in the abdomen or pelvis. Appendix appears normal. 5.  Stable  small adrenal nodular lesions, likely of benign etiology. 6.  Aortic Atherosclerosis (ICD10-I70.0). Electronically Signed: By: Lowella Grip III M.D. On: 02/18/2019 10:20     ____________________________________________   PROCEDURES  Procedure(s) performed: None  Procedures  Critical Care performed: No  ____________________________________________   INITIAL IMPRESSION / ASSESSMENT AND PLAN / ED COURSE  Pertinent labs & imaging results that were available during my care of the patient were reviewed by me and considered in my medical decision making (see chart for details).   Differential diagnosis includes but is not limited to, abdominal perforation, aortic dissection, cholecystitis, appendicitis, diverticulitis, colitis, esophagitis/gastritis, kidney stone, pyelonephritis, urinary tract infection, aortic aneurysm. All are considered in decision and treatment plan. Based upon the patient's presentation and risk factors, suspect likely renal or urologic in etiology but will exclude other causes as well. I don't see signs of obvious SP catheter blockage.    Clinical Course as of Feb 18 1207  Sat Feb 18, 2019  1135 Discussed case, care, labs with Dr. Gloriann Loan (urology). Recommends treat with keflex 500 BID for a week and send a culuture. Can see in follow-up in clinic Monday.    [MQ]    Clinical Course User Index [MQ] Delman Kitten, MD   Reviewed his urology visit from October 19, he was on amoxicillin which was discontinued as he was lacking significant urinary tract and symptoms.  He now however seems to be experiencing suprapubic pain, I was able to flush and received some flow back through the catheter.  I did a ultrasound at the bedside, and did not see that the bladder was obviously distended, in fact it looks decompressed.  I have followed this by ordering a CT scan to further evaluate, as I am suspicious that his symptoms may not be from urinary catheter blockage, but rather could  be indicative of infection especially as he presented with elevated heart rate and now increasing pain in his suprapubic region.  He denies fevers or chills.  We will check basic labs, urinalysis urine culture.  CT scan to further evaluate.  ----------------------------------------- 12:06 PM on 02/18/2019 -----------------------------------------  Patient reports feeling improved, but the discomfort comes and goes has a feeling like he needs to urinate associated with the discomfort that shoots it will last for few minutes then go away.  He is resting comfortably now with vital signs of normalized, discussed his case including his CT scan and the fact that there is a new area on the right lung with Dr. Gloriann Loan, he recommends treatment at this point with cephalexin in the event that symptoms are coming from a developing cystitis, could also be attributed to his bladder stone, or other inflammatory condition.  Suspect likely some element of bladder spasm occurring, his catheter appears to be working well, his lab work reassuring.  You have a mildly elevated lactic acid, but vital signs now normalized and he appears nontoxic.  Discharged with antibiotic and will follow up Monday with urology team  Return precautions and treatment recommendations and follow-up discussed with the patient who is agreeable with the plan.  ____________________________________________   FINAL CLINICAL IMPRESSION(S) / ED DIAGNOSES  Final diagnoses:  Bladder pain  Suprapubic catheter dysfunction, initial encounter [facility])        Note:  This document was prepared using Dragon voice recognition software and may include unintentional dictation errors       Delman Kitten, MD 02/18/19 1208

## 2019-02-18 NOTE — ED Notes (Signed)
Date and time results received: 02/18/19 1017  Test: Lactic Acid Critical Value: 3.6  Name of Provider Notified: Dr. Jacqualine Code, MD

## 2019-02-20 LAB — URINE CULTURE
Culture: 60000 — AB
Special Requests: NORMAL

## 2019-02-21 NOTE — Progress Notes (Signed)
Brief Pharmacy Note  62 y/o M with history of metastatic prostate cancer and neurogenic bladder with suprapubic catheter presented to [facility] ED 10/24 c/o discomfort around catheter. No fevers or chills. Patient was discharged on cephalexin due to c/f possible UTI. Urine culture results obtained at ED visit have resulted 60k colonies/mL Klebsiella oxytoca (cefazolin resistant, ceftriaxone sensitive) and E faecalis. Spoke with EDP about concern for resistance of cultured bacteria to discharge antibiotic with plan to assess patient and prescribe alternate antibiotic.   Patient was reached via telephone. He reports he is feeling much better on the antibiotics. He denies discomfort/pain around catheter site. No fevers, chills, weakness. Informed him of urine culture result and that antibiotic may not be effective for bacteria identified. Advised him to reach out to a healthcare provider or [facility] if symptoms return. Will not pursue change to antibiotic regimen at this time as he is likely chronically colonized. Discussed with EDP.   [place] Resident 21 February 2019

## 2019-03-06 ENCOUNTER — Telehealth: Payer: Self-pay | Admitting: Urology

## 2019-03-06 NOTE — Telephone Encounter (Signed)
Pt had to cancel Cysto sched for 11/10 pt states he has been sick for almost a week and not feeling better, pt did not wish to reschedule at this time, states he will call back to reschedule when he is feeling better.   FYI

## 2019-03-07 ENCOUNTER — Other Ambulatory Visit: Payer: Self-pay | Admitting: Oncology

## 2019-03-07 ENCOUNTER — Other Ambulatory Visit: Payer: Medicare HMO | Admitting: Urology

## 2019-03-07 DIAGNOSIS — C61 Malignant neoplasm of prostate: Secondary | ICD-10-CM

## 2019-03-07 MED FILL — predniSONE 5 MG TABS: 5 | 30 days supply | Qty: 30 | Fill #0

## 2019-03-07 MED FILL — ABIRATERONE ACETATE 250 MG: 250 | 30 days supply | Qty: 120 | Fill #0

## 2019-03-21 ENCOUNTER — Other Ambulatory Visit: Payer: Self-pay | Admitting: Adult Health

## 2019-03-21 DIAGNOSIS — R609 Edema, unspecified: Secondary | ICD-10-CM

## 2019-04-06 MED FILL — ABIRATERONE ACETATE 250 MG: 250 | 30 days supply | Qty: 120 | Fill #1

## 2019-04-06 MED FILL — predniSONE 5 MG TABS: 5 | 30 days supply | Qty: 30 | Fill #1

## 2019-04-09 ENCOUNTER — Other Ambulatory Visit: Payer: Self-pay

## 2019-04-09 ENCOUNTER — Emergency Department: Payer: Medicare HMO

## 2019-04-09 ENCOUNTER — Emergency Department
Admission: EM | Admit: 2019-04-09 | Discharge: 2019-04-09 | Disposition: A | Discharge status: Home / Self Care | Payer: Medicare HMO | Attending: Emergency Medicine | Admitting: Emergency Medicine

## 2019-04-09 ENCOUNTER — Encounter: Payer: Self-pay | Admitting: *Deleted

## 2019-04-09 DIAGNOSIS — C7951 Secondary malignant neoplasm of bone: Secondary | ICD-10-CM | POA: Insufficient documentation

## 2019-04-09 DIAGNOSIS — F17210 Nicotine dependence, cigarettes, uncomplicated: Secondary | ICD-10-CM | POA: Insufficient documentation

## 2019-04-09 DIAGNOSIS — Y9301 Activity, walking, marching and hiking: Secondary | ICD-10-CM | POA: Insufficient documentation

## 2019-04-09 DIAGNOSIS — Y999 Unspecified external cause status: Secondary | ICD-10-CM | POA: Diagnosis not present

## 2019-04-09 DIAGNOSIS — W19XXXA Unspecified fall, initial encounter: Secondary | ICD-10-CM

## 2019-04-09 DIAGNOSIS — Y92018 Other place in single-family (private) house as the place of occurrence of the external cause: Secondary | ICD-10-CM | POA: Insufficient documentation

## 2019-04-09 DIAGNOSIS — I10 Essential (primary) hypertension: Secondary | ICD-10-CM | POA: Insufficient documentation

## 2019-04-09 DIAGNOSIS — W010XXA Fall on same level from slipping, tripping and stumbling without subsequent striking against object, initial encounter: Secondary | ICD-10-CM | POA: Diagnosis not present

## 2019-04-09 DIAGNOSIS — M549 Dorsalgia, unspecified: Secondary | ICD-10-CM | POA: Diagnosis present

## 2019-04-09 DIAGNOSIS — C61 Malignant neoplasm of prostate: Secondary | ICD-10-CM | POA: Insufficient documentation

## 2019-04-09 LAB — COMPREHENSIVE METABOLIC PANEL
ALT: 22 U/L (ref 0–44)
AST: 27 U/L (ref 15–41)
Albumin: 3.9 g/dL (ref 3.5–5.0)
Alkaline Phosphatase: 100 U/L (ref 38–126)
Anion gap: 11 (ref 5–15)
BUN: 14 mg/dL (ref 8–23)
CO2: 23 mmol/L (ref 22–32)
Calcium: 8.6 mg/dL — ABNORMAL LOW (ref 8.9–10.3)
Chloride: 108 mmol/L (ref 98–111)
Creatinine, Ser: 0.57 mg/dL — ABNORMAL LOW (ref 0.61–1.24)
GFR calc Af Amer: >60 mL/min (ref >60)
GFR calc non Af Amer: >60 mL/min (ref >60)
Glucose, Bld: 92 mg/dL (ref 70–99)
Potassium: 3.6 mmol/L (ref 3.5–5.1)
Sodium: 142 mmol/L (ref 135–145)
Total Bilirubin: 0.8 mg/dL (ref 0.3–1.2)
Total Protein: 7.1 g/dL (ref 6.5–8.1)

## 2019-04-09 LAB — CBC WITH DIFFERENTIAL/PLATELET
Abs Immature Granulocytes: 0.03 10*3/uL (ref 0.00–0.07)
Basophils Absolute: 0 10*3/uL (ref 0.0–0.1)
Basophils Relative: 1 %
Eosinophils Absolute: 0 10*3/uL (ref 0.0–0.5)
Eosinophils Relative: 0 %
HCT: 42.1 % (ref 39.0–52.0)
Hemoglobin: 14.6 g/dL (ref 13.0–17.0)
Immature Granulocytes: 0 %
Lymphocytes Relative: 25 %
Lymphs Abs: 2 10*3/uL (ref 0.7–4.0)
MCH: 35.5 pg — ABNORMAL HIGH (ref 26.0–34.0)
MCHC: 34.7 g/dL (ref 30.0–36.0)
MCV: 102.4 fL — ABNORMAL HIGH (ref 80.0–100.0)
Monocytes Absolute: 0.5 10*3/uL (ref 0.1–1.0)
Monocytes Relative: 6 %
Neutro Abs: 5.2 10*3/uL (ref 1.7–7.7)
Neutrophils Relative %: 68 %
Platelets: 125 10*3/uL — ABNORMAL LOW (ref 150–400)
RBC: 4.11 MIL/uL — ABNORMAL LOW (ref 4.22–5.81)
RDW: 13.1 % (ref 11.5–15.5)
WBC: 7.7 10*3/uL (ref 4.0–10.5)
nRBC: 0 % (ref 0.0–0.2)

## 2019-04-09 LAB — URINALYSIS, COMPLETE (UACMP) WITH MICROSCOPIC
Bilirubin Urine: NEGATIVE
Glucose, UA: NEGATIVE mg/dL
Hgb urine dipstick: NEGATIVE
Ketones, ur: NEGATIVE mg/dL
Nitrite: POSITIVE — AB
Protein, ur: NEGATIVE mg/dL
Specific Gravity, Urine: 1.006 (ref 1.005–1.030)
Squamous Epithelial / HPF: NONE SEEN (ref 0–5)
pH: 6 (ref 5.0–8.0)

## 2019-04-09 LAB — TROPONIN I (HIGH SENSITIVITY): Troponin I (High Sensitivity): 5 ng/L (ref <18)

## 2019-04-09 MED ORDER — MORPHINE SULFATE (PF) 4 MG/ML IV SOLN: 4.0000 mg | Freq: Once | INTRAVENOUS | Status: AC

## 2019-04-09 MED ORDER — ONDANSETRON HCL 4 MG/2ML IJ SOLN
INTRAMUSCULAR | Status: AC
  Administered 2019-04-09: 4 mg via INTRAVENOUS
  Filled 2019-04-09: qty 2

## 2019-04-09 MED ORDER — HYDROMORPHONE HCL 1 MG/ML IJ SOLN
0.5000 mg | Freq: Once | INTRAMUSCULAR | Status: AC
  Administered 2019-04-09: 0.5 mg via INTRAVENOUS
  Filled 2019-04-09: qty 1

## 2019-04-09 MED ORDER — MORPHINE SULFATE (PF) 4 MG/ML IV SOLN
INTRAVENOUS | Status: AC
  Administered 2019-04-09: 4 mg via INTRAVENOUS
  Filled 2019-04-09: qty 1

## 2019-04-09 MED ORDER — ONDANSETRON HCL 4 MG/2ML IJ SOLN: 4.0000 mg | Freq: Once | INTRAMUSCULAR | Status: AC

## 2019-04-09 NOTE — ED Triage Notes (Signed)
Pt to ED via EMS from home after a fall while walking to chair. Pt does not use a walker or cane at home but reportys new onset of bilateral leg weakness. Fall today was due to legs giving out on him per EMS.No new injury or obvious deformities. pain medication at home q 4 hours.  Hx of bone cancer.  93 NSR 93 CBG

## 2019-04-09 NOTE — ED Notes (Addendum)
Pt requesting Ginger ale. MD reviewing chart but does not want pt to have PO intake at this time.

## 2019-04-09 NOTE — ED Notes (Signed)
PT in MRI.

## 2019-04-09 NOTE — ED Notes (Signed)
Pt gave two numbers for his neighbor Marciano Sequin. Cell number [id number] goes straight to voicemail and home number [phone] which rings multiple times with no answer. Both of these numbers were attempted x2 by this RN.

## 2019-04-09 NOTE — ED Provider Notes (Signed)
[facility] Emergency Department Provider Note   ____________________________________________   First MD Initiated Contact with Patient 04/09/19 1454     (approximate)  I have reviewed the triage vital signs and the nursing notes.   HISTORY  Chief Complaint Fall and Weakness    HPI Gerald Odonnell is a 63 y.o. male with past medical history of diffusely metastatic prostate cancer and urinary retention status post suprapubic catheter presents to the ED following fall.  Patient reports that just prior to arrival he was attempting to walk to a chair when both of his legs seem to suddenly get weak and give out on him.  He fell onto his back, but denies hitting his head or losing consciousness.  He states he deals with pain throughout much of his back on a regular basis due to cancer, but the weakness in his legs is new.  He states it affects his right leg greater than his left and describes it as "my leg is dead".  He states his suprapubic catheter has been functioning as usual and denies any numbness in his groin.  He does describe some numbness affecting both of his legs, but is unable to delineate where this begins.  He denies any numbness or weakness in his upper extremities, has not had any difficulty speaking or with his vision.  He denies hitting his head or losing consciousness with the fall, also denies any neck pain.        Past Medical History:  Diagnosis Date  . Cancer ([place])    colon  . Cancer ([place])    bone cancer  . Colon cancer ([place])    Hx of  . History of kidney stones   . Hypertension   . Prostate cancer ([place])   . Prostate cancer ([place])    Hx of   . S/P colon resection   . Shortness of breath dyspnea   . Suprapubic catheter ([place])   . Urinary retention     Patient Active Problem List   Diagnosis Date Noted  . Benign hypertension 08/22/2017  . Goals of care, counseling/discussion 08/22/2017  . Dysuria 08/22/2017  . Urinary retention  03/15/2017  . Prostate cancer ([place]) 03/15/2017  . Abscess 01/24/2016  . Sepsis ([place]) 09/10/2015  . Chest pain 09/10/2015  . 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 Clostridium difficile diarrhea   . Colitis, infectious 04/23/2015  . Incomplete bladder emptying 09/10/2014  . Edema leg 09/15/2013  . Pelvic and perineal pain 09/15/2013  . Lower extremity venous stasis 09/15/2013  . Pelvic pain in male 09/15/2013  . Benign prostatic hyperplasia with urinary obstruction 07/22/2013    Past Surgical History:  Procedure Laterality Date  . ABDOMINAL SURGERY    . COLON SURGERY    . CYSTOSCOPY WITH LITHOLAPAXY N/A 06/09/2017   Procedure: CYSTOSCOPY WITH LITHOLAPAXY;  Surgeon: Hollice Espy, MD;  Location: [facility] ORS;  Service: Urology;  Laterality: N/A;  . PROSTATE SURGERY  05/25/2016  . SUPRAPUBIC CATHETER INSERTION      Prior to Admission medications   Medication Sig Start Date End Date Taking? Authorizing Provider  abiraterone acetate (ZYTIGA) 250 MG tablet TAKE 4 TABLETS (1,000 MG TOTAL) BY MOUTH DAILY. TAKE ON AN EMPTY STOMACH 1 HOUR BEFORE OR 2 HOURS AFTER A MEAL. 03/07/19   Lloyd Huger, MD  furosemide (LASIX) 20 MG tablet TAKE ONE TABLET EVERY DAY AS NEEDED FOR SWELLING 03/21/19   Kendell Bane, NP  megestrol (MEGACE) 40 MG tablet Take 1  tablet (40 mg total) by mouth daily. Patient not taking: Reported on 02/18/2019 01/12/19   Lloyd Huger, MD  oxybutynin (DITROPAN) 5 MG tablet Take 1 tablet (5 mg total) by mouth 4 (four) times daily. 12/13/18   Zara Council A, PA-C  oxyCODONE (OXY IR/ROXICODONE) 5 MG immediate release tablet Take 1 tablet (5 mg total) by mouth every 6 (six) hours as needed. Patient not taking: Reported on 02/18/2019 12/26/18   Kendell Bane, NP  predniSONE (DELTASONE) 5 MG tablet TAKE 1 TABLET BY MOUTH ONCE DAILY WITH BREAKFAST 03/07/19   Lloyd Huger, MD    Allergies Patient has no known allergies.  Family History  Problem Relation Age of  Onset  . Hypertension Other   . Alcohol abuse Mother   . Heart attack Father   . Prostate cancer Brother   . Breast cancer Sister     Social History Social History   Tobacco Use  . Smoking status: Current Every Day Smoker    Packs/day: 0.25    Years: 40.00    Pack years: 10.00    Types: Cigarettes  . Smokeless tobacco: Never Used  . Tobacco comment: 1pack a week  Substance Use Topics  . Alcohol use: Yes  . Drug use: No    Review of Systems  Constitutional: No fever/chills Eyes: No visual changes. ENT: No sore throat. Cardiovascular: Denies chest pain. Respiratory: Denies shortness of breath. Gastrointestinal: No abdominal pain.  No nausea, no vomiting.  No diarrhea.  No constipation. Genitourinary: Negative for dysuria. Musculoskeletal: Positive for back pain. Skin: Negative for rash. Neurological: Negative for headaches.  Positive for bilateral lower extremity numbness and weakness. ____________________________________________   PHYSICAL EXAM:  VITAL SIGNS: ED Triage Vitals  Enc Vitals Group     BP 04/09/19 1454 (!) 136/101     Pulse Rate 04/09/19 1454 88     Resp 04/09/19 1454 16     Temp 04/09/19 1454 98.7 F (37.1 C)     Temp Source 04/09/19 1454 Oral     SpO2 04/09/19 1454 99 %     Weight 04/09/19 1448 240 lb 1.3 oz (108.9 kg)     Height --      Head Circumference --      Peak Flow --      Pain Score 04/09/19 1456 10     Pain Loc --      Pain Edu? --      Excl. in [place]? --     Constitutional: Alert and oriented. Eyes: Conjunctivae are normal. Head: Atraumatic. Nose: No congestion/rhinnorhea. Mouth/Throat: Mucous membranes are moist. Neck: Normal ROM Cardiovascular: Normal rate, regular rhythm. Grossly normal heart sounds. Respiratory: Normal respiratory effort.  No retractions. Lungs CTAB. Gastrointestinal: Soft and nontender. No distention.  Suprapubic catheter in place with site clean, dry, and intact.  Collecting bag with clear yellow  urine. Genitourinary: deferred Musculoskeletal: No lower extremity tenderness nor edema. Neurologic:  Normal speech and language.  3/5 strength in bilateral lower extremities with diminished sensation approximately below his knees bilaterally. Skin:  Skin is warm, dry and intact. No rash noted. Psychiatric: Mood and affect are normal. Speech and behavior are normal.  ____________________________________________   LABS (all labs ordered are listed, but only abnormal results are displayed)  Labs Reviewed  COMPREHENSIVE METABOLIC PANEL - Abnormal; Notable for the following components:      Result Value   Creatinine, Ser 0.57 (*)    Calcium 8.6 (*)    All other components  within normal limits  CBC WITH DIFFERENTIAL/PLATELET - Abnormal; Notable for the following components:   RBC 4.11 (*)    MCV 102.4 (*)    MCH 35.5 (*)    Platelets 125 (*)    All other components within normal limits  URINALYSIS, COMPLETE (UACMP) WITH MICROSCOPIC - Abnormal; Notable for the following components:   Color, Urine YELLOW (*)    APPearance CLEAR (*)    Nitrite POSITIVE (*)    Leukocytes,Ua SMALL (*)    Bacteria, UA FEW (*)    All other components within normal limits  URINE CULTURE  TROPONIN I (HIGH SENSITIVITY)   ____________________________________________  EKG  ED ECG REPORT I, Blake Divine, the attending physician, personally viewed and interpreted this ECG.   Date: 04/09/2019  EKG Time: 14:54  Rate: 87  Rhythm: normal sinus rhythm  Axis: Normal  Intervals:Borderline prolonged QT  ST&T Change: None   PROCEDURES  Procedure(s) performed (including Critical Care):  Procedures   ____________________________________________   INITIAL IMPRESSION / ASSESSMENT AND PLAN / ED COURSE       63 year old male with prostate cancer diffusely metastatic to bone presents to the ED for acute on chronic diffuse back pain as well as acute weakness to his bilateral lower extremities, resulting  in a fall.  He does not appear to have sustained any significant injuries secondary to the fall, denies hitting his head or losing consciousness.  He has no midline cervical spine tenderness, but otherwise has significant spinal tenderness in his lumbar region.  He also appears weak in both lower extremities, is not able to lift his legs up off of the stretcher for more than a moment, along with decreased sensation to bilateral lower extremities.  He does have intact pulses to both lower extremities.  EKG without evidence of arrhythmia or ischemia and initial lab work is reassuring.  He does have nitrites from urine sample, but denies any symptoms of UTI, will hold off on antibiotics and sent for culture.  He will require MRI to assess for spinal pathology given his bony metastatic disease and new onset weakness.  Do not suspect stroke given symptoms are bilateral and limited to his lower extremities.    MRI negative for acute process, no evidence of spinal cord compression.  Patient's lab work is unremarkable, no evidence of infectious process.  Patient is requesting to be discharged home and he is now able to walk in the ED without difficulty.  I have encouraged him to follow-up with oncology as well has his PCP, patient agrees with plan.      ____________________________________________   FINAL CLINICAL IMPRESSION(S) / ED DIAGNOSES  Final diagnoses:  Fall, initial encounter  Prostate cancer metastatic to bone [facility])     ED Discharge Orders    None       Note:  This document was prepared using Dragon voice recognition software and may include unintentional dictation errors.   Blake Divine, MD 04/10/19 [phone]

## 2019-04-09 NOTE — Progress Notes (Signed)
[place]  [phone] Fax:([phone]) [phone]  ID: Gerald Odonnell OB: 03/31/1956  MR#: [id number]  [id number]  Patient Care Team: Lavera Guise, MD as PCP - General (Internal Medicine) Ronnell Freshwater, NP (Family Medicine)  CHIEF COMPLAINT: Prostate cancer metastatic to bone  INTERVAL HISTORY: Patient returns to clinic today for repeat laboratory work, further evaluation, and continuation of Zytiga and prednisone.  Patient states he has a poor appetite and feels sick after swallowing several bites of food, but otherwise feels well and his weight has remained stable.  He is tolerating his treatments without significant side effects. He continues to have urinary retention and has a suprapubic tube.  He has no neurologic complaints.  He denies any recent fevers or illnesses. He denies any pain.  He denies any chest pain, shortness of breath, cough, or hemoptysis.  He denies any nausea, vomiting, constipation, or diarrhea.  Patient offers no further specific complaints today.  REVIEW OF SYSTEMS:   Review of Systems  Constitutional: Negative.  Negative for fever, malaise/fatigue and weight loss.  Respiratory: Negative.  Negative for cough, hemoptysis and shortness of breath.   Cardiovascular: Negative.  Negative for chest pain and leg swelling.  Gastrointestinal: Negative.  Negative for abdominal pain, blood in stool and melena.  Genitourinary: Negative.  Negative for dysuria and hematuria.  Musculoskeletal: Negative.  Negative for back pain.  Skin: Negative.  Negative for rash.  Neurological: Negative.  Negative for focal weakness, weakness and headaches.  Psychiatric/Behavioral: Negative.  The patient is not nervous/anxious.     As per HPI. Otherwise, a complete review of systems is negative.  PAST MEDICAL HISTORY: Past Medical History:  Diagnosis Date  . Cancer ([place])    colon  . Cancer ([place])    bone cancer  . Colon cancer ([place])    Hx of  .  History of kidney stones   . Hypertension   . Prostate cancer ([place])   . Prostate cancer ([place])    Hx of   . S/P colon resection   . Shortness of breath dyspnea   . Suprapubic catheter ([place])   . Urinary retention     PAST SURGICAL HISTORY: Past Surgical History:  Procedure Laterality Date  . ABDOMINAL SURGERY    . COLON SURGERY    . CYSTOSCOPY WITH LITHOLAPAXY N/A 06/09/2017   Procedure: CYSTOSCOPY WITH LITHOLAPAXY;  Surgeon: Hollice Espy, MD;  Location: [facility] ORS;  Service: Urology;  Laterality: N/A;  . PROSTATE SURGERY  05/25/2016  . SUPRAPUBIC CATHETER INSERTION      FAMILY HISTORY: Family History  Problem Relation Age of Onset  . Hypertension Other   . Alcohol abuse Mother   . Heart attack Father   . Prostate cancer Brother   . Breast cancer Sister     ADVANCED DIRECTIVES (Y/N):  N  HEALTH MAINTENANCE: Social History   Tobacco Use  . Smoking status: Current Every Day Smoker    Packs/day: 0.25    Years: 40.00    Pack years: 10.00    Types: Cigarettes  . Smokeless tobacco: Never Used  . Tobacco comment: 1pack a week  Substance Use Topics  . Alcohol use: Yes  . Drug use: No     Colonoscopy:  PAP:  Bone density:  Lipid panel:  No Known Allergies  Current Outpatient Medications  Medication Sig Dispense Refill  . abiraterone acetate (ZYTIGA) 250 MG tablet TAKE 4 TABLETS (1,000 MG TOTAL) BY MOUTH DAILY. TAKE ON AN EMPTY STOMACH 1  HOUR BEFORE OR 2 HOURS AFTER A MEAL. 120 tablet 3  . furosemide (LASIX) 20 MG tablet TAKE ONE TABLET EVERY DAY AS NEEDED FOR SWELLING 30 tablet 1  . megestrol (MEGACE) 40 MG tablet Take 1 tablet (40 mg total) by mouth daily. (Patient not taking: Reported on 02/18/2019) 30 tablet 0  . oxybutynin (DITROPAN) 5 MG tablet Take 1 tablet (5 mg total) by mouth 4 (four) times daily. 120 tablet 6  . oxyCODONE (OXY IR/ROXICODONE) 5 MG immediate release tablet Take 1 tablet (5 mg total) by mouth every 6 (six) hours as needed. (Patient not  taking: Reported on 02/18/2019) 8 tablet 0  . predniSONE (DELTASONE) 5 MG tablet TAKE 1 TABLET BY MOUTH ONCE DAILY WITH BREAKFAST 30 tablet 3   No current facility-administered medications for this visit.    OBJECTIVE: Vitals:   04/13/19 0926  BP: 132/89  Pulse: (!) 109  Resp: 19  Temp: 99.8 F (37.7 C)  SpO2: 99%     Body mass index is 32.71 kg/m.    ECOG FS:0 - Asymptomatic   General: Well-developed, well-nourished, no acute distress. Eyes: Pink conjunctiva, anicteric sclera. HEENT: Normocephalic, moist mucous membranes. Lungs: No audible wheezing or coughing. Heart: Regular rate and rhythm. Abdomen: Soft, nontender, no obvious distention. Musculoskeletal: No edema, cyanosis, or clubbing. Neuro: Alert, answering all questions appropriately. Cranial nerves grossly intact. Skin: No rashes or petechiae noted. Psych: Normal affect.  LAB RESULTS:  Lab Results  Component Value Date   NA 138 04/13/2019   K 4.0 04/13/2019   CL 106 04/13/2019   CO2 21 (L) 04/13/2019   GLUCOSE 113 (H) 04/13/2019   BUN 16 04/13/2019   CREATININE 0.55 (L) 04/13/2019   CALCIUM 9.5 04/13/2019   PROT 7.8 04/13/2019   ALBUMIN 4.2 04/13/2019   AST 34 04/13/2019   ALT 25 04/13/2019   ALKPHOS 109 04/13/2019   BILITOT 1.3 (H) 04/13/2019   GFRNONAA >60 04/13/2019   GFRAA >60 04/13/2019    Lab Results  Component Value Date   WBC 9.7 04/13/2019   NEUTROABS 7.6 04/13/2019   HGB 15.0 04/13/2019   HCT 43.3 04/13/2019   MCV 103.8 (H) 04/13/2019   PLT 127 (L) 04/13/2019     STUDIES: MR LUMBAR SPINE WO CONTRAST  Result Date: 04/09/2019 CLINICAL DATA:  The patient fell today. New onset of bilateral leg weakness. Metastatic prostate cancer. EXAM: MRI LUMBAR SPINE WITHOUT CONTRAST TECHNIQUE: Multiplanar, multisequence MR imaging of the lumbar spine was performed. No intravenous contrast was administered. COMPARISON:  CT scan of the abdomen and pelvis dated 04/20/2019. FINDINGS: Segmentation:   Standard. Alignment:  Physiologic. Vertebrae: The patient has innumerable tiny sclerotic lesions throughout the visualized portion of the spine as demonstrated on the prior CT scan of 02/18/2019 consistent with extensive metastatic disease secondary to prostate cancer. No pathologic fractures. Conus medullaris and cauda equina: Conus extends to the L2-3 level. Conus and cauda equina appear normal. Paraspinal and other soft tissues: Negative. No adenopathy or other significant abnormality. Disc levels: T12-L1: Normal. L1-2: Normal. L2-3: Tiny disc bulges into the neural foramina with no neural impingement. Otherwise normal. L3-4: Tiny disc bulges into the neural foramina without neural impingement. Otherwise normal. L4-5: Small broad-based disc bulge. Small protrusion in and lateral to the right neural foramen without neural impingement. Slight hypertrophy of the ligamentum flavum and facet joints. L5-S1: Normal. IMPRESSION: 1. No acute abnormality of the lumbar spine. Specifically, no evidence of neural impingement in the distal thoracic spine or in the  lumbar spine. 2. Previously noted innumerable tiny sclerotic lesions throughout the visualized portion of the spine consistent with extensive metastatic disease secondary to prostate cancer. 3. No other significant abnormalities. Electronically Signed   By: Lorriane Shire M.D.   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 In 2017 upon diagnosis, he reportedly received XRT along with brachii therapy.  He last received Lupron in September 2018.  Patient states he had allergic reaction to Lupron and refuses to take any further injections.  He also was given a prescription for Casodex by urology, but has  refused to take this as well.  He states he will never take any chemotherapy, but reluctantly agreed to initiate Zytiga plus prednisone.  Typically in castrate sensitive disease should be treated with both Zytiga and Lupron, but patient is refusing injections as above.  Patient is tolerating his treatments well without significant side effects.  His PSA continues to trend down is now 2.63, today's result is pending.  Continue Zytiga and prednisone indefinitely or until progression of disease. Return to clinic in 3 months with repeat laboratory work and further evaluation. 2.  Suprapubic catheter: Patient states he has home health to manage it.  He does not want to return to urology at this time. 3.  Poor appetite: We discussed the possibility of GI or dietary referral which patient is not interested in at this time. 4.  Hyperbilirubinemia: Mild, monitor. 5.  Thrombocytopenia: Chronic and unchanged.  Patient expressed understanding and was in agreement with this plan. He also understands that He can call clinic at any time with any questions, concerns, or complaints.   Cancer Staging Prostate cancer [facility]) Staging form: Prostate, AJCC 8th Edition - Clinical stage from 03/04/2018: Stage IVB (cT3a, cN0, cM1b, Grade Group: 5) - Signed by Lloyd Huger, MD on 03/04/2018   Lloyd Huger, MD   04/13/2019 12:18 PM

## 2019-04-09 NOTE — ED Notes (Signed)
Pt asking for water, informed him that he would be able to have water after his MRI results came back.  Pt had to be reminded to keep mask on as well.

## 2019-04-12 ENCOUNTER — Other Ambulatory Visit: Payer: Self-pay

## 2019-04-13 ENCOUNTER — Inpatient Hospital Stay: Payer: Medicare HMO | Attending: Oncology

## 2019-04-13 ENCOUNTER — Inpatient Hospital Stay (HOSPITAL_BASED_OUTPATIENT_CLINIC_OR_DEPARTMENT_OTHER): Payer: Medicare HMO | Admitting: Oncology

## 2019-04-13 ENCOUNTER — Other Ambulatory Visit: Payer: Self-pay

## 2019-04-13 ENCOUNTER — Encounter: Payer: Self-pay | Admitting: Oncology

## 2019-04-13 VITALS — BP 132/89 | HR 109 | Temp 99.8°F | Resp 19 | Wt 228.0 lb

## 2019-04-13 DIAGNOSIS — C7951 Secondary malignant neoplasm of bone: Secondary | ICD-10-CM | POA: Insufficient documentation

## 2019-04-13 DIAGNOSIS — I10 Essential (primary) hypertension: Secondary | ICD-10-CM | POA: Insufficient documentation

## 2019-04-13 DIAGNOSIS — F17210 Nicotine dependence, cigarettes, uncomplicated: Secondary | ICD-10-CM | POA: Diagnosis not present

## 2019-04-13 DIAGNOSIS — D696 Thrombocytopenia, unspecified: Secondary | ICD-10-CM | POA: Diagnosis not present

## 2019-04-13 DIAGNOSIS — C61 Malignant neoplasm of prostate: Secondary | ICD-10-CM

## 2019-04-13 LAB — CBC WITH DIFFERENTIAL/PLATELET
Abs Immature Granulocytes: 0.04 10*3/uL (ref 0.00–0.07)
Basophils Absolute: 0.1 10*3/uL (ref 0.0–0.1)
Basophils Relative: 1 %
Eosinophils Absolute: 0 10*3/uL (ref 0.0–0.5)
Eosinophils Relative: 0 %
HCT: 43.3 % (ref 39.0–52.0)
Hemoglobin: 15 g/dL (ref 13.0–17.0)
Immature Granulocytes: 0 %
Lymphocytes Relative: 15 %
Lymphs Abs: 1.4 10*3/uL (ref 0.7–4.0)
MCH: 36 pg — ABNORMAL HIGH (ref 26.0–34.0)
MCHC: 34.6 g/dL (ref 30.0–36.0)
MCV: 103.8 fL — ABNORMAL HIGH (ref 80.0–100.0)
Monocytes Absolute: 0.5 10*3/uL (ref 0.1–1.0)
Monocytes Relative: 5 %
Neutro Abs: 7.6 10*3/uL (ref 1.7–7.7)
Neutrophils Relative %: 79 %
Platelets: 127 10*3/uL — ABNORMAL LOW (ref 150–400)
RBC: 4.17 MIL/uL — ABNORMAL LOW (ref 4.22–5.81)
RDW: 12.8 % (ref 11.5–15.5)
WBC: 9.7 10*3/uL (ref 4.0–10.5)
nRBC: 0 % (ref 0.0–0.2)

## 2019-04-13 LAB — PHOSPHORUS: Phosphorus: 3.4 mg/dL (ref 2.5–4.6)

## 2019-04-13 LAB — COMPREHENSIVE METABOLIC PANEL
ALT: 25 U/L (ref 0–44)
AST: 34 U/L (ref 15–41)
Albumin: 4.2 g/dL (ref 3.5–5.0)
Alkaline Phosphatase: 109 U/L (ref 38–126)
Anion gap: 11 (ref 5–15)
BUN: 16 mg/dL (ref 8–23)
CO2: 21 mmol/L — ABNORMAL LOW (ref 22–32)
Calcium: 9.5 mg/dL (ref 8.9–10.3)
Chloride: 106 mmol/L (ref 98–111)
Creatinine, Ser: 0.55 mg/dL — ABNORMAL LOW (ref 0.61–1.24)
GFR calc Af Amer: >60 mL/min (ref >60)
GFR calc non Af Amer: >60 mL/min (ref >60)
Glucose, Bld: 113 mg/dL — ABNORMAL HIGH (ref 70–99)
Potassium: 4 mmol/L (ref 3.5–5.1)
Sodium: 138 mmol/L (ref 135–145)
Total Bilirubin: 1.3 mg/dL — ABNORMAL HIGH (ref 0.3–1.2)
Total Protein: 7.8 g/dL (ref 6.5–8.1)

## 2019-04-13 LAB — MAGNESIUM: Magnesium: 2 mg/dL (ref 1.7–2.4)

## 2019-04-13 LAB — CARBAPENEM RESISTANCE PANEL
Carba Resistance IMP Gene: NOT DETECTED
Carba Resistance KPC Gene: NOT DETECTED
Carba Resistance NDM Gene: NOT DETECTED
Carba Resistance OXA48 Gene: NOT DETECTED
Carba Resistance VIM Gene: NOT DETECTED

## 2019-04-13 LAB — PSA: Prostatic Specific Antigen: 1.95 ng/mL (ref 0.00–4.00)

## 2019-04-13 LAB — URINE CULTURE: Culture: >=100000 — AB

## 2019-04-13 NOTE — Progress Notes (Signed)
Pt here for follow up, reports continued leg pain, no appetite. States the medication that he was given to help with his appetite did not help.

## 2019-04-14 NOTE — Progress Notes (Signed)
Brief Pharmacy Note  Patient is a 63 y/o M with medical history of metastatic prostate cancer, urinary retention s/p suprapubic catheter who presented to [facility] ED 12/13 with chief complaint of fall. Reported normal function of suprapubic catheter. ROS negative for dysuria, fevers, chills. No evidence of infectious process per chart. He was not discharged on antibiotics. Urine culture from ED visit has resulted >100k colonies/mL Klebsiella oxytoca and 80k colonies/mL Morganella morganii. Spoke with EDP regarding culture results. Given lack of genitourinary symptoms and no evidence of infection, this result likely reflects colonization of the suprapubic catheter. Will not pursue antibiotic treatment.  [place] Resident 14 April 2019

## 2019-04-17 ENCOUNTER — Telehealth: Payer: Self-pay

## 2019-04-17 NOTE — Telephone Encounter (Signed)
WRITTEN ORDER SIGNED AND FAXED BACK TO [facility] ON 04-17-19.

## 2019-05-02 MED FILL — predniSONE 5 MG TABS: 5 | 30 days supply | Qty: 30 | Fill #2

## 2019-05-02 MED FILL — ABIRATERONE ACETATE 250 MG: 250 | 30 days supply | Qty: 120 | Fill #2

## 2019-05-31 ENCOUNTER — Telehealth: Payer: Self-pay

## 2019-05-31 ENCOUNTER — Other Ambulatory Visit: Payer: Self-pay | Admitting: Adult Health

## 2019-05-31 DIAGNOSIS — R609 Edema, unspecified: Secondary | ICD-10-CM

## 2019-05-31 NOTE — Telephone Encounter (Signed)
HOME HEALTH ORDER SIGNED AND PLACED IN [facility] FOLDER.

## 2019-06-01 MED FILL — ABIRATERONE ACETATE 250 MG: 250 | 30 days supply | Qty: 120 | Fill #3

## 2019-06-01 MED FILL — predniSONE 5 MG TABS: 5 | 30 days supply | Qty: 30 | Fill #3

## 2019-06-20 ENCOUNTER — Telehealth: Payer: Self-pay

## 2019-06-20 NOTE — Telephone Encounter (Signed)
Confirmed telephone visit on 06/22/2019. klh

## 2019-06-22 ENCOUNTER — Ambulatory Visit (INDEPENDENT_AMBULATORY_CARE_PROVIDER_SITE_OTHER): Payer: Medicare HMO | Admitting: Adult Health

## 2019-06-22 ENCOUNTER — Telehealth: Payer: Self-pay

## 2019-06-22 ENCOUNTER — Encounter: Payer: Self-pay | Admitting: Adult Health

## 2019-06-22 VITALS — Resp 16 | Ht 70.0 in | Wt 236.0 lb

## 2019-06-22 DIAGNOSIS — F17200 Nicotine dependence, unspecified, uncomplicated: Secondary | ICD-10-CM

## 2019-06-22 DIAGNOSIS — I10 Essential (primary) hypertension: Secondary | ICD-10-CM | POA: Diagnosis not present

## 2019-06-22 DIAGNOSIS — R339 Retention of urine, unspecified: Secondary | ICD-10-CM

## 2019-06-22 DIAGNOSIS — C419 Malignant neoplasm of bone and articular cartilage, unspecified: Secondary | ICD-10-CM

## 2019-06-22 DIAGNOSIS — C61 Malignant neoplasm of prostate: Secondary | ICD-10-CM

## 2019-06-22 DIAGNOSIS — N3289 Other specified disorders of bladder: Secondary | ICD-10-CM | POA: Diagnosis not present

## 2019-06-22 DIAGNOSIS — R609 Edema, unspecified: Secondary | ICD-10-CM

## 2019-06-22 MED ORDER — FUROSEMIDE 20 MG PO TABS: ORAL_TABLET | ORAL | 3 refills | Status: DC

## 2019-06-22 MED ORDER — OXYBUTYNIN CHLORIDE 5 MG PO TABS: 5.0000 mg | ORAL_TABLET | Freq: Four times a day (QID) | ORAL | 6 refills | Status: DC

## 2019-06-22 NOTE — Progress Notes (Signed)
[facility] [place], [place] [address]  Internal MEDICINE  Telephone Visit  Patient Name: Gerald Odonnell  [id number]  [id number]  Date of Service: 06/22/2019  I connected with the patient at 835 by telephone and verified the patients identity using two identifiers.   I discussed the limitations, risks, security and privacy concerns of performing an evaluation and management service by telephone and the availability of in person appointments. I also discussed with the patient that there may be a patient responsible charge related to the service.  The patient expressed understanding and agrees to proceed.    Chief Complaint  Patient presents with  . Telephone Assessment    feet swelling   . Telephone Screen  . Follow-up  . Hypertension    HPI  Pt is seen via telephone. Pt has a history of HTN, Prostate cancer with mets to bone.  He has a suprapubic catheter that home health comes to maintain.  He reports he is doing well,.  He has cut down his cigarettes and is only smoking one pack per week.  He reports some occasional alcohol use, but states he is unable to afford it often. He denies any recent hospitalizations.  He does report his feet have some slight swelling.  He takes lasix daily.      Current Medication: Outpatient Encounter Medications as of 06/22/2019  Medication Sig  . abiraterone acetate (ZYTIGA) 250 MG tablet TAKE 4 TABLETS (1,000 MG TOTAL) BY MOUTH DAILY. TAKE ON AN EMPTY STOMACH 1 HOUR BEFORE OR 2 HOURS AFTER A MEAL.  . furosemide (LASIX) 20 MG tablet Take one tab by mouth daily, as needed for swelling  . megestrol (MEGACE) 40 MG tablet Take 1 tablet (40 mg total) by mouth daily.  Marland Kitchen oxybutynin (DITROPAN) 5 MG tablet Take 1 tablet (5 mg total) by mouth 4 (four) times daily.  Marland Kitchen oxyCODONE (OXY IR/ROXICODONE) 5 MG immediate release tablet Take 1 tablet (5 mg total) by mouth every 6 (six) hours as needed.  . predniSONE (DELTASONE) 5 MG tablet TAKE 1  TABLET BY MOUTH ONCE DAILY WITH BREAKFAST  . [DISCONTINUED] furosemide (LASIX) 20 MG tablet TAKE ONE TABLET EVERY DAY AS NEEDED FOR SWELLING  . [DISCONTINUED] oxybutynin (DITROPAN) 5 MG tablet Take 1 tablet (5 mg total) by mouth 4 (four) times daily.   No facility-administered encounter medications on file as of 06/22/2019.    Surgical History: Past Surgical History:  Procedure Laterality Date  . ABDOMINAL SURGERY    . COLON SURGERY    . CYSTOSCOPY WITH LITHOLAPAXY N/A 06/09/2017   Procedure: CYSTOSCOPY WITH LITHOLAPAXY;  Surgeon: Hollice Espy, MD;  Location: [facility] ORS;  Service: Urology;  Laterality: N/A;  . PROSTATE SURGERY  05/25/2016  . SUPRAPUBIC CATHETER INSERTION      Medical History: Past Medical History:  Diagnosis Date  . Cancer ([place])    colon  . Cancer ([place])    bone cancer  . Colon cancer ([place])    Hx of  . History of kidney stones   . Hypertension   . Prostate cancer ([place])   . Prostate cancer ([place])    Hx of   . S/P colon resection   . Shortness of breath dyspnea   . Suprapubic catheter ([place])   . Urinary retention     Family History: Family History  Problem Relation Age of Onset  . Hypertension Other   . Alcohol abuse Mother   . Heart attack Father   . Prostate  cancer Brother   . Breast cancer Sister     Social History   Socioeconomic History  . Marital status: Widowed    Spouse name: Not on file  . Number of children: Not on file  . Years of education: Not on file  . Highest education level: Not on file  Occupational History  . Not on file  Tobacco Use  . Smoking status: Current Every Day Smoker    Packs/day: 0.25    Years: 40.00    Pack years: 10.00    Types: Cigarettes  . Smokeless tobacco: Never Used  . Tobacco comment: 1pack a week  Substance and Sexual Activity  . Alcohol use: Yes  . Drug use: No  . Sexual activity: Not on file  Other Topics Concern  . Not on file  Social History Narrative   ** Merged History Encounter **        Social Determinants of Health   Financial Resource Strain:   . Difficulty of Paying Living Expenses: Not on file  Food Insecurity:   . Worried About Charity fundraiser in the Last Year: Not on file  . Ran Out of Food in the Last Year: Not on file  Transportation Needs:   . Lack of Transportation (Medical): Not on file  . Lack of Transportation (Non-Medical): Not on file  Physical Activity:   . Days of Exercise per Week: Not on file  . Minutes of Exercise per Session: Not on file  Stress:   . Feeling of Stress : Not on file  Social Connections:   . Frequency of Communication with Friends and Family: Not on file  . Frequency of Social Gatherings with Friends and Family: Not on file  . Attends Religious Services: Not on file  . Active Member of Clubs or Organizations: Not on file  . Attends Archivist Meetings: Not on file  . Marital Status: Not on file  Intimate Partner Violence:   . Fear of Current or Ex-Partner: Not on file  . Emotionally Abused: Not on file  . Physically Abused: Not on file  . Sexually Abused: Not on file      Review of Systems  Constitutional: Negative.  Negative for chills, fatigue and unexpected weight change.  HENT: Negative.  Negative for congestion, rhinorrhea, sneezing and sore throat.   Eyes: Negative for redness.  Respiratory: Negative.  Negative for cough, chest tightness and shortness of breath.   Cardiovascular: Negative.  Negative for chest pain and palpitations.  Gastrointestinal: Negative.  Negative for abdominal pain, constipation, diarrhea, nausea and vomiting.  Endocrine: Negative.   Genitourinary: Negative.  Negative for dysuria and frequency.  Musculoskeletal: Negative.  Negative for arthralgias, back pain, joint swelling and neck pain.  Skin: Negative.  Negative for rash.  Allergic/Immunologic: Negative.   Neurological: Negative.  Negative for tremors and numbness.  Hematological: Negative for adenopathy. Does not  bruise/bleed easily.  Psychiatric/Behavioral: Negative.  Negative for behavioral problems, sleep disturbance and suicidal ideas. The patient is not nervous/anxious.     Vital Signs: Resp 16   Ht 5\' 10"  (1.778 m)   Wt 236 lb (107 kg)   BMI 33.86 kg/m    Observation/Objective:  Well sounding, NAD notd.    Assessment/Plan: 1. Benign hypertension Stable, continue present management.   2. Edema, unspecified type Refilled Lasix, continue for edema.  S[facility] to [facility] to draw in patients home.  - furosemide (LASIX) 20 MG tablet; Take one tab by mouth daily,  as needed for swelling  Dispense: 30 tablet; Refill: 3  3. Incomplete bladder emptying Continue with home health management.  Suprapubic catheter in place.   4. Bladder spasms Continue to use Ditropan as directed.  Good results.  - oxybutynin (DITROPAN) 5 MG tablet; Take 1 tablet (5 mg total) by mouth 4 (four) times daily.  Dispense: 120 tablet; Refill: 6  5. Nicotine dependence with current use Smoking cessation counseling: 1. Pt acknowledges the risks of long term smoking, she will try to quite smoking. 2. Options for different medications including nicotine products, chewing gum, patch etc, Wellbutrin and Chantix is discussed 3. Goal and date of compete cessation is discussed 4. Total time spent in smoking cessation is 15 min.  6. Malignant neoplasm of bone, unspecified location [facility]) Continue to follow up with oncology as directed.   7. Prostate cancer ([place]) Continue to see Oncologist as prescribed.   General Counseling: kota clendenon understanding of the findings of today's phone visit and agrees with plan of treatment. I have discussed any further diagnostic evaluation that may be needed or ordered today. We also reviewed his medications today. he has been encouraged to call the office with any questions or concerns that should arise related to todays visit.    No orders of the defined types were  placed in this encounter.   Meds ordered this encounter  Medications  . oxybutynin (DITROPAN) 5 MG tablet    Sig: Take 1 tablet (5 mg total) by mouth 4 (four) times daily.    Dispense:  120 tablet    Refill:  6  . furosemide (LASIX) 20 MG tablet    Sig: Take one tab by mouth daily, as needed for swelling    Dispense:  30 tablet    Refill:  3    Time spent: 20 Minutes    Orson Gear AGNP-C Internal medicine

## 2019-06-22 NOTE — Telephone Encounter (Signed)
[facility] CALLED BECAUSE WE FAXED LAB ORDERS TO THEM AND THEY WERE UNABLE TO SEE THEM. GAVE A VERBAL ORDER OK PER ADAM OF THE LAB ORDERS THEY NEED. GAVE ORDERS FOR CMP, LIPID PANEL W/ LDL/HDL RATIO, THYROXINE (T4), FREE, AND TSH 3RD GEN. DX Z00.01.

## 2019-07-03 ENCOUNTER — Other Ambulatory Visit: Payer: Self-pay | Admitting: Oncology

## 2019-07-03 DIAGNOSIS — C61 Malignant neoplasm of prostate: Secondary | ICD-10-CM

## 2019-07-07 NOTE — Progress Notes (Signed)
[place]  [phone] [phone]  ID: Gerald Odonnell OB: 09/09/1955  MR#: [id number]  [id number]  Patient Care Team: Lavera Guise, MD as PCP - General (Internal Medicine) Ronnell Freshwater, NP (Family Medicine)  CHIEF COMPLAINT: Prostate cancer metastatic to bone  INTERVAL HISTORY: Patient returns to clinic today for repeat laboratory work and routine 40-month evaluation.  He continues to tolerate Zytiga and prednisone without significant side effects.  Patient continues to complain of decreased appetite, but his weight has remained relatively stable. He continues to have urinary retention and has a suprapubic tube.  He has no neurologic complaints.  He denies any recent fevers or illnesses. He denies any pain.  He denies any chest pain, shortness of breath, cough, or hemoptysis.  He denies any nausea, vomiting, constipation, or diarrhea.  Patient offers no further specific complaints today.  REVIEW OF SYSTEMS:   Review of Systems  Constitutional: Negative.  Negative for fever, malaise/fatigue and weight loss.  Respiratory: Negative.  Negative for cough, hemoptysis and shortness of breath.   Cardiovascular: Negative.  Negative for chest pain and leg swelling.  Gastrointestinal: Negative.  Negative for abdominal pain, blood in stool and melena.  Genitourinary: Negative.  Negative for dysuria and hematuria.  Musculoskeletal: Negative.  Negative for back pain.  Skin: Negative.  Negative for rash.  Neurological: Negative.  Negative for focal weakness, weakness and headaches.  Psychiatric/Behavioral: Negative.  The patient is not nervous/anxious.     As per HPI. Otherwise, a complete review of systems is negative.  PAST MEDICAL HISTORY: Past Medical History:  Diagnosis Date  . Cancer ([place])    colon  . Cancer ([place])    bone cancer  . Colon cancer ([place])    Hx of  . History of kidney stones   . Hypertension   . Prostate cancer ([place])   .  Prostate cancer ([place])    Hx of   . S/P colon resection   . Shortness of breath dyspnea   . Suprapubic catheter ([place])   . Urinary retention     PAST SURGICAL HISTORY: Past Surgical History:  Procedure Laterality Date  . ABDOMINAL SURGERY    . COLON SURGERY    . CYSTOSCOPY WITH LITHOLAPAXY N/A 06/09/2017   Procedure: CYSTOSCOPY WITH LITHOLAPAXY;  Surgeon: Hollice Espy, MD;  Location: [facility] ORS;  Service: Urology;  Laterality: N/A;  . PROSTATE SURGERY  05/25/2016  . SUPRAPUBIC CATHETER INSERTION      FAMILY HISTORY: Family History  Problem Relation Age of Onset  . Hypertension Other   . Alcohol abuse Mother   . Heart attack Father   . Prostate cancer Brother   . Breast cancer Sister     ADVANCED DIRECTIVES (Y/N):  N  HEALTH MAINTENANCE: Social History   Tobacco Use  . Smoking status: Current Every Day Smoker    Packs/day: 0.25    Years: 40.00    Pack years: 10.00    Types: Cigarettes  . Smokeless tobacco: Never Used  . Tobacco comment: 1pack a week  Substance Use Topics  . Alcohol use: Yes  . Drug use: No     Colonoscopy:  PAP:  Bone density:  Lipid panel:  No Known Allergies  Current Outpatient Medications  Medication Sig Dispense Refill  . abiraterone acetate (ZYTIGA) 250 MG tablet TAKE 4 TABLETS (1,000 MG TOTAL) BY MOUTH DAILY. TAKE ON AN EMPTY STOMACH 1 HOUR BEFORE OR 2 HOURS AFTER A MEAL. 120 tablet 3  . furosemide (  LASIX) 20 MG tablet Take one tab by mouth daily, as needed for swelling 30 tablet 3  . oxybutynin (DITROPAN) 5 MG tablet Take 1 tablet (5 mg total) by mouth 4 (four) times daily. 120 tablet 6  . predniSONE (DELTASONE) 5 MG tablet TAKE 1 TABLET BY MOUTH ONCE DAILY WITH BREAKFAST 30 tablet 3   No current facility-administered medications for this visit.    OBJECTIVE: Vitals:   07/14/19 1102  BP: 129/79  Pulse: 98  Temp: (!) 97 F (36.1 C)  SpO2: 99%     Body mass index is 33.83 kg/m.    ECOG FS:0 - Asymptomatic   General:  Well-developed, well-nourished, no acute distress. Eyes: Pink conjunctiva, anicteric sclera. HEENT: Normocephalic, moist mucous membranes. Lungs: No audible wheezing or coughing. Heart: Regular rate and rhythm. Abdomen: Soft, nontender, no obvious distention.  Suprapubic catheter noted. Musculoskeletal: No edema, cyanosis, or clubbing. Neuro: Alert, answering all questions appropriately. Cranial nerves grossly intact. Skin: No rashes or petechiae noted. Psych: Normal affect.   LAB RESULTS:  Lab Results  Component Value Date   NA 136 07/14/2019   K 3.9 07/14/2019   CL 103 07/14/2019   CO2 23 07/14/2019   GLUCOSE 126 (H) 07/14/2019   BUN 10 07/14/2019   CREATININE 0.65 07/14/2019   CALCIUM 9.0 07/14/2019   PROT 7.4 07/14/2019   ALBUMIN 3.9 07/14/2019   AST 26 07/14/2019   ALT 19 07/14/2019   ALKPHOS 96 07/14/2019   BILITOT 1.0 07/14/2019   GFRNONAA >60 07/14/2019   GFRAA >60 07/14/2019    Lab Results  Component Value Date   WBC 8.3 07/14/2019   NEUTROABS 6.4 07/14/2019   HGB 15.0 07/14/2019   HCT 42.1 07/14/2019   MCV 102.4 (H) 07/14/2019   PLT 121 (L) 07/14/2019     STUDIES: No results found.  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 In 2017 upon diagnosis, he reportedly received XRT along with brachii therapy.  He last received Lupron in September 2018.  Patient states he had allergic reaction to Lupron and refuses to take any further injections.  He also was given a prescription for Casodex by urology, but has refused to take this as well.  He states he will never take any chemotherapy, but reluctantly agreed to initiate Zytiga plus prednisone.  Typically in castrate sensitive disease should be treated with both Zytiga and  Lupron, but patient is refusing injections as above.  Patient is tolerating his treatments well without significant side effects.  His PSA continues to trend down and is now 1.95, today's result is pending.  Continue Zytiga and prednisone indefinitely or until progression of disease. Return to clinic in 3 months with repeat laboratory work and further evaluation. 2.  Suprapubic catheter: Patient states he has home health to manage it.  He does not want to return to urology at this time. 3.  Poor appetite: Chronic and unchanged.  Patient previously declined GI or dietary consultation. 4.  Hyperbilirubinemia: Resolved.   5.  Thrombocytopenia: Chronic and unchanged.  Patient's platelet count is 121 today.  Patient expressed understanding and was in agreement with this plan. He also understands that He can call clinic at any time with any questions, concerns, or complaints.   Cancer Staging Prostate cancer (  [place]) Staging form: Prostate, AJCC 8th Edition - Clinical stage from 03/04/2018: Stage IVB (cT3a, cN0, cM1b, Grade Group: 5) - Signed by Lloyd Huger, MD on 03/04/2018   Lloyd Huger, MD   07/14/2019 3:33 PM

## 2019-07-10 MED FILL — ABIRATERONE ACETATE 250 MG: 250 | 30 days supply | Qty: 120 | Fill #0

## 2019-07-10 MED FILL — predniSONE 5 MG TABS: 5 | 30 days supply | Qty: 30 | Fill #0

## 2019-07-13 ENCOUNTER — Other Ambulatory Visit: Payer: Self-pay

## 2019-07-13 NOTE — Progress Notes (Signed)
Patient prescreened for appointment. Patient has no concerns or questions.  

## 2019-07-14 ENCOUNTER — Inpatient Hospital Stay (HOSPITAL_BASED_OUTPATIENT_CLINIC_OR_DEPARTMENT_OTHER): Payer: Medicare HMO | Admitting: Oncology

## 2019-07-14 ENCOUNTER — Inpatient Hospital Stay: Payer: Medicare HMO | Attending: Oncology

## 2019-07-14 ENCOUNTER — Other Ambulatory Visit: Payer: Self-pay

## 2019-07-14 VITALS — BP 129/79 | HR 98 | Temp 97.0°F | Wt 235.8 lb

## 2019-07-14 DIAGNOSIS — I10 Essential (primary) hypertension: Secondary | ICD-10-CM | POA: Diagnosis not present

## 2019-07-14 DIAGNOSIS — F17210 Nicotine dependence, cigarettes, uncomplicated: Secondary | ICD-10-CM | POA: Insufficient documentation

## 2019-07-14 DIAGNOSIS — C7951 Secondary malignant neoplasm of bone: Secondary | ICD-10-CM | POA: Diagnosis not present

## 2019-07-14 DIAGNOSIS — R630 Anorexia: Secondary | ICD-10-CM | POA: Diagnosis not present

## 2019-07-14 DIAGNOSIS — R339 Retention of urine, unspecified: Secondary | ICD-10-CM | POA: Diagnosis not present

## 2019-07-14 DIAGNOSIS — Z7952 Long term (current) use of systemic steroids: Secondary | ICD-10-CM | POA: Insufficient documentation

## 2019-07-14 DIAGNOSIS — D696 Thrombocytopenia, unspecified: Secondary | ICD-10-CM | POA: Diagnosis not present

## 2019-07-14 DIAGNOSIS — C61 Malignant neoplasm of prostate: Secondary | ICD-10-CM

## 2019-07-14 LAB — CBC WITH DIFFERENTIAL/PLATELET
Abs Immature Granulocytes: 0.03 10*3/uL (ref 0.00–0.07)
Basophils Absolute: 0.1 10*3/uL (ref 0.0–0.1)
Basophils Relative: 1 %
Eosinophils Absolute: 0 10*3/uL (ref 0.0–0.5)
Eosinophils Relative: 0 %
HCT: 42.1 % (ref 39.0–52.0)
Hemoglobin: 15 g/dL (ref 13.0–17.0)
Immature Granulocytes: 0 %
Lymphocytes Relative: 14 %
Lymphs Abs: 1.2 10*3/uL (ref 0.7–4.0)
MCH: 36.5 pg — ABNORMAL HIGH (ref 26.0–34.0)
MCHC: 35.6 g/dL (ref 30.0–36.0)
MCV: 102.4 fL — ABNORMAL HIGH (ref 80.0–100.0)
Monocytes Absolute: 0.6 10*3/uL (ref 0.1–1.0)
Monocytes Relative: 8 %
Neutro Abs: 6.4 10*3/uL (ref 1.7–7.7)
Neutrophils Relative %: 77 %
Platelets: 121 10*3/uL — ABNORMAL LOW (ref 150–400)
RBC: 4.11 MIL/uL — ABNORMAL LOW (ref 4.22–5.81)
RDW: 13 % (ref 11.5–15.5)
WBC: 8.3 10*3/uL (ref 4.0–10.5)
nRBC: 0 % (ref 0.0–0.2)

## 2019-07-14 LAB — COMPREHENSIVE METABOLIC PANEL
ALT: 19 U/L (ref 0–44)
AST: 26 U/L (ref 15–41)
Albumin: 3.9 g/dL (ref 3.5–5.0)
Alkaline Phosphatase: 96 U/L (ref 38–126)
Anion gap: 10 (ref 5–15)
BUN: 10 mg/dL (ref 8–23)
CO2: 23 mmol/L (ref 22–32)
Calcium: 9 mg/dL (ref 8.9–10.3)
Chloride: 103 mmol/L (ref 98–111)
Creatinine, Ser: 0.65 mg/dL (ref 0.61–1.24)
GFR calc Af Amer: >60 mL/min (ref >60)
GFR calc non Af Amer: >60 mL/min (ref >60)
Glucose, Bld: 126 mg/dL — ABNORMAL HIGH (ref 70–99)
Potassium: 3.9 mmol/L (ref 3.5–5.1)
Sodium: 136 mmol/L (ref 135–145)
Total Bilirubin: 1 mg/dL (ref 0.3–1.2)
Total Protein: 7.4 g/dL (ref 6.5–8.1)

## 2019-07-14 LAB — PHOSPHORUS: Phosphorus: 2.8 mg/dL (ref 2.5–4.6)

## 2019-07-14 LAB — PSA: Prostatic Specific Antigen: 1.58 ng/mL (ref 0.00–4.00)

## 2019-07-14 LAB — MAGNESIUM: Magnesium: 1.9 mg/dL (ref 1.7–2.4)

## 2019-08-09 ENCOUNTER — Telehealth: Payer: Self-pay

## 2019-08-09 MED FILL — ABIRATERONE ACETATE 250 MG: 250 | 30 days supply | Qty: 120 | Fill #1

## 2019-08-09 MED FILL — predniSONE 5 MG TABS: 5 | 30 days supply | Qty: 30 | Fill #1

## 2019-08-09 NOTE — Telephone Encounter (Signed)
[facility] faxed Urinalysis result as per adam faxed urologist and also spoke with staff that we faxing labs and advise pt need to contact his urological for antibiotic may need appt an also advised haley from [facility]  Any thing regarding urine they need to contact [facility]

## 2019-08-09 NOTE — Telephone Encounter (Signed)
FAXED SIGNED PHYSICIAN ORDER FOR LABS BACK TO [facility] [place].

## 2019-08-11 ENCOUNTER — Other Ambulatory Visit: Payer: Self-pay

## 2019-09-13 ENCOUNTER — Telehealth: Payer: Self-pay | Admitting: Physician Assistant

## 2019-09-13 NOTE — Telephone Encounter (Signed)
We recently received a faxed urinalysis dated 08/07/2019, presumably performed by home health, which showed pyuria, bacteriuria, and microscopic hematuria.  No additional information provided to provide clinical context for this.  These findings may be consistent with chronic indwelling suprapubic catheter versus acute urinary infection.  Please contact the patient and ask if he is having any bladder pain, fever, chills, nausea, or vomiting at this time.  If so, I recommend that he be seen in our clinic for UA and culture.  Additionally, he remains overdue for cystoscopy with Dr. Erlene Quan.  If he is asymptomatic at this time, I do not recommend treatment for these UA findings.  Regardless, please offer him an appointment for cystoscopy with Dr. Erlene Quan.

## 2019-09-15 ENCOUNTER — Telehealth: Payer: Self-pay

## 2019-09-15 NOTE — Telephone Encounter (Signed)
Contacted patient in regards to the faxed urinalysis received 08/07/2019. Patient states that his urine has since been cleared.  Patient denies any fever, chills, nausea, vomiting or bladder pain. Patient states his urine is "almost clear as water." I offered several times to make patient appt with Dr Erlene Quan for cystoscopy, patient declined each time stating he cannot afford to pay to get transportation to and from his appt., as he is on a very fixed limited income.

## 2019-09-15 NOTE — Telephone Encounter (Signed)
Confirmed appointment on 09/19/2019 and screened for covid. [facility] 

## 2019-09-19 ENCOUNTER — Telehealth: Payer: Self-pay

## 2019-09-19 ENCOUNTER — Encounter: Payer: Self-pay | Admitting: Adult Health

## 2019-09-19 ENCOUNTER — Ambulatory Visit (INDEPENDENT_AMBULATORY_CARE_PROVIDER_SITE_OTHER): Payer: Medicare HMO | Admitting: Adult Health

## 2019-09-19 ENCOUNTER — Other Ambulatory Visit: Payer: Self-pay

## 2019-09-19 VITALS — BP 134/89 | HR 98 | Temp 97.7°F | Resp 16 | Ht 70.0 in | Wt 237.0 lb

## 2019-09-19 DIAGNOSIS — F17200 Nicotine dependence, unspecified, uncomplicated: Secondary | ICD-10-CM

## 2019-09-19 DIAGNOSIS — R339 Retention of urine, unspecified: Secondary | ICD-10-CM | POA: Diagnosis not present

## 2019-09-19 DIAGNOSIS — I10 Essential (primary) hypertension: Secondary | ICD-10-CM

## 2019-09-19 DIAGNOSIS — C419 Malignant neoplasm of bone and articular cartilage, unspecified: Secondary | ICD-10-CM | POA: Diagnosis not present

## 2019-09-19 DIAGNOSIS — S81801A Unspecified open wound, right lower leg, initial encounter: Secondary | ICD-10-CM

## 2019-09-19 MED ORDER — SULFAMETHOXAZOLE-TRIMETHOPRIM 400-80 MG PO TABS: 1.0000 | ORAL_TABLET | Freq: Two times a day (BID) | ORAL | 0 refills | Status: DC

## 2019-09-19 NOTE — Telephone Encounter (Signed)
Gave verbal to [facility] [id number] for wound care once a week and as per adam wound type is abrasion and also to use calcium alginate

## 2019-09-19 NOTE — Progress Notes (Signed)
[facility] [place], [place] [address]  Internal MEDICINE  Office Visit Note  Patient Name: Gerald Odonnell  [id number]  [id number]  Date of Service: 09/19/2019  Chief Complaint  Patient presents with  . Follow-up    righ leg has sore on it   . Hypertension    HPI  Pt is here for follow up on HTN. BP well controlled at this time. He does complain of ongoing Right lower leg tibial open wound, redness, clear drainage. Bleeding noted with bandage removal.   Current Medication: Outpatient Encounter Medications as of 09/19/2019  Medication Sig  . abiraterone acetate (ZYTIGA) 250 MG tablet TAKE 4 TABLETS (1,000 MG TOTAL) BY MOUTH DAILY. TAKE ON AN EMPTY STOMACH 1 HOUR BEFORE OR 2 HOURS AFTER A MEAL.  . furosemide (LASIX) 20 MG tablet Take one tab by mouth daily, as needed for swelling  . oxybutynin (DITROPAN) 5 MG tablet Take 1 tablet (5 mg total) by mouth 4 (four) times daily.  . predniSONE (DELTASONE) 5 MG tablet TAKE 1 TABLET BY MOUTH ONCE DAILY WITH BREAKFAST   No facility-administered encounter medications on file as of 09/19/2019.    Surgical History: Past Surgical History:  Procedure Laterality Date  . ABDOMINAL SURGERY    . COLON SURGERY    . CYSTOSCOPY WITH LITHOLAPAXY N/A 06/09/2017   Procedure: CYSTOSCOPY WITH LITHOLAPAXY;  Surgeon: Hollice Espy, MD;  Location: [facility] ORS;  Service: Urology;  Laterality: N/A;  . PROSTATE SURGERY  05/25/2016  . SUPRAPUBIC CATHETER INSERTION      Medical History: Past Medical History:  Diagnosis Date  . Cancer ([place])    colon  . Cancer ([place])    bone cancer  . Colon cancer ([place])    Hx of  . History of kidney stones   . Hypertension   . Prostate cancer ([place])   . Prostate cancer ([place])    Hx of   . S/P colon resection   . Shortness of breath dyspnea   . Suprapubic catheter ([place])   . Urinary retention     Family History: Family History  Problem Relation Age of Onset  . Hypertension Other   .  Alcohol abuse Mother   . Heart attack Father   . Prostate cancer Brother   . Breast cancer Sister     Social History   Socioeconomic History  . Marital status: Widowed    Spouse name: Not on file  . Number of children: Not on file  . Years of education: Not on file  . Highest education level: Not on file  Occupational History  . Not on file  Tobacco Use  . Smoking status: Current Every Day Smoker    Packs/day: 0.25    Years: 40.00    Pack years: 10.00    Types: Cigarettes  . Smokeless tobacco: Never Used  . Tobacco comment: 1pack a week  Substance and Sexual Activity  . Alcohol use: Yes  . Drug use: No  . Sexual activity: Not on file  Other Topics Concern  . Not on file  Social History Narrative   ** Merged History Encounter **       Social Determinants of Health   Financial Resource Strain:   . Difficulty of Paying Living Expenses:   Food Insecurity:   . Worried About Charity fundraiser in the Last Year:   . Arboriculturist in the Last Year:   Transportation Needs:   . Lack of Transportation (  Medical):   Marland Kitchen Lack of Transportation (Non-Medical):   Physical Activity:   . Days of Exercise per Week:   . Minutes of Exercise per Session:   Stress:   . Feeling of Stress :   Social Connections:   . Frequency of Communication with Friends and Family:   . Frequency of Social Gatherings with Friends and Family:   . Attends Religious Services:   . Active Member of Clubs or Organizations:   . Attends Archivist Meetings:   Marland Kitchen Marital Status:   Intimate Partner Violence:   . Fear of Current or Ex-Partner:   . Emotionally Abused:   Marland Kitchen Physically Abused:   . Sexually Abused:       Review of Systems  Skin: Positive for wound.    Vital Signs: BP 134/89   Pulse 98   Temp 97.7 F (36.5 C)   Resp 16   Ht 5\' 10"  (1.778 m)   Wt 237 lb (107.5 kg)   SpO2 96%   BMI 34.01 kg/m    Physical Exam Vitals and nursing note reviewed.  Constitutional:       General: He is not in acute distress.    Appearance: He is well-developed. He is not diaphoretic.  HENT:     Head: Normocephalic and atraumatic.     Mouth/Throat:     Pharynx: No oropharyngeal exudate.  Eyes:     Pupils: Pupils are equal, round, and reactive to light.  Neck:     Thyroid: No thyromegaly.     Vascular: No JVD.     Trachea: No tracheal deviation.  Cardiovascular:     Rate and Rhythm: Normal rate and regular rhythm.     Heart sounds: Normal heart sounds. No murmur. No friction rub. No gallop.   Pulmonary:     Effort: Pulmonary effort is normal. No respiratory distress.     Breath sounds: Normal breath sounds. No wheezing or rales.  Chest:     Chest wall: No tenderness.  Abdominal:     Palpations: Abdomen is soft.     Tenderness: There is no abdominal tenderness. There is no guarding.  Musculoskeletal:        General: Normal range of motion.     Cervical back: Normal range of motion and neck supple.  Lymphadenopathy:     Cervical: No cervical adenopathy.  Skin:    General: Skin is warm and dry.     Comments: RLE wound, bleeding, red, clear drainage.   Neurological:     Mental Status: He is alert and oriented to person, place, and time.     Cranial Nerves: No cranial nerve deficit.  Psychiatric:        Behavior: Behavior normal.        Thought Content: Thought content normal.        Judgment: Judgment normal.    Assessment/Plan: 1. Open wound of right lower leg, initial encounter Advised patient to take entire course of antibiotics as prescribed with food. Pt should return to clinic in 7-10 days if symptoms fail to improve or new symptoms develop. Home health add on orders for them to change bandage and keep watch on wound.  - sulfamethoxazole-trimethoprim (BACTRIM) 400-80 MG tablet; Take 1 tablet by mouth 2 (two) times daily.  Dispense: 14 tablet; Refill: 0  2. Benign hypertension Controlled, continue current therapy.  3. Incomplete bladder  emptying Continue to follow, see urology as needed.   4. Nicotine dependence with current use Smoking cessation  counseling: 1. Pt acknowledges the risks of long term smoking, she will try to quite smoking. 2. Options for different medications including nicotine products, chewing gum, patch etc, Wellbutrin and Chantix is discussed 3. Goal and date of compete cessation is discussed 4. Total time spent in smoking cessation is 15 min.   5. Malignant neoplasm of bone, unspecified location [facility]) Continue to follow up with oncology.   General Counseling: delfino semo understanding of the findings of todays visit and agrees with plan of treatment. I have discussed any further diagnostic evaluation that may be needed or ordered today. We also reviewed his medications today. he has been encouraged to call the office with any questions or concerns that should arise related to todays visit.    No orders of the defined types were placed in this encounter.   No orders of the defined types were placed in this encounter.   Time spent: 30 Minutes   This patient was seen by Orson Gear AGNP-C in Collaboration with Dr Lavera Guise as a part of collaborative care agreement     Kendell Bane AGNP-C Internal medicine

## 2019-09-27 ENCOUNTER — Ambulatory Visit: Payer: Medicare HMO | Admitting: Adult Health

## 2019-09-29 ENCOUNTER — Telehealth: Payer: Self-pay

## 2019-09-29 NOTE — Telephone Encounter (Signed)
Confirmed and screened for 10-03-19 ov. 

## 2019-10-03 ENCOUNTER — Encounter: Payer: Self-pay | Admitting: Adult Health

## 2019-10-03 ENCOUNTER — Ambulatory Visit (INDEPENDENT_AMBULATORY_CARE_PROVIDER_SITE_OTHER): Payer: Medicare HMO | Admitting: Adult Health

## 2019-10-03 VITALS — Resp 16 | Ht 70.0 in | Wt 236.0 lb

## 2019-10-03 DIAGNOSIS — F17200 Nicotine dependence, unspecified, uncomplicated: Secondary | ICD-10-CM | POA: Diagnosis not present

## 2019-10-03 DIAGNOSIS — S81801A Unspecified open wound, right lower leg, initial encounter: Secondary | ICD-10-CM

## 2019-10-03 DIAGNOSIS — R609 Edema, unspecified: Secondary | ICD-10-CM

## 2019-10-03 DIAGNOSIS — N3289 Other specified disorders of bladder: Secondary | ICD-10-CM

## 2019-10-03 DIAGNOSIS — I10 Essential (primary) hypertension: Secondary | ICD-10-CM

## 2019-10-03 MED ORDER — FUROSEMIDE 20 MG PO TABS: ORAL_TABLET | ORAL | 1 refills | Status: DC

## 2019-10-03 MED ORDER — OXYBUTYNIN CHLORIDE 5 MG PO TABS: 5.0000 mg | ORAL_TABLET | Freq: Four times a day (QID) | ORAL | 2 refills | Status: DC

## 2019-10-03 NOTE — Progress Notes (Signed)
[facility] [place], [place] [address]  Internal MEDICINE  Telephone Visit  Patient Name: Gerald Odonnell  [id number]  [id number]  Date of Service: 10/03/2019  I connected with the patient at 1224 by telephone and verified the patients identity using two identifiers.   I discussed the limitations, risks, security and privacy concerns of performing an evaluation and management service by telephone and the availability of in person appointments. I also discussed with the patient that there may be a patient responsible charge related to the service.  The patient expressed understanding and agrees to proceed.    Chief Complaint  Patient presents with  . Telephone Assessment  . Telephone Screen  . Hypertension    HPI  Pt is seen via telephone for follow up on his leg wound. He reports the home health nurse has been out to help him.  He has completed the bactrim.  He reports the wound is closed, and looking better. Home health will continue to see him once a week for treatment.      Current Medication: Outpatient Encounter Medications as of 10/03/2019  Medication Sig  . abiraterone acetate (ZYTIGA) 250 MG tablet TAKE 4 TABLETS (1,000 MG TOTAL) BY MOUTH DAILY. TAKE ON AN EMPTY STOMACH 1 HOUR BEFORE OR 2 HOURS AFTER A MEAL.  . furosemide (LASIX) 20 MG tablet Take one tab by mouth daily, as needed for swelling  . oxybutynin (DITROPAN) 5 MG tablet Take 1 tablet (5 mg total) by mouth 4 (four) times daily.  . predniSONE (DELTASONE) 5 MG tablet TAKE 1 TABLET BY MOUTH ONCE DAILY WITH BREAKFAST  . sulfamethoxazole-trimethoprim (BACTRIM) 400-80 MG tablet Take 1 tablet by mouth 2 (two) times daily.  . [DISCONTINUED] furosemide (LASIX) 20 MG tablet Take one tab by mouth daily, as needed for swelling  . [DISCONTINUED] oxybutynin (DITROPAN) 5 MG tablet Take 1 tablet (5 mg total) by mouth 4 (four) times daily.   No facility-administered encounter medications on file as of  10/03/2019.    Surgical History: Past Surgical History:  Procedure Laterality Date  . ABDOMINAL SURGERY    . COLON SURGERY    . CYSTOSCOPY WITH LITHOLAPAXY N/A 06/09/2017   Procedure: CYSTOSCOPY WITH LITHOLAPAXY;  Surgeon: Hollice Espy, MD;  Location: [facility] ORS;  Service: Urology;  Laterality: N/A;  . PROSTATE SURGERY  05/25/2016  . SUPRAPUBIC CATHETER INSERTION      Medical History: Past Medical History:  Diagnosis Date  . Cancer ([place])    colon  . Cancer ([place])    bone cancer  . Colon cancer ([place])    Hx of  . History of kidney stones   . Hypertension   . Prostate cancer ([place])   . Prostate cancer ([place])    Hx of   . S/P colon resection   . Shortness of breath dyspnea   . Suprapubic catheter ([place])   . Urinary retention     Family History: Family History  Problem Relation Age of Onset  . Hypertension Other   . Alcohol abuse Mother   . Heart attack Father   . Prostate cancer Brother   . Breast cancer Sister     Social History   Socioeconomic History  . Marital status: Widowed    Spouse name: Not on file  . Number of children: Not on file  . Years of education: Not on file  . Highest education level: Not on file  Occupational History  . Not on file  Tobacco Use  .  Smoking status: Current Every Day Smoker    Packs/day: 0.25    Years: 40.00    Pack years: 10.00    Types: Cigarettes  . Smokeless tobacco: Never Used  . Tobacco comment: 1pack a week  Substance and Sexual Activity  . Alcohol use: Yes  . Drug use: No  . Sexual activity: Not on file  Other Topics Concern  . Not on file  Social History Narrative   ** Merged History Encounter **       Social Determinants of Health   Financial Resource Strain:   . Difficulty of Paying Living Expenses:   Food Insecurity:   . Worried About Charity fundraiser in the Last Year:   . Arboriculturist in the Last Year:   Transportation Needs:   . Film/video editor (Medical):   Marland Kitchen Lack of Transportation  (Non-Medical):   Physical Activity:   . Days of Exercise per Week:   . Minutes of Exercise per Session:   Stress:   . Feeling of Stress :   Social Connections:   . Frequency of Communication with Friends and Family:   . Frequency of Social Gatherings with Friends and Family:   . Attends Religious Services:   . Active Member of Clubs or Organizations:   . Attends Archivist Meetings:   Marland Kitchen Marital Status:   Intimate Partner Violence:   . Fear of Current or Ex-Partner:   . Emotionally Abused:   Marland Kitchen Physically Abused:   . Sexually Abused:       Review of Systems  Constitutional: Negative.  Negative for chills, fatigue and unexpected weight change.  HENT: Negative.  Negative for congestion, rhinorrhea, sneezing and sore throat.   Eyes: Negative for redness.  Respiratory: Negative.  Negative for cough, chest tightness and shortness of breath.   Cardiovascular: Negative.  Negative for chest pain and palpitations.  Gastrointestinal: Negative.  Negative for abdominal pain, constipation, diarrhea, nausea and vomiting.  Endocrine: Negative.   Genitourinary: Negative.  Negative for dysuria and frequency.  Musculoskeletal: Negative.  Negative for arthralgias, back pain, joint swelling and neck pain.  Skin: Negative.  Negative for rash.  Allergic/Immunologic: Negative.   Neurological: Negative.  Negative for tremors and numbness.  Hematological: Negative for adenopathy. Does not bruise/bleed easily.  Psychiatric/Behavioral: Negative.  Negative for behavioral problems, sleep disturbance and suicidal ideas. The patient is not nervous/anxious.     Vital Signs: Resp 16   Ht 5\' 10"  (1.778 m)   Wt 236 lb (107 kg)   BMI 33.86 kg/m    Observation/Objective: Well sounding, nad noted.    Assessment/Plan: 1. Open wound of right lower leg, initial encounter Resolving, continue to follow home health nurse recommendations.   2. Benign hypertension Controlled, continue current  management.   3. Nicotine dependence with current use Smoking cessation counseling: 1. Pt acknowledges the risks of long term smoking, she will try to quite smoking. 2. Options for different medications including nicotine products, chewing gum, patch etc, Wellbutrin and Chantix is discussed 3. Goal and date of compete cessation is discussed 4. Total time spent in smoking cessation is 15 min.  4. Edema, unspecified type Continue lasix as discussed - furosemide (LASIX) 20 MG tablet; Take one tab by mouth daily, as needed for swelling  Dispense: 30 tablet; Refill: 1  5. Bladder spasms Continue ditropan as discussed.  - oxybutynin (DITROPAN) 5 MG tablet; Take 1 tablet (5 mg total) by mouth 4 (four) times daily.  Dispense: 120 tablet; Refill: 2  General Counseling: Gerald Odonnell understanding of the findings of today's phone visit and agrees with plan of treatment. I have discussed any further diagnostic evaluation that may be needed or ordered today. We also reviewed his medications today. he has been encouraged to call the office with any questions or concerns that should arise related to todays visit.    No orders of the defined types were placed in this encounter.   Meds ordered this encounter  Medications  . furosemide (LASIX) 20 MG tablet    Sig: Take one tab by mouth daily, as needed for swelling    Dispense:  30 tablet    Refill:  1  . oxybutynin (DITROPAN) 5 MG tablet    Sig: Take 1 tablet (5 mg total) by mouth 4 (four) times daily.    Dispense:  120 tablet    Refill:  2    Time spent: 36 Minutes    Dr Lavera Guise Internal medicine

## 2019-10-12 NOTE — Progress Notes (Signed)
[place]  Telephone:(336) [phone] Fax:(336) [phone]  ID: Gerald Odonnell OB: 1955-12-14  MR#: [id number]  BHA#:[id number]  Patient Care Team: Lavera Guise, MD as PCP - General (Internal Medicine) Ronnell Freshwater, NP (Family Medicine) Lloyd Huger, MD as Consulting Physician (Oncology)  CHIEF COMPLAINT: Prostate cancer metastatic to bone  INTERVAL HISTORY: Patient returns to clinic today for repeat laboratory work and routine 73-month evaluation.  He is tolerating Zytiga and prednisone well without significant side effects. He continues to have urinary retention and has a suprapubic tube.  He has no neurologic complaints.  He denies any recent fevers or illnesses. He denies any pain.  He denies any chest pain, shortness of breath, cough, or hemoptysis.  He denies any nausea, vomiting, constipation, or diarrhea.  Patient offers no further specific complaints today.  REVIEW OF SYSTEMS:   Review of Systems  Constitutional: Negative.  Negative for fever, malaise/fatigue and weight loss.  Respiratory: Negative.  Negative for cough, hemoptysis and shortness of breath.   Cardiovascular: Negative.  Negative for chest pain and leg swelling.  Gastrointestinal: Negative.  Negative for abdominal pain, blood in stool and melena.  Genitourinary: Negative.  Negative for dysuria and hematuria.  Musculoskeletal: Negative.  Negative for back pain.  Skin: Negative.  Negative for rash.  Neurological: Negative.  Negative for focal weakness, weakness and headaches.  Psychiatric/Behavioral: Negative.  The patient is not nervous/anxious.     As per HPI. Otherwise, a complete review of systems is negative.  PAST MEDICAL HISTORY: Past Medical History:  Diagnosis Date  . Cancer ([place])    colon  . Cancer ([place])    bone cancer  . Colon cancer ([place])    Hx of  . History of kidney stones   . Hypertension   . Prostate cancer ([place])   . Prostate cancer ([place])    Hx of   . S/P  colon resection   . Shortness of breath dyspnea   . Suprapubic catheter ([place])   . Urinary retention     PAST SURGICAL HISTORY: Past Surgical History:  Procedure Laterality Date  . ABDOMINAL SURGERY    . COLON SURGERY    . CYSTOSCOPY WITH LITHOLAPAXY N/A 06/09/2017   Procedure: CYSTOSCOPY WITH LITHOLAPAXY;  Surgeon: Hollice Espy, MD;  Location: [facility] ORS;  Service: Urology;  Laterality: N/A;  . PROSTATE SURGERY  05/25/2016  . SUPRAPUBIC CATHETER INSERTION      FAMILY HISTORY: Family History  Problem Relation Age of Onset  . Hypertension Other   . Alcohol abuse Mother   . Heart attack Father   . Prostate cancer Brother   . Breast cancer Sister     ADVANCED DIRECTIVES (Y/N):  N  HEALTH MAINTENANCE: Social History   Tobacco Use  . Smoking status: Current Every Day Smoker    Packs/day: 0.25    Years: 40.00    Pack years: 10.00    Types: Cigarettes  . Smokeless tobacco: Never Used  . Tobacco comment: 1pack a week  Vaping Use  . Vaping Use: Never used  Substance Use Topics  . Alcohol use: Yes  . Drug use: No     Colonoscopy:  PAP:  Bone density:  Lipid panel:  No Known Allergies  Current Outpatient Medications  Medication Sig Dispense Refill  . abiraterone acetate (ZYTIGA) 250 MG tablet TAKE 4 TABLETS (1,000 MG TOTAL) BY MOUTH DAILY. TAKE ON AN EMPTY STOMACH 1 HOUR BEFORE OR 2 HOURS AFTER A MEAL. 120 tablet 3  .  furosemide (LASIX) 20 MG tablet Take one tab by mouth daily, as needed for swelling 30 tablet 1  . oxybutynin (DITROPAN) 5 MG tablet Take 1 tablet (5 mg total) by mouth 4 (four) times daily. 120 tablet 2  . predniSONE (DELTASONE) 5 MG tablet TAKE 1 TABLET BY MOUTH ONCE DAILY WITH BREAKFAST 30 tablet 3  . sulfamethoxazole-trimethoprim (BACTRIM) 400-80 MG tablet Take 1 tablet by mouth 2 (two) times daily. 14 tablet 0   No current facility-administered medications for this visit.    OBJECTIVE: Vitals:   10/18/19 1127  BP: 120/86  Pulse: 98  Temp:  98.9 F (37.2 C)  SpO2: 97%     Body mass index is 33.78 kg/m.    ECOG FS:0 - Asymptomatic   General: Well-developed, well-nourished, no acute distress. Eyes: Pink conjunctiva, anicteric sclera. HEENT: Normocephalic, moist mucous membranes. Lungs: No audible wheezing or coughing. Heart: Regular rate and rhythm. Abdomen: Soft, nontender, no obvious distention. Musculoskeletal: No edema, cyanosis, or clubbing. Neuro: Alert, answering all questions appropriately. Cranial nerves grossly intact. Skin: No rashes or petechiae noted. Psych: Normal affect.   LAB RESULTS:  Lab Results  Component Value Date   NA 136 10/18/2019   K 3.7 10/18/2019   CL 102 10/18/2019   CO2 22 10/18/2019   GLUCOSE 107 (H) 10/18/2019   BUN 9 10/18/2019   CREATININE 0.63 10/18/2019   CALCIUM 9.1 10/18/2019   PROT 7.6 10/18/2019   ALBUMIN 4.0 10/18/2019   AST 33 10/18/2019   ALT 29 10/18/2019   ALKPHOS 105 10/18/2019   BILITOT 1.0 10/18/2019   GFRNONAA >60 10/18/2019   GFRAA >60 10/18/2019    Lab Results  Component Value Date   WBC 7.1 10/18/2019   NEUTROABS 4.7 10/18/2019   HGB 14.8 10/18/2019   HCT 40.9 10/18/2019   MCV 101.5 (H) 10/18/2019   PLT 98 (L) 10/18/2019     STUDIES: No results found.  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 In 2017 upon diagnosis, he reportedly received XRT along with brachii therapy.  He last received Lupron in September 2018.  Patient states he had allergic reaction to Lupron and refuses to take any further injections.  He also was given a prescription for Casodex by urology, but has refused to take this as well.  He states he will never take any chemotherapy, but reluctantly agreed to initiate Zytiga plus prednisone.   Typically in castrate sensitive disease should be treated with both Zytiga and Lupron, but patient is refusing injections as above.  Patient is tolerating his treatments well without significant side effects.  His PSA continues to trend down and is now 1.26.  Continue Zytiga and prednisone indefinitely or until progression of disease.  Return to clinic in 3 months with repeat laboratory work and routine evaluation.   2.  Suprapubic catheter: Patient states he has home health to manage it.  He does not want to return to urology at this time. 3.  Poor appetite: Improved.  Patient previously declined GI or dietary consultation. 4.  Hyperbilirubinemia: Resolved.   5.  Thrombocytopenia: Patient's platelet count has trended down to 98.  Monitor.  Patient expressed understanding and was in agreement with this plan. He also understands that He can call clinic at any time  with any questions, concerns, or complaints.   Cancer Staging Prostate cancer [facility]) Staging form: Prostate, AJCC 8th Edition - Clinical stage from 03/04/2018: Stage IVB (cT3a, cN0, cM1b, Grade Group: 5) - Signed by Lloyd Huger, MD on 03/04/2018   Lloyd Huger, MD   10/19/2019 6:57 AM

## 2019-10-18 ENCOUNTER — Other Ambulatory Visit: Payer: Self-pay

## 2019-10-18 ENCOUNTER — Inpatient Hospital Stay (HOSPITAL_BASED_OUTPATIENT_CLINIC_OR_DEPARTMENT_OTHER): Payer: Medicare HMO | Admitting: Oncology

## 2019-10-18 ENCOUNTER — Inpatient Hospital Stay: Payer: Medicare HMO | Attending: Oncology

## 2019-10-18 ENCOUNTER — Telehealth: Payer: Self-pay

## 2019-10-18 ENCOUNTER — Encounter: Payer: Self-pay | Admitting: Oncology

## 2019-10-18 VITALS — BP 120/86 | HR 98 | Temp 98.9°F | Wt 235.4 lb

## 2019-10-18 DIAGNOSIS — C7951 Secondary malignant neoplasm of bone: Secondary | ICD-10-CM | POA: Insufficient documentation

## 2019-10-18 DIAGNOSIS — Z8249 Family history of ischemic heart disease and other diseases of the circulatory system: Secondary | ICD-10-CM | POA: Diagnosis not present

## 2019-10-18 DIAGNOSIS — Z811 Family history of alcohol abuse and dependence: Secondary | ICD-10-CM | POA: Insufficient documentation

## 2019-10-18 DIAGNOSIS — I10 Essential (primary) hypertension: Secondary | ICD-10-CM | POA: Diagnosis not present

## 2019-10-18 DIAGNOSIS — Z79899 Other long term (current) drug therapy: Secondary | ICD-10-CM | POA: Diagnosis not present

## 2019-10-18 DIAGNOSIS — Z87442 Personal history of urinary calculi: Secondary | ICD-10-CM | POA: Diagnosis not present

## 2019-10-18 DIAGNOSIS — D696 Thrombocytopenia, unspecified: Secondary | ICD-10-CM | POA: Insufficient documentation

## 2019-10-18 DIAGNOSIS — F17210 Nicotine dependence, cigarettes, uncomplicated: Secondary | ICD-10-CM | POA: Diagnosis not present

## 2019-10-18 DIAGNOSIS — C61 Malignant neoplasm of prostate: Secondary | ICD-10-CM

## 2019-10-18 DIAGNOSIS — Z7952 Long term (current) use of systemic steroids: Secondary | ICD-10-CM | POA: Diagnosis not present

## 2019-10-18 DIAGNOSIS — Z6833 Body mass index (BMI) 33.0-33.9, adult: Secondary | ICD-10-CM | POA: Insufficient documentation

## 2019-10-18 DIAGNOSIS — Z8042 Family history of malignant neoplasm of prostate: Secondary | ICD-10-CM | POA: Insufficient documentation

## 2019-10-18 DIAGNOSIS — R339 Retention of urine, unspecified: Secondary | ICD-10-CM | POA: Insufficient documentation

## 2019-10-18 DIAGNOSIS — Z803 Family history of malignant neoplasm of breast: Secondary | ICD-10-CM | POA: Insufficient documentation

## 2019-10-18 LAB — CBC WITH DIFFERENTIAL/PLATELET
Abs Immature Granulocytes: 0.03 10*3/uL (ref 0.00–0.07)
Basophils Absolute: 0.1 10*3/uL (ref 0.0–0.1)
Basophils Relative: 1 %
Eosinophils Absolute: 0.1 10*3/uL (ref 0.0–0.5)
Eosinophils Relative: 1 %
HCT: 40.9 % (ref 39.0–52.0)
Hemoglobin: 14.8 g/dL (ref 13.0–17.0)
Immature Granulocytes: 0 %
Lymphocytes Relative: 22 %
Lymphs Abs: 1.5 10*3/uL (ref 0.7–4.0)
MCH: 36.7 pg — ABNORMAL HIGH (ref 26.0–34.0)
MCHC: 36.2 g/dL — ABNORMAL HIGH (ref 30.0–36.0)
MCV: 101.5 fL — ABNORMAL HIGH (ref 80.0–100.0)
Monocytes Absolute: 0.7 10*3/uL (ref 0.1–1.0)
Monocytes Relative: 10 %
Neutro Abs: 4.7 10*3/uL (ref 1.7–7.7)
Neutrophils Relative %: 66 %
Platelets: 98 10*3/uL — ABNORMAL LOW (ref 150–400)
RBC: 4.03 MIL/uL — ABNORMAL LOW (ref 4.22–5.81)
RDW: 12.9 % (ref 11.5–15.5)
WBC: 7.1 10*3/uL (ref 4.0–10.5)
nRBC: 0 % (ref 0.0–0.2)

## 2019-10-18 LAB — COMPREHENSIVE METABOLIC PANEL
ALT: 29 U/L (ref 0–44)
AST: 33 U/L (ref 15–41)
Albumin: 4 g/dL (ref 3.5–5.0)
Alkaline Phosphatase: 105 U/L (ref 38–126)
Anion gap: 12 (ref 5–15)
BUN: 9 mg/dL (ref 8–23)
CO2: 22 mmol/L (ref 22–32)
Calcium: 9.1 mg/dL (ref 8.9–10.3)
Chloride: 102 mmol/L (ref 98–111)
Creatinine, Ser: 0.63 mg/dL (ref 0.61–1.24)
GFR calc Af Amer: >60 mL/min (ref >60)
GFR calc non Af Amer: >60 mL/min (ref >60)
Glucose, Bld: 107 mg/dL — ABNORMAL HIGH (ref 70–99)
Potassium: 3.7 mmol/L (ref 3.5–5.1)
Sodium: 136 mmol/L (ref 135–145)
Total Bilirubin: 1 mg/dL (ref 0.3–1.2)
Total Protein: 7.6 g/dL (ref 6.5–8.1)

## 2019-10-18 LAB — PSA: Prostatic Specific Antigen: 1.26 ng/mL (ref 0.00–4.00)

## 2019-10-18 LAB — PHOSPHORUS: Phosphorus: 3.4 mg/dL (ref 2.5–4.6)

## 2019-10-18 LAB — MAGNESIUM: Magnesium: 2.1 mg/dL (ref 1.7–2.4)

## 2019-10-18 NOTE — Progress Notes (Signed)
Patient states he is having some pain in legs but denies other concerns at this time.

## 2019-10-18 NOTE — Telephone Encounter (Signed)
Physician order signed and faxed back to [facility]. Left in [facility] folder.

## 2019-10-31 ENCOUNTER — Other Ambulatory Visit: Payer: Self-pay | Admitting: Oncology

## 2019-10-31 DIAGNOSIS — C61 Malignant neoplasm of prostate: Secondary | ICD-10-CM

## 2019-10-31 MED FILL — predniSONE 5 MG TABS: 5 | 30 days supply | Qty: 30 | Fill #0

## 2019-11-02 MED FILL — ABIRATERONE ACETATE 250 MG: 250 | 30 days supply | Qty: 120 | Fill #0

## 2019-12-07 MED FILL — ABIRATERONE ACETATE 250 MG: 250 | 30 days supply | Qty: 120 | Fill #1

## 2019-12-07 MED FILL — predniSONE 5 MG TABS: 5 | 30 days supply | Qty: 30 | Fill #1

## 2019-12-28 ENCOUNTER — Ambulatory Visit: Payer: Medicare HMO | Admitting: Hospice and Palliative Medicine

## 2020-01-02 MED FILL — ABIRATERONE ACETATE 250 MG: 250 | 30 days supply | Qty: 120 | Fill #2

## 2020-01-02 MED FILL — predniSONE 5 MG TABS: 5 | 30 days supply | Qty: 30 | Fill #2

## 2020-01-05 ENCOUNTER — Telehealth: Payer: Self-pay

## 2020-01-05 ENCOUNTER — Other Ambulatory Visit: Payer: Self-pay | Admitting: Physician Assistant

## 2020-01-05 DIAGNOSIS — N3289 Other specified disorders of bladder: Secondary | ICD-10-CM

## 2020-01-05 MED ORDER — MIRABEGRON ER 25 MG PO TB24
25.0000 mg | ORAL_TABLET | Freq: Every day | ORAL | 3 refills | Status: DC
Start: 2020-01-05 — End: 2020-01-17

## 2020-01-05 NOTE — Telephone Encounter (Signed)
Incoming call from [place] at [facility] requesting additional options to manage patient's bladder spasms as oxybutynin is no longer effective.   Spoke with Debroah Loop, PA who states that patient can try Myrbetriq and d/c oxybutnin. RX sent in.  Called Benjamine Mola to inform her of this information. No answer. LM for her to call back.

## 2020-01-14 NOTE — Progress Notes (Signed)
Gerald Odonnell  [phone] [phone]  ID: Turner K Martinique OB: October 12, 1955  MR#: [id number]  AOZ#:[id number]  Patient Care Team: Lavera Guise, MD as PCP - General (Internal Medicine) Ronnell Freshwater, NP (Family Medicine) Lloyd Huger, MD as Consulting Physician (Oncology)  CHIEF COMPLAINT: Prostate cancer metastatic to bone  INTERVAL HISTORY: Patient returns to clinic today for repeat laboratory work and routine 67-month evaluation.  He is having spasms at the site of his suprapubic catheter, but otherwise feels well and is asymptomatic.  He continues to tolerate Zytiga and prednisone without significant side effects.  He has no neurologic complaints.  He denies any recent fevers or illnesses.  He denies any other pain.  He denies any chest pain, shortness of breath, cough, or hemoptysis.  He denies any nausea, vomiting, constipation, or diarrhea.  Patient offers no further specific complaints today.  REVIEW OF SYSTEMS:   Review of Systems  Constitutional: Negative.  Negative for fever, malaise/fatigue and weight loss.  Respiratory: Negative.  Negative for cough, hemoptysis and shortness of breath.   Cardiovascular: Negative.  Negative for chest pain and leg swelling.  Gastrointestinal: Negative.  Negative for abdominal pain, blood in stool and melena.  Genitourinary: Negative.  Negative for dysuria and hematuria.  Musculoskeletal: Negative.  Negative for back pain.  Skin: Negative.  Negative for rash.  Neurological: Negative.  Negative for focal weakness, weakness and headaches.  Psychiatric/Behavioral: Negative.  The patient is not nervous/anxious.     As per HPI. Otherwise, a complete review of systems is negative.  PAST MEDICAL HISTORY: Past Medical History:  Diagnosis Date  . Cancer ([place])    colon  . Cancer ([place])    bone cancer  . Colon cancer ([place])    Hx of  . History of kidney stones   . Hypertension   . Prostate cancer  ([place])   . Prostate cancer ([place])    Hx of   . S/P colon resection   . Shortness of breath dyspnea   . Suprapubic catheter ([place])   . Urinary retention     PAST SURGICAL HISTORY: Past Surgical History:  Procedure Laterality Date  . ABDOMINAL SURGERY    . COLON SURGERY    . CYSTOSCOPY WITH LITHOLAPAXY N/A 06/09/2017   Procedure: CYSTOSCOPY WITH LITHOLAPAXY;  Surgeon: Hollice Espy, MD;  Location: [facility] ORS;  Service: Urology;  Laterality: N/A;  . PROSTATE SURGERY  05/25/2016  . SUPRAPUBIC CATHETER INSERTION      FAMILY HISTORY: Family History  Problem Relation Age of Onset  . Hypertension Other   . Alcohol abuse Mother   . Heart attack Father   . Prostate cancer Brother   . Breast cancer Sister     ADVANCED DIRECTIVES (Y/N):  N  HEALTH MAINTENANCE: Social History   Tobacco Use  . Smoking status: Current Every Day Smoker    Packs/day: 0.25    Years: 40.00    Pack years: 10.00    Types: Cigarettes  . Smokeless tobacco: Never Used  . Tobacco comment: 1pack a week  Vaping Use  . Vaping Use: Never used  Substance Use Topics  . Alcohol use: Yes  . Drug use: No     Colonoscopy:  PAP:  Bone density:  Lipid panel:  No Known Allergies  Current Outpatient Medications  Medication Sig Dispense Refill  . abiraterone acetate (ZYTIGA) 250 MG tablet TAKE 4 TABLETS (1,000 MG TOTAL) BY MOUTH DAILY. TAKE ON AN EMPTY STOMACH 1 HOUR BEFORE  OR 2 HOURS AFTER A MEAL. 120 tablet 3  . furosemide (LASIX) 20 MG tablet Take one tab by mouth daily, as needed for swelling 30 tablet 1  . mirabegron ER (MYRBETRIQ) 25 MG TB24 tablet Take 1 tablet (25 mg total) by mouth daily. 30 tablet 3  . predniSONE (DELTASONE) 5 MG tablet TAKE 1 TABLET BY MOUTH ONCE DAILY WITH BREAKFAST 30 tablet 3  . sulfamethoxazole-trimethoprim (BACTRIM) 400-80 MG tablet Take 1 tablet by mouth 2 (two) times daily. 14 tablet 0  . oxybutynin (DITROPAN) 5 MG tablet Take 1 tablet (5 mg total) by mouth 3 (three) times  daily. 90 tablet 1   No current facility-administered medications for this visit.    OBJECTIVE: Vitals:   01/19/20 1027  BP: 110/73  Pulse: 99  Resp: 20  Temp: 98.3 F (36.8 C)  SpO2: 97%     Body mass index is 32.12 kg/m.    ECOG FS:0 - Asymptomatic   General: Well-developed, well-nourished, no acute distress. Eyes: Pink conjunctiva, anicteric sclera. HEENT: Normocephalic, moist mucous membranes. Lungs: No audible wheezing or coughing. Heart: Regular rate and rhythm. Abdomen: Soft, nontender, no obvious distention.  Suprapubic catheter in place. Musculoskeletal: No edema, cyanosis, or clubbing. Neuro: Alert, answering all questions appropriately. Cranial nerves grossly intact. Skin: No rashes or petechiae noted. Psych: Normal affect.   LAB RESULTS:  Lab Results  Component Value Date   NA 137 01/19/2020   K 4.1 01/19/2020   CL 102 01/19/2020   CO2 24 01/19/2020   GLUCOSE 113 (H) 01/19/2020   BUN 8 01/19/2020   CREATININE 0.64 01/19/2020   CALCIUM 9.1 01/19/2020   PROT 7.4 01/19/2020   ALBUMIN 3.8 01/19/2020   AST 34 01/19/2020   ALT 27 01/19/2020   ALKPHOS 99 01/19/2020   BILITOT 0.9 01/19/2020   GFRNONAA >60 01/19/2020   GFRAA >60 01/19/2020    Lab Results  Component Value Date   WBC 7.7 01/19/2020   NEUTROABS 5.8 01/19/2020   HGB 15.0 01/19/2020   HCT 41.2 01/19/2020   MCV 103.5 (H) 01/19/2020   PLT 110 (L) 01/19/2020     STUDIES: No results found.  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 In 2017 upon diagnosis, he reportedly received XRT along with brachii therapy.  He last received Lupron in September 2018.  Patient states he had allergic reaction to Lupron and refuses to take any further injections.  He  also was given a prescription for Casodex by urology, but has refused to take this as well.  He states he will never take any chemotherapy, but reluctantly agreed to initiate Zytiga plus prednisone.  Typically in castrate sensitive disease should be treated with both Zytiga and Lupron, but patient is refusing injections as above.  Patient is tolerating his treatments well without significant side effects.  PSA continues to trend down and is now 1.26.  Today's result is pending. Continue Zytiga and prednisone indefinitely or until progression of disease.  Return to clinic in 3 months with repeat laboratory work and further evaluation. 2.  Suprapubic catheter: Patient states he has home health to manage it.  He does not want to return to urology at this time.  Patient was given a prescription for oxybutynin for spasms.  He was  instructed that if this does not help his symptoms he will have to see urology. 3.  Poor appetite: Improved.  Patient previously declined GI or dietary consultation. 4.  Hyperbilirubinemia: Resolved.   5.  Thrombocytopenia: Platelet count has improved to 110.  Monitor. 6.  Bony lesions: Patient declines treatment with Zometa or Xgeva.  Patient expressed understanding and was in agreement with this plan. He also understands that He can call clinic at any time with any questions, concerns, or complaints.   Cancer Staging Prostate cancer [facility]) Staging form: Prostate, AJCC 8th Edition - Clinical stage from 03/04/2018: Stage IVB (cT3a, cN0, cM1b, Grade Group: 5) - Signed by Lloyd Huger, MD on 03/04/2018   Lloyd Huger, MD   01/19/2020 12:34 PM

## 2020-01-15 ENCOUNTER — Telehealth: Payer: Self-pay

## 2020-01-15 NOTE — Telephone Encounter (Signed)
Confirmed and screened for 01-17-20 ov.

## 2020-01-17 ENCOUNTER — Other Ambulatory Visit: Payer: Self-pay

## 2020-01-17 ENCOUNTER — Ambulatory Visit (INDEPENDENT_AMBULATORY_CARE_PROVIDER_SITE_OTHER): Payer: Medicare HMO | Admitting: Hospice and Palliative Medicine

## 2020-01-17 ENCOUNTER — Encounter: Payer: Self-pay | Admitting: Hospice and Palliative Medicine

## 2020-01-17 VITALS — BP 139/77 | HR 81 | Temp 98.6°F | Resp 16 | Ht 70.5 in | Wt 229.4 lb

## 2020-01-17 DIAGNOSIS — N3289 Other specified disorders of bladder: Secondary | ICD-10-CM

## 2020-01-17 DIAGNOSIS — R609 Edema, unspecified: Secondary | ICD-10-CM

## 2020-01-17 DIAGNOSIS — F17200 Nicotine dependence, unspecified, uncomplicated: Secondary | ICD-10-CM

## 2020-01-17 DIAGNOSIS — Z0001 Encounter for general adult medical examination with abnormal findings: Secondary | ICD-10-CM

## 2020-01-17 DIAGNOSIS — C61 Malignant neoplasm of prostate: Secondary | ICD-10-CM

## 2020-01-17 DIAGNOSIS — F1020 Alcohol dependence, uncomplicated: Secondary | ICD-10-CM

## 2020-01-17 DIAGNOSIS — Z1211 Encounter for screening for malignant neoplasm of colon: Secondary | ICD-10-CM

## 2020-01-17 DIAGNOSIS — R300 Dysuria: Secondary | ICD-10-CM

## 2020-01-17 MED ORDER — MIRABEGRON ER 25 MG PO TB24: 25.0000 mg | ORAL_TABLET | Freq: Every day | ORAL | 3 refills | Status: DC

## 2020-01-17 MED ORDER — FUROSEMIDE 20 MG PO TABS: ORAL_TABLET | ORAL | 1 refills | Status: DC

## 2020-01-17 NOTE — Progress Notes (Signed)
[facility] [place], [place] [address]  Internal MEDICINE  Office Visit Note  Patient Name: Gerald Odonnell  [id number]  [id number]  Date of Service: 01/19/2020  Chief Complaint  Patient presents with  . Medicare Wellness    pain in gut, bladde pain, meds dont help, leg pain  . Hypertension  . Quality Metric Gaps    colonoscopy     HPI Pt is here for routine health maintenance examination He is currently being treated for prostate cancer with bone mets by Dr. Grayland Ormond, he is currently taking Zytiga and prednisone, PSA responding well to treatment Has a suprapubic catheter, has in home nurses that routinely monitor and change his catheters, reports no complications He was recently seen and treated for wound on his right leg, healed at this time Leg wound healed He has a history of colon cancer, he is resistant to have a screening colonoscopy or to be seen by GI He continues to smoke about a pack of cigarettes a day and is drinking on average about 15 beers a night Overall he is feeling well, staying positive through his treatments, his oncologist advised him to stay at home mostly and to avoid crowds due to currently undergoing chemotherapy treatment and potential exposures to [place]  Current Medication: Outpatient Encounter Medications as of 01/17/2020  Medication Sig  . abiraterone acetate (ZYTIGA) 250 MG tablet TAKE 4 TABLETS (1,000 MG TOTAL) BY MOUTH DAILY. TAKE ON AN EMPTY STOMACH 1 HOUR BEFORE OR 2 HOURS AFTER A MEAL.  . furosemide (LASIX) 20 MG tablet Take one tab by mouth daily, as needed for swelling  . predniSONE (DELTASONE) 5 MG tablet TAKE 1 TABLET BY MOUTH ONCE DAILY WITH BREAKFAST  . sulfamethoxazole-trimethoprim (BACTRIM) 400-80 MG tablet Take 1 tablet by mouth 2 (two) times daily.  . [DISCONTINUED] furosemide (LASIX) 20 MG tablet Take one tab by mouth daily, as needed for swelling  . mirabegron ER (MYRBETRIQ) 25 MG TB24 tablet Take 1  tablet (25 mg total) by mouth daily.  . [DISCONTINUED] mirabegron ER (MYRBETRIQ) 25 MG TB24 tablet Take 1 tablet (25 mg total) by mouth daily. (Patient not taking: Reported on 01/17/2020)   No facility-administered encounter medications on file as of 01/17/2020.    Surgical History: Past Surgical History:  Procedure Laterality Date  . ABDOMINAL SURGERY    . COLON SURGERY    . CYSTOSCOPY WITH LITHOLAPAXY N/A 06/09/2017   Procedure: CYSTOSCOPY WITH LITHOLAPAXY;  Surgeon: Hollice Espy, MD;  Location: [facility] ORS;  Service: Urology;  Laterality: N/A;  . PROSTATE SURGERY  05/25/2016  . SUPRAPUBIC CATHETER INSERTION      Medical History: Past Medical History:  Diagnosis Date  . Cancer ([place])    colon  . Cancer ([place])    bone cancer  . Colon cancer ([place])    Hx of  . History of kidney stones   . Hypertension   . Prostate cancer ([place])   . Prostate cancer ([place])    Hx of   . S/P colon resection   . Shortness of breath dyspnea   . Suprapubic catheter ([place])   . Urinary retention     Family History: Family History  Problem Relation Age of Onset  . Hypertension Other   . Alcohol abuse Mother   . Heart attack Father   . Prostate cancer Brother   . Breast cancer Sister    Review of Systems  Constitutional: Negative for chills, fatigue and unexpected weight change.  HENT: Negative  for congestion, postnasal drip, rhinorrhea, sneezing and sore throat.   Eyes: Negative for redness.  Respiratory: Negative for cough, chest tightness and shortness of breath.   Cardiovascular: Negative for chest pain, palpitations and leg swelling.  Gastrointestinal: Negative for abdominal pain, constipation, diarrhea, nausea and vomiting.  Genitourinary: Negative for dysuria and frequency.       Suprapubic catheter  Musculoskeletal: Negative for arthralgias, back pain, joint swelling and neck pain.  Skin: Negative for rash.  Neurological: Negative for tremors and numbness.  Hematological: Negative for  adenopathy. Does not bruise/bleed easily.  Psychiatric/Behavioral: Negative for behavioral problems (Depression), sleep disturbance and suicidal ideas. The patient is not nervous/anxious.    Vital Signs: BP 139/77   Pulse 81   Temp 98.6 F (37 C)   Resp 16   Ht 5' 10.5" (1.791 m)   Wt 229 lb 6.4 oz (104.1 kg)   SpO2 96%   BMI 32.45 kg/m    Physical Exam Vitals reviewed.  Constitutional:      Appearance: Normal appearance.  HENT:     Mouth/Throat:     Mouth: Mucous membranes are moist.     Pharynx: Oropharynx is clear.  Cardiovascular:     Rate and Rhythm: Normal rate and regular rhythm.     Pulses: Normal pulses.     Heart sounds: Normal heart sounds.  Pulmonary:     Effort: Pulmonary effort is normal.     Breath sounds: Normal breath sounds.  Abdominal:     General: Abdomen is flat.     Palpations: Abdomen is soft.  Musculoskeletal:        General: Normal range of motion.     Cervical back: Normal range of motion.  Skin:    General: Skin is warm.  Neurological:     General: No focal deficit present.     Mental Status: He is alert and oriented to person, place, and time. Mental status is at baseline.  Psychiatric:        Mood and Affect: Mood normal.        Behavior: Behavior normal.        Thought Content: Thought content normal.    LABS: Recent Results (from the past 2160 hour(s))  UA/M w/rflx Culture, Routine     Status: Abnormal (Preliminary result)   Collection Time: 01/17/20  9:12 AM   Specimen: Urine   Urine  Result Value Ref Range   Specific Gravity, UA 1.008 1.005 - 1.030   pH, UA 7.0 5.0 - 7.5   Color, UA Yellow Yellow   Appearance Ur Clear Clear   Leukocytes,UA 1+ (A) Negative   Protein,UA Negative Negative/Trace   Glucose, UA Negative Negative   Ketones, UA Negative Negative   RBC, UA Negative Negative   Bilirubin, UA Negative Negative   Urobilinogen, Ur 0.2 0.2 - 1.0 mg/dL   Nitrite, UA Negative Negative   Microscopic Examination See  below:     Comment: Microscopic was indicated and was performed.   Urinalysis Reflex Comment     Comment: This specimen has reflexed to a Urine Culture.  Microscopic Examination     Status: Abnormal   Collection Time: 01/17/20  9:12 AM   Urine  Result Value Ref Range   WBC, UA 11-30 (A) 0 - 5 /hpf   RBC None seen 0 - 2 /hpf   Epithelial Cells (non renal) 0-10 0 - 10 /hpf   Casts None seen None seen /lpf   Bacteria, UA Many (A) None  seen/Few  Urine Culture, Reflex     Status: None (Preliminary result)   Collection Time: 01/17/20  9:12 AM   Urine  Result Value Ref Range   Urine Culture, Routine WILL FOLLOW     Assessment/Plan: 1. Encounter for routine adult health examination with abnormal findings Ill appearing 64 year old male, discussed PHM that need to be updated. He is currently having routine labs drawn frequently ordered by his oncologist. He is not interested in having other labs drawn at this time.  2. Screening for colon cancer Due to his history of colon cancer strongly advised that he needs follow-up with GI. He has not had a follow-up or colonoscopy in many years. - Ambulatory referral to Gastroenterology  3. Prostate cancer [facility]) Current undergoing chemotherapy treatment, managed and followed closely by oncology. Prostate cancer with bone mets, PSA levels responding well to treatments.  4. Nicotine dependence with current use Continues to smoke about a pack of cigarettes a day. Discussed the need for PFT testing as well as possible CXR or low dose CT scan. He is not interested at this time. Will continue to discuss importance of these tests for monitoring at future visits.  5. Moderate alcohol use disorder (HCC) Strongly advised that he needs to limit his alcohol intake. He is drinking about a 15 beers per night, discussed the negative impact this high amount of alcohol is having on his overall health. At this time he is not interested in seeking help with cutting back  his alcohol intake. Will closely monitor and continue with encouragement at each visit.  6. Edema, unspecified type Stable today, requesting refills. Will continue to monitor. Last echo 2018, normal LVEF. He is not interested in having a follow-up echo at this time. He was told by a cardiologist that his heart was healthy and does not see the need in further testing. - furosemide (LASIX) 20 MG tablet; Take one tab by mouth daily, as needed for swelling  Dispense: 30 tablet; Refill: 1  7. Bladder spasms Bladder spasm continue to cause him discomfort has been improved since starting medication. Requesting refills today, will continue monitoring. If symptoms persist may need to see urologist, has suprapubic catheter. - mirabegron ER (MYRBETRIQ) 25 MG TB24 tablet; Take 1 tablet (25 mg total) by mouth daily.  Dispense: 30 tablet; Refill: 3  8. Dysuria - UA/M w/rflx Culture, Routine - Microscopic Examination - Urine Culture, Reflex  General Counseling: neely cecena understanding of the findings of todays visit and agrees with plan of treatment. I have discussed any further diagnostic evaluation that may be needed or ordered today. We also reviewed his medications today. he has been encouraged to call the office with any questions or concerns that should arise related to todays visit.    Counseling: Smoking cessation counseling: 1. Pt acknowledges the risks of long term smoking, she will try to quite smoking. 2. Options for different medications including nicotine products, chewing gum, patch etc, Wellbutrin and Chantix is discussed 3. Goal and date of compete cessation is discussed 4. Total time spent in smoking cessation is 15 min.    Orders Placed This Encounter  Procedures  . Microscopic Examination  . Urine Culture, Reflex  . UA/M w/rflx Culture, Routine  . Ambulatory referral to Gastroenterology    Meds ordered this encounter  Medications  . furosemide (LASIX) 20 MG tablet     Sig: Take one tab by mouth daily, as needed for swelling    Dispense:  30 tablet  Refill:  1  . mirabegron ER (MYRBETRIQ) 25 MG TB24 tablet    Sig: Take 1 tablet (25 mg total) by mouth daily.    Dispense:  30 tablet    Refill:  3    Total time spent: 35 Minutes  Time spent includes review of chart, medications, test results, and follow up plan with the patient.   This patient was seen by Casey Burkitt AGNP-C Collaboration with Dr Lavera Guise as a part of collaborative care agreement   Tanna Furry. [facility] Internal Medicine

## 2020-01-18 ENCOUNTER — Encounter: Payer: Self-pay | Admitting: Oncology

## 2020-01-18 NOTE — Progress Notes (Signed)
Patient states he is pain in abdomen/gut area and rates pain at 8. States he would like to discuss stronger pain medication with provider at appointment. Denies other concerns at this time.

## 2020-01-19 ENCOUNTER — Inpatient Hospital Stay: Payer: Medicare HMO

## 2020-01-19 ENCOUNTER — Encounter: Payer: Self-pay | Admitting: Oncology

## 2020-01-19 ENCOUNTER — Inpatient Hospital Stay (HOSPITAL_BASED_OUTPATIENT_CLINIC_OR_DEPARTMENT_OTHER): Payer: Medicare HMO | Admitting: Oncology

## 2020-01-19 ENCOUNTER — Encounter: Payer: Self-pay | Admitting: Hospice and Palliative Medicine

## 2020-01-19 ENCOUNTER — Other Ambulatory Visit: Payer: Self-pay

## 2020-01-19 ENCOUNTER — Inpatient Hospital Stay: Payer: Medicare HMO | Attending: Oncology

## 2020-01-19 VITALS — BP 110/73 | HR 99 | Temp 98.3°F | Resp 20 | Ht 70.5 in | Wt 227.1 lb

## 2020-01-19 DIAGNOSIS — Z7952 Long term (current) use of systemic steroids: Secondary | ICD-10-CM | POA: Insufficient documentation

## 2020-01-19 DIAGNOSIS — C61 Malignant neoplasm of prostate: Secondary | ICD-10-CM

## 2020-01-19 DIAGNOSIS — Z85038 Personal history of other malignant neoplasm of large intestine: Secondary | ICD-10-CM | POA: Insufficient documentation

## 2020-01-19 DIAGNOSIS — C7951 Secondary malignant neoplasm of bone: Secondary | ICD-10-CM | POA: Insufficient documentation

## 2020-01-19 DIAGNOSIS — I10 Essential (primary) hypertension: Secondary | ICD-10-CM | POA: Insufficient documentation

## 2020-01-19 DIAGNOSIS — D696 Thrombocytopenia, unspecified: Secondary | ICD-10-CM | POA: Diagnosis not present

## 2020-01-19 LAB — COMPREHENSIVE METABOLIC PANEL
ALT: 27 U/L (ref 0–44)
AST: 34 U/L (ref 15–41)
Albumin: 3.8 g/dL (ref 3.5–5.0)
Alkaline Phosphatase: 99 U/L (ref 38–126)
Anion gap: 11 (ref 5–15)
BUN: 8 mg/dL (ref 8–23)
CO2: 24 mmol/L (ref 22–32)
Calcium: 9.1 mg/dL (ref 8.9–10.3)
Chloride: 102 mmol/L (ref 98–111)
Creatinine, Ser: 0.64 mg/dL (ref 0.61–1.24)
GFR calc Af Amer: >60 mL/min (ref >60)
GFR calc non Af Amer: >60 mL/min (ref >60)
Glucose, Bld: 113 mg/dL — ABNORMAL HIGH (ref 70–99)
Potassium: 4.1 mmol/L (ref 3.5–5.1)
Sodium: 137 mmol/L (ref 135–145)
Total Bilirubin: 0.9 mg/dL (ref 0.3–1.2)
Total Protein: 7.4 g/dL (ref 6.5–8.1)

## 2020-01-19 LAB — CBC WITH DIFFERENTIAL/PLATELET
Abs Immature Granulocytes: 0.03 10*3/uL (ref 0.00–0.07)
Basophils Absolute: 0.1 10*3/uL (ref 0.0–0.1)
Basophils Relative: 1 %
Eosinophils Absolute: 0.1 10*3/uL (ref 0.0–0.5)
Eosinophils Relative: 1 %
HCT: 41.2 % (ref 39.0–52.0)
Hemoglobin: 15 g/dL (ref 13.0–17.0)
Immature Granulocytes: 0 %
Lymphocytes Relative: 14 %
Lymphs Abs: 1.1 10*3/uL (ref 0.7–4.0)
MCH: 37.7 pg — ABNORMAL HIGH (ref 26.0–34.0)
MCHC: 36.4 g/dL — ABNORMAL HIGH (ref 30.0–36.0)
MCV: 103.5 fL — ABNORMAL HIGH (ref 80.0–100.0)
Monocytes Absolute: 0.6 10*3/uL (ref 0.1–1.0)
Monocytes Relative: 8 %
Neutro Abs: 5.8 10*3/uL (ref 1.7–7.7)
Neutrophils Relative %: 76 %
Platelets: 110 10*3/uL — ABNORMAL LOW (ref 150–400)
RBC: 3.98 MIL/uL — ABNORMAL LOW (ref 4.22–5.81)
RDW: 12.1 % (ref 11.5–15.5)
WBC: 7.7 10*3/uL (ref 4.0–10.5)
nRBC: 0 % (ref 0.0–0.2)

## 2020-01-19 LAB — PHOSPHORUS: Phosphorus: 2.9 mg/dL (ref 2.5–4.6)

## 2020-01-19 LAB — PSA: Prostatic Specific Antigen: 1.38 ng/mL (ref 0.00–4.00)

## 2020-01-19 LAB — MAGNESIUM: Magnesium: 2.1 mg/dL (ref 1.7–2.4)

## 2020-01-19 MED ORDER — OXYBUTYNIN CHLORIDE 5 MG PO TABS: 5.0000 mg | ORAL_TABLET | Freq: Three times a day (TID) | ORAL | 1 refills | Status: DC

## 2020-01-24 LAB — UA/M W/RFLX CULTURE, ROUTINE
Bilirubin, UA: NEGATIVE
Glucose, UA: NEGATIVE
Ketones, UA: NEGATIVE
Nitrite, UA: NEGATIVE
Protein,UA: NEGATIVE
RBC, UA: NEGATIVE
Specific Gravity, UA: 1.008 (ref 1.005–1.030)
Urobilinogen, Ur: 0.2 mg/dL (ref 0.2–1.0)
pH, UA: 7 (ref 5.0–7.5)

## 2020-01-24 LAB — MICROSCOPIC EXAMINATION
Casts: NONE SEEN /lpf
RBC, Urine: NONE SEEN /hpf (ref 0–2)

## 2020-01-24 LAB — URINE CULTURE, REFLEX

## 2020-01-25 ENCOUNTER — Telehealth: Payer: Self-pay

## 2020-01-25 ENCOUNTER — Other Ambulatory Visit: Payer: Self-pay | Admitting: Hospice and Palliative Medicine

## 2020-01-25 MED ORDER — SULFAMETHOXAZOLE-TRIMETHOPRIM 800-160 MG PO TABS: 1.0000 | ORAL_TABLET | Freq: Two times a day (BID) | ORAL | 0 refills | Status: DC

## 2020-01-25 NOTE — Telephone Encounter (Signed)
-----   Message from Luiz Ochoa, NP sent at 01/25/2020  1:38 PM EDT ----- Please call patient to let him know he does have an infection found in the urine sample he gave [name] at last visit. I have sent Bactrim to [facility] pharmacy for him to take.

## 2020-01-25 NOTE — Telephone Encounter (Signed)
Pt.notified

## 2020-01-25 NOTE — Progress Notes (Signed)
Sent Bactrim to [facility] pharmacy for treatment of UTI, see results of urine culture.

## 2020-02-05 ENCOUNTER — Telehealth (INDEPENDENT_AMBULATORY_CARE_PROVIDER_SITE_OTHER): Payer: Self-pay | Admitting: Gastroenterology

## 2020-02-05 ENCOUNTER — Other Ambulatory Visit: Payer: Self-pay

## 2020-02-05 ENCOUNTER — Telehealth: Payer: Self-pay | Admitting: *Deleted

## 2020-02-05 DIAGNOSIS — Z1211 Encounter for screening for malignant neoplasm of colon: Secondary | ICD-10-CM

## 2020-02-05 DIAGNOSIS — Z85038 Personal history of other malignant neoplasm of large intestine: Secondary | ICD-10-CM

## 2020-02-05 MED ORDER — NA SULFATE-K SULFATE-MG SULF 17.5-3.13-1.6 GM/177ML PO SOLN: 1.0000 | Freq: Once | ORAL | 0 refills | Status: AC

## 2020-02-05 NOTE — Telephone Encounter (Signed)
For the record, I never told him not the take a test for COVID.  If fact, more likely the exact opposite.  Thanks!

## 2020-02-05 NOTE — Telephone Encounter (Signed)
Patient called reporting that he is being pushed to get the "virus test" and that Dr Grayland Ormond told him not to. He is asking if the test will hurt him or not. Please advise

## 2020-02-05 NOTE — Telephone Encounter (Signed)
Spoke to patient. I do not see any contraindication to having patient tested for covid. This is prescreening test for upcoming colonoscopy. Risk vs benefit discussed. Patient agreeable to proceed with screening test.

## 2020-02-05 NOTE — Progress Notes (Signed)
Gastroenterology Pre-Procedure Review  Request Date: Thursday  Requesting Physician: Dr. Vicente Males  PATIENT REVIEW QUESTIONS: The patient responded to the following health history questions as indicated:    1. Are you having any GI issues? no 2. Do you have a personal history of Polyps? no personal history of colon cancer 3. Do you have a family history of Colon Cancer or Polyps? brother died of pancreatic cancer, sister had breast cancer 4. Diabetes Mellitus? no 5. Joint replacements in the past 12 months?no 6. Major health problems in the past 3 months?no 7. Any artificial heart valves, MVP, or defibrillator?no    MEDICATIONS & ALLERGIES:    Patient reports the following regarding taking any anticoagulation/antiplatelet therapy:   Plavix, Coumadin, Eliquis, Xarelto, Lovenox, Pradaxa, Brilinta, or Effient? no Aspirin? no  Patient confirms/reports the following medications:  Current Outpatient Medications  Medication Sig Dispense Refill  . abiraterone acetate (ZYTIGA) 250 MG tablet TAKE 4 TABLETS (1,000 MG TOTAL) BY MOUTH DAILY. TAKE ON AN EMPTY STOMACH 1 HOUR BEFORE OR 2 HOURS AFTER A MEAL. 120 tablet 3  . furosemide (LASIX) 20 MG tablet Take one tab by mouth daily, as needed for swelling 30 tablet 1  . mirabegron ER (MYRBETRIQ) 25 MG TB24 tablet Take 1 tablet (25 mg total) by mouth daily. 30 tablet 3  . oxybutynin (DITROPAN) 5 MG tablet Take 1 tablet (5 mg total) by mouth 3 (three) times daily. 90 tablet 1  . predniSONE (DELTASONE) 5 MG tablet TAKE 1 TABLET BY MOUTH ONCE DAILY WITH BREAKFAST (Patient not taking: Reported on 02/05/2020) 30 tablet 3  . sulfamethoxazole-trimethoprim (BACTRIM DS) 800-160 MG tablet Take 1 tablet by mouth 2 (two) times daily. (Patient not taking: Reported on 02/05/2020) 10 tablet 0   No current facility-administered medications for this visit.    Patient confirms/reports the following allergies:  No Known Allergies  No orders of the defined types were  placed in this encounter.   AUTHORIZATION INFORMATION Primary Insurance: 1D#: Group #:  Secondary Insurance: 1D#: Group #:  SCHEDULE INFORMATION: Date: Thursday 11/11 Time: Location:[facility]

## 2020-02-06 ENCOUNTER — Telehealth: Payer: Self-pay

## 2020-02-06 NOTE — Telephone Encounter (Signed)
Patients call has been returned.  He wanted me to know that he would be able to have his colonoscopy and COVID test as scheduled because his daughter will be in town to take him.  Thanks,  [place], [place]

## 2020-02-06 NOTE — Telephone Encounter (Signed)
-----   Message from Storm Frisk, [place] sent at 02/05/2020  4:27 PM EDT ----- Darlyn Chamber,  Patient asked if you could call him back. He can reached at [phone].   Thanks, [place]

## 2020-02-07 MED FILL — predniSONE 5 MG TABS: 5 | 30 days supply | Qty: 30 | Fill #3

## 2020-02-07 MED FILL — ABIRATERONE ACETATE 250 MG: 250 | 30 days supply | Qty: 120 | Fill #3

## 2020-02-20 ENCOUNTER — Other Ambulatory Visit: Payer: Self-pay | Admitting: Oncology

## 2020-02-26 ENCOUNTER — Other Ambulatory Visit: Payer: Self-pay

## 2020-02-26 MED ORDER — OXYBUTYNIN CHLORIDE 5 MG PO TABS
5.0000 mg | ORAL_TABLET | Freq: Three times a day (TID) | ORAL | 1 refills | Status: DC
Start: 2020-02-26 — End: 2020-04-17

## 2020-03-04 ENCOUNTER — Other Ambulatory Visit: Payer: Self-pay

## 2020-03-04 ENCOUNTER — Other Ambulatory Visit: Payer: Self-pay | Admitting: Oncology

## 2020-03-04 ENCOUNTER — Telehealth: Payer: Self-pay

## 2020-03-04 DIAGNOSIS — Z1211 Encounter for screening for malignant neoplasm of colon: Secondary | ICD-10-CM

## 2020-03-04 DIAGNOSIS — C61 Malignant neoplasm of prostate: Secondary | ICD-10-CM

## 2020-03-04 MED ORDER — NA SULFATE-K SULFATE-MG SULF 17.5-3.13-1.6 GM/177ML PO SOLN: 1.0000 | Freq: Once | ORAL | 0 refills | Status: AC

## 2020-03-04 NOTE — Telephone Encounter (Signed)
Returned patients call. Pt requested to cancel procedure due to medical reasons. Procedure has been rescheduled.

## 2020-03-04 NOTE — Telephone Encounter (Signed)
CBC with Differential Order: [id number] Status:  Final result Visible to patient:  No (inaccessible in [place]) Next appt:  03/05/2020 at 12:10 PM in Pre-Admission Testing ([facility]) Dx:  Prostate cancer ([place])  0 Result Notes  Ref Range & Units 1 mo ago  (01/19/20) 1 mo ago  (01/17/20)  WBC 4.0 - 10.5 K/uL 7.7    RBC 4.22 - 5.81 MIL/uL 3.98Low  None seen R   Hemoglobin 13.0 - 17.0 g/dL 15.0    HCT 39 - 52 % 41.2    MCV 80.0 - 100.0 fL 103.5High    MCH 26.0 - 34.0 pg 37.7High    MCHC 30.0 - 36.0 g/dL 36.4High    RDW 11.5 - 15.5 % 12.1    Platelets 150 - 400 K/uL 110Low    nRBC 0.0 - 0.2 % 0.0    Neutrophils Relative % % 76    Neutro Abs 1.7 - 7.7 K/uL 5.8    Lymphocytes Relative % 14    Lymphs Abs 0.7 - 4.0 K/uL 1.1    Monocytes Relative % 8    Monocytes Absolute 0.1 - 1.0 K/uL 0.6    Eosinophils Relative % 1    Eosinophils Absolute 0.0 - 0.5 K/uL 0.1    Basophils Relative % 1    Basophils Absolute 0.0 - 0.1 K/uL 0.1    Immature Granulocytes % 0    Abs Immature Granulocytes 0.00 - 0.07 K/uL 0.03    Comment: Performed at [facility], [place], [place] [address]  WBC, UA   11-30Abnormal R   Epithelial Cells (non renal)   0-10 R   Casts   None seen R   Bacteria, UA   ManyAbnormal R   Resulting Agency  [facility]      Specimen Collected: 01/19/20 10:06 Last Resulted: 01/19/20 10:22     Lab Flowsheet   Order Details   View Encounter   Lab and Collection Details   Routing   Result History     R=Reference range differs from displayed range    Result Care Coordination  Patient Communication  Add Comments Not seen Back to Top      Other Results from 01/19/2020  Phosphorus  Status:  Final result Visible to patient:  No (inaccessible in MyChart) Next appt:  03/05/2020 at 12:10 PM in Pre-Admission Testing ([facility]) Dx:  Prostate cancer ([place]) Order: [id number]  0 Result Notes  Ref Range & Units 1 mo  ago 4 mo ago  Phosphorus 2.5 - 4.6 mg/dL 2.9  3.4 CM   Comment: Performed at [facility], [address] [place], [place] [address]  Resulting Agency  [facility]      Specimen Collected: 01/19/20 10:06 Last Resulted: 01/19/20 10:57     Lab Flowsheet   Order Details   View Encounter   Lab and Collection Details   Routing   Result History     CM=Additional comments    Result Care Coordination  Patient Communication  Add Comments Not seen Back to Top        Contains abnormal dataComprehensive metabolic panel  Status:  Final result Visible to patient:  No (inaccessible in MyChart) Next appt:  03/05/2020 at 12:10 PM in Pre-Admission Testing ([facility]) Dx:  Prostate cancer ([place]) Order: [id number]  0 Result Notes  Ref Range & Units 1 mo ago 4 mo ago  Sodium 135 - 145 mmol/L 137  136   Potassium 3.5 -  5.1 mmol/L 4.1  3.7   Chloride 98 - 111 mmol/L 102  102   CO2 22 - 32 mmol/L 24  22   Glucose, Bld 70 - 99 mg/dL 113High  107High CM   Comment: Glucose reference range applies only to samples taken after fasting for at least 8 hours.  BUN 8 - 23 mg/dL 8  9   Creatinine, Ser 0.61 - 1.24 mg/dL 0.64  0.63   Calcium 8.9 - 10.3 mg/dL 9.1  9.1   Total Protein 6.5 - 8.1 g/dL 7.4  7.6   Albumin 3.5 - 5.0 g/dL 3.8  4.0   AST 15 - 41 U/L 34  33   ALT 0 - 44 U/L 27  29   Alkaline Phosphatase 38 - 126 U/L 99  105   Total Bilirubin 0.3 - 1.2 mg/dL 0.9  1.0   GFR calc non Af Amer >60 mL/min >60  >60   GFR calc Af Amer >60 mL/min >60  >60   Anion gap 5 - 15 11  12  CM   Comment: Performed at [facility], [address] [place], [place] [address]  Resulting Agency  [facility]      Specimen Collected: 01/19/20 10:06 Last Resulted: 01/19/20 10:35     Lab Flowsheet   Order Details   View Encounter   Lab and Collection Details   Routing   Result History     CM=Additional comments    Result Care  Coordination  Patient Communication  Add Comments Not seen Back to Top        Magnesium  Status:  Final result Visible to patient:  No (inaccessible in MyChart) Next appt:  03/05/2020 at 12:10 PM in Pre-Admission Testing ([facility]) Dx:  Prostate cancer ([place]) Order: [id number]  0 Result Notes  Ref Range & Units 1 mo ago 4 mo ago  Magnesium 1.7 - 2.4 mg/dL 2.1  2.1 CM   Comment: Performed at [facility], [address] [place], [place] [address]  Resulting Agency  [facility]      Specimen Collected: 01/19/20 10:06 Last Resulted: 01/19/20 10:35     Lab Flowsheet   Order Details   View Encounter   Lab and Collection Details   Routing   Result History     CM=Additional comments    Result Care Coordination  Patient Communication  Add Comments Not seen Back to Top        PSA  Status:  Final result Visible to patient:  No (inaccessible in MyChart) Next appt:  03/05/2020 at 12:10 PM in Pre-Admission Testing ([facility]) Dx:  Prostate cancer ([place]) Order: [id number]  0 Result Notes  Ref Range & Units 1 mo ago 4 mo ago  Prostatic Specific Antigen 0.00 - 4.00 ng/mL 1.38  1.26 CM   Comment: (NOTE)  While PSA levels of <=4.0 ng/ml are reported as reference range, some  men with levels below 4.0 ng/ml can have prostate cancer and many men  with PSA above 4.0 ng/ml do not have prostate cancer. Other tests  such as free PSA, age specific reference ranges, PSA velocity and PSA  doubling time may be helpful especially in men less than 55 years  old.  Performed at [facility], [place] [address], [place], [place]  [address]   Resulting Agency  [facility]      Specimen Collected: 01/19/20 10:06 Last Resulted: 01/19/20 13:18

## 2020-03-05 ENCOUNTER — Inpatient Hospital Stay: Admission: RE | Admit: 2020-03-05 | Payer: Medicare HMO | Source: Ambulatory Visit

## 2020-03-07 MED FILL — predniSONE 5 MG TABS: 5 | 30 days supply | Qty: 30 | Fill #0

## 2020-03-07 MED FILL — ABIRATERONE ACETATE 250 MG: 250 | 30 days supply | Qty: 120 | Fill #0

## 2020-03-14 ENCOUNTER — Telehealth: Payer: Self-pay | Admitting: Family Medicine

## 2020-03-14 NOTE — Telephone Encounter (Signed)
[place] from Home health called stating patient complains of penile pain and there is pressure at the head of the penis. He has a SPT catheter. Patient is refusing care at our office. He does not want anyone to look at the penis. He told [place] that the last time he was at our office it was suggested that he have a Cysto done and he refuses to do this. He will not come to our office for anything. [place] states she is able to change the SPT tube monthly. She states she looked at the penis and there is no drainage or redness or swelling and the penis looks normal. She is letting us know so there is documentation of him refusing  care for this issue.

## 2020-03-18 ENCOUNTER — Ambulatory Visit (INDEPENDENT_AMBULATORY_CARE_PROVIDER_SITE_OTHER): Payer: Medicare HMO | Admitting: Hospice and Palliative Medicine

## 2020-03-18 ENCOUNTER — Encounter: Payer: Self-pay | Admitting: Hospice and Palliative Medicine

## 2020-03-18 ENCOUNTER — Other Ambulatory Visit: Payer: Self-pay

## 2020-03-18 DIAGNOSIS — C61 Malignant neoplasm of prostate: Secondary | ICD-10-CM

## 2020-03-18 DIAGNOSIS — N3091 Cystitis, unspecified with hematuria: Secondary | ICD-10-CM

## 2020-03-18 DIAGNOSIS — R300 Dysuria: Secondary | ICD-10-CM | POA: Diagnosis not present

## 2020-03-18 LAB — POCT URINALYSIS DIPSTICK
Bilirubin, UA: NEGATIVE
Glucose, UA: NEGATIVE
Nitrite, UA: NEGATIVE
Protein, UA: POSITIVE — AB
Spec Grav, UA: 1.02 (ref 1.010–1.025)
Urobilinogen, UA: 0.2 E.U./dL
pH, UA: 5 (ref 5.0–8.0)

## 2020-03-18 MED ORDER — SULFAMETHOXAZOLE-TRIMETHOPRIM 800-160 MG PO TABS: 1.0000 | ORAL_TABLET | Freq: Two times a day (BID) | ORAL | 0 refills | Status: DC

## 2020-03-18 NOTE — Progress Notes (Signed)
[facility] [place], [place] [address]  Internal MEDICINE  Office Visit Note  Patient Name: Gerald Odonnell  [id number]  [id number]  Date of Service: 03/23/2020  Chief Complaint  Patient presents with  . Acute Visit  . Urinary Tract Infection    x1week, burning, blood in urine  . Hypertension  . policy update form    received     HPI Pt is here for a sick visit. Walk-in, complaining of odor and dark color of his urine, has a suprapubic catheter, catheter was changed yesterday by home health nurse who recommended he follow-up with his PCP as soon as he could due to the odor and color of his urine No issues or complications with catheter or changing catheter Home health nurse also noticed blood in his urine when changing his catheter and he has noticed small amounts of blood in his urine when emptying his bag Denies lower back pain or fevers  He has not been to see his urologist in quite some time since having his surgery, not interested in going to see urologist at this time   Current Medication:  Outpatient Encounter Medications as of 03/18/2020  Medication Sig  . abiraterone acetate (ZYTIGA) 250 MG tablet TAKE 4 TABLETS (1,000 MG TOTAL) BY MOUTH DAILY. TAKE ON AN EMPTY STOMACH 1 HOUR BEFORE OR 2 HOURS AFTER A MEAL.  . furosemide (LASIX) 20 MG tablet Take one tab by mouth daily, as needed for swelling  . mirabegron ER (MYRBETRIQ) 25 MG TB24 tablet Take 1 tablet (25 mg total) by mouth daily.  Marland Kitchen oxybutynin (DITROPAN) 5 MG tablet Take 1 tablet (5 mg total) by mouth 3 (three) times daily.  . predniSONE (DELTASONE) 5 MG tablet TAKE 1 TABLET BY MOUTH ONCE DAILY WITH BREAKFAST  . sulfamethoxazole-trimethoprim (BACTRIM DS) 800-160 MG tablet Take 1 tablet by mouth 2 (two) times daily.  . [DISCONTINUED] sulfamethoxazole-trimethoprim (BACTRIM DS) 800-160 MG tablet Take 1 tablet by mouth 2 (two) times daily. (Patient not taking: Reported on 02/05/2020)   No  facility-administered encounter medications on file as of 03/18/2020.      Medical History: Past Medical History:  Diagnosis Date  . Cancer ([place])    colon  . Cancer ([place])    bone cancer  . Colon cancer ([place])    Hx of  . History of kidney stones   . Hypertension   . Prostate cancer ([place])   . Prostate cancer ([place])    Hx of   . S/P colon resection   . Shortness of breath dyspnea   . Suprapubic catheter ([place])   . Urinary retention      Vital Signs: BP 130/74   Pulse 95   Temp 98.3 F (36.8 C)   Resp 16   Ht 5' 10.5" (1.791 m)   Wt 229 lb 6.4 oz (104.1 kg)   SpO2 98%   BMI 32.45 kg/m    Review of Systems  Constitutional: Negative for chills, fatigue and unexpected weight change.  HENT: Negative for congestion, postnasal drip, rhinorrhea, sneezing and sore throat.   Eyes: Negative for photophobia, redness and visual disturbance.  Respiratory: Negative for cough, chest tightness and shortness of breath.   Cardiovascular: Negative for chest pain and palpitations.  Gastrointestinal: Negative for abdominal pain, constipation, diarrhea, nausea and vomiting.  Genitourinary: Positive for hematuria. Negative for dysuria and frequency.       Suprapubic catheter  Musculoskeletal: Negative for arthralgias, back pain, joint swelling and neck pain.  Skin: Negative for rash.  Neurological: Negative for tremors and numbness.  Hematological: Negative for adenopathy. Does not bruise/bleed easily.  Psychiatric/Behavioral: Negative for behavioral problems (Depression), sleep disturbance and suicidal ideas. The patient is not nervous/anxious.     Physical Exam Vitals reviewed.  Constitutional:      Appearance: Normal appearance. He is obese.  Cardiovascular:     Rate and Rhythm: Normal rate and regular rhythm.     Pulses: Normal pulses.     Heart sounds: Normal heart sounds.  Pulmonary:     Effort: Pulmonary effort is normal.     Breath sounds: Normal breath sounds.   Abdominal:     General: Abdomen is flat.     Palpations: Abdomen is soft.  Genitourinary:    Comments: Suprapubic catheter access clean, pink, no odor or discharge Musculoskeletal:        General: Normal range of motion.  Skin:    General: Skin is warm.  Neurological:     General: No focal deficit present.     Mental Status: He is alert and oriented to person, place, and time. Mental status is at baseline.  Psychiatric:        Mood and Affect: Mood normal.        Behavior: Behavior normal.        Thought Content: Thought content normal.        Judgment: Judgment normal.    Assessment/Plan: 1. Cystitis with hematuria Complicated due to suprapubic catheter and frequent changes of catheter Treat with Bactrim--await culture results Discussed the need to consider urology referral for assessment and management of suprapubic catheter - sulfamethoxazole-trimethoprim (BACTRIM DS) 800-160 MG tablet; Take 1 tablet by mouth 2 (two) times daily.  Dispense: 20 tablet; Refill: 0  2. Prostate cancer [facility]) Currently on Zytiga for chemotherapy--followed and managed by oncology  3. Dysuria - POCT Urinalysis Dipstick - CULTURE, URINE COMPREHENSIVE  General Counseling: cord wilczynski understanding of the findings of todays visit and agrees with plan of treatment. I have discussed any further diagnostic evaluation that may be needed or ordered today. We also reviewed his medications today. he has been encouraged to call the office with any questions or concerns that should arise related to todays visit.   Orders Placed This Encounter  Procedures  . CULTURE, URINE COMPREHENSIVE  . POCT Urinalysis Dipstick    Meds ordered this encounter  Medications  . sulfamethoxazole-trimethoprim (BACTRIM DS) 800-160 MG tablet    Sig: Take 1 tablet by mouth 2 (two) times daily.    Dispense:  20 tablet    Refill:  0    Time spent: 30 Minutes Time spent includes review of chart, medications, test  results and follow-up plan with the patient. This patient was seen by Theodoro Grist AGNP-C in Collaboration with Dr Lavera Guise as a part of collaborative care agreement.  Tanna Furry [facility] Internal Medicine

## 2020-03-21 LAB — CULTURE, URINE COMPREHENSIVE

## 2020-03-23 ENCOUNTER — Encounter: Payer: Self-pay | Admitting: Hospice and Palliative Medicine

## 2020-04-08 MED FILL — predniSONE 5 MG TABS: 5 | 30 days supply | Qty: 30 | Fill #1

## 2020-04-08 MED FILL — ABIRATERONE ACETATE 250 MG: 250 | 30 days supply | Qty: 120 | Fill #1

## 2020-04-17 ENCOUNTER — Other Ambulatory Visit: Payer: Self-pay

## 2020-04-17 ENCOUNTER — Telehealth: Payer: Self-pay

## 2020-04-17 ENCOUNTER — Ambulatory Visit (INDEPENDENT_AMBULATORY_CARE_PROVIDER_SITE_OTHER): Payer: Medicare HMO | Admitting: Hospice and Palliative Medicine

## 2020-04-17 ENCOUNTER — Encounter: Payer: Self-pay | Admitting: Hospice and Palliative Medicine

## 2020-04-17 VITALS — BP 150/90 | HR 94 | Temp 97.9°F | Resp 16 | Ht 70.5 in | Wt 227.0 lb

## 2020-04-17 DIAGNOSIS — F1020 Alcohol dependence, uncomplicated: Secondary | ICD-10-CM

## 2020-04-17 DIAGNOSIS — Z9359 Other cystostomy status: Secondary | ICD-10-CM | POA: Diagnosis not present

## 2020-04-17 DIAGNOSIS — R600 Localized edema: Secondary | ICD-10-CM

## 2020-04-17 MED ORDER — FUROSEMIDE 20 MG PO TABS: 20.0000 mg | ORAL_TABLET | Freq: Two times a day (BID) | ORAL | 2 refills | Status: DC

## 2020-04-17 MED ORDER — OXYBUTYNIN CHLORIDE 5 MG PO TABS: 5.0000 mg | ORAL_TABLET | Freq: Three times a day (TID) | ORAL | 1 refills | Status: DC

## 2020-04-17 NOTE — Progress Notes (Signed)
[facility] [place], [place] [address]  Internal MEDICINE  Office Visit Note  Patient Name: Gerald Odonnell  [id number]  [id number]  Date of Service: 04/26/2020  Chief Complaint  Patient presents with  . Hypertension  . 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 abiraterone acetate (ZYTIGA) 250 MG tablet TAKE 4 TABLETS (1,000 MG TOTAL) BY MOUTH DAILY. TAKE ON AN EMPTY STOMACH 1 HOUR BEFORE OR 2 HOURS AFTER A MEAL.  . furosemide (LASIX) 20 MG tablet Take 1 tablet (20 mg total) by mouth 2 (two) times daily.  . mirabegron ER (MYRBETRIQ) 25 MG TB24 tablet Take 1 tablet (25 mg total) by mouth daily.  Marland Kitchen oxybutynin (DITROPAN) 5 MG tablet Take 1 tablet (5 mg total) by mouth 3 (three) times daily.  . predniSONE (DELTASONE) 5 MG tablet TAKE 1 TABLET BY MOUTH ONCE DAILY WITH BREAKFAST  . [DISCONTINUED] furosemide (LASIX) 20 MG tablet Take one tab by mouth daily, as needed for swelling  . [DISCONTINUED] oxybutynin (DITROPAN) 5 MG tablet Take 1 tablet (5 mg total) by mouth 3 (three) times daily.  . [DISCONTINUED] sulfamethoxazole-trimethoprim (BACTRIM DS) 800-160 MG tablet Take 1 tablet by mouth 2 (two) times daily.   No facility-administered encounter medications on file as of 04/17/2020.    Surgical History: Past Surgical History:  Procedure Laterality Date  . ABDOMINAL SURGERY    . COLON SURGERY    . CYSTOSCOPY WITH LITHOLAPAXY N/A 06/09/2017    Procedure: CYSTOSCOPY WITH LITHOLAPAXY;  Surgeon: Hollice Espy, MD;  Location: [facility] ORS;  Service: Urology;  Laterality: N/A;  . PROSTATE SURGERY  05/25/2016  . SUPRAPUBIC CATHETER INSERTION      Medical History: Past Medical History:  Diagnosis Date  . Cancer ([place])    colon  . Cancer ([place])    bone cancer  . Colon cancer ([place])    Hx of  . History of kidney stones   . Hypertension   . Prostate cancer ([place])   . Prostate cancer ([place])    Hx of   . S/P colon resection   . Shortness of breath dyspnea   . Suprapubic catheter ([place])   . Urinary retention     Family History: Family History  Problem Relation Age of Onset  . Hypertension Other   . Alcohol abuse Mother   . Heart attack Father   . Prostate cancer Brother   . Breast cancer Sister     Social History   Socioeconomic History  . Marital status: Widowed    Spouse name: Not on file  . Number of children: Not on file  . Years of education: Not on file  . Highest education level: Not on file  Occupational History  . Not on file  Tobacco Use  . Smoking status: Current Every Day Smoker    Packs/day: 0.25    Years: 40.00    Pack years: 10.00    Types: Cigarettes  . Smokeless tobacco: Never Used  . Tobacco comment: 1pack  a week  Vaping Use  . Vaping Use: Never used  Substance and Sexual Activity  . Alcohol use: Yes    Alcohol/week: 24.0 standard drinks    Types: 24 Cans of beer per week  . Drug use: No  . Sexual activity: Not on file  Other Topics Concern  . Not on file  Social History Narrative   ** Merged History Encounter **       Social Determinants of Health   Financial Resource Strain: Not on file  Food Insecurity: Not on file  Transportation Needs: Not on file  Physical Activity: Not on file  Stress: Not on file  Social Connections: Not on file  Intimate Partner Violence: Not on file      Review of Systems  Constitutional: Negative for chills, fatigue and unexpected weight change.   HENT: Negative for congestion, postnasal drip, rhinorrhea, sneezing and sore throat.   Eyes: Negative for redness.  Respiratory: Negative for cough, chest tightness and shortness of breath.   Cardiovascular: Negative for chest pain and palpitations.  Gastrointestinal: Negative for abdominal pain, constipation, diarrhea, nausea and vomiting.  Genitourinary: Negative for dysuria and frequency.       Suprapubic catheter  Musculoskeletal: Negative for arthralgias, back pain, joint swelling and neck pain.  Skin: Negative for rash.  Neurological: Negative for tremors and numbness.  Hematological: Negative for adenopathy. Does not bruise/bleed easily.  Psychiatric/Behavioral: Negative for behavioral problems (Depression), sleep disturbance and suicidal ideas. The patient is not nervous/anxious.     Vital Signs: BP (!) 150/90   Pulse 94   Temp 97.9 F (36.6 C)   Resp 16   Ht 5' 10.5" (1.791 m)   Wt 227 lb (103 kg)   SpO2 97%   BMI 32.11 kg/m    Physical Exam Vitals reviewed.  Constitutional:      Appearance: He is obese.  Cardiovascular:     Rate and Rhythm: Normal rate and regular rhythm.     Pulses: Normal pulses.     Heart sounds: Normal heart sounds.  Pulmonary:     Effort: Pulmonary effort is normal.     Breath sounds: Normal breath sounds.  Genitourinary:    Comments: Suprapubic catheter, clean, skin intact, no erythema  present Musculoskeletal:     Right lower leg: 3+ Edema present.     Left lower leg: 3+ Edema present.  Skin:    General: Skin is warm.  Neurological:     General: No focal deficit present.     Mental Status: He is alert and oriented to person, place, and time. Mental status is at baseline.  Psychiatric:        Mood and Affect: Mood normal.        Behavior: Behavior normal.        Thought Content: Thought content normal.        Judgment: Judgment normal.    Assessment/Plan: 1. Suprapubic catheter ([place]) Catheter insertion site clear, remains  free of infection at this time, significant improvement in appearance of urine output Discussed we will refer to urology for next occurrence of infection Requesting refills of oxybutynin for bladder spasms related to catheter - oxybutynin (DITROPAN) 5 MG tablet; Take 1 tablet (5 mg total) by mouth 3 (three) times daily.  Dispense: 90 tablet; Refill: 1  2. Bilateral lower extremity edema Advised to start furosemide daily for bilateral ankle edema, previously prescribed as needed Will need repeat labs, order given as he has labs routinely drawn by home  health agency Last echo 2018--normal, consider repeat - furosemide (LASIX) 20 MG tablet; Take 1 tablet (20 mg total) by mouth 2 (two) times daily.  Dispense: 60 tablet; Refill: 2  3. Moderate alcohol use disorder (HCC) Again, discussed importance of alcohol cessation Continues to drink 3-4 beers per night  General Counseling: eshan hayman understanding of the findings of todays visit and agrees with plan of treatment. I have discussed any further diagnostic evaluation that may be needed or ordered today. We also reviewed his medications today. he has been encouraged to call the office with any questions or concerns that should arise related to todays visit.  Meds ordered this encounter  Medications  . oxybutynin (DITROPAN) 5 MG tablet    Sig: Take 1 tablet (5 mg total) by mouth 3 (three) times daily.    Dispense:  90 tablet    Refill:  1  . furosemide (LASIX) 20 MG tablet    Sig: Take 1 tablet (20 mg total) by mouth 2 (two) times daily.    Dispense:  60 tablet    Refill:  2    Time spent: 30 Minutes Time spent includes review of chart, medications, test results and follow-up plan with the patient.  This patient was seen by Theodoro Grist AGNP-C in Collaboration with Dr Lavera Guise as a part of collaborative care agreement     Tanna Furry. Alantis Bethune AGNP-C Internal medicine

## 2020-04-21 NOTE — Progress Notes (Signed)
[place]  [phone]) [phone] Fax:(336) [phone]  ID: Gerald Odonnell OB: 08-12-1955  MR#: [id number]  [id number]  Patient Care Team: Lavera Guise, MD as PCP - General (Internal Medicine) Ronnell Freshwater, NP (Family Medicine) Lloyd Huger, MD as Consulting Physician (Oncology)  CHIEF COMPLAINT: Prostate cancer metastatic to bone  INTERVAL HISTORY: Patient returns to clinic today for repeat laboratory work and routine 56-month evaluation.  He currently feels well and is asymptomatic.  He does not complain of any issues with his suprapubic catheter today.  He continues to tolerate Zytiga and prednisone well without significant side effects. He has no neurologic complaints.  He denies any recent fevers or illnesses.  He denies any other pain.  He denies any chest pain, shortness of breath, cough, or hemoptysis.  He denies any nausea, vomiting, constipation, or diarrhea.  Patient offers no specific complaints today.  REVIEW OF SYSTEMS:   Review of Systems  Constitutional: Negative.  Negative for fever, malaise/fatigue and weight loss.  Respiratory: Negative.  Negative for cough, hemoptysis and shortness of breath.   Cardiovascular: Negative.  Negative for chest pain and leg swelling.  Gastrointestinal: Negative.  Negative for abdominal pain, blood in stool and melena.  Genitourinary: Negative.  Negative for dysuria and hematuria.  Musculoskeletal: Negative.  Negative for back pain.  Skin: Negative.  Negative for rash.  Neurological: Negative.  Negative for focal weakness, weakness and headaches.  Psychiatric/Behavioral: Negative.  The patient is not nervous/anxious.     As per HPI. Otherwise, a complete review of systems is negative.  PAST MEDICAL HISTORY: Past Medical History:  Diagnosis Date  . Cancer ([place])    colon  . Cancer ([place])    bone cancer  . Colon cancer ([place])    Hx of  . History of kidney stones   . Hypertension   . Prostate  cancer ([place])   . Prostate cancer ([place])    Hx of   . S/P colon resection   . Shortness of breath dyspnea   . Suprapubic catheter ([place])   . Urinary retention     PAST SURGICAL HISTORY: Past Surgical History:  Procedure Laterality Date  . ABDOMINAL SURGERY    . COLON SURGERY    . CYSTOSCOPY WITH LITHOLAPAXY N/A 06/09/2017   Procedure: CYSTOSCOPY WITH LITHOLAPAXY;  Surgeon: Hollice Espy, MD;  Location: [facility] ORS;  Service: Urology;  Laterality: N/A;  . PROSTATE SURGERY  05/25/2016  . SUPRAPUBIC CATHETER INSERTION      FAMILY HISTORY: Family History  Problem Relation Age of Onset  . Hypertension Other   . Alcohol abuse Mother   . Heart attack Father   . Prostate cancer Brother   . Breast cancer Sister     ADVANCED DIRECTIVES (Y/N):  N  HEALTH MAINTENANCE: Social History   Tobacco Use  . Smoking status: Current Every Day Smoker    Packs/day: 0.25    Years: 40.00    Pack years: 10.00    Types: Cigarettes  . Smokeless tobacco: Never Used  . Tobacco comment: 1pack a week  Vaping Use  . Vaping Use: Never used  Substance Use Topics  . Alcohol use: Yes    Alcohol/week: 24.0 standard drinks    Types: 24 Cans of beer per week  . Drug use: No     Colonoscopy:  PAP:  Bone density:  Lipid panel:  No Known Allergies  Current Outpatient Medications  Medication Sig Dispense Refill  . abiraterone acetate (ZYTIGA) 250 MG  tablet TAKE 4 TABLETS (1,000 MG TOTAL) BY MOUTH DAILY. TAKE ON AN EMPTY STOMACH 1 HOUR BEFORE OR 2 HOURS AFTER A MEAL. 120 tablet 3  . furosemide (LASIX) 20 MG tablet Take 1 tablet (20 mg total) by mouth 2 (two) times daily. 60 tablet 2  . mirabegron ER (MYRBETRIQ) 25 MG TB24 tablet Take 1 tablet (25 mg total) by mouth daily. 30 tablet 3  . oxybutynin (DITROPAN) 5 MG tablet Take 1 tablet (5 mg total) by mouth 3 (three) times daily. 90 tablet 1  . predniSONE (DELTASONE) 5 MG tablet TAKE 1 TABLET BY MOUTH ONCE DAILY WITH BREAKFAST 30 tablet 3   No current  facility-administered medications for this visit.    OBJECTIVE: Vitals:   04/24/20 1003  BP: 125/80  Pulse: 95  Resp: 18  Temp: 99.1 F (37.3 C)  SpO2: 98%     Body mass index is 32.11 kg/m.    ECOG FS:0 - Asymptomatic   General: Well-developed, well-nourished, no acute distress. Eyes: Pink conjunctiva, anicteric sclera. HEENT: Normocephalic, moist mucous membranes. Lungs: No audible wheezing or coughing. Heart: Regular rate and rhythm. Abdomen: Soft, nontender, no obvious distention.  Suprapubic catheter in place. Musculoskeletal: No edema, cyanosis, or clubbing.   Neuro: Alert, answering all questions appropriately. Cranial nerves grossly intact. Skin: No rashes or petechiae noted. Psych: Normal affect.    LAB RESULTS:  Lab Results  Component Value Date   NA 135 04/24/2020   K 3.7 04/24/2020   CL 98 04/24/2020   CO2 27 04/24/2020   GLUCOSE 115 (H) 04/24/2020   BUN 10 04/24/2020   CREATININE 0.62 04/24/2020   CALCIUM 9.4 04/24/2020   PROT 7.5 04/24/2020   ALBUMIN 4.0 04/24/2020   AST 34 04/24/2020   ALT 23 04/24/2020   ALKPHOS 99 04/24/2020   BILITOT 1.1 04/24/2020   GFRNONAA >60 04/24/2020   GFRAA >60 01/19/2020    Lab Results  Component Value Date   WBC 8.2 04/24/2020   NEUTROABS 6.2 04/24/2020   HGB 14.5 04/24/2020   HCT 40.1 04/24/2020   MCV 106.9 (H) 04/24/2020   PLT 127 (L) 04/24/2020     STUDIES: No results found.  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 In 2017 upon diagnosis, he reportedly received XRT along with brachii therapy.  He last received Lupron in September 2018.  Patient states he had allergic reaction to Lupron and refuses to take any further injections. He also was given a prescription for  Casodex by urology, but has refused to take this as well.  He states he will never take any chemotherapy, but reluctantly agreed to initiate Zytiga plus prednisone.  Typically in castrate sensitive disease should be treated with both Zytiga and Lupron, but patient is refusing injections as above.  Patient is tolerating his treatments well without significant side effects.  PSA appears to have plateaued and his most recent value was 1.38.  Today's result is pending.  Continue Zytiga and prednisone indefinitely or until progression of disease.  No further interventions are needed.  Return to clinic in 3 months for repeat laboratory work and routine evaluation.  2.  Suprapubic catheter: Patient states he has home health to manage it.  He does not want to return to urology at this  time.  Continue oxybutynin as needed for spasms.  He was instructed that if this does not help his symptoms he will have to see urology. 3.  Poor appetite: Patient does not complain of this today.  Patient previously declined GI or dietary consultation. 4.  Hyperbilirubinemia: Resolved.   5.  Thrombocytopenia: Platelet count continues to improve and is now 127. 6.  Bony lesions: Patient declines treatment with Zometa or Xgeva.  Patient expressed understanding and was in agreement with this plan. He also understands that He can call clinic at any time with any questions, concerns, or complaints.   Cancer Staging Prostate cancer [facility]) Staging form: Prostate, AJCC 8th Edition - Clinical stage from 03/04/2018: Stage IVB (cT3a, cN0, cM1b, Grade Group: 5) - Signed by Lloyd Huger, MD on 03/04/2018   Lloyd Huger, MD   04/24/2020 3:50 PM

## 2020-04-23 ENCOUNTER — Other Ambulatory Visit: Payer: Self-pay | Admitting: *Deleted

## 2020-04-23 ENCOUNTER — Ambulatory Visit: Payer: Medicare HMO | Admitting: Hospice and Palliative Medicine

## 2020-04-23 DIAGNOSIS — C61 Malignant neoplasm of prostate: Secondary | ICD-10-CM

## 2020-04-24 ENCOUNTER — Inpatient Hospital Stay: Payer: Medicare HMO | Attending: Oncology

## 2020-04-24 ENCOUNTER — Encounter: Payer: Self-pay | Admitting: Oncology

## 2020-04-24 ENCOUNTER — Inpatient Hospital Stay (HOSPITAL_BASED_OUTPATIENT_CLINIC_OR_DEPARTMENT_OTHER): Payer: Medicare HMO | Admitting: Oncology

## 2020-04-24 ENCOUNTER — Inpatient Hospital Stay: Payer: Medicare HMO

## 2020-04-24 VITALS — BP 125/80 | HR 95 | Temp 99.1°F | Resp 18 | Wt 227.0 lb

## 2020-04-24 DIAGNOSIS — D696 Thrombocytopenia, unspecified: Secondary | ICD-10-CM | POA: Diagnosis not present

## 2020-04-24 DIAGNOSIS — F17210 Nicotine dependence, cigarettes, uncomplicated: Secondary | ICD-10-CM | POA: Diagnosis not present

## 2020-04-24 DIAGNOSIS — C61 Malignant neoplasm of prostate: Secondary | ICD-10-CM

## 2020-04-24 DIAGNOSIS — Z79899 Other long term (current) drug therapy: Secondary | ICD-10-CM | POA: Diagnosis not present

## 2020-04-24 DIAGNOSIS — I10 Essential (primary) hypertension: Secondary | ICD-10-CM | POA: Insufficient documentation

## 2020-04-24 DIAGNOSIS — C7951 Secondary malignant neoplasm of bone: Secondary | ICD-10-CM | POA: Insufficient documentation

## 2020-04-24 LAB — COMPREHENSIVE METABOLIC PANEL
ALT: 23 U/L (ref 0–44)
AST: 34 U/L (ref 15–41)
Albumin: 4 g/dL (ref 3.5–5.0)
Alkaline Phosphatase: 99 U/L (ref 38–126)
Anion gap: 10 (ref 5–15)
BUN: 10 mg/dL (ref 8–23)
CO2: 27 mmol/L (ref 22–32)
Calcium: 9.4 mg/dL (ref 8.9–10.3)
Chloride: 98 mmol/L (ref 98–111)
Creatinine, Ser: 0.62 mg/dL (ref 0.61–1.24)
GFR, Estimated: >60 mL/min (ref >60)
Glucose, Bld: 115 mg/dL — ABNORMAL HIGH (ref 70–99)
Potassium: 3.7 mmol/L (ref 3.5–5.1)
Sodium: 135 mmol/L (ref 135–145)
Total Bilirubin: 1.1 mg/dL (ref 0.3–1.2)
Total Protein: 7.5 g/dL (ref 6.5–8.1)

## 2020-04-24 LAB — CBC WITH DIFFERENTIAL/PLATELET
Abs Immature Granulocytes: 0.03 10*3/uL (ref 0.00–0.07)
Basophils Absolute: 0.1 10*3/uL (ref 0.0–0.1)
Basophils Relative: 1 %
Eosinophils Absolute: 0.1 10*3/uL (ref 0.0–0.5)
Eosinophils Relative: 1 %
HCT: 40.1 % (ref 39.0–52.0)
Hemoglobin: 14.5 g/dL (ref 13.0–17.0)
Immature Granulocytes: 0 %
Lymphocytes Relative: 14 %
Lymphs Abs: 1.2 10*3/uL (ref 0.7–4.0)
MCH: 38.7 pg — ABNORMAL HIGH (ref 26.0–34.0)
MCHC: 36.2 g/dL — ABNORMAL HIGH (ref 30.0–36.0)
MCV: 106.9 fL — ABNORMAL HIGH (ref 80.0–100.0)
Monocytes Absolute: 0.7 10*3/uL (ref 0.1–1.0)
Monocytes Relative: 9 %
Neutro Abs: 6.2 10*3/uL (ref 1.7–7.7)
Neutrophils Relative %: 75 %
Platelets: 127 10*3/uL — ABNORMAL LOW (ref 150–400)
RBC: 3.75 MIL/uL — ABNORMAL LOW (ref 4.22–5.81)
RDW: 13.2 % (ref 11.5–15.5)
WBC: 8.2 10*3/uL (ref 4.0–10.5)
nRBC: 0 % (ref 0.0–0.2)

## 2020-04-24 LAB — PHOSPHORUS: Phosphorus: 3 mg/dL (ref 2.5–4.6)

## 2020-04-24 LAB — MAGNESIUM: Magnesium: 2 mg/dL (ref 1.7–2.4)

## 2020-04-25 ENCOUNTER — Ambulatory Visit: Payer: Medicare HMO | Admitting: Hospice and Palliative Medicine

## 2020-04-26 ENCOUNTER — Encounter: Payer: Self-pay | Admitting: Hospice and Palliative Medicine

## 2020-04-30 ENCOUNTER — Other Ambulatory Visit: Payer: Medicare HMO

## 2020-05-01 ENCOUNTER — Ambulatory Visit (INDEPENDENT_AMBULATORY_CARE_PROVIDER_SITE_OTHER): Payer: Medicare HMO | Admitting: Hospice and Palliative Medicine

## 2020-05-01 ENCOUNTER — Encounter: Payer: Self-pay | Admitting: Hospice and Palliative Medicine

## 2020-05-01 ENCOUNTER — Other Ambulatory Visit: Payer: Self-pay

## 2020-05-01 VITALS — BP 136/82 | HR 93 | Temp 98.2°F | Resp 16 | Ht 70.5 in | Wt 222.6 lb

## 2020-05-01 DIAGNOSIS — R600 Localized edema: Secondary | ICD-10-CM | POA: Diagnosis not present

## 2020-05-01 DIAGNOSIS — C61 Malignant neoplasm of prostate: Secondary | ICD-10-CM

## 2020-05-01 DIAGNOSIS — N319 Neuromuscular dysfunction of bladder, unspecified: Secondary | ICD-10-CM

## 2020-05-01 DIAGNOSIS — Z9359 Other cystostomy status: Secondary | ICD-10-CM | POA: Diagnosis not present

## 2020-05-01 MED ORDER — OXYCODONE HCL 5 MG PO TABS: 5.0000 mg | ORAL_TABLET | ORAL | 0 refills | Status: DC | PRN

## 2020-05-01 NOTE — Progress Notes (Signed)
[facility] [address] [place], [place] [address]  Internal MEDICINE  Office Visit Note  Patient Name: Gerald Odonnell  [id number]  [id number]  Date of Service: 05/02/2020  Chief Complaint  Patient presents with  . Follow-up  . Hypertension    HPI Patient is here for routine follow-up 1 week follow-up since starting Furosemide daily for bilateral lower extremity edema Edema has significantly improved Labs reviewed-stable  History of neurogenic bladder and suprapubic catheter--he complains today that the pain he is having from bladder spasms is becoming unbearable, he has been taking oxybutynin more than prescribed due to significant pain, again discussed importance of urology follow-up He was not please with his last urologist and does not want to have to undergo another cystoscopy   Current Medication: Outpatient Encounter Medications as of 05/01/2020  Medication Sig  . abiraterone acetate (ZYTIGA) 250 MG tablet TAKE 4 TABLETS (1,000 MG TOTAL) BY MOUTH DAILY. TAKE ON AN EMPTY STOMACH 1 HOUR BEFORE OR 2 HOURS AFTER A MEAL.  . furosemide (LASIX) 20 MG tablet Take 1 tablet (20 mg total) by mouth 2 (two) times daily.  . mirabegron ER (MYRBETRIQ) 25 MG TB24 tablet Take 1 tablet (25 mg total) by mouth daily.  Marland Kitchen oxybutynin (DITROPAN) 5 MG tablet Take 1 tablet (5 mg total) by mouth 3 (three) times daily.  Marland Kitchen oxyCODONE (ROXICODONE) 5 MG immediate release tablet Take 1 tablet (5 mg total) by mouth every 4 (four) hours as needed for severe pain.  . predniSONE (DELTASONE) 5 MG tablet TAKE 1 TABLET BY MOUTH ONCE DAILY WITH BREAKFAST   No facility-administered encounter medications on file as of 05/01/2020.    Surgical History: Past Surgical History:  Procedure Laterality Date  . ABDOMINAL SURGERY    . COLON SURGERY    . CYSTOSCOPY WITH LITHOLAPAXY N/A 06/09/2017   Procedure: CYSTOSCOPY WITH LITHOLAPAXY;  Surgeon: Vanna Scotland, MD;  Location: [facility] ORS;  Service: Urology;   Laterality: N/A;  . PROSTATE SURGERY  05/25/2016  . SUPRAPUBIC CATHETER INSERTION      Medical History: Past Medical History:  Diagnosis Date  . Cancer ([facility])    colon  . Cancer ([facility])    bone cancer  . Colon cancer ([facility])    Hx of  . History of kidney stones   . Hypertension   . Prostate cancer ([facility])   . Prostate cancer ([facility])    Hx of   . S/P colon resection   . Shortness of breath dyspnea   . Suprapubic catheter ([facility])   . Urinary retention     Family History: Family History  Problem Relation Age of Onset  . Hypertension Other   . Alcohol abuse Mother   . Heart attack Father   . Prostate cancer Brother   . Breast cancer Sister     Social History   Socioeconomic History  . Marital status: Widowed    Spouse name: Not on file  . Number of children: Not on file  . Years of education: Not on file  . Highest education level: Not on file  Occupational History  . Not on file  Tobacco Use  . Smoking status: Current Every Day Smoker    Packs/day: 0.25    Years: 40.00    Pack years: 10.00    Types: Cigarettes  . Smokeless tobacco: Never Used  . Tobacco comment: 1pack a week  Vaping Use  . Vaping Use: Never used  Substance and Sexual Activity  . Alcohol use: Yes  Alcohol/week: 24.0 standard drinks    Types: 24 Cans of beer per week  . Drug use: No  . Sexual activity: Not on file  Other Topics Concern  . Not on file  Social History Narrative   ** Merged History Encounter **       Social Determinants of Health   Financial Resource Strain: Not on file  Food Insecurity: Not on file  Transportation Needs: Not on file  Physical Activity: Not on file  Stress: Not on file  Social Connections: Not on file  Intimate Partner Violence: Not on file   Review of Systems  Constitutional: Negative for chills, fatigue and unexpected weight change.  HENT: Negative for congestion, postnasal drip, rhinorrhea, sneezing and sore throat.   Eyes: Negative for photophobia,  redness and visual disturbance.  Respiratory: Negative for cough, chest tightness and shortness of breath.   Cardiovascular: Negative for chest pain, palpitations and leg swelling.  Gastrointestinal: Negative for abdominal pain, constipation, diarrhea, nausea and vomiting.  Genitourinary: Negative for dysuria and frequency.       Bladder spasms  Musculoskeletal: Negative for arthralgias, back pain, joint swelling and neck pain.  Skin: Negative for rash.  Neurological: Negative for tremors and numbness.  Hematological: Negative for adenopathy. Does not bruise/bleed easily.  Psychiatric/Behavioral: Negative for behavioral problems (Depression), sleep disturbance and suicidal ideas. The patient is not nervous/anxious.     Vital Signs: BP 136/82   Pulse 93   Temp 98.2 F (36.8 C)   Resp 16   Ht 5' 10.5" (1.791 m)   Wt 222 lb 9.6 oz (101 kg)   SpO2 97%   BMI 31.49 kg/m    Physical Exam Vitals reviewed.  Constitutional:      Appearance: Normal appearance. He is obese.  Cardiovascular:     Rate and Rhythm: Normal rate and regular rhythm.     Pulses: Normal pulses.     Heart sounds: Normal heart sounds.  Pulmonary:     Effort: Pulmonary effort is normal.     Breath sounds: Normal breath sounds.  Genitourinary:    Comments: Suprapubic catheter, dressing applied, appears clean without evidence of skin breakdown  Urine clear, dark yellow, without sediment Musculoskeletal:        General: Normal range of motion.     Cervical back: Normal range of motion.     Right lower leg: 1+ Edema present.     Left lower leg: 1+ Edema present.  Skin:    General: Skin is warm.  Neurological:     General: No focal deficit present.     Mental Status: He is alert and oriented to person, place, and time. Mental status is at baseline.  Psychiatric:        Mood and Affect: Mood normal.        Behavior: Behavior normal.        Thought Content: Thought content normal.        Judgment: Judgment  normal.    Assessment/Plan: 1. Neurogenic bladder Under the agreement short course pain medication to be used as needed with the understanding he is to follow through with urology consult for further evaluation and management Oxycodone will not be routinely prescribed for his pain PDMP reviewed overdose risk 150 - oxyCODONE (ROXICODONE) 5 MG immediate release tablet; Take 1 tablet (5 mg total) by mouth every 4 (four) hours as needed for severe pain.  Dispense: 30 tablet; Refill: 0 - Ambulatory referral to Urology  2. Suprapubic catheter ([facility]) Intact,  clean, urine satisfactory, no evidence of skin breakdown or acute infection Home health nurses care for catheter daily  3. Bilateral lower extremity edema Significant improvement, continue with furosemide daily at this time with monitoring of electrolyte imbalances  4. Prostate cancer [facility]) Followed by oncology, current treatment plan to continue Zytiga  General Counseling: Gerald Odonnell understanding of the findings of todays visit and agrees with plan of treatment. I have discussed any further diagnostic evaluation that may be needed or ordered today. We also reviewed his medications today. he has been encouraged to call the office with any questions or concerns that should arise related to todays visit.    Orders Placed This Encounter  Procedures  . Ambulatory referral to Urology    Meds ordered this encounter  Medications  . oxyCODONE (ROXICODONE) 5 MG immediate release tablet    Sig: Take 1 tablet (5 mg total) by mouth every 4 (four) hours as needed for severe pain.    Dispense:  30 tablet    Refill:  0    Time spent: 30 Minutes Time spent includes review of chart, medications, test results and follow-up plan with the patient.  This patient was seen by Theodoro Grist AGNP-C in Collaboration with Dr Lavera Guise as a part of collaborative care agreement     Tanna Furry. Annisa Mazzarella AGNP-C Internal medicine

## 2020-05-02 ENCOUNTER — Ambulatory Visit: Admit: 2020-05-02 | Payer: Medicare HMO | Admitting: Gastroenterology

## 2020-05-02 ENCOUNTER — Encounter: Admission: RE | Payer: Self-pay | Source: Ambulatory Visit

## 2020-05-02 ENCOUNTER — Encounter: Payer: Self-pay | Admitting: Hospice and Palliative Medicine

## 2020-05-02 ENCOUNTER — Ambulatory Visit: Admission: RE | Admit: 2020-05-02 | Payer: Medicare HMO | Source: Ambulatory Visit | Admitting: Gastroenterology

## 2020-05-02 SURGERY — COLONOSCOPY WITH PROPOFOL
Anesthesia: General

## 2020-05-09 MED FILL — ABIRATERONE ACETATE 250 MG: 250 | 30 days supply | Qty: 120 | Fill #2

## 2020-05-09 MED FILL — predniSONE 5 MG TABS: 5 | 30 days supply | Qty: 30 | Fill #2

## 2020-05-14 ENCOUNTER — Other Ambulatory Visit: Payer: Self-pay

## 2020-05-14 ENCOUNTER — Encounter: Payer: Self-pay | Admitting: *Deleted

## 2020-05-14 DIAGNOSIS — Z85830 Personal history of malignant neoplasm of bone: Secondary | ICD-10-CM | POA: Diagnosis not present

## 2020-05-14 DIAGNOSIS — R1030 Lower abdominal pain, unspecified: Secondary | ICD-10-CM | POA: Diagnosis not present

## 2020-05-14 DIAGNOSIS — Z85038 Personal history of other malignant neoplasm of large intestine: Secondary | ICD-10-CM | POA: Insufficient documentation

## 2020-05-14 DIAGNOSIS — N390 Urinary tract infection, site not specified: Secondary | ICD-10-CM | POA: Diagnosis not present

## 2020-05-14 DIAGNOSIS — I10 Essential (primary) hypertension: Secondary | ICD-10-CM | POA: Insufficient documentation

## 2020-05-14 DIAGNOSIS — F17210 Nicotine dependence, cigarettes, uncomplicated: Secondary | ICD-10-CM | POA: Diagnosis not present

## 2020-05-14 DIAGNOSIS — K4020 Bilateral inguinal hernia, without obstruction or gangrene, not specified as recurrent: Secondary | ICD-10-CM | POA: Diagnosis not present

## 2020-05-14 DIAGNOSIS — N210 Calculus in bladder: Secondary | ICD-10-CM | POA: Diagnosis not present

## 2020-05-14 DIAGNOSIS — Z8546 Personal history of malignant neoplasm of prostate: Secondary | ICD-10-CM | POA: Diagnosis not present

## 2020-05-14 LAB — COMPREHENSIVE METABOLIC PANEL
ALT: 21 U/L (ref 0–44)
AST: 45 U/L — ABNORMAL HIGH (ref 15–41)
Albumin: 4 g/dL (ref 3.5–5.0)
Alkaline Phosphatase: 95 U/L (ref 38–126)
Anion gap: 17 — ABNORMAL HIGH (ref 5–15)
BUN: 10 mg/dL (ref 8–23)
CO2: 25 mmol/L (ref 22–32)
Calcium: 9.1 mg/dL (ref 8.9–10.3)
Chloride: 94 mmol/L — ABNORMAL LOW (ref 98–111)
Creatinine, Ser: 0.72 mg/dL (ref 0.61–1.24)
GFR, Estimated: >60 mL/min (ref >60)
Glucose, Bld: 97 mg/dL (ref 70–99)
Potassium: 3.3 mmol/L — ABNORMAL LOW (ref 3.5–5.1)
Sodium: 136 mmol/L (ref 135–145)
Total Bilirubin: 1.3 mg/dL — ABNORMAL HIGH (ref 0.3–1.2)
Total Protein: 7.6 g/dL (ref 6.5–8.1)

## 2020-05-14 LAB — CBC
HCT: 40.3 % (ref 39.0–52.0)
Hemoglobin: 15.1 g/dL (ref 13.0–17.0)
MCH: 38.8 pg — ABNORMAL HIGH (ref 26.0–34.0)
MCHC: 37.5 g/dL — ABNORMAL HIGH (ref 30.0–36.0)
MCV: 103.6 fL — ABNORMAL HIGH (ref 80.0–100.0)
Platelets: 150 10*3/uL (ref 150–400)
RBC: 3.89 MIL/uL — ABNORMAL LOW (ref 4.22–5.81)
RDW: 12.2 % (ref 11.5–15.5)
WBC: 11.4 10*3/uL — ABNORMAL HIGH (ref 4.0–10.5)
nRBC: 0 % (ref 0.0–0.2)

## 2020-05-14 LAB — LIPASE, BLOOD: Lipase: 44 U/L (ref 11–51)

## 2020-05-14 NOTE — ED Triage Notes (Addendum)
Pt ambulatory to triage.  Pt has suprapubic cath and has pain in right groin. Sx began tonight.  Hx bone cancer colon cancer prostate cancer  Pt unable to sit.  Pt alert, speech clear.

## 2020-05-15 ENCOUNTER — Emergency Department: Payer: Medicare HMO

## 2020-05-15 ENCOUNTER — Emergency Department
Admission: EM | Admit: 2020-05-15 | Discharge: 2020-05-15 | Disposition: A | Discharge status: Home / Self Care | Payer: Medicare HMO | Attending: Emergency Medicine | Admitting: Emergency Medicine

## 2020-05-15 DIAGNOSIS — N390 Urinary tract infection, site not specified: Secondary | ICD-10-CM | POA: Diagnosis not present

## 2020-05-15 DIAGNOSIS — N210 Calculus in bladder: Secondary | ICD-10-CM | POA: Diagnosis not present

## 2020-05-15 DIAGNOSIS — R1030 Lower abdominal pain, unspecified: Secondary | ICD-10-CM | POA: Diagnosis not present

## 2020-05-15 DIAGNOSIS — N3289 Other specified disorders of bladder: Secondary | ICD-10-CM

## 2020-05-15 DIAGNOSIS — Z9359 Other cystostomy status: Secondary | ICD-10-CM

## 2020-05-15 DIAGNOSIS — K4020 Bilateral inguinal hernia, without obstruction or gangrene, not specified as recurrent: Secondary | ICD-10-CM | POA: Diagnosis not present

## 2020-05-15 LAB — URINALYSIS, COMPLETE (UACMP) WITH MICROSCOPIC
Bilirubin Urine: NEGATIVE
Glucose, UA: NEGATIVE mg/dL
Ketones, ur: NEGATIVE mg/dL
Nitrite: POSITIVE — AB
Protein, ur: 30 mg/dL — AB
Specific Gravity, Urine: 1.01 (ref 1.005–1.030)
pH: 7 (ref 5.0–8.0)

## 2020-05-15 MED ORDER — LEVOFLOXACIN 500 MG PO TABS: 500.0000 mg | ORAL_TABLET | Freq: Every day | ORAL | 0 refills | Status: AC

## 2020-05-15 MED ORDER — ONDANSETRON HCL 4 MG/2ML IJ SOLN
4.0000 mg | Freq: Once | INTRAMUSCULAR | Status: AC
  Administered 2020-05-15: 4 mg via INTRAVENOUS
  Filled 2020-05-15: qty 2

## 2020-05-15 MED ORDER — OXYCODONE-ACETAMINOPHEN 5-325 MG PO TABS
1.0000 | ORAL_TABLET | Freq: Once | ORAL | Status: AC
  Administered 2020-05-15: 1 via ORAL
  Filled 2020-05-15: qty 1

## 2020-05-15 MED ORDER — LACTATED RINGERS IV BOLUS
500.0000 mL | Freq: Once | INTRAVENOUS | Status: AC
  Administered 2020-05-15: 500 mL via INTRAVENOUS

## 2020-05-15 MED ORDER — OXYBUTYNIN CHLORIDE 5 MG PO TABS: 5.0000 mg | ORAL_TABLET | Freq: Three times a day (TID) | ORAL | 0 refills | Status: DC

## 2020-05-15 MED ORDER — LEVOFLOXACIN IN D5W 500 MG/100ML IV SOLN
500.0000 mg | Freq: Once | INTRAVENOUS | Status: AC
  Administered 2020-05-15: 500 mg via INTRAVENOUS
  Filled 2020-05-15: qty 100

## 2020-05-15 MED ORDER — OXYCODONE-ACETAMINOPHEN 5-325 MG PO TABS: 1.0000 | ORAL_TABLET | ORAL | 0 refills | Status: DC | PRN

## 2020-05-15 MED ORDER — IOHEXOL 300 MG/ML  SOLN
100.0000 mL | Freq: Once | INTRAMUSCULAR | Status: AC | PRN
  Administered 2020-05-15: 100 mL via INTRAVENOUS

## 2020-05-15 MED ORDER — HYDROMORPHONE HCL 1 MG/ML IJ SOLN
1.0000 mg | Freq: Once | INTRAMUSCULAR | Status: AC
  Administered 2020-05-15: 1 mg via INTRAVENOUS
  Filled 2020-05-15: qty 1

## 2020-05-15 MED ORDER — FENTANYL CITRATE (PF) 100 MCG/2ML IJ SOLN
50.0000 ug | Freq: Once | INTRAMUSCULAR | Status: AC
  Administered 2020-05-15: 50 ug via INTRAVENOUS
  Filled 2020-05-15: qty 2

## 2020-05-15 NOTE — ED Notes (Signed)
Bladder scan result 20 ml

## 2020-05-15 NOTE — ED Notes (Signed)
Pt presents to ED with c/o of having increasing ABD "colon" pain that started yesterday at 1400. Pt states HX of colon cancer. Pt is continuously grabbing his penis and stating that hurts as well. Pt has a supra pubic cathter in place and pt states it has been draining as normal. Pt states chills but states did not take his temp. Pt denies N/V/D. Pt is A&Ox4. NAD noted.

## 2020-05-15 NOTE — ED Notes (Signed)
D/C, new RX, and f/up discussed with pt, pt verbalized understanding. NAD noted. Pt ambulatory on D/C.

## 2020-05-15 NOTE — ED Notes (Signed)
Pt wont keep BP cuff and monitoring equipment on at this time, due to "I need to walk around the room, it helps with my pain".

## 2020-05-15 NOTE — ED Provider Notes (Signed)
[facility] Emergency Department Provider Note  Time seen: 7:34 AM  I have reviewed the triage vital signs and the nursing notes.   HISTORY  Chief Complaint Suprapubic pain  HPI Gerald Odonnell is a 65 y.o. male with a past medical history of colon cancer, prostate cancer status post suprapubic catheter, hypertension, presents to the emergency department for suprapubic pain.  According to the patient since 4 PM yesterday he has been experiencing severe sharp pain in the suprapubic area.  States it feels like it is radiating up the head of his penis.  Patient's catheter is draining appropriately.  States 2 episodes of nausea and vomiting due to the pain per patient.  No diarrhea.  No known fever.  Past Medical History:  Diagnosis Date  . Cancer ([place])    colon  . Cancer ([place])    bone cancer  . Colon cancer ([place])    Hx of  . History of kidney stones   . Hypertension   . Prostate cancer ([place])   . Prostate cancer ([place])    Hx of   . S/P colon resection   . Shortness of breath dyspnea   . Suprapubic catheter ([place])   . Urinary retention     Patient Active Problem List   Diagnosis Date Noted  . Personal history of colon cancer 02/05/2020  . Closed bimalleolar fracture 05/05/2018  . Benign hypertension 08/22/2017  . Encounter for screening colonoscopy 08/22/2017  . Dysuria 08/22/2017  . Urinary retention 03/15/2017  . Prostate cancer ([place]) 03/15/2017  . Abscess 01/24/2016  . Sepsis ([place]) 09/10/2015  . Chest pain 09/10/2015  . 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 Clostridium difficile diarrhea   . Colitis, infectious 04/23/2015  . Incomplete bladder emptying 09/10/2014  . Edema leg 09/15/2013  . Pelvic and perineal pain 09/15/2013  . Lower extremity venous stasis 09/15/2013  . Pelvic pain in male 09/15/2013  . Benign prostatic hyperplasia with urinary obstruction 07/22/2013    Past Surgical History:  Procedure Laterality Date  . ABDOMINAL SURGERY    .  COLON SURGERY    . CYSTOSCOPY WITH LITHOLAPAXY N/A 06/09/2017   Procedure: CYSTOSCOPY WITH LITHOLAPAXY;  Surgeon: Hollice Espy, MD;  Location: [facility] ORS;  Service: Urology;  Laterality: N/A;  . PROSTATE SURGERY  05/25/2016  . SUPRAPUBIC CATHETER INSERTION      Prior to Admission medications   Medication Sig Start Date End Date Taking? Authorizing Provider  abiraterone acetate (ZYTIGA) 250 MG tablet TAKE 4 TABLETS (1,000 MG TOTAL) BY MOUTH DAILY. TAKE ON AN EMPTY STOMACH 1 HOUR BEFORE OR 2 HOURS AFTER A MEAL. 03/04/20   Lloyd Huger, MD  furosemide (LASIX) 20 MG tablet Take 1 tablet (20 mg total) by mouth 2 (two) times daily. 04/17/20   Luiz Ochoa, NP  mirabegron ER (MYRBETRIQ) 25 MG TB24 tablet Take 1 tablet (25 mg total) by mouth daily. 01/17/20   Luiz Ochoa, NP  oxybutynin (DITROPAN) 5 MG tablet Take 1 tablet (5 mg total) by mouth 3 (three) times daily. 04/17/20   Luiz Ochoa, NP  oxyCODONE (ROXICODONE) 5 MG immediate release tablet Take 1 tablet (5 mg total) by mouth every 4 (four) hours as needed for severe pain. 05/01/20   Luiz Ochoa, NP  predniSONE (DELTASONE) 5 MG tablet TAKE 1 TABLET BY MOUTH ONCE DAILY WITH BREAKFAST 03/04/20   Lloyd Huger, MD    No Known Allergies  Family History  Problem Relation Age of Onset  .  Hypertension Other   . Alcohol abuse Mother   . Heart attack Father   . Prostate cancer Brother   . Breast cancer Sister     Social History Social History   Tobacco Use  . Smoking status: Current Every Day Smoker    Packs/day: 0.25    Years: 40.00    Pack years: 10.00    Types: Cigarettes  . Smokeless tobacco: Never Used  . Tobacco comment: 1pack a week  Vaping Use  . Vaping Use: Never used  Substance Use Topics  . Alcohol use: Yes  . Drug use: No    Review of Systems Constitutional: Negative for fever. Cardiovascular: Negative for chest pain. Respiratory: Negative for shortness of breath. Gastrointestinal:  Positive for lower abdominal/suprapubic pain, severe.  Positive nausea vomiting x2. Genitourinary: Suprapubic catheter draining appropriately.  Pain radiates to his penis. Musculoskeletal: Negative for musculoskeletal complaints Neurological: Negative for headache All other ROS negative  ____________________________________________   PHYSICAL EXAM:  VITAL SIGNS: ED Triage Vitals  Enc Vitals Group     BP 05/14/20 2230 (!) 160/112     Pulse Rate 05/14/20 2230 (!) 118     Resp 05/14/20 2230 (!) 22     Temp 05/14/20 2230 99.1 F (37.3 C)     Temp Source 05/14/20 2230 Oral     SpO2 05/14/20 2230 97 %     Weight 05/14/20 2226 222 lb (100.7 kg)     Height 05/14/20 2226 5\' 10"  (1.778 m)     Head Circumference --      Peak Flow --      Pain Score 05/14/20 2226 10     Pain Loc --      Pain Edu? --      Excl. in [place]? --    Constitutional: Alert and oriented.  Overall well-appearing.  No acute distress.  Did become tearful at one time when talking about his wait in the waiting room. Eyes: Normal exam ENT      Head: Normocephalic and atraumatic.      Mouth/Throat: Mucous membranes are moist. Cardiovascular: Normal rate, regular rhythm.  Respiratory: Normal respiratory effort without tachypnea nor retractions. Breath sounds are clear  Gastrointestinal: Soft, mild lower abdominal tenderness mostly around the suprapubic catheter mostly in the suprapubic area.  No rebound or guarding.  No distention.  Well-appearing suprapubic catheter insertion site. Genitourinary: Normal-appearing GU exam. Musculoskeletal: Nontender with normal range of motion in all extremities Neurologic:  Normal speech and language. No gross focal neurologic deficits  Skin:  Skin is warm, dry and intact.  Psychiatric: Mood and affect are normal.  ____________________________________________   RADIOLOGY  IMPRESSION:  1. Small right greater than left fat containing inguinal hernias.  2. Suprapubic catheter in  place with unchanged size of the bladder  calculus in the posterior aspect the urinary bladder. No  hydronephrosis.  3. No significant change in the subcentimeter right lower lobe  pulmonary nodules, with a new nonspecific 5 mm ground-glass  pulmonary nodule in the right middle lobe.  4. Similar widespread sclerotic osseous metastatic disease  throughout the visualized skeleton.  5. Cirrhotic hepatic morphology with stable likely reactive  prominent/borderline enlarged periportal/pericaval lymph nodes.  6. Stable small bilateral adrenal nodules, which are likely benign.  7. Aortic atherosclerosis.   ____________________________________________   INITIAL IMPRESSION / ASSESSMENT AND PLAN / ED COURSE  Pertinent labs & imaging results that were available during my care of the patient were reviewed by me and considered in my  medical decision making (see chart for details).   Patient presents to the emergency department for lower abdominal pain.  Patient has a history of metastatic cancer including colon and prostate.  Differential is quite broad at this time but could include urinary tract infection, prostate infection, intra-abdominal or pelvic abscess, other intra-abdominal pathology.  We will proceed with CT imaging.  Lab work is largely nonrevealing slight leukocytosis at 11,400.  Patient appears to be mildly dehydrated as well we will hydrate with fluids treat pain, nausea, obtain CT imaging.  Patient agreeable to plan of care.  CT scan shows largely stable CT imaging with known metastatic disease.  Patient has urinary tract infection likely bladder spasms as well.  We will discharge with antibiotics and oxybutynin (patient out of this medication).  Patient agreeable to plan of care.  No significant or new findings on CT besides a pulmonary nodule.  Patient known to have diffuse metastatic disease.  Patient's urinalysis although from a chronic indwelling catheter does show significant  infection.  We will cover with Levaquin for any possible prostatic infection.  We will discharge with the same.  I have added a urine culture.  Lorenso K Odonnell was evaluated in Emergency Department on 05/15/2020 for the symptoms described in the history of present illness. He was evaluated in the context of the global COVID-19 pandemic, which necessitated consideration that the patient might be at risk for infection with the SARS-CoV-2 virus that causes COVID-19. Institutional protocols and algorithms that pertain to the evaluation of patients at risk for COVID-19 are in a state of rapid change based on information released by regulatory bodies including the CDC and federal and state organizations. These policies and algorithms were followed during the patient's care in the ED.  ____________________________________________   FINAL CLINICAL IMPRESSION(S) / ED DIAGNOSES  Suprapubic pain Urinary tract infection   Harvest Dark, MD 05/16/20 2020

## 2020-05-15 NOTE — ED Triage Notes (Signed)
Emergency Medicine Provider Triage Evaluation Note  Gerald Odonnell , a 65 y.o. male  was evaluated in triage.  Pt complains of suprapubic sharp constant severe pain associated with nausea and vomiting that started at 4AM. Has had dysuria as well. Patient has indwelling suprapubic catheter  Review of Systems  Positive: + suprapubic abd pain, dysuria, nausea, vomiting Negative: CP, SOB, fever, diarrhea  Physical Exam  BP (!) 171/88 (BP Location: Left Arm)   Pulse 95   Temp 97.7 F (36.5 C) (Oral)   Resp 18   Ht 5\' 10"  (1.778 m)   Wt 100.7 kg   SpO2 97%   BMI 31.85 kg/m  Gen:   Awake, in mild distress due to pain HEENT:  Atraumatic  Resp:  Normal effort  Cardiac:  Normal rate  Abd:   Nondistended, mild suprapubic tenderness MSK:   Moves extremities without difficulty  Neuro:  Speech clear   Medical Decision Making  Medically screening exam initiated at 6:57 AM.  Appropriate orders placed.  Gerald Odonnell was informed that the remainder of the evaluation will be completed by another provider, this initial triage assessment does not replace that evaluation, and the importance of remaining in the ED until their evaluation is complete.  Clinical Impression  Suprapubic abd pain, nausea, dysuria, vomiting.  Ddx UTI, obstructed catheter, prostatitis, diverticulitis, complications from patient's cancer  Plan: Fentanyl, zofran, CT, UA pending.   Gerald Odonnell, [place], MD 05/15/20 [phone]

## 2020-05-17 LAB — URINE CULTURE: Culture: >=100000 — AB

## 2020-05-23 ENCOUNTER — Telehealth: Payer: Self-pay | Admitting: *Deleted

## 2020-05-23 NOTE — Telephone Encounter (Signed)
Received home health orders regarding SPT changes, has not been seen in our office in one year. Attempted to schedule follow up in our office-patient declined will be going to [facility].

## 2020-06-04 ENCOUNTER — Telehealth: Payer: Self-pay

## 2020-06-04 DIAGNOSIS — Z9359 Other cystostomy status: Secondary | ICD-10-CM

## 2020-06-04 MED ORDER — OXYBUTYNIN CHLORIDE 5 MG PO TABS: 5.0000 mg | ORAL_TABLET | Freq: Three times a day (TID) | ORAL | 0 refills | Status: DC

## 2020-06-04 NOTE — Telephone Encounter (Signed)
Returned patients call in regards to rescheduling his colonoscopy.  LVM for him to call office back to reschedule.  Thanks,  Sharyn Lull, CMA  Scheduling Note:  This is a reschedule from earlier this year.  Colonoscopy dx: Personal history of colon cancer Z85.038, screening colonoscopy z12.11 [facility] Dr. Vicente Males 05/02/20 ([place])

## 2020-06-04 NOTE — Telephone Encounter (Signed)
Refill done.  

## 2020-06-06 MED FILL — predniSONE 5 MG TABS: 5 | 30 days supply | Qty: 30 | Fill #3

## 2020-06-06 MED FILL — ABIRATERONE ACETATE 250 MG: 250 | 30 days supply | Qty: 120 | Fill #3

## 2020-06-07 ENCOUNTER — Other Ambulatory Visit: Payer: Self-pay | Admitting: Hospice and Palliative Medicine

## 2020-06-07 DIAGNOSIS — R600 Localized edema: Secondary | ICD-10-CM

## 2020-06-12 ENCOUNTER — Telehealth: Payer: Self-pay

## 2020-06-12 ENCOUNTER — Encounter: Payer: Self-pay | Admitting: Hospice and Palliative Medicine

## 2020-06-12 ENCOUNTER — Ambulatory Visit (INDEPENDENT_AMBULATORY_CARE_PROVIDER_SITE_OTHER): Payer: Medicare HMO | Admitting: Hospice and Palliative Medicine

## 2020-06-12 ENCOUNTER — Other Ambulatory Visit: Payer: Self-pay

## 2020-06-12 VITALS — BP 140/80 | HR 85 | Temp 97.4°F | Resp 16 | Ht 70.5 in | Wt 222.4 lb

## 2020-06-12 DIAGNOSIS — C61 Malignant neoplasm of prostate: Secondary | ICD-10-CM

## 2020-06-12 DIAGNOSIS — R600 Localized edema: Secondary | ICD-10-CM | POA: Diagnosis not present

## 2020-06-12 DIAGNOSIS — Z9359 Other cystostomy status: Secondary | ICD-10-CM | POA: Diagnosis not present

## 2020-06-12 NOTE — Progress Notes (Signed)
[facility] [place], [place] [address]  Internal MEDICINE  Office Visit Note  Patient Name: Gerald Odonnell  [id number]  [id number]  Date of Service: 06/15/2020  Chief Complaint  Patient presents with  . Follow-up  . Hypertension    HPI Patient is here for routine follow-up Has continued to take furosemide daily for bilateral leg swelling--swelling has improved but legs do continue to swell more in the evenings Home health nurses still coming out to his house to help with suprapubic catheter changes, urine has remained clear without infection Has experienced 2 falls since our last visit, tripped over his feet, denies any injuries, denies syncope Using his cane for ambulation assistance, has a walker for assistance but too bulky to get around his home  Current Medication: Outpatient Encounter Medications as of 06/12/2020  Medication Sig  . abiraterone acetate (ZYTIGA) 250 MG tablet TAKE 4 TABLETS (1,000 MG TOTAL) BY MOUTH DAILY. TAKE ON AN EMPTY STOMACH 1 HOUR BEFORE OR 2 HOURS AFTER A MEAL.  . furosemide (LASIX) 20 MG tablet TAKE ONE TABLET BY MOUTH TWICE DAILY  . mirabegron ER (MYRBETRIQ) 25 MG TB24 tablet Take 1 tablet (25 mg total) by mouth daily.  Marland Kitchen oxybutynin (DITROPAN) 5 MG tablet Take 1 tablet (5 mg total) by mouth 3 (three) times daily.  . predniSONE (DELTASONE) 5 MG tablet TAKE 1 TABLET BY MOUTH ONCE DAILY WITH BREAKFAST  . [DISCONTINUED] oxyCODONE (ROXICODONE) 5 MG immediate release tablet Take 1 tablet (5 mg total) by mouth every 4 (four) hours as needed for severe pain.  . [DISCONTINUED] oxyCODONE-acetaminophen (PERCOCET) 5-325 MG tablet Take 1 tablet by mouth every 4 (four) hours as needed for severe pain.   No facility-administered encounter medications on file as of 06/12/2020.    Surgical History: Past Surgical History:  Procedure Laterality Date  . ABDOMINAL SURGERY    . COLON SURGERY    . CYSTOSCOPY WITH LITHOLAPAXY N/A 06/09/2017    Procedure: CYSTOSCOPY WITH LITHOLAPAXY;  Surgeon: Gerald Espy, MD;  Location: [facility] ORS;  Service: Urology;  Laterality: N/A;  . PROSTATE SURGERY  05/25/2016  . SUPRAPUBIC CATHETER INSERTION      Medical History: Past Medical History:  Diagnosis Date  . Cancer ([place])    colon  . Cancer ([place])    bone cancer  . Colon cancer ([place])    Hx of  . History of kidney stones   . Hypertension   . Prostate cancer ([place])   . Prostate cancer ([place])    Hx of   . S/P colon resection   . Shortness of breath dyspnea   . Suprapubic catheter ([place])   . Urinary retention     Family History: Family History  Problem Relation Age of Onset  . Hypertension Other   . Alcohol abuse Mother   . Heart attack Father   . Prostate cancer Brother   . Breast cancer Sister     Social History   Socioeconomic History  . Marital status: Widowed    Spouse name: Not on file  . Number of children: Not on file  . Years of education: Not on file  . Highest education level: Not on file  Occupational History  . Not on file  Tobacco Use  . Smoking status: Current Every Day Smoker    Packs/day: 0.25    Years: 40.00    Pack years: 10.00    Types: Cigarettes  . Smokeless tobacco: Never Used  . Tobacco comment: 1pack a  week  Vaping Use  . Vaping Use: Never used  Substance and Sexual Activity  . Alcohol use: Yes  . Drug use: No  . Sexual activity: Not on file  Other Topics Concern  . Not on file  Social History Narrative   ** Merged History Encounter **       Social Determinants of Health   Financial Resource Strain: Not on file  Food Insecurity: Not on file  Transportation Needs: Not on file  Physical Activity: Not on file  Stress: Not on file  Social Connections: Not on file  Intimate Partner Violence: Not on file   Review of Systems  Constitutional: Negative for chills, fatigue and unexpected weight change.  HENT: Negative for congestion, postnasal drip, rhinorrhea, sneezing and sore throat.    Eyes: Negative for redness.  Respiratory: Negative for cough, chest tightness and shortness of breath.   Cardiovascular: Negative for chest pain and palpitations.  Gastrointestinal: Negative for abdominal pain, constipation, diarrhea, nausea and vomiting.  Genitourinary: Negative for dysuria and frequency.  Musculoskeletal: Negative for arthralgias, back pain, joint swelling and neck pain.  Skin: Negative for rash.  Neurological: Negative for tremors and numbness.  Hematological: Negative for adenopathy. Does not bruise/bleed easily.  Psychiatric/Behavioral: Negative for behavioral problems (Depression), sleep disturbance and suicidal ideas. The patient is not nervous/anxious.     Vital Signs: BP 140/80   Pulse 85   Temp (!) 97.4 F (36.3 C)   Resp 16   Ht 5' 10.5" (1.791 m)   Wt 222 lb 6.4 oz (100.9 kg)   SpO2 97%   BMI 31.46 kg/m    Physical Exam Vitals reviewed.  Constitutional:      Appearance: Normal appearance. He is normal weight.  Cardiovascular:     Rate and Rhythm: Normal rate and regular rhythm.     Pulses: Normal pulses.     Heart sounds: Normal heart sounds.  Pulmonary:     Effort: Pulmonary effort is normal.     Breath sounds: Normal breath sounds.  Abdominal:     General: Abdomen is flat.     Palpations: Abdomen is soft.  Musculoskeletal:        General: Normal range of motion.     Cervical back: Normal range of motion.  Skin:    General: Skin is warm.  Neurological:     General: No focal deficit present.     Mental Status: He is alert and oriented to person, place, and time. Mental status is at baseline.  Psychiatric:        Mood and Affect: Mood normal.        Behavior: Behavior normal.        Thought Content: Thought content normal.        Judgment: Judgment normal.    Assessment/Plan: 1. Bilateral lower extremity edema Discussed compression stockings will look into having home health aids assist home with this Continue with furosemide  daily - Ambulatory referral to [place]  2. Suprapubic catheter (HCC) Stable, clean without skin breakdown  3. Prostate cancer ([place]) Stable, followed by oncology, currently on Zytiga for treatment  General Counseling: Gerald Odonnell understanding of the findings of todays visit and agrees with plan of treatment. I have discussed any further diagnostic evaluation that may be needed or ordered today. We also reviewed his medications today. he has been encouraged to call the office with any questions or concerns that should arise related to todays visit.    Orders Placed This Encounter  Procedures  . Ambulatory referral to Home Health      Time spent: 30 Minutes   This patient was seen by Theodoro Grist AGNP-C in Collaboration with Dr Lavera Guise as a part of collaborative care agreement     Tanna Furry. Shaliah Wann AGNP-C Internal medicine

## 2020-06-12 NOTE — Telephone Encounter (Signed)
Patient returned call to the office to schedule his colonoscopy.  He said he would like to reschedule it sometime in April, when he is feeling more up to it.  He said he had a fall recently and other health problems and would like to hold off right now.  I told him he could call us back in April to schedule.  Thanks,  [place], [place]

## 2020-06-15 ENCOUNTER — Encounter: Payer: Self-pay | Admitting: Hospice and Palliative Medicine

## 2020-06-21 ENCOUNTER — Ambulatory Visit: Payer: Medicare HMO | Admitting: Physician Assistant

## 2020-06-24 ENCOUNTER — Other Ambulatory Visit: Payer: Self-pay

## 2020-06-24 ENCOUNTER — Ambulatory Visit (INDEPENDENT_AMBULATORY_CARE_PROVIDER_SITE_OTHER): Payer: Medicare HMO | Admitting: Physician Assistant

## 2020-06-24 ENCOUNTER — Encounter: Payer: Self-pay | Admitting: *Deleted

## 2020-06-24 ENCOUNTER — Encounter: Payer: Self-pay | Admitting: Physician Assistant

## 2020-06-24 VITALS — BP 130/80 | HR 74 | Ht 70.0 in | Wt 222.0 lb

## 2020-06-24 DIAGNOSIS — N319 Neuromuscular dysfunction of bladder, unspecified: Secondary | ICD-10-CM | POA: Diagnosis not present

## 2020-06-24 NOTE — Progress Notes (Signed)
06/24/2020 1:09 PM   Gerald Odonnell 05/02/1955 [id number]  CC: Chief Complaint  Patient presents with  . Other    HPI: Gerald Odonnell is a 65 y.o. male with PMH metastatic prostate cancer and neurogenic bladder managed with SPT who presents today for routine follow-up.  He undergoes monthly suprapubic catheter changes with home health nursing and requests repeat orders today.  He states this is been going quite well for him and he is very grateful for the help he has received from his nurses.  He wishes to continue home SP tube changes and reports only one urinary tract infection in the past year.  He continues to decline recommended cystoscopy.  PMH: Past Medical History:  Diagnosis Date  . Cancer ([place])    colon  . Cancer ([place])    bone cancer  . Colon cancer ([place])    Hx of  . History of kidney stones   . Hypertension   . Prostate cancer ([place])   . Prostate cancer ([place])    Hx of   . S/P colon resection   . Shortness of breath dyspnea   . Suprapubic catheter ([place])   . Urinary retention     Surgical History: Past Surgical History:  Procedure Laterality Date  . ABDOMINAL SURGERY    . COLON SURGERY    . CYSTOSCOPY WITH LITHOLAPAXY N/A 06/09/2017   Procedure: CYSTOSCOPY WITH LITHOLAPAXY;  Surgeon: Hollice Espy, MD;  Location: [facility] ORS;  Service: Urology;  Laterality: N/A;  . PROSTATE SURGERY  05/25/2016  . SUPRAPUBIC CATHETER INSERTION      Home Medications:  Allergies as of 06/24/2020   No Known Allergies     Medication List       Accurate as of June 24, 2020  1:09 PM. If you have any questions, ask your nurse or doctor.        abiraterone acetate 250 MG tablet Commonly known as: ZYTIGA TAKE 4 TABLETS (1,000 MG TOTAL) BY MOUTH DAILY. TAKE ON AN EMPTY STOMACH 1 HOUR BEFORE OR 2 HOURS AFTER A MEAL.   furosemide 20 MG tablet Commonly known as: LASIX TAKE ONE TABLET BY MOUTH TWICE DAILY   mirabegron ER 25 MG Tb24 tablet Commonly known as:  MYRBETRIQ Take 1 tablet (25 mg total) by mouth daily.   oxybutynin 5 MG tablet Commonly known as: DITROPAN Take 1 tablet (5 mg total) by mouth 3 (three) times daily.   predniSONE 5 MG tablet Commonly known as: DELTASONE TAKE 1 TABLET BY MOUTH ONCE DAILY WITH BREAKFAST       Allergies:  No Known Allergies  Family History: Family History  Problem Relation Age of Onset  . Hypertension Other   . Alcohol abuse Mother   . Heart attack Father   . Prostate cancer Brother   . Breast cancer Sister     Social History:   reports that he has been smoking cigarettes. He has a 10.00 pack-year smoking history. He has never used smokeless tobacco. He reports current alcohol use. He reports that he does not use drugs.  Physical Exam: BP 130/80   Pulse 74   Ht 5\' 10"  (1.778 m)   Wt 222 lb (100.7 kg)   BMI 31.85 kg/m   Constitutional:  Alert and oriented, no acute distress, nontoxic appearing HEENT: [place], AT Cardiovascular: No clubbing, cyanosis, or edema Respiratory: Normal respiratory effort, no increased work of breathing GU: SP tube insertion site visualized, well-healing, without erythema or purulence.  SP tube draining  clear, dark yellow urine. Skin: No rashes, bruises or suspicious lesions Neurologic: Grossly intact, no focal deficits, moving all 4 extremities, ambulates with the assistance of a cane Psychiatric: Normal mood and affect  Assessment & Plan:   1. Neurogenic bladder SP tube in good condition today, patient tolerating home health exchanges well with no concerns.  Will provide orders for ongoing monthly SP tube exchanges using a 16 French Foley catheter to leg bag drainage.  Letter provided for new orders today.  Return if symptoms worsen or fail to improve.  Debroah Loop, PA-C  [facility] [address], [place], [place] [address] [phone]
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
## 2020-07-04 ENCOUNTER — Other Ambulatory Visit: Payer: Self-pay | Admitting: Oncology

## 2020-07-04 DIAGNOSIS — C61 Malignant neoplasm of prostate: Secondary | ICD-10-CM

## 2020-07-04 MED FILL — predniSONE 5 MG TABS: 5 | 30 days supply | Qty: 30 | Fill #0

## 2020-07-23 ENCOUNTER — Other Ambulatory Visit (HOSPITAL_COMMUNITY): Payer: Self-pay

## 2020-07-29 ENCOUNTER — Inpatient Hospital Stay: Payer: Medicare HMO | Attending: Oncology

## 2020-07-29 ENCOUNTER — Other Ambulatory Visit: Payer: Self-pay

## 2020-07-29 ENCOUNTER — Inpatient Hospital Stay: Payer: Medicare HMO

## 2020-07-29 ENCOUNTER — Encounter: Payer: Self-pay | Admitting: Oncology

## 2020-07-29 ENCOUNTER — Inpatient Hospital Stay (HOSPITAL_BASED_OUTPATIENT_CLINIC_OR_DEPARTMENT_OTHER): Payer: Medicare HMO | Admitting: Oncology

## 2020-07-29 VITALS — BP 95/79 | HR 89 | Temp 99.2°F | Wt 212.0 lb

## 2020-07-29 DIAGNOSIS — Z803 Family history of malignant neoplasm of breast: Secondary | ICD-10-CM | POA: Diagnosis not present

## 2020-07-29 DIAGNOSIS — C61 Malignant neoplasm of prostate: Secondary | ICD-10-CM | POA: Insufficient documentation

## 2020-07-29 DIAGNOSIS — Z85038 Personal history of other malignant neoplasm of large intestine: Secondary | ICD-10-CM | POA: Insufficient documentation

## 2020-07-29 DIAGNOSIS — F17210 Nicotine dependence, cigarettes, uncomplicated: Secondary | ICD-10-CM | POA: Diagnosis not present

## 2020-07-29 DIAGNOSIS — C7951 Secondary malignant neoplasm of bone: Secondary | ICD-10-CM | POA: Diagnosis not present

## 2020-07-29 DIAGNOSIS — E876 Hypokalemia: Secondary | ICD-10-CM | POA: Diagnosis not present

## 2020-07-29 LAB — CBC WITH DIFFERENTIAL/PLATELET
Abs Immature Granulocytes: 0.03 10*3/uL (ref 0.00–0.07)
Basophils Absolute: 0.1 10*3/uL (ref 0.0–0.1)
Basophils Relative: 1 %
Eosinophils Absolute: 0 10*3/uL (ref 0.0–0.5)
Eosinophils Relative: 0 %
HCT: 39.6 % (ref 39.0–52.0)
Hemoglobin: 14.3 g/dL (ref 13.0–17.0)
Immature Granulocytes: 0 %
Lymphocytes Relative: 14 %
Lymphs Abs: 1.2 10*3/uL (ref 0.7–4.0)
MCH: 38.8 pg — ABNORMAL HIGH (ref 26.0–34.0)
MCHC: 36.1 g/dL — ABNORMAL HIGH (ref 30.0–36.0)
MCV: 107.3 fL — ABNORMAL HIGH (ref 80.0–100.0)
Monocytes Absolute: 0.7 10*3/uL (ref 0.1–1.0)
Monocytes Relative: 8 %
Neutro Abs: 6.8 10*3/uL (ref 1.7–7.7)
Neutrophils Relative %: 77 %
Platelets: 114 10*3/uL — ABNORMAL LOW (ref 150–400)
RBC: 3.69 MIL/uL — ABNORMAL LOW (ref 4.22–5.81)
RDW: 13.5 % (ref 11.5–15.5)
WBC: 8.7 10*3/uL (ref 4.0–10.5)
nRBC: 0 % (ref 0.0–0.2)

## 2020-07-29 LAB — COMPREHENSIVE METABOLIC PANEL
ALT: 27 U/L (ref 0–44)
AST: 42 U/L — ABNORMAL HIGH (ref 15–41)
Albumin: 3.7 g/dL (ref 3.5–5.0)
Alkaline Phosphatase: 117 U/L (ref 38–126)
Anion gap: 12 (ref 5–15)
BUN: 7 mg/dL — ABNORMAL LOW (ref 8–23)
CO2: 26 mmol/L (ref 22–32)
Calcium: 8.8 mg/dL — ABNORMAL LOW (ref 8.9–10.3)
Chloride: 95 mmol/L — ABNORMAL LOW (ref 98–111)
Creatinine, Ser: 0.5 mg/dL — ABNORMAL LOW (ref 0.61–1.24)
GFR, Estimated: >60 mL/min (ref >60)
Glucose, Bld: 108 mg/dL — ABNORMAL HIGH (ref 70–99)
Potassium: 3.1 mmol/L — ABNORMAL LOW (ref 3.5–5.1)
Sodium: 133 mmol/L — ABNORMAL LOW (ref 135–145)
Total Bilirubin: 1.1 mg/dL (ref 0.3–1.2)
Total Protein: 7.1 g/dL (ref 6.5–8.1)

## 2020-07-29 LAB — MAGNESIUM: Magnesium: 1.9 mg/dL (ref 1.7–2.4)

## 2020-07-29 LAB — PHOSPHORUS: Phosphorus: 2.6 mg/dL (ref 2.5–4.6)

## 2020-07-29 MED ORDER — POTASSIUM CHLORIDE CRYS ER 20 MEQ PO TBCR: 20.0000 meq | EXTENDED_RELEASE_TABLET | Freq: Two times a day (BID) | ORAL | 0 refills | Status: DC

## 2020-07-29 MED ORDER — IBUPROFEN 800 MG PO TABS: 800.0000 mg | ORAL_TABLET | Freq: Three times a day (TID) | ORAL | 0 refills | Status: DC | PRN

## 2020-07-29 NOTE — Progress Notes (Signed)
[place]  [phone] [phone]  ID: Gerald Odonnell OB: Feb 25, 1956  MR#: [id number]  MWN#:[id number]  Patient Care Team: Lavera Guise, MD as PCP - General (Internal Medicine) Ronnell Freshwater, NP (Family Medicine) Lloyd Huger, MD as Consulting Physician (Oncology)  CHIEF COMPLAINT: Prostate cancer metastatic to bone  INTERVAL HISTORY: Patient returns to clinic today for repeat laboratory work and routine 71-month evaluation.  He was last seen in clinic on 04/24/2020.  He was evaluated on 05/15/2020 for sharp lower abdominal pain in [facility] ED.  Given history, CT scan of abdomen pelvis was ordered revealing no acute abnormalities.  He was found to have a UTI with bladder spasms.  He was discharged with antibiotics and oxybutynin.  Today, he continues to feel well and is asymptomatic. Reports some chronic joint pain in elbows and knee's. Requires a cane for ambulation. Joint pain worse when is rains and in the morning. He currently is not taking anything for the pain. Denies any new bone pain. Denies any recent falls.  He is tolerating Zytiga and prednisone well without any significant side effects.  Denies any new concerns with his suprapubic catheter.  REVIEW OF SYSTEMS:   Review of Systems  Constitutional: Positive for malaise/fatigue. Negative for chills, fever and weight loss.  HENT: Negative for congestion, ear pain and tinnitus.   Eyes: Negative.  Negative for blurred vision and double vision.  Respiratory: Negative.  Negative for cough, sputum production and shortness of breath.   Cardiovascular: Negative.  Negative for chest pain, palpitations and leg swelling.  Gastrointestinal: Negative.  Negative for abdominal pain, constipation, diarrhea, nausea and vomiting.  Genitourinary: Negative for dysuria, frequency and urgency.  Musculoskeletal: Positive for joint pain and myalgias. Negative for back pain and falls.  Skin: Negative.   Negative for rash.  Neurological: Positive for weakness. Negative for headaches.  Endo/Heme/Allergies: Negative.  Does not bruise/bleed easily.  Psychiatric/Behavioral: Negative.  Negative for depression. The patient is not nervous/anxious and does not have insomnia.      As per HPI. Otherwise, a complete review of systems is negative.  PAST MEDICAL HISTORY: Past Medical History:  Diagnosis Date  . Cancer ([place])    colon  . Cancer ([place])    bone cancer  . Colon cancer ([place])    Hx of  . History of kidney stones   . Hypertension   . Prostate cancer ([place])   . Prostate cancer ([place])    Hx of   . S/P colon resection   . Shortness of breath dyspnea   . Suprapubic catheter ([place])   . Urinary retention     PAST SURGICAL HISTORY: Past Surgical History:  Procedure Laterality Date  . ABDOMINAL SURGERY    . COLON SURGERY    . CYSTOSCOPY WITH LITHOLAPAXY N/A 06/09/2017   Procedure: CYSTOSCOPY WITH LITHOLAPAXY;  Surgeon: Hollice Espy, MD;  Location: [facility] ORS;  Service: Urology;  Laterality: N/A;  . PROSTATE SURGERY  05/25/2016  . SUPRAPUBIC CATHETER INSERTION      FAMILY HISTORY: Family History  Problem Relation Age of Onset  . Hypertension Other   . Alcohol abuse Mother   . Heart attack Father   . Prostate cancer Brother   . Breast cancer Sister     ADVANCED DIRECTIVES (Y/N):  N  HEALTH MAINTENANCE: Social History   Tobacco Use  . Smoking status: Current Every Day Smoker    Packs/day: 0.25    Years: 40.00    Pack years:  10.00    Types: Cigarettes  . Smokeless tobacco: Never Used  . Tobacco comment: 1pack a week  Vaping Use  . Vaping Use: Never used  Substance Use Topics  . Alcohol use: Yes  . Drug use: No     Colonoscopy:  PAP:  Bone density:  Lipid panel:  No Known Allergies  Current Outpatient Medications  Medication Sig Dispense Refill  . abiraterone acetate (ZYTIGA) 250 MG tablet TAKE 4 TABLETS (1,000 MG TOTAL) BY MOUTH DAILY. TAKE ON AN EMPTY  STOMACH 1 HOUR BEFORE OR 2 HOURS AFTER A MEAL. (Patient taking differently: Take by mouth daily. Take on an empty stomach 1 hour before or 2 hours after a meal.) 120 tablet 3  . furosemide (LASIX) 20 MG tablet TAKE ONE TABLET BY MOUTH TWICE DAILY 60 tablet 2  . ibuprofen (ADVIL) 800 MG tablet Take 1 tablet (800 mg total) by mouth every 8 (eight) hours as needed for mild pain or moderate pain. 90 tablet 0  . mirabegron ER (MYRBETRIQ) 25 MG TB24 tablet Take 1 tablet (25 mg total) by mouth daily. 30 tablet 3  . oxybutynin (DITROPAN) 5 MG tablet Take 1 tablet (5 mg total) by mouth 3 (three) times daily. 90 tablet 0  . potassium chloride SA (KLOR-CON) 20 MEQ tablet Take 1 tablet (20 mEq total) by mouth 2 (two) times daily. 10 tablet 0  . predniSONE (DELTASONE) 5 MG tablet TAKE 1 TABLET BY MOUTH EVERYDAY WITH BREAKFAST 30 tablet 3   No current facility-administered medications for this visit.    OBJECTIVE: Vitals:   07/29/20 1146  BP: 95/79  Pulse: 89  Temp: 99.2 F (37.3 C)  SpO2: 97%     Body mass index is 30.42 kg/m.    ECOG FS:0 - Asymptomatic   Physical Exam Constitutional:      Appearance: Normal appearance.  HENT:     Head: Normocephalic and atraumatic.  Eyes:     Pupils: Pupils are equal, round, and reactive to light.  Cardiovascular:     Rate and Rhythm: Normal rate and regular rhythm.     Heart sounds: Normal heart sounds. No murmur heard.   Pulmonary:     Effort: Pulmonary effort is normal.     Breath sounds: Normal breath sounds. No wheezing.  Abdominal:     General: Bowel sounds are normal. There is no distension.     Palpations: Abdomen is soft.     Tenderness: There is no abdominal tenderness.  Musculoskeletal:        General: Normal range of motion.     Cervical back: Normal range of motion.  Skin:    General: Skin is warm and dry.     Findings: No rash.  Neurological:     Mental Status: He is alert and oriented to person, place, and time.  Psychiatric:         Judgment: Judgment normal.       LAB RESULTS:  Lab Results  Component Value Date   NA 133 (L) 07/29/2020   K 3.1 (L) 07/29/2020   CL 95 (L) 07/29/2020   CO2 26 07/29/2020   GLUCOSE 108 (H) 07/29/2020   BUN 7 (L) 07/29/2020   CREATININE 0.50 (L) 07/29/2020   CALCIUM 8.8 (L) 07/29/2020   PROT 7.1 07/29/2020   ALBUMIN 3.7 07/29/2020   AST 42 (H) 07/29/2020   ALT 27 07/29/2020   ALKPHOS 117 07/29/2020   BILITOT 1.1 07/29/2020   GFRNONAA >60 07/29/2020   GFRAA >  60 01/19/2020    Lab Results  Component Value Date   WBC 8.7 07/29/2020   NEUTROABS 6.8 07/29/2020   HGB 14.3 07/29/2020   HCT 39.6 07/29/2020   MCV 107.3 (H) 07/29/2020   PLT 114 (L) 07/29/2020     STUDIES: No results found.  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 -Unable to tolerate Lupron.  Last received in September 2018. -Refused Casodex prescription by urology. -Declines any chemotherapy but has agreed to [facility] plus prednisone. -Appears to be tolerating Zytiga and prednisone well. -Most recent PSA was 1.38 from 01/19/2020.  Repeat today.  2.  Suprapubic catheter: -Has home health assistance. -He is currently on oxybutynin for bladder spasms. -Not currently followed by urology.  3.  Bony lesions/Joint pain: -Patient declines treatment with Zometa or Xgeva. -Had bone scan completed on 02/02/2018 which showed bony disease in calvarium and ribs.  Numerous tiny foci of sclerosis throughout osseous structure that were new since his 2017 CT scan consistent with widespread osseous metastasis. -Given he is having worsening joint pain, would recommend repeat bone scan in the next 1 to 2 weeks. -PSA is pending. -Patient can take ibuprofen 800 mg twice daily.New RX sent.   4.  Hypokalemia: -Likely secondary to Zytiga and Lasix. -Labs from  07/29/2020 show a potassium level of 3.1. -Encouraged potassium rich foods. -Rx sent for potassium 20 mEq twice daily x5 days.  Disposition: -Labs today. -Continue Zytiga plus prednisone.  -Pick up new prescriptions for potassium and ibuprofen. -Bone scan in 1 to 2 weeks. -RTC in 3 months with repeat labs and MD assessment.  Patient expressed understanding and was in agreement with this plan. He also understands that He can call clinic at any time with any questions, concerns, or complaints.   Greater than 50% was spent in counseling and coordination of care with this patient including but not limited to discussion of the relevant topics above (See A&P) including, but not limited to diagnosis and management of acute and chronic medical conditions.   Cancer Staging Prostate cancer [facility]) Staging form: Prostate, AJCC 8th Edition - Clinical stage from 03/04/2018: Stage IVB (cT3a, cN0, cM1b, Grade Group: 5) - Signed by Lloyd Huger, MD on 03/04/2018 Histologic grading system: 5 grade system   Jacquelin Hawking, NP   07/29/2020 12:53 PM

## 2020-08-05 ENCOUNTER — Other Ambulatory Visit (HOSPITAL_COMMUNITY): Payer: Self-pay

## 2020-08-07 ENCOUNTER — Telehealth: Payer: Self-pay | Admitting: Internal Medicine

## 2020-08-07 ENCOUNTER — Telehealth: Payer: Self-pay

## 2020-08-07 DIAGNOSIS — R8290 Unspecified abnormal findings in urine: Secondary | ICD-10-CM | POA: Diagnosis not present

## 2020-08-07 NOTE — Telephone Encounter (Signed)
Pt states he is still sick and running a fever, needs to reschedule his scans for tomorrow.  Routing to team for follow up.  Patient can be reached at [phone].

## 2020-08-07 NOTE — Telephone Encounter (Signed)
Nicki from [facility] called back stating that patient is complaining of SPT cath not draining. Some pain and discomfort at site. He has tried irrigation with out success. He states that he has a fever and chills as well. Tempeture currently unknown by patient. Verbal order given for foley exchange and UA and culture collection, fax results to our office for review. Nicki gave verbal read back of orders.  Home Health will call our office back this afternoon to update [name] on patient's symptoms after their evaluation and current temp.

## 2020-08-07 NOTE — Telephone Encounter (Signed)
Call back from [facility] with [facility] with tylenol on board 99.5- low grade. Urine is clear and bright yellow no odor no sediment. SPT exchanged with out difficulty. Around 600-1078ml of urine return noted. Patient doing better pain wise after exchange per home health. Culture results will be faxed. Per Dr. Erlene Quan will wait on culture results prior to treatment.

## 2020-08-07 NOTE — Telephone Encounter (Signed)
Does pt need to be seen

## 2020-08-07 NOTE — Telephone Encounter (Signed)
VM left on triage line from [facility] [phone]) Patient has a possible UTI, she wants to know if we would like to give an order for a UA collection and possible cath exchange.  Attempted to return call to have more information about patient symptoms to determine if UA collection is needed. Left message for her to return call

## 2020-08-07 NOTE — Telephone Encounter (Signed)
Sorry its a Multimedia programmer pt

## 2020-08-12 ENCOUNTER — Other Ambulatory Visit (HOSPITAL_COMMUNITY): Payer: Self-pay

## 2020-08-12 MED FILL — Abiraterone Acetate Tab 250 MG: ORAL | 30 days supply | Qty: 120 | Fill #0 | Status: AC

## 2020-08-14 ENCOUNTER — Other Ambulatory Visit (HOSPITAL_COMMUNITY): Payer: Self-pay

## 2020-08-14 ENCOUNTER — Other Ambulatory Visit: Payer: Self-pay | Admitting: Urology

## 2020-08-14 MED FILL — Prednisone Tab 5 MG: ORAL | 30 days supply | Qty: 30 | Fill #0 | Status: AC

## 2020-08-14 NOTE — Telephone Encounter (Signed)
Spoke with Ardelle Park from [facility] and message given per Dr. Erlene Quan patient is chronically colonized and only treat if patient has a fever of 100.4 or greater, she verbalized understanding

## 2020-08-14 NOTE — Telephone Encounter (Signed)
Faxed recommendations to [facility] per Dr. Lawson Radar need to treat patient chronically colonized only treat- if fever greater than 100.1 or body aches, pain.

## 2020-08-14 NOTE — Telephone Encounter (Signed)
Left message for Gerald Odonnell at [facility] to call back to give culture results.

## 2020-08-16 ENCOUNTER — Other Ambulatory Visit (HOSPITAL_COMMUNITY): Payer: Self-pay

## 2020-08-27 ENCOUNTER — Other Ambulatory Visit: Payer: Self-pay | Admitting: Hospice and Palliative Medicine

## 2020-08-27 ENCOUNTER — Other Ambulatory Visit: Payer: Self-pay | Admitting: Oncology

## 2020-08-27 ENCOUNTER — Other Ambulatory Visit: Payer: Self-pay | Admitting: Internal Medicine

## 2020-08-27 DIAGNOSIS — Z9359 Other cystostomy status: Secondary | ICD-10-CM

## 2020-08-27 DIAGNOSIS — R600 Localized edema: Secondary | ICD-10-CM

## 2020-08-28 ENCOUNTER — Encounter
Admission: RE | Admit: 2020-08-28 | Discharge: 2020-08-28 | Disposition: A | Discharge status: Home / Self Care | Payer: Medicare HMO | Source: Ambulatory Visit | Attending: Oncology | Admitting: Oncology

## 2020-08-28 ENCOUNTER — Inpatient Hospital Stay: Payer: Medicare HMO | Attending: Oncology

## 2020-08-28 ENCOUNTER — Other Ambulatory Visit: Payer: Self-pay

## 2020-08-28 DIAGNOSIS — C61 Malignant neoplasm of prostate: Secondary | ICD-10-CM | POA: Diagnosis not present

## 2020-08-28 DIAGNOSIS — M7989 Other specified soft tissue disorders: Secondary | ICD-10-CM | POA: Diagnosis not present

## 2020-08-28 MED ORDER — TECHNETIUM TC 99M MEDRONATE IV KIT
22.3200 | PACK | Freq: Once | INTRAVENOUS | Status: AC | PRN
  Administered 2020-08-28: 22.32 via INTRAVENOUS

## 2020-08-30 ENCOUNTER — Telehealth: Payer: Self-pay | Admitting: *Deleted

## 2020-08-30 ENCOUNTER — Telehealth: Payer: Self-pay | Admitting: Oncology

## 2020-08-30 NOTE — Telephone Encounter (Signed)
Re: Bone Scan results.   Discussed with patient.   IMPRESSION: Intense radiotracer accumulation about the LEFT ankle, and midfoot of uncertain significance. This could be related to trauma or infection, as this would be an unusual site for isolated worsening of bony metastatic disease. This should be correlated with plain film evaluation and with any symptoms in this location.  Heterogeneity of the ribs in this patient with known widespread metastatic prostate cancer is similar to the prior study aside from a more focal intense area seen on the prior study in LEFT-sided sixth rib which is no longer visible.  Soft tissue uptake in the bilateral lower extremities likely related to reported edema.  Otherwise no new or progressive findings.  Faythe Casa, NP 08/30/2020 3:36 PM

## 2020-08-30 NOTE — Telephone Encounter (Signed)
I copied it from the [place], I will call Olivia Mackie and let her know that it has not crossed over yet

## 2020-08-30 NOTE — Telephone Encounter (Signed)
I don't see these results in the computer. Is shows still pending on my end  Faythe Casa, NP 08/30/2020 11:53 AM

## 2020-08-30 NOTE — Telephone Encounter (Signed)
Joette Catching, NP has called and spoken with patient. See Her note

## 2020-08-30 NOTE — Telephone Encounter (Signed)
Patient has called a couple of times asking for his bone scan results and is anxious for this information. Please return his call.   IMPRESSION: Intense radiotracer accumulation about the LEFT ankle, and midfoot of uncertain significance. This could be related to trauma or infection, as this would be an unusual site for isolated worsening of bony metastatic disease. This should be correlated with plain film evaluation and with any symptoms in this location.  Heterogeneity of the ribs in this patient with known widespread metastatic prostate cancer is similar to the prior study aside from a more focal intense area seen on the prior study in LEFT-sided sixth rib which is no longer visible.  Soft tissue uptake in the bilateral lower extremities likely related to reported edema.  Otherwise no new or progressive findings.   Electronically Signed By: Zetta Bills M.D. On: 08/30/2020 11:05

## 2020-09-02 ENCOUNTER — Other Ambulatory Visit (HOSPITAL_COMMUNITY): Payer: Self-pay

## 2020-09-02 MED FILL — Abiraterone Acetate Tab 250 MG: ORAL | 30 days supply | Qty: 120 | Fill #1 | Status: AC

## 2020-09-02 MED FILL — Prednisone Tab 5 MG: ORAL | 30 days supply | Qty: 30 | Fill #1 | Status: AC

## 2020-09-05 ENCOUNTER — Other Ambulatory Visit (HOSPITAL_COMMUNITY): Payer: Self-pay

## 2020-09-05 ENCOUNTER — Telehealth: Payer: Self-pay | Admitting: Family Medicine

## 2020-09-05 NOTE — Telephone Encounter (Signed)
Raquel James from [facility] called stating they are having to change patient's SPT tube every 2 1/2-3 weeks due to sediment build up in the catheter. She is requesting an order to come out every 3 weeks for SPT change. Her number is [phone] Please advise

## 2020-09-06 ENCOUNTER — Other Ambulatory Visit (HOSPITAL_COMMUNITY): Payer: Self-pay

## 2020-09-06 NOTE — Telephone Encounter (Signed)
Called home health nurse, gave the verbal orders below. Nurse gave verbal understanding.

## 2020-09-06 NOTE — Telephone Encounter (Signed)
OK to send in orders for SPT changes every 3 weeks or sooner with catheter occlusion. Recommend switching to an 17PZ silicone catheter to reduce his risk of occlusion.

## 2020-09-11 ENCOUNTER — Ambulatory Visit (INDEPENDENT_AMBULATORY_CARE_PROVIDER_SITE_OTHER): Payer: Medicare HMO | Admitting: Nurse Practitioner

## 2020-09-11 ENCOUNTER — Encounter: Payer: Self-pay | Admitting: Nurse Practitioner

## 2020-09-11 ENCOUNTER — Other Ambulatory Visit: Payer: Self-pay

## 2020-09-11 VITALS — BP 144/76 | HR 100 | Temp 98.2°F | Resp 16 | Ht 70.0 in | Wt 217.0 lb

## 2020-09-11 DIAGNOSIS — I10 Essential (primary) hypertension: Secondary | ICD-10-CM

## 2020-09-11 DIAGNOSIS — I700 Atherosclerosis of aorta: Secondary | ICD-10-CM | POA: Diagnosis not present

## 2020-09-11 DIAGNOSIS — C61 Malignant neoplasm of prostate: Secondary | ICD-10-CM

## 2020-09-11 DIAGNOSIS — Z9359 Other cystostomy status: Secondary | ICD-10-CM

## 2020-09-11 DIAGNOSIS — R600 Localized edema: Secondary | ICD-10-CM | POA: Diagnosis not present

## 2020-09-11 MED ORDER — OXYBUTYNIN CHLORIDE 5 MG PO TABS: 5.0000 mg | ORAL_TABLET | Freq: Four times a day (QID) | ORAL | 6 refills | Status: DC

## 2020-09-11 MED ORDER — FUROSEMIDE 20 MG PO TABS: 20.0000 mg | ORAL_TABLET | Freq: Two times a day (BID) | ORAL | 2 refills | Status: DC

## 2020-09-11 NOTE — Progress Notes (Signed)
[facility] [place], [place] [address]  Internal MEDICINE  Office Visit Note  Patient Name: Gerald Odonnell  [id number]  [id number]  Date of Service: 09/14/2020  Chief Complaint  Patient presents with  . Follow-up    Breathing is short and shallow, feet swollen, refill request  . Hypertension  . Quality Metric Gaps    colonoscopy    Gerald Odonnell is a 65 yo man presenting for a follow up visit for hypertension and medication refills. He has a history of colon, prostate and bone cancer. He is currently taking zytiga metastatic prostate cancer. He has a suprapubic catheter. He has not had a recent colonoscopy but states that he does not want to have any more colonoscopies. He reports drinking approx. 20 beers a day and declines any counseling for alcohol abuse/dependence. He lives at home alone with his dog. He has been married twice. He reports his first wife died a month after starting chemo and radiation treatments at age 55. He has been with his current wife for 13 years and is still legally married to her but she left him when he was diagnosed with bone cancer.  -Swelling is ongoing, comes and goes, takes lasix 20mg  BID. He elevates his feet as much as possible. He is interested in trying compression stockings and asked about a compression wrap? For lower extremities that his home health nurse/aid told him about.  -He is intermittently short of breath and states that it comes and goes. He denies any wheezing, dyspnea or acute distress.  -blood pressure is elevated today 144/76. He is taking lasix but is not on any other antihypertensive medication. Will recheck BP in 3 months at follow up Need refill of oxybutynin and lasix Asking about a Compression stocking that wraps around the leg. Will look into this to see if it can be ordered for his bilateral lower extremity edema.    Hypertension The problem is controlled. Associated symptoms include shortness of breath  (intermittent). Pertinent negatives include no chest pain or palpitations. Risk factors for coronary artery disease include obesity and stress. There are no compliance problems.        Current Medication: Outpatient Encounter Medications as of 09/11/2020  Medication Sig  . [DISCONTINUED] oxybutynin (DITROPAN) 5 MG tablet TAKE 1 TABLET BY  MOUTH 3 TIMES DAILY  . abiraterone acetate (ZYTIGA) 250 MG tablet TAKE 4 TABLETS (1,000 MG TOTAL) BY MOUTH DAILY. TAKE ON AN EMPTY STOMACH 1 HOUR BEFORE OR 2 HOURS AFTER A MEAL.  . furosemide (LASIX) 20 MG tablet Take 1 tablet (20 mg total) by mouth 2 (two) times daily.  Marland Kitchen ibuprofen (ADVIL) 800 MG tablet Take 1 tablet (800 mg total) by mouth every 8 (eight) hours as needed for mild pain or moderate pain.  . mirabegron ER (MYRBETRIQ) 25 MG TB24 tablet Take 1 tablet (25 mg total) by mouth daily.  Marland Kitchen oxybutynin (DITROPAN) 5 MG tablet Take 1 tablet (5 mg total) by mouth 4 (four) times daily.  . potassium chloride SA (KLOR-CON) 20 MEQ tablet Take 1 tablet (20 mEq total) by mouth 2 (two) times daily.  . predniSONE (DELTASONE) 5 MG tablet TAKE 1 TABLET BY MOUTH EVERYDAY WITH BREAKFAST  . [DISCONTINUED] furosemide (LASIX) 20 MG tablet TAKE ONE TABLET BY MOUTH TWICE DAILY   No facility-administered encounter medications on file as of 09/11/2020.    Surgical History: Past Surgical History:  Procedure Laterality Date  . ABDOMINAL SURGERY    . COLON SURGERY    .  CYSTOSCOPY WITH LITHOLAPAXY N/A 06/09/2017   Procedure: CYSTOSCOPY WITH LITHOLAPAXY;  Surgeon: Hollice Espy, MD;  Location: [facility] ORS;  Service: Urology;  Laterality: N/A;  . PROSTATE SURGERY  05/25/2016  . SUPRAPUBIC CATHETER INSERTION      Medical History: Past Medical History:  Diagnosis Date  . Cancer ([place])    colon  . Cancer ([place])    bone cancer  . Colon cancer ([place])    Hx of  . History of kidney stones   . Hypertension   . Prostate cancer ([place])   . Prostate cancer ([place])    Hx of   .  S/P colon resection   . Shortness of breath dyspnea   . Suprapubic catheter ([place])   . Urinary retention     Family History: Family History  Problem Relation Age of Onset  . Hypertension Other   . Alcohol abuse Mother   . Heart attack Father   . Prostate cancer Brother   . Breast cancer Sister     Social History   Socioeconomic History  . Marital status: Widowed    Spouse name: Not on file  . Number of children: Not on file  . Years of education: Not on file  . Highest education level: Not on file  Occupational History  . Not on file  Tobacco Use  . Smoking status: Current Every Day Smoker    Packs/day: 0.25    Years: 40.00    Pack years: 10.00    Types: Cigarettes  . Smokeless tobacco: Never Used  . Tobacco comment: 1pack a week  Vaping Use  . Vaping Use: Never used  Substance and Sexual Activity  . Alcohol use: Yes  . Drug use: No  . Sexual activity: Not on file  Other Topics Concern  . Not on file  Social History Narrative   ** Merged History Encounter **       Social Determinants of Health   Financial Resource Strain: Not on file  Food Insecurity: Not on file  Transportation Needs: Not on file  Physical Activity: Not on file  Stress: Not on file  Social Connections: Not on file  Intimate Partner Violence: Not on file      Review of Systems  Constitutional: Positive for appetite change and fatigue. Negative for chills and unexpected weight change.  HENT: Negative for congestion, rhinorrhea, sneezing and sore throat.   Eyes: Negative for redness.  Respiratory: Positive for shortness of breath (intermittent). Negative for cough and chest tightness.   Cardiovascular: Positive for leg swelling (chronic problem). Negative for chest pain and palpitations.  Gastrointestinal: Positive for nausea (intermittent). Negative for abdominal pain, constipation, diarrhea and vomiting.  Genitourinary: Positive for frequency (taking oxybutynin).       Suprapubic  catheter  Musculoskeletal: Negative for arthralgias.       Bone pain- metastasis to bone (primary cancer is prostate)  Skin: Negative for rash.  Neurological: Negative.  Negative for tremors and numbness.  Hematological: Negative for adenopathy. Does not bruise/bleed easily.  Psychiatric/Behavioral: Negative for behavioral problems (Depression), sleep disturbance and suicidal ideas. The patient is not nervous/anxious.     Vital Signs: BP (!) 144/76   Pulse 100   Temp 98.2 F (36.8 C)   Resp 16   Ht 5\' 10"  (1.778 m)   Wt 217 lb (98.4 kg)   SpO2 97%   BMI 31.14 kg/m    Physical Exam Vitals reviewed.  Constitutional:      General: He is not in  acute distress.    Appearance: He is obese. He is not ill-appearing.  HENT:     Head: Normocephalic and atraumatic.  Eyes:     Pupils: Pupils are equal, round, and reactive to light.  Cardiovascular:     Rate and Rhythm: Normal rate and regular rhythm.     Pulses: Normal pulses.     Heart sounds: Normal heart sounds.  Pulmonary:     Effort: Pulmonary effort is normal.     Breath sounds: Normal breath sounds.  Abdominal:     General: Bowel sounds are normal.     Palpations: Abdomen is soft.  Genitourinary:    Comments: Has a suprapubic catheter. Site is dressed with clean gauze and paper tape, no redness, swelling or purulent drainage noted. Urine is clear yellow. Musculoskeletal:     Right lower leg: 2+ Pitting Edema present.     Left lower leg: 2+ Pitting Edema present.  Skin:    General: Skin is warm and dry.     Capillary Refill: Capillary refill takes less than 2 seconds.  Neurological:     Mental Status: He is alert and oriented to person, place, and time.  Psychiatric:        Mood and Affect: Mood normal.        Behavior: Behavior normal.   Assessment/Plan:  1. Benign hypertension BP 144/76. Taking lasix, no other antihypertensive medication. Will recheck blood pressure at 3 month follow up.   2. Bilateral lower  extremity edema Sulo is taking lasix to decrease the lower extremity edema. He is interested in trying compression stockings but cannot put them on by himself. Asking about a compression wrap for lower extremities, will look into this to see if it can be ordered. - furosemide (LASIX) 20 MG tablet; Take 1 tablet (20 mg total) by mouth 2 (two) times daily.  Dispense: 60 tablet; Refill: 2  3. Suprapubic catheter ([place]) Stable, clean without skin breakdown - oxybutynin (DITROPAN) 5 MG tablet; Take 1 tablet (5 mg total) by mouth 4 (four) times daily.  Dispense: 120 tablet; Refill: 6  4. 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 Stable, followed by oncology, currently on Zytiga for treatment   General Counseling: bren steers understanding of the findings of todays visit and agrees with plan of treatment. I have discussed any further diagnostic evaluation that may be needed or ordered today. We also reviewed his medications today. he has been encouraged to call the office with any questions or concerns that should arise related to todays visit.    No orders of the defined types were placed in this encounter.   Meds ordered this encounter  Medications  . furosemide (LASIX) 20 MG tablet    Sig: Take 1 tablet (20 mg total) by mouth 2 (two) times daily.    Dispense:  60 tablet    Refill:  2  . oxybutynin (DITROPAN) 5 MG tablet    Sig: Take 1 tablet (5 mg total) by mouth 4 (four) times daily.    Dispense:  120 tablet    Refill:  6   Return in about 3 months (around 12/12/2020) for F/U, med refill, Briea Mcenery PCP.  Total time spent:30 Minutes Time spent includes review of chart, medications, test results, and follow up plan with the patient.   [place] Controlled Substance Database was reviewed by me.  This patient was seen by Jonetta Osgood, FNP-C in collaboration with Dr. Clayborn Bigness as a part of collaborative  care agreement.   Dr Lavera Guise Internal  medicine

## 2020-09-12 DIAGNOSIS — I700 Atherosclerosis of aorta: Secondary | ICD-10-CM | POA: Insufficient documentation

## 2020-09-30 ENCOUNTER — Other Ambulatory Visit (HOSPITAL_COMMUNITY): Payer: Self-pay

## 2020-09-30 MED FILL — Prednisone Tab 5 MG: ORAL | 30 days supply | Qty: 30 | Fill #2 | Status: AC

## 2020-09-30 MED FILL — Abiraterone Acetate Tab 250 MG: ORAL | 30 days supply | Qty: 120 | Fill #2 | Status: AC

## 2020-10-01 ENCOUNTER — Telehealth: Payer: Self-pay

## 2020-10-01 NOTE — Telephone Encounter (Signed)
Nikki from [facility] [phone]) called and requested PT.  Pt fell last night and said it was from him being weak and he got off balance.  Pt told home health that he didn't want to go to the hospital.  Pt is a little sore today from the fall and feels that the PT may help some.  I gave the ok for the PT but advised to [facility] that if they evaluate pt and he looks like he needs to be checked that she will send him to hospital.

## 2020-10-02 ENCOUNTER — Telehealth: Payer: Self-pay

## 2020-10-02 NOTE — Telephone Encounter (Signed)
Lauren from [facility] [phone]) left a vmail stating that patient is having dysuria and dark urine and she was requesting an order for UA and CX to check for UTI. Per Dr. Erlene Quan called back and spoke with Ander Purpura and explained that with patient having a chronic foley will only treat/ collect sample if patient has symptoms of fever, chills, nausea or vomiting. Possible patient is experiencing bladder spasms and dark urine would suggest he should increase water intake. Lauren verbalized understanding and states she will make a note of this in his chart.  Message from [place] with [facility] [phone]) requesting orders for physical therapy for patient as well as an order for UA collection possible UTI. Called back and left a detailed message explaining that with patient having a chronic foley will only treat/ collect sample if patient has symptoms of fever, chills, nausea or vomiting. Also our office will only manage urology care for patient a PT order will need to come from primary care.

## 2020-10-02 NOTE — Telephone Encounter (Signed)
[name] PT called asking for verbal order PT 2 x w x 1 w, and 1 x w x 1 w. Gerald Odonnell also mentioned that pt may have a UTI, he is having sharp pains shooting down his penis and his urine is a lot darker than usual and feels a lot of pressure and has to pee a lot.  Pt does have a catheter in place.  I gave Gerald Odonnell verbal order to have nurse do a urine dip and culture and we can see what it shows.   Call back #: [phone]

## 2020-10-03 ENCOUNTER — Other Ambulatory Visit: Payer: Self-pay

## 2020-10-03 ENCOUNTER — Emergency Department: Payer: Medicare HMO

## 2020-10-03 ENCOUNTER — Encounter: Payer: Self-pay | Admitting: Radiology

## 2020-10-03 ENCOUNTER — Inpatient Hospital Stay
Admission: EM | Admit: 2020-10-03 | Discharge: 2020-10-14 | DRG: 698 | Disposition: A | Discharge status: Skilled Nursing Facility | Payer: Medicare HMO | Attending: Internal Medicine | Admitting: Internal Medicine

## 2020-10-03 DIAGNOSIS — W19XXXA Unspecified fall, initial encounter: Secondary | ICD-10-CM | POA: Diagnosis present

## 2020-10-03 DIAGNOSIS — Y738 Miscellaneous gastroenterology and urology devices associated with adverse incidents, not elsewhere classified: Secondary | ICD-10-CM | POA: Diagnosis present

## 2020-10-03 DIAGNOSIS — R310 Gross hematuria: Secondary | ICD-10-CM | POA: Diagnosis present

## 2020-10-03 DIAGNOSIS — R1110 Vomiting, unspecified: Secondary | ICD-10-CM | POA: Diagnosis not present

## 2020-10-03 DIAGNOSIS — T83510A Infection and inflammatory reaction due to cystostomy catheter, initial encounter: Secondary | ICD-10-CM | POA: Diagnosis not present

## 2020-10-03 DIAGNOSIS — I4891 Unspecified atrial fibrillation: Secondary | ICD-10-CM | POA: Diagnosis present

## 2020-10-03 DIAGNOSIS — Z803 Family history of malignant neoplasm of breast: Secondary | ICD-10-CM | POA: Diagnosis not present

## 2020-10-03 DIAGNOSIS — R000 Tachycardia, unspecified: Secondary | ICD-10-CM | POA: Diagnosis not present

## 2020-10-03 DIAGNOSIS — R079 Chest pain, unspecified: Secondary | ICD-10-CM | POA: Diagnosis not present

## 2020-10-03 DIAGNOSIS — R6520 Severe sepsis without septic shock: Secondary | ICD-10-CM | POA: Diagnosis present

## 2020-10-03 DIAGNOSIS — R0689 Other abnormalities of breathing: Secondary | ICD-10-CM | POA: Diagnosis not present

## 2020-10-03 DIAGNOSIS — I2510 Atherosclerotic heart disease of native coronary artery without angina pectoris: Secondary | ICD-10-CM | POA: Diagnosis present

## 2020-10-03 DIAGNOSIS — C7951 Secondary malignant neoplasm of bone: Secondary | ICD-10-CM | POA: Diagnosis not present

## 2020-10-03 DIAGNOSIS — G9341 Metabolic encephalopathy: Secondary | ICD-10-CM | POA: Diagnosis not present

## 2020-10-03 DIAGNOSIS — L8990 Pressure ulcer of unspecified site, unspecified stage: Secondary | ICD-10-CM | POA: Insufficient documentation

## 2020-10-03 DIAGNOSIS — I639 Cerebral infarction, unspecified: Secondary | ICD-10-CM | POA: Diagnosis not present

## 2020-10-03 DIAGNOSIS — R109 Unspecified abdominal pain: Secondary | ICD-10-CM | POA: Diagnosis not present

## 2020-10-03 DIAGNOSIS — K7460 Unspecified cirrhosis of liver: Secondary | ICD-10-CM | POA: Diagnosis present

## 2020-10-03 DIAGNOSIS — R509 Fever, unspecified: Secondary | ICD-10-CM | POA: Diagnosis not present

## 2020-10-03 DIAGNOSIS — Z8546 Personal history of malignant neoplasm of prostate: Secondary | ICD-10-CM

## 2020-10-03 DIAGNOSIS — I10 Essential (primary) hypertension: Secondary | ICD-10-CM | POA: Diagnosis present

## 2020-10-03 DIAGNOSIS — Z85038 Personal history of other malignant neoplasm of large intestine: Secondary | ICD-10-CM

## 2020-10-03 DIAGNOSIS — J9811 Atelectasis: Secondary | ICD-10-CM | POA: Diagnosis not present

## 2020-10-03 DIAGNOSIS — K5900 Constipation, unspecified: Secondary | ICD-10-CM | POA: Diagnosis not present

## 2020-10-03 DIAGNOSIS — Z811 Family history of alcohol abuse and dependence: Secondary | ICD-10-CM | POA: Diagnosis not present

## 2020-10-03 DIAGNOSIS — R404 Transient alteration of awareness: Secondary | ICD-10-CM | POA: Diagnosis not present

## 2020-10-03 DIAGNOSIS — Z20822 Contact with and (suspected) exposure to covid-19: Secondary | ICD-10-CM | POA: Diagnosis not present

## 2020-10-03 DIAGNOSIS — N390 Urinary tract infection, site not specified: Secondary | ICD-10-CM | POA: Diagnosis not present

## 2020-10-03 DIAGNOSIS — R600 Localized edema: Secondary | ICD-10-CM

## 2020-10-03 DIAGNOSIS — I700 Atherosclerosis of aorta: Secondary | ICD-10-CM | POA: Diagnosis present

## 2020-10-03 DIAGNOSIS — T502X5A Adverse effect of carbonic-anhydrase inhibitors, benzothiadiazides and other diuretics, initial encounter: Secondary | ICD-10-CM | POA: Diagnosis present

## 2020-10-03 DIAGNOSIS — I959 Hypotension, unspecified: Secondary | ICD-10-CM | POA: Diagnosis not present

## 2020-10-03 DIAGNOSIS — N138 Other obstructive and reflux uropathy: Secondary | ICD-10-CM | POA: Diagnosis not present

## 2020-10-03 DIAGNOSIS — C61 Malignant neoplasm of prostate: Secondary | ICD-10-CM | POA: Diagnosis present

## 2020-10-03 DIAGNOSIS — E876 Hypokalemia: Secondary | ICD-10-CM | POA: Diagnosis present

## 2020-10-03 DIAGNOSIS — N401 Enlarged prostate with lower urinary tract symptoms: Secondary | ICD-10-CM | POA: Diagnosis not present

## 2020-10-03 DIAGNOSIS — G9340 Encephalopathy, unspecified: Secondary | ICD-10-CM | POA: Diagnosis not present

## 2020-10-03 DIAGNOSIS — D696 Thrombocytopenia, unspecified: Secondary | ICD-10-CM | POA: Diagnosis not present

## 2020-10-03 DIAGNOSIS — E8770 Fluid overload, unspecified: Secondary | ICD-10-CM | POA: Diagnosis not present

## 2020-10-03 DIAGNOSIS — A419 Sepsis, unspecified organism: Secondary | ICD-10-CM | POA: Diagnosis present

## 2020-10-03 DIAGNOSIS — L89312 Pressure ulcer of right buttock, stage 2: Secondary | ICD-10-CM | POA: Diagnosis present

## 2020-10-03 DIAGNOSIS — Z79899 Other long term (current) drug therapy: Secondary | ICD-10-CM | POA: Diagnosis not present

## 2020-10-03 DIAGNOSIS — R279 Unspecified lack of coordination: Secondary | ICD-10-CM | POA: Diagnosis not present

## 2020-10-03 DIAGNOSIS — Z8042 Family history of malignant neoplasm of prostate: Secondary | ICD-10-CM | POA: Diagnosis not present

## 2020-10-03 DIAGNOSIS — F17210 Nicotine dependence, cigarettes, uncomplicated: Secondary | ICD-10-CM | POA: Diagnosis present

## 2020-10-03 DIAGNOSIS — R0602 Shortness of breath: Secondary | ICD-10-CM | POA: Diagnosis not present

## 2020-10-03 DIAGNOSIS — Z8249 Family history of ischemic heart disease and other diseases of the circulatory system: Secondary | ICD-10-CM | POA: Diagnosis not present

## 2020-10-03 DIAGNOSIS — A4181 Sepsis due to Enterococcus: Secondary | ICD-10-CM | POA: Diagnosis not present

## 2020-10-03 DIAGNOSIS — R410 Disorientation, unspecified: Secondary | ICD-10-CM | POA: Diagnosis not present

## 2020-10-03 DIAGNOSIS — R0902 Hypoxemia: Secondary | ICD-10-CM | POA: Diagnosis not present

## 2020-10-03 DIAGNOSIS — R531 Weakness: Secondary | ICD-10-CM

## 2020-10-03 DIAGNOSIS — Z743 Need for continuous supervision: Secondary | ICD-10-CM | POA: Diagnosis not present

## 2020-10-03 DIAGNOSIS — J90 Pleural effusion, not elsewhere classified: Secondary | ICD-10-CM | POA: Diagnosis not present

## 2020-10-03 DIAGNOSIS — S022XXA Fracture of nasal bones, initial encounter for closed fracture: Secondary | ICD-10-CM | POA: Diagnosis not present

## 2020-10-03 DIAGNOSIS — G9389 Other specified disorders of brain: Secondary | ICD-10-CM | POA: Diagnosis not present

## 2020-10-03 LAB — COMPREHENSIVE METABOLIC PANEL
ALT: 25 U/L (ref 0–44)
AST: 53 U/L — ABNORMAL HIGH (ref 15–41)
Albumin: 3 g/dL — ABNORMAL LOW (ref 3.5–5.0)
Alkaline Phosphatase: 136 U/L — ABNORMAL HIGH (ref 38–126)
Anion gap: 9 (ref 5–15)
BUN: 10 mg/dL (ref 8–23)
CO2: 23 mmol/L (ref 22–32)
Calcium: 8.9 mg/dL (ref 8.9–10.3)
Chloride: 100 mmol/L (ref 98–111)
Creatinine, Ser: 0.63 mg/dL (ref 0.61–1.24)
GFR, Estimated: >60 mL/min (ref >60)
Glucose, Bld: 118 mg/dL — ABNORMAL HIGH (ref 70–99)
Potassium: 3.3 mmol/L — ABNORMAL LOW (ref 3.5–5.1)
Sodium: 132 mmol/L — ABNORMAL LOW (ref 135–145)
Total Bilirubin: 3.8 mg/dL — ABNORMAL HIGH (ref 0.3–1.2)
Total Protein: 6.6 g/dL (ref 6.5–8.1)

## 2020-10-03 LAB — RESP PANEL BY RT-PCR (FLU A&B, COVID) ARPGX2
Influenza A by PCR: NEGATIVE
Influenza B by PCR: NEGATIVE
SARS Coronavirus 2 by RT PCR: NEGATIVE

## 2020-10-03 LAB — LACTIC ACID, PLASMA
Lactic Acid, Venous: 1.2 mmol/L (ref 0.5–1.9)
Lactic Acid, Venous: 2.3 mmol/L (ref 0.5–1.9)

## 2020-10-03 LAB — TROPONIN I (HIGH SENSITIVITY)
Troponin I (High Sensitivity): 17 ng/L (ref <18)
Troponin I (High Sensitivity): 20 ng/L — ABNORMAL HIGH (ref <18)

## 2020-10-03 LAB — BRAIN NATRIURETIC PEPTIDE: B Natriuretic Peptide: 147.1 pg/mL — ABNORMAL HIGH (ref 0.0–100.0)

## 2020-10-03 LAB — CBC WITH DIFFERENTIAL/PLATELET
Abs Immature Granulocytes: 0.08 10*3/uL — ABNORMAL HIGH (ref 0.00–0.07)
Basophils Absolute: 0 10*3/uL (ref 0.0–0.1)
Basophils Relative: 0 %
Eosinophils Absolute: 0 10*3/uL (ref 0.0–0.5)
Eosinophils Relative: 0 %
HCT: 36 % — ABNORMAL LOW (ref 39.0–52.0)
Hemoglobin: 13.1 g/dL (ref 13.0–17.0)
Immature Granulocytes: 1 %
Lymphocytes Relative: 4 %
Lymphs Abs: 0.4 10*3/uL — ABNORMAL LOW (ref 0.7–4.0)
MCH: 38.9 pg — ABNORMAL HIGH (ref 26.0–34.0)
MCHC: 36.4 g/dL — ABNORMAL HIGH (ref 30.0–36.0)
MCV: 106.8 fL — ABNORMAL HIGH (ref 80.0–100.0)
Monocytes Absolute: 0.9 10*3/uL (ref 0.1–1.0)
Monocytes Relative: 8 %
Neutro Abs: 10.1 10*3/uL — ABNORMAL HIGH (ref 1.7–7.7)
Neutrophils Relative %: 87 %
Platelets: 117 10*3/uL — ABNORMAL LOW (ref 150–400)
RBC: 3.37 MIL/uL — ABNORMAL LOW (ref 4.22–5.81)
RDW: 12.7 % (ref 11.5–15.5)
WBC: 11.5 10*3/uL — ABNORMAL HIGH (ref 4.0–10.5)
nRBC: 0 % (ref 0.0–0.2)

## 2020-10-03 LAB — URINALYSIS, COMPLETE (UACMP) WITH MICROSCOPIC
Bilirubin Urine: NEGATIVE
Glucose, UA: NEGATIVE mg/dL
Ketones, ur: 5 mg/dL — AB
Nitrite: POSITIVE — AB
Protein, ur: 30 mg/dL — AB
Specific Gravity, Urine: 1.015 (ref 1.005–1.030)
Squamous Epithelial / HPF: NONE SEEN (ref 0–5)
WBC, UA: >50 WBC/hpf — ABNORMAL HIGH (ref 0–5)
pH: 7 (ref 5.0–8.0)

## 2020-10-03 LAB — CK: Total CK: 201 U/L (ref 49–397)

## 2020-10-03 LAB — HM HIV SCREENING LAB: HM HIV Screening: NEGATIVE

## 2020-10-03 LAB — LIPASE, BLOOD: Lipase: 18 U/L (ref 11–51)

## 2020-10-03 LAB — MAGNESIUM: Magnesium: 1.6 mg/dL — ABNORMAL LOW (ref 1.7–2.4)

## 2020-10-03 LAB — HIV ANTIBODY (ROUTINE TESTING W REFLEX): HIV Screen 4th Generation wRfx: NONREACTIVE

## 2020-10-03 MED ORDER — ACETAMINOPHEN 650 MG RE SUPP: 650.0000 mg | Freq: Four times a day (QID) | RECTAL | Status: DC | PRN

## 2020-10-03 MED ORDER — POTASSIUM CHLORIDE IN NACL 40-0.9 MEQ/L-% IV SOLN
INTRAVENOUS | Status: DC
  Filled 2020-10-03 (×8): qty 1000

## 2020-10-03 MED ORDER — DILTIAZEM HCL-DEXTROSE 125-5 MG/125ML-% IV SOLN (PREMIX)
5.0000 mg/h | INTRAVENOUS | Status: DC
  Administered 2020-10-03: 5 mg/h via INTRAVENOUS
  Filled 2020-10-03: qty 125

## 2020-10-03 MED ORDER — MIRABEGRON ER 25 MG PO TB24: 25.0000 mg | ORAL_TABLET | Freq: Every day | ORAL | Status: DC

## 2020-10-03 MED ORDER — LACTATED RINGERS IV SOLN: Freq: Once | INTRAVENOUS | Status: AC

## 2020-10-03 MED ORDER — OXYBUTYNIN CHLORIDE 5 MG PO TABS
5.0000 mg | ORAL_TABLET | Freq: Four times a day (QID) | ORAL | Status: DC
  Administered 2020-10-03 – 2020-10-04 (×4): 5 mg via ORAL
  Filled 2020-10-03 (×7): qty 1

## 2020-10-03 MED ORDER — ABIRATERONE ACETATE 250 MG PO TABS
1000.0000 mg | ORAL_TABLET | Freq: Every day | ORAL | Status: DC
  Administered 2020-10-05 – 2020-10-14 (×6): 1000 mg via ORAL
  Filled 2020-10-03 (×7): qty 4

## 2020-10-03 MED ORDER — SODIUM CHLORIDE 0.9 % IV SOLN
1.0000 g | INTRAVENOUS | Status: DC
  Administered 2020-10-04 – 2020-10-06 (×3): 1 g via INTRAVENOUS
  Filled 2020-10-03: qty 10
  Filled 2020-10-03 (×3): qty 1

## 2020-10-03 MED ORDER — DILTIAZEM HCL 25 MG/5ML IV SOLN
10.0000 mg | Freq: Once | INTRAVENOUS | Status: AC
  Administered 2020-10-03: 10 mg via INTRAVENOUS
  Filled 2020-10-03: qty 5

## 2020-10-03 MED ORDER — ONDANSETRON HCL 4 MG/2ML IJ SOLN
4.0000 mg | Freq: Four times a day (QID) | INTRAMUSCULAR | Status: DC | PRN
  Administered 2020-10-06 – 2020-10-12 (×3): 4 mg via INTRAVENOUS
  Filled 2020-10-03 (×4): qty 2

## 2020-10-03 MED ORDER — MAGNESIUM SULFATE 2 GM/50ML IV SOLN
INTRAVENOUS | Status: AC
  Administered 2020-10-04: 2 g
  Filled 2020-10-03: qty 50

## 2020-10-03 MED ORDER — POTASSIUM CHLORIDE 20 MEQ PO PACK
40.0000 meq | PACK | Freq: Once | ORAL | Status: AC
  Administered 2020-10-04: 40 meq via ORAL
  Filled 2020-10-03: qty 2

## 2020-10-03 MED ORDER — ENOXAPARIN SODIUM 60 MG/0.6ML IJ SOSY
60.0000 mg | PREFILLED_SYRINGE | INTRAMUSCULAR | Status: DC
  Administered 2020-10-03 – 2020-10-06 (×4): 60 mg via SUBCUTANEOUS
  Filled 2020-10-03 (×5): qty 0.6

## 2020-10-03 MED ORDER — SODIUM CHLORIDE 0.9 % IV BOLUS
1000.0000 mL | Freq: Once | INTRAVENOUS | Status: AC
  Administered 2020-10-03: 1000 mL via INTRAVENOUS

## 2020-10-03 MED ORDER — PREDNISONE 10 MG PO TABS
5.0000 mg | ORAL_TABLET | Freq: Every day | ORAL | Status: DC
  Administered 2020-10-04 – 2020-10-14 (×11): 5 mg via ORAL
  Filled 2020-10-03 (×11): qty 1

## 2020-10-03 MED ORDER — AMIODARONE HCL IN DEXTROSE 360-4.14 MG/200ML-% IV SOLN
30.0000 mg/h | INTRAVENOUS | Status: DC
  Administered 2020-10-04 – 2020-10-07 (×6): 30 mg/h via INTRAVENOUS
  Filled 2020-10-03 (×7): qty 200

## 2020-10-03 MED ORDER — ONDANSETRON HCL 4 MG PO TABS: 4.0000 mg | ORAL_TABLET | Freq: Four times a day (QID) | ORAL | Status: DC | PRN

## 2020-10-03 MED ORDER — SODIUM CHLORIDE 0.9 % IV SOLN
1.0000 g | Freq: Once | INTRAVENOUS | Status: AC
  Administered 2020-10-03: 1 g via INTRAVENOUS
  Filled 2020-10-03: qty 10

## 2020-10-03 MED ORDER — IOHEXOL 350 MG/ML SOLN
75.0000 mL | Freq: Once | INTRAVENOUS | Status: AC | PRN
  Administered 2020-10-03: 75 mL via INTRAVENOUS

## 2020-10-03 MED ORDER — AMIODARONE IV BOLUS ONLY 150 MG/100ML
150.0000 mg | Freq: Once | INTRAVENOUS | Status: AC
  Administered 2020-10-03: 150 mg via INTRAVENOUS
  Filled 2020-10-03: qty 100

## 2020-10-03 MED ORDER — ACETAMINOPHEN 325 MG PO TABS
650.0000 mg | ORAL_TABLET | Freq: Four times a day (QID) | ORAL | Status: DC | PRN
  Administered 2020-10-03 – 2020-10-06 (×5): 650 mg via ORAL
  Filled 2020-10-03 (×5): qty 2

## 2020-10-03 MED ORDER — AMIODARONE HCL IN DEXTROSE 360-4.14 MG/200ML-% IV SOLN
60.0000 mg/h | INTRAVENOUS | Status: AC
  Administered 2020-10-04: 60 mg/h via INTRAVENOUS
  Filled 2020-10-03: qty 200

## 2020-10-03 NOTE — H&P (Addendum)
History and Physical    Trayshawn K Martinique EUM:[id number] DOB: 09-08-1955 DOA: 10/03/2020  PCP: Jonetta Osgood, NP   Patient coming from: Home  I have personally briefly reviewed patient's old medical records in [place]  Chief Complaint: Weakness/fall Most of the history was obtained from his daughter Kenney Houseman over the phone  HPI: Maximus K [place]ique is a 65 y.o. male with medical history significant for prostate cancer on hormonal therapy, status post suprapubic catheter insertion, hypertension who presents to the emergency room via EMS for evaluation of weakness.  Patient states that he fell last night and was unable to get up and laid on the floor all night.  He has a chronic indwelling suprapubic catheter that was changed 3 weeks ago by home health nurse and is due to be changed again on 10/06/20. Patient is confused and unable to provide any history. Patient lives alone and has a neighbor who checks on him frequently.  He was seen in his usual state/normal in the afternoon, the day prior to his admission. I am unable to do review of systems on this patient due to his mental status changes. Labs show sodium 132, potassium 3.3, chloride 100, bicarb 23, glucose 118, BUN 10, creatinine 0.63, calcium 8.9, alkaline phosphatase 136, albumin 3.0, lipase 18, AST 53, ALT 25, total protein 6.6, BNP 147, troponin 20, white count 11.5, hemoglobin 13.1, hematocrit 36, MCV 106, RDW 12.7, platelet count 117 Respiratory viral panel is negative Chest x-ray reviewed by me shows no acute cardiopulmonary disease CT scan of the head without contrast shows no acute intracranial abnormality.  Sclerotic osseous metastatic disease. CT angiogram of the chest showed no definite evidence of pulmonary embolus. Minimal bibasilar subsegmental atelectasis. Coronary artery calcifications are noted. Hepatic cirrhosis. Diffuse sclerotic densities are noted throughout the visualized spine and ribs consistent with osseous  metastases. Aortic Atherosclerosis.     ED Course: Patient is a 65 year old Caucasian male with a history of metastatic prostate cancer, he also has a chronic indwelling suprapubic Foley catheter.  He was brought into the ER for evaluation of weakness following a fall and EMS reported a temp of 102.4 F, heart rate of 116, blood pressure 134/72 and respiratory rate of 24. Patient is oriented to person and place which is not his baseline.  He has pyuria.  He received a dose of IV antibiotics in the ER and will be admitted to the hospital for further evaluation.    Past Medical History:  Diagnosis Date   Cancer ([place])    colon   Cancer ([place])    bone cancer   Colon cancer ([place])    Hx of   History of kidney stones    Hypertension    Prostate cancer ([place])    Prostate cancer ([place])    Hx of    S/P colon resection    Shortness of breath dyspnea    Suprapubic catheter [facility])    Urinary retention     Past Surgical History:  Procedure Laterality Date   ABDOMINAL SURGERY     COLON SURGERY     CYSTOSCOPY WITH LITHOLAPAXY N/A 06/09/2017   Procedure: CYSTOSCOPY WITH LITHOLAPAXY;  Surgeon: Hollice Espy, MD;  Location: [facility] ORS;  Service: Urology;  Laterality: N/A;   PROSTATE SURGERY  05/25/2016   SUPRAPUBIC CATHETER INSERTION       reports that he has been smoking cigarettes. He has a 10.00 pack-year smoking history. He has never used smokeless tobacco. He reports current alcohol use. He  reports that he does not use drugs.  No Known Allergies  Family History  Problem Relation Age of Onset   Hypertension Other    Alcohol abuse Mother    Heart attack Father    Prostate cancer Brother    Breast cancer Sister       Prior to Admission medications   Medication Sig Start Date End Date Taking? Authorizing Provider  abiraterone acetate (ZYTIGA) 250 MG tablet TAKE 4 TABLETS (1,000 MG TOTAL) BY MOUTH DAILY. TAKE ON AN EMPTY STOMACH 1 HOUR BEFORE OR 2 HOURS AFTER A MEAL. 07/04/20 07/04/21   Lloyd Huger, MD  furosemide (LASIX) 20 MG tablet Take 1 tablet (20 mg total) by mouth 2 (two) times daily. 09/11/20   Jonetta Osgood, NP  ibuprofen (ADVIL) 800 MG tablet Take 1 tablet (800 mg total) by mouth every 8 (eight) hours as needed for mild pain or moderate pain. 07/29/20   Jacquelin Hawking, NP  mirabegron ER (MYRBETRIQ) 25 MG TB24 tablet Take 1 tablet (25 mg total) by mouth daily. 01/17/20   Luiz Ochoa, NP  oxybutynin (DITROPAN) 5 MG tablet Take 1 tablet (5 mg total) by mouth 4 (four) times daily. 09/11/20   Jonetta Osgood, NP  potassium chloride SA (KLOR-CON) 20 MEQ tablet Take 1 tablet (20 mEq total) by mouth 2 (two) times daily. 07/29/20   Jacquelin Hawking, NP  predniSONE (DELTASONE) 5 MG tablet TAKE 1 TABLET BY MOUTH EVERYDAY WITH BREAKFAST 07/04/20 07/04/21  Lloyd Huger, MD    Physical Exam: Vitals:   10/03/20 0910 10/03/20 0921 10/03/20 0945 10/03/20 1200  BP:  127/87  (!) 142/69  Pulse: (!) 105 (!) 103 (!) 105 (!) 108  Resp: (!) 23 (!) 22 20 19   Temp:      TempSrc:      SpO2: 97% 97% 98% 97%  Weight:      Height:         Vitals:   10/03/20 0910 10/03/20 0921 10/03/20 0945 10/03/20 1200  BP:  127/87  (!) 142/69  Pulse: (!) 105 (!) 103 (!) 105 (!) 108  Resp: (!) 23 (!) 22 20 19   Temp:      TempSrc:      SpO2: 97% 97% 98% 97%  Weight:      Height:          Constitutional: Alert and oriented x 2, person and place . Not in any apparent distress HEENT:      Head: Normocephalic and atraumatic.         Eyes: PERLA, EOMI, Conjunctivae are normal. Sclera is non-icteric.       Mouth/Throat: Mucous membranes are moist.       Neck: Supple with no signs of meningismus. Cardiovascular: Regular rate and rhythm. No murmurs, gallops, or rubs. 2+ symmetrical distal pulses are present . No JVD. No LE edema Respiratory: Respiratory effort normal .Lungs sounds clear bilaterally. No wheezes, crackles, or rhonchi.  Gastrointestinal: Soft, non tender, and non  distended with positive bowel sounds.  Genitourinary: No CVA tenderness.  Suprapubic catheter in place Musculoskeletal: Nontender with normal range of motion in all extremities. No cyanosis, or erythema of extremities. Neurologic:  Face is symmetric. Moving all extremities. No gross focal neurologic deficits . Skin: Skin is warm, dry.  No rash or ulcers Psychiatric: Mood and affect are normal    Labs on Admission: I have personally reviewed following labs and imaging studies  CBC: Recent Labs  Lab 10/03/20 0805  WBC 11.5*  NEUTROABS 10.1*  HGB 13.1  HCT 36.0*  MCV 106.8*  PLT 503*   Basic Metabolic Panel: Recent Labs  Lab 10/03/20 0805  NA 132*  K 3.3*  CL 100  CO2 23  GLUCOSE 118*  BUN 10  CREATININE 0.63  CALCIUM 8.9   GFR: Estimated Creatinine Clearance: 121.3 mL/min (by C-G formula based on SCr of 0.63 mg/dL). Liver Function Tests: Recent Labs  Lab 10/03/20 0805  AST 53*  ALT 25  ALKPHOS 136*  BILITOT 3.8*  PROT 6.6  ALBUMIN 3.0*   Recent Labs  Lab 10/03/20 0805  LIPASE 18   No results for input(s): AMMONIA in the last 168 hours. Coagulation Profile: No results for input(s): INR, PROTIME in the last 168 hours. Cardiac Enzymes: Recent Labs  Lab 10/03/20 0805  CKTOTAL 201   BNP (last 3 results) No results for input(s): PROBNP in the last 8760 hours. HbA1C: No results for input(s): HGBA1C in the last 72 hours. CBG: No results for input(s): GLUCAP in the last 168 hours. Lipid Profile: No results for input(s): CHOL, HDL, LDLCALC, TRIG, CHOLHDL, LDLDIRECT in the last 72 hours. Thyroid Function Tests: No results for input(s): TSH, T4TOTAL, FREET4, T3FREE, THYROIDAB in the last 72 hours. Anemia Panel: No results for input(s): VITAMINB12, FOLATE, FERRITIN, TIBC, IRON, RETICCTPCT in the last 72 hours. Urine analysis:    Component Value Date/Time   COLORURINE AMBER (A) 10/03/2020 1100   APPEARANCEUR HAZY (A) 10/03/2020 1100   APPEARANCEUR Clear  01/17/2020 0912   LABSPEC 1.015 10/03/2020 1100   LABSPEC 1.002 04/15/2014 1852   PHURINE 7.0 10/03/2020 1100   GLUCOSEU NEGATIVE 10/03/2020 1100   GLUCOSEU Negative 04/15/2014 1852   HGBUR SMALL (A) 10/03/2020 1100   BILIRUBINUR NEGATIVE 10/03/2020 1100   BILIRUBINUR neg 03/18/2020 1456   BILIRUBINUR Negative 01/17/2020 0912   BILIRUBINUR Negative 04/15/2014 1852   KETONESUR 5 (A) 10/03/2020 1100   PROTEINUR 30 (A) 10/03/2020 1100   UROBILINOGEN 0.2 03/18/2020 1456   NITRITE POSITIVE (A) 10/03/2020 1100   LEUKOCYTESUR MODERATE (A) 10/03/2020 1100   LEUKOCYTESUR Negative 04/15/2014 1852    Radiological Exams on Admission: CT Head W or Wo Contrast  Result Date: 10/03/2020 CLINICAL DATA:  Fall, metastatic prostate cancer EXAM: CT HEAD WITHOUT AND WITH CONTRAST TECHNIQUE: Contiguous axial images were obtained from the base of the skull through the vertex without and with intravenous contrast CONTRAST:  53mL OMNIPAQUE IOHEXOL 350 MG/ML SOLN COMPARISON:  Most recent prior head CT from 2012 FINDINGS: Brain: There is no acute intracranial hemorrhage, mass, mass effect, or edema. No abnormal enhancement. Gray-white differentiation is preserved. There is no extra-axial fluid collection. Prominence of the ventricles and sulci reflects generalized parenchymal volume loss. Patchy and confluent areas of hypoattenuation in the supratentorial white matter are nonspecific but probably reflect moderate chronic microvascular ischemic changes. Small area of right temporal encephalomalacia along the mastoid. There are chronic small vessel infarcts of the left caudate and frontal periventricular white matter. Vascular: There is atherosclerotic calcification at the skull base. Skull: Sclerotic foci are present throughout the calvarium and skull base likely reflecting metastases. Chronic nasal bone fractures Sinuses/Orbits: No acute finding. Other: None. IMPRESSION: No acute intracranial abnormality.  Chronic/nonemergent findings detailed above. Sclerotic osseous metastatic disease. Electronically Signed   By: Macy Mis M.D.   On: 10/03/2020 12:34   CT Angio Chest PE W/Cm &/Or Wo Cm  Result Date: 10/03/2020 CLINICAL DATA:  Back pain after fall last night. History of metastatic prostate  cancer. EXAM: CT ANGIOGRAPHY CHEST WITH CONTRAST TECHNIQUE: Multidetector CT imaging of the chest was performed using the standard protocol during bolus administration of intravenous contrast. Multiplanar CT image reconstructions and MIPs were obtained to evaluate the vascular anatomy. CONTRAST:  65mL OMNIPAQUE IOHEXOL 350 MG/ML SOLN COMPARISON:  Aug 31, 2018. FINDINGS: Cardiovascular: Satisfactory opacification of the pulmonary arteries to the segmental level. No evidence of pulmonary embolism. Normal heart size. No pericardial effusion. Atherosclerosis of thoracic aorta is noted without aneurysm formation. Coronary artery calcifications are noted. Mediastinum/Nodes: No enlarged mediastinal, hilar, or axillary lymph nodes. Thyroid gland, trachea, and esophagus demonstrate no significant findings. Lungs/Pleura: No pneumothorax or pleural effusion is noted. Mild emphysematous disease is noted in the upper lobes. Minimal bibasilar subsegmental atelectasis is noted. Upper Abdomen: Probable hepatic cirrhosis. Stable right adrenal adenoma. Musculoskeletal: Diffuse sclerotic densities are noted throughout the visualized spine and ribs consistent with metastatic disease. No fracture is noted. Review of the MIP images confirms the above findings. IMPRESSION: No definite evidence of pulmonary embolus. Minimal bibasilar subsegmental atelectasis. Coronary artery calcifications are noted. Hepatic cirrhosis. Diffuse sclerotic densities are noted throughout the visualized spine and ribs consistent with osseous metastases. Aortic Atherosclerosis (ICD10-I70.0) and Emphysema (ICD10-J43.9). Electronically Signed   By: [name] M.D.    On: 10/03/2020 12:46   DG Chest Port 1 View  Result Date: 10/03/2020 CLINICAL DATA:  Fever Chest pain Shortness of breath EXAM: PORTABLE CHEST 1 VIEW COMPARISON:  08/17/2016 FINDINGS: Heart size within normal limits. Unchanged elevation of the left hemidiaphragm. Diffuse osseous metastatic disease again seen. Lungs are clear. IMPRESSION: No acute cardiopulmonary process. Electronically Signed   By: Miachel Roux M.D.   On: 10/03/2020 08:54     Assessment/Plan Principal Problem:   Acute metabolic encephalopathy Active Problems:   Benign prostatic hyperplasia with urinary obstruction   Sepsis secondary to UTI [facility])   Prostate cancer ([place])   Weakness   Fall   Hypokalemia     Acute metabolic encephalopathy Most likely secondary to sepsis from UTI At baseline patient is usually awake, alert and oriented to person place and time but today on admission he is only oriented to person and place. Expect improvement in patient's mental status following resolution of acute infection.    Sepsis from UTI Patient has a chronic indwelling suprapubic catheter He presents for evaluation of weakness and is noted to have altered mental status, leukocytosis, pyuria, fever with a T-max of 102 and tachycardia Prior urine culture yields Klebsiella oxytoca sensitive to cephalosporins We will place patient on Rocephin 1 g IV daily empirically until urine culture results become available IV fluid hydration     History of metastatic prostate cancer Patient has a history of prostate cancer with mets to the bone Continue Zytiga and prednisone Follow-up with urology as an outpatient     Fall Secondary to generalized weakness, most likely related to sepsis from a UTI Place patient on fall precautions He will need PT evaluation once his acute illness has improved     Hypokalemia Secondary to diuretic use Supplement potassium   DVT prophylaxis: Lovenox  Code Status: full code  Family  Communication: Greater than 50% of time was spent discussing patient's condition and plan of care with his daughter and HPOA, Kenney Houseman over the phone.  All questions and concerns have been addressed.  She verbalizes understanding and agrees with the plan.  CODE STATUS was discussed the patient is a full code. Disposition Plan: Back to previous home environment Consults called:  none  Status: At the time of admission, it appears that the appropriate admission status for this patient is inpatient. This is judged to be reasonable and necessary in order to provide the required intensity of service to ensure the patient's safety given the presenting symptoms, physical exam findings, and initial radiographic and laboratory data in the context of their comorbid conditions. Patient requires inpatient status due to high intensity of service, high risk for further deterioration and high frequency of surveillance required.    Patsie Mccardle MD [facility]     10/03/2020, 2:26 PM

## 2020-10-03 NOTE — ED Notes (Signed)
Pts daughter and POA Lavella Lemons updated as to patient's status.[phone]

## 2020-10-03 NOTE — ED Provider Notes (Signed)
[facility] Emergency Department Provider Note   ____________________________________________   Event Date/Time   First MD Initiated Contact with Patient 10/03/20 0801     (approximate)  I have reviewed the triage vital signs and the nursing notes.   HISTORY  Chief Complaint Fall    HPI Gerald Odonnell is a 65 y.o. male with below stated past medical history and currently taking Zytiga plus prednisone for metastatic prostate cancer and presents via EMS after a fall last night in which she could not get up off the floor.  Patient states that he has been feeling more more fatigued over the last 3 months however has only had 1 other fall during that time.  Patient also endorses productive cough over the last few days.  Denies any subjective fevers/cold sweats.  Patient only complains of back pain due to "sleeping on the floor all night".  Patient is otherwise complaining of generalized weakness.  Patient currently denies any vision changes, tinnitus, difficulty speaking, facial droop, sore throat, chest pain, shortness of breath, abdominal pain, nausea/vomiting/diarrhea, dysuria, or numbness/paresthesias in any extremity         Past Medical History:  Diagnosis Date   Cancer ([place])    colon   Cancer ([place])    bone cancer   Colon cancer ([place])    Hx of   History of kidney stones    Hypertension    Prostate cancer ([place])    Prostate cancer ([place])    Hx of    S/P colon resection    Shortness of breath dyspnea    Suprapubic catheter [facility])    Urinary retention     Patient Active Problem List   Diagnosis Date Noted   Acute metabolic encephalopathy 57/84/6962   Weakness 10/03/2020   Fall 10/03/2020   Hypokalemia 10/03/2020   Atherosclerosis of aorta ([place]) 09/12/2020   Personal history of colon cancer 02/05/2020   Closed bimalleolar fracture 05/05/2018   Benign hypertension 08/22/2017   Encounter for screening colonoscopy 08/22/2017   Dysuria  08/22/2017   Urinary retention 03/15/2017   Prostate cancer ([place]) 03/15/2017   Abscess 01/24/2016   Sepsis secondary to UTI ([place]) 09/10/2015   Chest pain 09/10/2015   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   Clostridium difficile diarrhea    Colitis, infectious 04/23/2015   Incomplete bladder emptying 09/10/2014   Edema leg 09/15/2013   Pelvic and perineal pain 09/15/2013   Lower extremity venous stasis 09/15/2013   Pelvic pain in male 09/15/2013   Benign prostatic hyperplasia with urinary obstruction 07/22/2013    Past Surgical History:  Procedure Laterality Date   ABDOMINAL SURGERY     COLON SURGERY     CYSTOSCOPY WITH LITHOLAPAXY N/A 06/09/2017   Procedure: CYSTOSCOPY WITH LITHOLAPAXY;  Surgeon: Hollice Espy, MD;  Location: [facility] ORS;  Service: Urology;  Laterality: N/A;   PROSTATE SURGERY  05/25/2016   SUPRAPUBIC CATHETER INSERTION      Prior to Admission medications   Medication Sig Start Date End Date Taking? Authorizing Provider  abiraterone acetate (ZYTIGA) 250 MG tablet TAKE 4 TABLETS (1,000 MG TOTAL) BY MOUTH DAILY. TAKE ON AN EMPTY STOMACH 1 HOUR BEFORE OR 2 HOURS AFTER A MEAL. 07/04/20 07/04/21 Yes Lloyd Huger, MD  furosemide (LASIX) 20 MG tablet Take 1 tablet (20 mg total) by mouth 2 (two) times daily. 09/11/20  Yes Abernathy, Yetta Flock, NP  oxybutynin (DITROPAN) 5 MG tablet Take 1 tablet (5 mg total) by mouth 4 (four) times daily. 09/11/20  Yes  Jonetta Osgood, NP  ibuprofen (ADVIL) 800 MG tablet Take 1 tablet (800 mg total) by mouth every 8 (eight) hours as needed for mild pain or moderate pain. 07/29/20   Jacquelin Hawking, NP  mirabegron ER (MYRBETRIQ) 25 MG TB24 tablet Take 1 tablet (25 mg total) by mouth daily. Patient not taking: No sig reported 01/17/20   Luiz Ochoa, NP  potassium chloride SA (KLOR-CON) 20 MEQ tablet Take 1 tablet (20 mEq total) by mouth 2 (two) times daily. Patient not taking: No sig reported 07/29/20   Jacquelin Hawking, NP  predniSONE  (DELTASONE) 5 MG tablet TAKE 1 TABLET BY MOUTH EVERYDAY WITH BREAKFAST Patient not taking: No sig reported 07/04/20 07/04/21  Lloyd Huger, MD    Allergies Patient has no known allergies.  Family History  Problem Relation Age of Onset   Hypertension Other    Alcohol abuse Mother    Heart attack Father    Prostate cancer Brother    Breast cancer Sister     Social History Social History   Tobacco Use   Smoking status: Every Day    Packs/day: 0.25    Years: 40.00    Pack years: 10.00    Types: Cigarettes   Smokeless tobacco: Never   Tobacco comments:    1pack a week  Vaping Use   Vaping Use: Never used  Substance Use Topics   Alcohol use: Yes   Drug use: No    Review of Systems Constitutional: No fever/chills Eyes: No visual changes. ENT: No sore throat. Cardiovascular: Denies chest pain. Respiratory: Denies shortness of breath. Gastrointestinal: No abdominal pain.  No nausea, no vomiting.  No diarrhea. Genitourinary: Negative for dysuria. Musculoskeletal: Negative for acute arthralgias Skin: Negative for rash. Neurological: Endorses generalized weakness.  Negative for headaches, numbness/paresthesias in any extremity Psychiatric: Negative for suicidal ideation/homicidal ideation   ____________________________________________   PHYSICAL EXAM:  VITAL SIGNS: ED Triage Vitals  Enc Vitals Group     BP 10/03/20 0802 138/70     Pulse Rate 10/03/20 0802 (!) 111     Resp 10/03/20 0802 (!) 22     Temp 10/03/20 0802 97.8 F (36.6 C)     Temp Source 10/03/20 0802 Oral     SpO2 10/03/20 0802 97 %     Weight 10/03/20 0759 265 lb (120.2 kg)     Height 10/03/20 0759 5\' 10"  (1.778 m)     Head Circumference --      Peak Flow --      Pain Score --      Pain Loc --      Pain Edu? --      Excl. in [place]? --    Constitutional: Alert and oriented. Well appearing and in no acute distress. Eyes: Conjunctivae are normal. PERRL. Head: Atraumatic. Nose: No  congestion/rhinnorhea. Mouth/Throat: Mucous membranes are moist. Neck: No stridor Cardiovascular: Grossly normal heart sounds.  Good peripheral circulation. Respiratory: Normal respiratory effort.  No retractions. Gastrointestinal: Soft and nontender. No distention. Musculoskeletal: No obvious deformities Neurologic:  Normal speech and language. No gross focal neurologic deficits are appreciated. Skin:  Skin is warm and dry. No rash noted.  Scattered ecchymosis over bilateral upper extremities.  Bilateral lower extremities with 2+ pitting edema and xerosis Psychiatric: Mood and affect are normal. Speech and behavior are normal.  ____________________________________________   LABS (all labs ordered are listed, but only abnormal results are displayed)  Labs Reviewed  COMPREHENSIVE METABOLIC PANEL - Abnormal; Notable for the  following components:      Result Value   Sodium 132 (*)    Potassium 3.3 (*)    Glucose, Bld 118 (*)    Albumin 3.0 (*)    AST 53 (*)    Alkaline Phosphatase 136 (*)    Total Bilirubin 3.8 (*)    All other components within normal limits  BRAIN NATRIURETIC PEPTIDE - Abnormal; Notable for the following components:   B Natriuretic Peptide 147.1 (*)    All other components within normal limits  CBC WITH DIFFERENTIAL/PLATELET - Abnormal; Notable for the following components:   WBC 11.5 (*)    RBC 3.37 (*)    HCT 36.0 (*)    MCV 106.8 (*)    MCH 38.9 (*)    MCHC 36.4 (*)    Platelets 117 (*)    Neutro Abs 10.1 (*)    Lymphs Abs 0.4 (*)    Abs Immature Granulocytes 0.08 (*)    All other components within normal limits  URINALYSIS, COMPLETE (UACMP) WITH MICROSCOPIC - Abnormal; Notable for the following components:   Color, Urine AMBER (*)    APPearance HAZY (*)    Hgb urine dipstick SMALL (*)    Ketones, ur 5 (*)    Protein, ur 30 (*)    Nitrite POSITIVE (*)    Leukocytes,Ua MODERATE (*)    WBC, UA >50 (*)    Bacteria, UA MANY (*)    All other  components within normal limits  TROPONIN I (HIGH SENSITIVITY) - Abnormal; Notable for the following components:   Troponin I (High Sensitivity) 20 (*)    All other components within normal limits  RESP PANEL BY RT-PCR (FLU A&B, COVID) ARPGX2  URINE CULTURE  LIPASE, BLOOD  CK  LACTIC ACID, PLASMA  LACTIC ACID, PLASMA  HIV ANTIBODY (ROUTINE TESTING W REFLEX)  TROPONIN I (HIGH SENSITIVITY)   ____________________________________________  EKG  ED ECG REPORT I, Naaman Plummer, the attending physician, personally viewed and interpreted this ECG.  Date: 10/03/2020 EKG Time: 0801 Rate: 108 Rhythm: Tachycardic sinus rhythm QRS Axis: normal Intervals: normal ST/T Wave abnormalities: normal Narrative Interpretation: no evidence of acute ischemia  ____________________________________________  RADIOLOGY  ED MD interpretation: CT of the head without contrast shows no evidence of acute intracranial abnormalities  CT angiography of the chest shows minimal bibasilar subsegmental atelectasis without any evidence of of pulmonary emboli  One-view portable chest x-ray shows no evidence of acute abnormalities including no pneumonia, pneumothorax, or widened mediastinum  Official radiology report(s): CT Head W or Wo Contrast  Result Date: 10/03/2020 CLINICAL DATA:  Fall, metastatic prostate cancer EXAM: CT HEAD WITHOUT AND WITH CONTRAST TECHNIQUE: Contiguous axial images were obtained from the base of the skull through the vertex without and with intravenous contrast CONTRAST:  69mL OMNIPAQUE IOHEXOL 350 MG/ML SOLN COMPARISON:  Most recent prior head CT from 2012 FINDINGS: Brain: There is no acute intracranial hemorrhage, mass, mass effect, or edema. No abnormal enhancement. Gray-white differentiation is preserved. There is no extra-axial fluid collection. Prominence of the ventricles and sulci reflects generalized parenchymal volume loss. Patchy and confluent areas of hypoattenuation in the  supratentorial white matter are nonspecific but probably reflect moderate chronic microvascular ischemic changes. Small area of right temporal encephalomalacia along the mastoid. There are chronic small vessel infarcts of the left caudate and frontal periventricular white matter. Vascular: There is atherosclerotic calcification at the skull base. Skull: Sclerotic foci are present throughout the calvarium and skull base likely reflecting metastases. Chronic  nasal bone fractures Sinuses/Orbits: No acute finding. Other: None. IMPRESSION: No acute intracranial abnormality. Chronic/nonemergent findings detailed above. Sclerotic osseous metastatic disease. Electronically Signed   By: Macy Mis M.D.   On: 10/03/2020 12:34   CT Angio Chest PE W/Cm &/Or Wo Cm  Result Date: 10/03/2020 CLINICAL DATA:  Back pain after fall last night. History of metastatic prostate cancer. EXAM: CT ANGIOGRAPHY CHEST WITH CONTRAST TECHNIQUE: Multidetector CT imaging of the chest was performed using the standard protocol during bolus administration of intravenous contrast. Multiplanar CT image reconstructions and MIPs were obtained to evaluate the vascular anatomy. CONTRAST:  98mL OMNIPAQUE IOHEXOL 350 MG/ML SOLN COMPARISON:  Aug 31, 2018. FINDINGS: Cardiovascular: Satisfactory opacification of the pulmonary arteries to the segmental level. No evidence of pulmonary embolism. Normal heart size. No pericardial effusion. Atherosclerosis of thoracic aorta is noted without aneurysm formation. Coronary artery calcifications are noted. Mediastinum/Nodes: No enlarged mediastinal, hilar, or axillary lymph nodes. Thyroid gland, trachea, and esophagus demonstrate no significant findings. Lungs/Pleura: No pneumothorax or pleural effusion is noted. Mild emphysematous disease is noted in the upper lobes. Minimal bibasilar subsegmental atelectasis is noted. Upper Abdomen: Probable hepatic cirrhosis. Stable right adrenal adenoma. Musculoskeletal: Diffuse  sclerotic densities are noted throughout the visualized spine and ribs consistent with metastatic disease. No fracture is noted. Review of the MIP images confirms the above findings. IMPRESSION: No definite evidence of pulmonary embolus. Minimal bibasilar subsegmental atelectasis. Coronary artery calcifications are noted. Hepatic cirrhosis. Diffuse sclerotic densities are noted throughout the visualized spine and ribs consistent with osseous metastases. Aortic Atherosclerosis (ICD10-I70.0) and Emphysema (ICD10-J43.9). Electronically Signed   By: [name] M.D.   On: 10/03/2020 12:46   DG Chest Port 1 View  Result Date: 10/03/2020 CLINICAL DATA:  Fever Chest pain Shortness of breath EXAM: PORTABLE CHEST 1 VIEW COMPARISON:  08/17/2016 FINDINGS: Heart size within normal limits. Unchanged elevation of the left hemidiaphragm. Diffuse osseous metastatic disease again seen. Lungs are clear. IMPRESSION: No acute cardiopulmonary process. Electronically Signed   By: Miachel Roux M.D.   On: 10/03/2020 08:54    ____________________________________________   PROCEDURES  Procedure(s) performed (including Critical Care):  Procedures   ____________________________________________   INITIAL IMPRESSION / ASSESSMENT AND PLAN / ED COURSE  As part of my medical decision making, I reviewed the following data within the [place] notes reviewed and incorporated, Labs reviewed, EKG interpreted, Old chart reviewed, Radiograph reviewed and Notes from prior ED visits reviewed and incorporated      No e/o epididymo-orchitis on exam and low suspicion for rectal abscess, prostatitis, other GU deep space infection, gonorrhea/chlamydia. Unlikely Infected Urolithiasis, AAA, cholecystitis, pancreatitis, SBO, appendicitis, or other acute abdomen. Workup: UA: Nitrite positive, moderate bacteria Tx: Rocephin Given persistent encephalopathy, patient will require admission to the internal  medicine service for further evaluation and management Disposition: Admit      ____________________________________________   FINAL CLINICAL IMPRESSION(S) / ED DIAGNOSES  Final diagnoses:  Fall, initial encounter  Lower urinary tract infectious disease  Encephalopathy acute     ED Discharge Orders     None        Note:  This document was prepared using Dragon voice recognition software and may include unintentional dictation errors.    Naaman Plummer, MD 10/03/20 [phone]

## 2020-10-03 NOTE — Progress Notes (Signed)
PHARMACIST - PHYSICIAN COMMUNICATION  CONCERNING:  Enoxaparin (Lovenox) for DVT Prophylaxis    RECOMMENDATION: Patient was prescribed enoxaprin 40mg  q24 hours for VTE prophylaxis.   Filed Weights   10/03/20 0759  Weight: 120.2 kg (265 lb)    Body mass index is 38.02 kg/m.  Estimated Creatinine Clearance: 121.3 mL/min (by C-G formula based on SCr of 0.63 mg/dL).   Based on [place] patient is candidate for enoxaparin 0.5mg /kg TBW SQ every 24 hours based on BMI being >30.   DESCRIPTION: Pharmacy has adjusted enoxaparin dose per [facility] policy.  Patient is now receiving enoxaparin 60 mg every 24 hours    Dorothe Pea, PharmD, BCPS Clinical Pharmacist  10/03/2020 1:59 PM

## 2020-10-03 NOTE — ED Notes (Signed)
ED Provider at bedside. 

## 2020-10-03 NOTE — ED Notes (Signed)
X-ray at bedside

## 2020-10-03 NOTE — ED Notes (Signed)
Patient transported to CT 

## 2020-10-03 NOTE — ED Notes (Signed)
Informed RN bed assigned 

## 2020-10-03 NOTE — ED Triage Notes (Signed)
Pt BIBA from home for a fall last night and was unable to get up. He c/o back pain from sleeping on the floor all night after the fall. Pt has foley that was changed x 3 weeks ago. EMS reports temp 102.4, HR 116, BP 134/72. 24 RR. CBG 138.

## 2020-10-04 ENCOUNTER — Inpatient Hospital Stay: Payer: Medicare HMO

## 2020-10-04 ENCOUNTER — Encounter: Payer: Self-pay | Admitting: Internal Medicine

## 2020-10-04 DIAGNOSIS — I959 Hypotension, unspecified: Secondary | ICD-10-CM

## 2020-10-04 DIAGNOSIS — I4891 Unspecified atrial fibrillation: Secondary | ICD-10-CM

## 2020-10-04 DIAGNOSIS — E876 Hypokalemia: Secondary | ICD-10-CM

## 2020-10-04 LAB — CBC
HCT: 34.7 % — ABNORMAL LOW (ref 39.0–52.0)
Hemoglobin: 12.2 g/dL — ABNORMAL LOW (ref 13.0–17.0)
MCH: 39.2 pg — ABNORMAL HIGH (ref 26.0–34.0)
MCHC: 35.2 g/dL (ref 30.0–36.0)
MCV: 111.6 fL — ABNORMAL HIGH (ref 80.0–100.0)
Platelets: 100 10*3/uL — ABNORMAL LOW (ref 150–400)
RBC: 3.11 MIL/uL — ABNORMAL LOW (ref 4.22–5.81)
RDW: 13.2 % (ref 11.5–15.5)
WBC: 8.9 10*3/uL (ref 4.0–10.5)
nRBC: 0.2 % (ref 0.0–0.2)

## 2020-10-04 LAB — BASIC METABOLIC PANEL
Anion gap: 6 (ref 5–15)
BUN: 10 mg/dL (ref 8–23)
CO2: 23 mmol/L (ref 22–32)
Calcium: 8.2 mg/dL — ABNORMAL LOW (ref 8.9–10.3)
Chloride: 107 mmol/L (ref 98–111)
Creatinine, Ser: 0.68 mg/dL (ref 0.61–1.24)
GFR, Estimated: >60 mL/min (ref >60)
Glucose, Bld: 95 mg/dL (ref 70–99)
Potassium: 4.3 mmol/L (ref 3.5–5.1)
Sodium: 136 mmol/L (ref 135–145)

## 2020-10-04 LAB — URINE CULTURE

## 2020-10-04 LAB — BLOOD GAS, VENOUS
Acid-base deficit: 3 mmol/L — ABNORMAL HIGH (ref 0.0–2.0)
Bicarbonate: 20.8 mmol/L (ref 20.0–28.0)
O2 Saturation: 76.1 %
Patient temperature: 37
pCO2, Ven: 32 mmHg — ABNORMAL LOW (ref 44.0–60.0)
pH, Ven: 7.42 (ref 7.250–7.430)
pO2, Ven: 40 mmHg (ref 32.0–45.0)

## 2020-10-04 LAB — CORTISOL-AM, BLOOD: Cortisol - AM: 16.9 ug/dL (ref 6.7–22.6)

## 2020-10-04 LAB — MAGNESIUM: Magnesium: 2.2 mg/dL (ref 1.7–2.4)

## 2020-10-04 LAB — PROCALCITONIN: Procalcitonin: 1.85 ng/mL

## 2020-10-04 LAB — LACTIC ACID, PLASMA: Lactic Acid, Venous: 2 mmol/L (ref 0.5–1.9)

## 2020-10-04 LAB — TROPONIN I (HIGH SENSITIVITY)
Troponin I (High Sensitivity): 33 ng/L — ABNORMAL HIGH (ref <18)
Troponin I (High Sensitivity): 38 ng/L — ABNORMAL HIGH (ref <18)
Troponin I (High Sensitivity): 36 ng/L — ABNORMAL HIGH (ref <18)

## 2020-10-04 LAB — PROTIME-INR
INR: 1.5 — ABNORMAL HIGH (ref 0.8–1.2)
Prothrombin Time: 17.6 seconds — ABNORMAL HIGH (ref 11.4–15.2)

## 2020-10-04 MED ORDER — MORPHINE SULFATE (PF) 2 MG/ML IV SOLN
2.0000 mg | INTRAVENOUS | Status: DC | PRN
  Administered 2020-10-04 (×5): 2 mg via INTRAVENOUS
  Filled 2020-10-04 (×5): qty 1

## 2020-10-04 MED ORDER — OXYCODONE HCL 5 MG PO TABS
5.0000 mg | ORAL_TABLET | Freq: Four times a day (QID) | ORAL | Status: DC | PRN
  Administered 2020-10-04 – 2020-10-06 (×5): 5 mg via ORAL
  Filled 2020-10-04 (×5): qty 1

## 2020-10-04 MED ORDER — ACETAMINOPHEN 500 MG PO TABS
1000.0000 mg | ORAL_TABLET | Freq: Once | ORAL | Status: AC
  Administered 2020-10-04: 1000 mg via ORAL
  Filled 2020-10-04: qty 2

## 2020-10-04 NOTE — Progress Notes (Signed)
[facility] PROGRESS NOTE    Gerald Odonnell  BMW:[id number] DOB: 1955-08-07 DOA: 10/03/2020 PCP: Gerald Osgood, NP      Brief Narrative:  [name] is a 65 y.o. M with metastatic prostate cancer on [place], [place] catheter, recurrent UTI, and HTN who presented with decreased mentation, fever.  Evidently the patient fell last night, unable to get up all night and lay on the floor. In the ER, he was confused, febrile, tachycardic, and tachypneic greater than 22.  Chest x-ray clear, CT angiogram unremarkable, urinalysis showed pyuria.  Started on IV antibiotics and admitted.        Assessment & Plan:  Severe sepsis with end organ dysfunction due to SP catheter associated infection Presented with somnolence confusion, respiratory rate greater than 22, fever and tachycardia in the setting of UTI.  SP catheter was changed on admission - Continue ceftriaxone - Obtain blood cultures - Trend lactate to normal  -Hold oxybutynin   Prostate cancer -Continue predisone and abiraterone  Atrial fibrillation with RVR -Continue amiodarone  Hypokalemia and hypomagnesemia Repleted  Thrombocytopenia Platelets 100K, due to sepsis.    Hypertension BP soft -Hold furosemide     Disposition: Status is: Inpatient  Remains inpatient appropriate because:Hemodynamically unstable  Dispo: The patient is from: Home              Anticipated d/c is to: SNF              Patient currently is not medically stable to d/c.   Difficult to place patient No       Level of care: Progressive Cardiac       MDM: The below labs and imaging reports were reviewed and summarized above.  Medication management as above.This is a severe acute illness with threat to life  DVT prophylaxis: Lovneox  Code Status: FULL Family Communication:              Subjective: Somewhat confused, complains of penis pain, short of breath, chest discomfort, weak,  nauseated.  Objective: Vitals:   10/04/20 1100 10/04/20 1214 10/04/20 1246 10/04/20 1619  BP: 102/75  108/63 123/75  Pulse: 92  93 99  Resp: (!) 28  (!) 23 18  Temp:  99.8 F (37.7 C) 98.9 F (37.2 C) 99.3 F (37.4 C)  TempSrc:  Oral Oral Oral  SpO2: 96%  98% 100%  Weight:      Height:        Intake/Output Summary (Last 24 hours) at 10/04/2020 1722 Last data filed at 10/04/2020 1330 Gross per 24 hour  Intake 220 ml  Output 850 ml  Net -630 ml   Filed Weights   10/03/20 0759  Weight: 120.2 kg    Examination: General appearance: Obese adult male, awake, in moderate distress.  Appears to be favoring. HEENT: Anicteric, conjunctiva pink, lids and lashes normal. No nasal deformity, discharge, epistaxis.  Lips moist.   Skin: Warm and dry.  no jaundice.  Cap refill normal.  No suspicious rashes or lesions. Cardiac: Echocardiac, regular, nl S1-S2, no murmurs appreciated.  Capillary refill is brisk.  JVP not visible.  No LE edema.  Radial  pulses 2+ and symmetric. Respiratory: Normal respiratory rate and rhythm.  CTAB without rales or wheezes. Abdomen: Abdomen soft.  no TTP. No ascites, distension, hepatosplenomegaly.   MSK: No deformities or effusions. Neuro: Awake and a little bit confused.  EOMI, moves all extremities with severe generalized weakness but symmetric strength. Speech fluent.    Psych:  Sensorium intact and responding to questions, attention blunted.  Affect blunted.  Judgment and insight appear impaired.    Data Reviewed: I have personally reviewed following labs and imaging studies:  CBC: Recent Labs  Lab 10/03/20 0805 10/04/20 0624  WBC 11.5* 8.9  NEUTROABS 10.1*  --   HGB 13.1 12.2*  HCT 36.0* 34.7*  MCV 106.8* 111.6*  PLT 117* 937*   Basic Metabolic Panel: Recent Labs  Lab 10/03/20 0805 10/03/20 1010 10/04/20 0624  NA 132*  --  136  K 3.3*  --  4.3  CL 100  --  107  CO2 23  --  23  GLUCOSE 118*  --  95  BUN 10  --  10  CREATININE 0.63  --   0.68  CALCIUM 8.9  --  8.2*  MG  --  1.6* 2.2   GFR: Estimated Creatinine Clearance: 121.3 mL/min (by C-G formula based on SCr of 0.68 mg/dL). Liver Function Tests: Recent Labs  Lab 10/03/20 0805  AST 53*  ALT 25  ALKPHOS 136*  BILITOT 3.8*  PROT 6.6  ALBUMIN 3.0*   Recent Labs  Lab 10/03/20 0805  LIPASE 18   No results for input(s): AMMONIA in the last 168 hours. Coagulation Profile: Recent Labs  Lab 10/04/20 0624  INR 1.5*   Cardiac Enzymes: Recent Labs  Lab 10/03/20 0805  CKTOTAL 201   BNP (last 3 results) No results for input(s): PROBNP in the last 8760 hours. HbA1C: No results for input(s): HGBA1C in the last 72 hours. CBG: No results for input(s): GLUCAP in the last 168 hours. Lipid Profile: No results for input(s): CHOL, HDL, LDLCALC, TRIG, CHOLHDL, LDLDIRECT in the last 72 hours. Thyroid Function Tests: No results for input(s): TSH, T4TOTAL, FREET4, T3FREE, THYROIDAB in the last 72 hours. Anemia Panel: No results for input(s): VITAMINB12, FOLATE, FERRITIN, TIBC, IRON, RETICCTPCT in the last 72 hours. Urine analysis:    Component Value Date/Time   COLORURINE AMBER (A) 10/03/2020 1100   APPEARANCEUR HAZY (A) 10/03/2020 1100   APPEARANCEUR Clear 01/17/2020 0912   LABSPEC 1.015 10/03/2020 1100   LABSPEC 1.002 04/15/2014 1852   PHURINE 7.0 10/03/2020 1100   GLUCOSEU NEGATIVE 10/03/2020 1100   GLUCOSEU Negative 04/15/2014 1852   HGBUR SMALL (A) 10/03/2020 1100   BILIRUBINUR NEGATIVE 10/03/2020 1100   BILIRUBINUR neg 03/18/2020 1456   BILIRUBINUR Negative 01/17/2020 0912   BILIRUBINUR Negative 04/15/2014 1852   KETONESUR 5 (A) 10/03/2020 1100   PROTEINUR 30 (A) 10/03/2020 1100   UROBILINOGEN 0.2 03/18/2020 1456   NITRITE POSITIVE (A) 10/03/2020 1100   LEUKOCYTESUR MODERATE (A) 10/03/2020 1100   LEUKOCYTESUR Negative 04/15/2014 1852   Sepsis Labs: @LABRCNTIP (procalcitonin:4,lacticacidven:4)  ) Recent Results (from the past 240 hour(s))  Resp  Panel by RT-PCR (Flu A&B, Covid) Nasopharyngeal Swab     Status: None   Collection Time: 10/03/20  8:05 AM   Specimen: Nasopharyngeal Swab; Nasopharyngeal(NP) swabs in vial transport medium  Result Value Ref Range Status   SARS Coronavirus 2 by RT PCR NEGATIVE NEGATIVE Final    Comment: (NOTE) SARS-CoV-2 target nucleic acids are NOT DETECTED.  The SARS-CoV-2 RNA is generally detectable in upper respiratory specimens during the acute phase of infection. The lowest concentration of SARS-CoV-2 viral copies this assay can detect is 138 copies/mL. A negative result does not preclude SARS-Cov-2 infection and should not be used as the sole basis for treatment or other patient management decisions. A negative result may occur with  improper specimen collection/handling, submission  of specimen other than nasopharyngeal swab, presence of viral mutation(s) within the areas targeted by this assay, and inadequate number of viral copies(<138 copies/mL). A negative result must be combined with clinical observations, patient history, and epidemiological information. The expected result is Negative.  Fact Sheet for Patients:  EntrepreneurPulse.com.au  Fact Sheet for Healthcare Providers:  IncredibleEmployment.be  This test is no t yet approved or cleared by the [place] FDA and  has been authorized for detection and/or diagnosis of SARS-CoV-2 by FDA under an Emergency Use Authorization (EUA). This EUA will remain  in effect (meaning this test can be used) for the duration of the COVID-19 declaration under Section 564(b)(1) of the Act, 21 U.S.C.section 360bbb-3(b)(1), unless the authorization is terminated  or revoked sooner.       Influenza A by PCR NEGATIVE NEGATIVE Final   Influenza B by PCR NEGATIVE NEGATIVE Final    Comment: (NOTE) The Xpert Xpress SARS-CoV-2/FLU/RSV plus assay is intended as an aid in the diagnosis of influenza from Nasopharyngeal  swab specimens and should not be used as a sole basis for treatment. Nasal washings and aspirates are unacceptable for Xpert Xpress SARS-CoV-2/FLU/RSV testing.  Fact Sheet for Patients: EntrepreneurPulse.com.au  Fact Sheet for Healthcare Providers: IncredibleEmployment.be  This test is not yet approved or cleared by the [place] FDA and has been authorized for detection and/or diagnosis of SARS-CoV-2 by FDA under an Emergency Use Authorization (EUA). This EUA will remain in effect (meaning this test can be used) for the duration of the COVID-19 declaration under Section 564(b)(1) of the Act, 21 U.S.C. section 360bbb-3(b)(1), unless the authorization is terminated or revoked.  Performed at [facility], [address] [place], [place] [address]   Urine culture     Status: Abnormal   Collection Time: 10/03/20 11:31 AM   Specimen: Urine, Random  Result Value Ref Range Status   Specimen Description   Final    URINE, RANDOM Performed at [facility], [address], [place], [place] [address]    Special Requests   Final    NONE Performed at [facility], [address] [place], [place] [address]    Culture MULTIPLE SPECIES PRESENT, SUGGEST RECOLLECTION (A)  Final   Report Status 10/04/2020 FINAL  Final         Radiology Studies: CT Head W or Wo Contrast  Result Date: 10/03/2020 CLINICAL DATA:  Fall, metastatic prostate cancer EXAM: CT HEAD WITHOUT AND WITH CONTRAST TECHNIQUE: Contiguous axial images were obtained from the base of the skull through the vertex without and with intravenous contrast CONTRAST:  74mL OMNIPAQUE IOHEXOL 350 MG/ML SOLN COMPARISON:  Most recent prior head CT from 2012 FINDINGS: Brain: There is no acute intracranial hemorrhage, mass, mass effect, or edema. No abnormal enhancement. Gray-white differentiation is preserved. There is no extra-axial fluid collection. Prominence of the  ventricles and sulci reflects generalized parenchymal volume loss. Patchy and confluent areas of hypoattenuation in the supratentorial white matter are nonspecific but probably reflect moderate chronic microvascular ischemic changes. Small area of right temporal encephalomalacia along the mastoid. There are chronic small vessel infarcts of the left caudate and frontal periventricular white matter. Vascular: There is atherosclerotic calcification at the skull base. Skull: Sclerotic foci are present throughout the calvarium and skull base likely reflecting metastases. Chronic nasal bone fractures Sinuses/Orbits: No acute finding. Other: None. IMPRESSION: No acute intracranial abnormality. Chronic/nonemergent findings detailed above. Sclerotic osseous metastatic disease. Electronically Signed   By: Addison Lank.D.  On: 10/03/2020 12:34   CT Angio Chest PE W/Cm &/Or Wo Cm  Result Date: 10/03/2020 CLINICAL DATA:  Back pain after fall last night. History of metastatic prostate cancer. EXAM: CT ANGIOGRAPHY CHEST WITH CONTRAST TECHNIQUE: Multidetector CT imaging of the chest was performed using the standard protocol during bolus administration of intravenous contrast. Multiplanar CT image reconstructions and MIPs were obtained to evaluate the vascular anatomy. CONTRAST:  38mL OMNIPAQUE IOHEXOL 350 MG/ML SOLN COMPARISON:  Aug 31, 2018. FINDINGS: Cardiovascular: Satisfactory opacification of the pulmonary arteries to the segmental level. No evidence of pulmonary embolism. Normal heart size. No pericardial effusion. Atherosclerosis of thoracic aorta is noted without aneurysm formation. Coronary artery calcifications are noted. Mediastinum/Nodes: No enlarged mediastinal, hilar, or axillary lymph nodes. Thyroid gland, trachea, and esophagus demonstrate no significant findings. Lungs/Pleura: No pneumothorax or pleural effusion is noted. Mild emphysematous disease is noted in the upper lobes. Minimal bibasilar subsegmental  atelectasis is noted. Upper Abdomen: Probable hepatic cirrhosis. Stable right adrenal adenoma. Musculoskeletal: Diffuse sclerotic densities are noted throughout the visualized spine and ribs consistent with metastatic disease. No fracture is noted. Review of the MIP images confirms the above findings. IMPRESSION: No definite evidence of pulmonary embolus. Minimal bibasilar subsegmental atelectasis. Coronary artery calcifications are noted. Hepatic cirrhosis. Diffuse sclerotic densities are noted throughout the visualized spine and ribs consistent with osseous metastases. Aortic Atherosclerosis (ICD10-I70.0) and Emphysema (ICD10-J43.9). Electronically Signed   By: [name] M.D.   On: 10/03/2020 12:46   DG Chest Port 1 View  Result Date: 10/04/2020 CLINICAL DATA:  Chest pain EXAM: PORTABLE CHEST 1 VIEW COMPARISON:  Radiograph 10/03/2020, CT 10/03/2020 FINDINGS: Unchanged cardiomediastinal silhouette. Unchanged elevated left hemidiaphragm with left basilar atelectasis. There is also right basilar atelectasis. There is no new focal airspace disease. No large pleural effusion or visible pneumothorax. Diffuse osseous sclerotic lesions are again seen. IMPRESSION: No new focal airspace disease. Unchanged elevated left hemidiaphragm and basilar subsegmental atelectasis. Diffuse sclerotic osseous metastatic disease again seen. Electronically Signed   By: Maurine Simmering   On: 10/04/2020 10:40   DG Chest Port 1 View  Result Date: 10/03/2020 CLINICAL DATA:  Fever Chest pain Shortness of breath EXAM: PORTABLE CHEST 1 VIEW COMPARISON:  08/17/2016 FINDINGS: Heart size within normal limits. Unchanged elevation of the left hemidiaphragm. Diffuse osseous metastatic disease again seen. Lungs are clear. IMPRESSION: No acute cardiopulmonary process. Electronically Signed   By: Miachel Roux M.D.   On: 10/03/2020 08:54        Scheduled Meds:  abiraterone acetate  1,000 mg Oral Daily   enoxaparin (LOVENOX) injection  60  mg Subcutaneous Q24H   oxybutynin  5 mg Oral QID   predniSONE  5 mg Oral Q breakfast   Continuous Infusions:  0.9 % NaCl with KCl 40 mEq / L 125 mL/hr at 10/04/20 0845   amiodarone 30 mg/hr (10/04/20 0607)   cefTRIAXone (ROCEPHIN)  IV 1 g (10/04/20 1450)     LOS: 1 day    Time spent: 35 minutes    Edwin Dada, MD [facility] 10/04/2020, 5:22 PM     Please page though [place] or Epic secure chat:  For [employer], Adult nurse

## 2020-10-04 NOTE — Telephone Encounter (Signed)
Home health calling with FYI that pt fell again and in the hospital with UTI.

## 2020-10-04 NOTE — Progress Notes (Signed)
Critical care note:  Date of note: 10/04/2020  Subjective: The patient started having chest pain felt this pressure with palpitations and was noted to be in atrial fibrillation with rapid ventricular sponsor that was 160-190.  He was mildly dyspneic without wheezing.  No fever or chills.  No nausea or vomiting or abdominal pain.  Objective: Physical examination: Generally: Acutely ill elderly Caucasian male in mild respiratory distress with conversational dyspnea. Vital signs per history of present illness. Head - atraumatic, normocephalic.  Pupils - equal, round and reactive to light and accommodation. Extraocular movements are intact. No scleral icterus.  Oropharynx - moist mucous membranes and tongue. No pharyngeal erythema or exudate.  Neck - supple. No JVD. Carotid pulses 2+ bilaterally. No carotid bruits. No palpable thyromegaly or lymphadenopathy. Cardiovascular -irregularly irregular tachycardic rhythm.  Normal S1 and S2. No murmurs, gallops or rubs.  Lungs -clear to auscultation bilaterally.. Abdomen - soft and nontender. Positive bowel sounds. No palpable organomegaly or masses.  Extremities - no pitting edema, clubbing or cyanosis.  Neuro - grossly non-focal. Skin - no rashes. GU and rectal exam - deferred.  Labs and notes were reviewed.  Assessment/plan: 1.  Atrial fibrillation with rapid ventricular response. - The patient was given 10 mg of IV Cardizem bolus and became hypotensive with blood pressure that went down to 79/54. - IV Cardizem drip was canceled before being started then the patient was placed on IV amiodarone with a bolus followed by drip.  2.  Hypotension. - The patient was given a bolus of 250 mL IV normal saline with good response and improvement of blood pressure to 103/55.  3.  Hypokalemia and hypomagnesemia.  This could be contributing to #1. - Aggressive potassium replacement was ordered and magnesium level was ordered, with magnesium replacement as  needed.  Magnesium came back 1.6.  The patient was given 2 g of IV magnesium sulfate.  Will continue to closely monitor the patient.  Authorized and performed by: Eugenie Norrie, MD Total critical care time: Approximately   35    minutes. Due to a high probability of clinically significant, life-threatening deterioration, the patient required my highest level of preparedness to intervene emergently and I personally spent this critical care time directly and personally managing the patient.  This critical care time included obtaining a history, examining the patient, pulse oximetry, ordering and review of studies, arranging urgent treatment with development of management plan, evaluation of patient's response to treatment, frequent reassessment, and discussions with other providers. This critical care time was performed to assess and manage the high probability of imminent, life-threatening deterioration that could result in multiorgan failure.  It was exclusive of separately billable procedures and treating other patients and teaching time.  Please see MDM section and the rest of the note for further information on patient assessment and treatment.

## 2020-10-04 NOTE — ED Notes (Signed)
PT alert, oriented to self and situation. Pt informed of admission plan.

## 2020-10-04 NOTE — ED Notes (Signed)
X-ray at bedside

## 2020-10-04 NOTE — ED Notes (Signed)
Daughter/POA Kenney Houseman updated on plan of care- states she is going to drive to be with pt in the hospital.

## 2020-10-05 DIAGNOSIS — C61 Malignant neoplasm of prostate: Secondary | ICD-10-CM

## 2020-10-05 DIAGNOSIS — G9340 Encephalopathy, unspecified: Secondary | ICD-10-CM

## 2020-10-05 DIAGNOSIS — R6520 Severe sepsis without septic shock: Secondary | ICD-10-CM

## 2020-10-05 LAB — BASIC METABOLIC PANEL
Anion gap: 8 (ref 5–15)
BUN: 11 mg/dL (ref 8–23)
CO2: 19 mmol/L — ABNORMAL LOW (ref 22–32)
Calcium: 8 mg/dL — ABNORMAL LOW (ref 8.9–10.3)
Chloride: 108 mmol/L (ref 98–111)
Creatinine, Ser: 0.56 mg/dL — ABNORMAL LOW (ref 0.61–1.24)
GFR, Estimated: >60 mL/min (ref >60)
Glucose, Bld: 76 mg/dL (ref 70–99)
Potassium: 3.6 mmol/L (ref 3.5–5.1)
Sodium: 135 mmol/L (ref 135–145)

## 2020-10-05 LAB — CBC
HCT: 31.8 % — ABNORMAL LOW (ref 39.0–52.0)
Hemoglobin: 10.9 g/dL — ABNORMAL LOW (ref 13.0–17.0)
MCH: 38.5 pg — ABNORMAL HIGH (ref 26.0–34.0)
MCHC: 34.3 g/dL (ref 30.0–36.0)
MCV: 112.4 fL — ABNORMAL HIGH (ref 80.0–100.0)
Platelets: 87 10*3/uL — ABNORMAL LOW (ref 150–400)
RBC: 2.83 MIL/uL — ABNORMAL LOW (ref 4.22–5.81)
RDW: 12.9 % (ref 11.5–15.5)
WBC: 6.5 10*3/uL (ref 4.0–10.5)
nRBC: 0 % (ref 0.0–0.2)

## 2020-10-05 LAB — LACTIC ACID, PLASMA: Lactic Acid, Venous: 1.1 mmol/L (ref 0.5–1.9)

## 2020-10-05 MED ORDER — DILTIAZEM HCL 25 MG/5ML IV SOLN: 10.0000 mg | Freq: Once | INTRAVENOUS | Status: AC

## 2020-10-05 MED ORDER — POTASSIUM CHLORIDE CRYS ER 20 MEQ PO TBCR
40.0000 meq | EXTENDED_RELEASE_TABLET | Freq: Once | ORAL | Status: AC
  Administered 2020-10-05: 40 meq via ORAL
  Filled 2020-10-05: qty 2

## 2020-10-05 MED ORDER — METOPROLOL TARTRATE 25 MG PO TABS
25.0000 mg | ORAL_TABLET | Freq: Two times a day (BID) | ORAL | Status: DC
  Administered 2020-10-05 – 2020-10-11 (×12): 25 mg via ORAL
  Filled 2020-10-05 (×13): qty 1

## 2020-10-05 MED ORDER — DILTIAZEM HCL-DEXTROSE 125-5 MG/125ML-% IV SOLN (PREMIX)
5.0000 mg/h | INTRAVENOUS | Status: DC
  Administered 2020-10-05: 10 mg/h via INTRAVENOUS
  Administered 2020-10-05: 12.5 mg/h via INTRAVENOUS
  Administered 2020-10-05: 5 mg/h via INTRAVENOUS
  Administered 2020-10-05 (×2): 15 mg/h via INTRAVENOUS
  Filled 2020-10-05 (×2): qty 125

## 2020-10-05 MED ORDER — SODIUM CHLORIDE 0.9 % IV SOLN: INTRAVENOUS | Status: DC

## 2020-10-05 MED ORDER — ALBUMIN HUMAN 25 % IV SOLN
25.0000 g | Freq: Once | INTRAVENOUS | Status: AC
  Administered 2020-10-05: 25 g via INTRAVENOUS
  Filled 2020-10-05: qty 100

## 2020-10-05 MED ORDER — SODIUM CHLORIDE 0.9 % IV BOLUS
1000.0000 mL | Freq: Once | INTRAVENOUS | Status: AC
  Administered 2020-10-05: 1000 mL via INTRAVENOUS

## 2020-10-05 MED ORDER — DILTIAZEM HCL 25 MG/5ML IV SOLN
INTRAVENOUS | Status: AC
  Administered 2020-10-05: 10 mg via INTRAVENOUS
  Filled 2020-10-05: qty 5

## 2020-10-05 NOTE — Progress Notes (Signed)
PT Cancellation Note  Patient Details Name: Gerald Odonnell MRN: [id number] DOB: Jan 06, 1956   Cancelled Treatment:    Reason Eval/Treat Not Completed: Medical issues which prohibited therapy Spoke with nurse who report that despite 2 cardiac meds HR has still be very elevated (160s+) and she reports that pt will not be appropriate for PT today.  Will maintain on caseload and check back tomorrow to assess appropriateness for initiation of activity with PT.     Kreg Shropshire, DPT 10/05/2020, 9:50 AM

## 2020-10-05 NOTE — Progress Notes (Signed)
   10/05/20 0844  Assess: MEWS Score  Temp 99.6 F (37.6 C)  BP 127/80  Pulse Rate (!) 161  ECG Heart Rate (!) 149  Resp (!) 26  SpO2 97 %  O2 Device Nasal Cannula  Assess: MEWS Score  MEWS Temp 0  MEWS Systolic 0  MEWS Pulse 3  MEWS RR 2  MEWS LOC 0  MEWS Score 5  MEWS Score Color Red  Assess: if the MEWS score is Yellow or Red  Were vital signs taken at a resting state? Yes  Focused Assessment No change from prior assessment  Early Detection of Sepsis Score *See Row Information* Medium  MEWS guidelines implemented *See Row Information* No, previously red, continue vital signs every 4 hours  Treat  MEWS Interventions Other (Comment) (notified CN Linard Millers RN)  Pain Scale 0-10  Pain Score 9  Pain Location Abdomen  Pain Orientation Mid  Pain Onset On-going  Patients Stated Pain Goal 0  Pain Intervention(s) Medication (See eMAR) (oxy given)  Multiple Pain Sites No  Escalate  MEWS: Escalate Red: discuss with charge nurse/RN and provider, consider discussing with RRT  Notify: Charge Nurse/RN  Name of Charge Nurse/RN Notified Linard Millers  Date Charge Nurse/RN Notified 10/05/20  Time Charge Nurse/RN Notified 0950  Document  Patient Outcome Other (Comment) (Increased to 2 amio and card gtt)  Progress note created (see row info) Yes

## 2020-10-05 NOTE — Progress Notes (Addendum)
Cardizem gtt at 15  Amio at 16.67 NS at 293ml/hr  Suprapubic cath flushed per MD Danford request. 41ml urine emptied at this time.

## 2020-10-05 NOTE — Progress Notes (Signed)
HR in 80s and BP 80s/60s MD Danford made aware via phone call. Verbal order to DC cardizem gtt at this time. Fluids started at 176mL/hr as well per verbal order from MD [place]. Request from MD for nightshift RN to call him at [facility] with update on patient. RN Jake Samples made aware by this RN.

## 2020-10-05 NOTE — Progress Notes (Signed)
[facility] PROGRESS NOTE    Gerald Odonnell  SVX:[id number] DOB: 01/06/1956 DOA: 10/03/2020 PCP: Jonetta Osgood, NP      Brief Narrative:  Gerald Odonnell is a 65 y.o. M with metastatic prostate cancer on [place], [place] catheter, recurrent UTI, and HTN who presented with decreased mentation, fever.  Evidently the patient fell the night before admission, was unable to get up all night and lay on the floor until he was found the next day and brought to the ER. In the ER, he was confused, febrile, tachycardic, and tachypneic greater than 22.  Chest x-ray clear, CT angiogram unremarkable, urinalysis showed pyuria.  SP catheter exchanged, started on IV antibiotics and admitted.           Assessment & Plan:  Severe sepsis With endorgan dysfunction, due to SP catheter, present on admission.  Presented with somnolence/confusion, qSOFA greater than 2, thrombocytopenia.  SP catheter was changed on admission  Cultures no growth to date - Continue Rocephin - Hold oxybutynin   Acute metabolic encephalopathy due to sepsis Presented with somnolence, at baseline he is alert and interactive.  He is now back to being alert and oriented.  Prostate cancer - Continue prednisone and abiraterone  Atrial fibrillation with RVR Patient's tachycardia responded to nodal blocking agents in the ER, but overnight he went back into RVR and has remained that way - Given IV fluids now - Continue IV amiodarone infusion - Continue IV diltiazem infusion, titrate up to maximum - Add metoprolol -Keep magnesium greater than 2, potassium greater than 4  Hypokalemia and hypomagnesemia Magnesium normal - Supplement potassium again  Thrombocytopenia Due to sepsis    Essential hypertension -Hold furosemide     Disposition: Status is: Inpatient  Remains inpatient appropriate because:Hemodynamically unstable  Dispo: The patient is from: Home              Anticipated d/c is to: SNF               Patient currently is not medically stable to d/c.   Difficult to place patient No       Level of care: Progressive Cardiac       MDM: The below labs and imaging reports were reviewed and summarized above.  Medication management as above.This is a severe acute illness with threat to life  DVT prophylaxis: Lovneox  Code Status: FULL Family Communication: Sister at the bedside             Subjective: Patient feels malaise, shortness of breath, chest discomfort, palpitations, nausea.  Objective: Vitals:   10/05/20 0946 10/05/20 1049 10/05/20 1236 10/05/20 1435  BP:  (!) 127/105    Pulse:  (!) 148    Resp: (!) 30 (!) 33    Temp:   98.7 F (37.1 C) 98.8 F (37.1 C)  TempSrc:   Oral Oral  SpO2:  97%    Weight:      Height:        Intake/Output Summary (Last 24 hours) at 10/05/2020 1526 Last data filed at 10/05/2020 1455 Gross per 24 hour  Intake 2105.11 ml  Output --  Net 2105.11 ml   Filed Weights   10/03/20 0759  Weight: 120.2 kg    Examination: General appearance: Obese adult male, awake, interactive, appears uncomfortable     HEENT: Anicteric, conjunctival pink, lids and lashes normal.  No nasal deformity, discharge, or epistaxis, oropharynx tacky dry, lips dry, edentulous Skin: No suspicious rashes or lesions, cap refill  normal, no Cardiac: Tachycardic, irregular, no murmurs, JVP not visible, no lower extremity edema Respiratory:, Lungs clear without rales or wheezes Abdomen: Abdomen soft, tenderness palpation in the pubic area, no rigidity or rebound, no ascites or distention, no guarding MSK: No deformities or effusions of the large joints of the upper or lower extremities bilaterally Neuro: Awake, oriented to self, situation, extraocular movements intact, moves all extremities with generalized weakness but symmetric strength, speech fluent Psych: Attention normal, affect blunted, judgment insight appear normal     Data Reviewed:  I have personally reviewed following labs and imaging studies:  CBC: Recent Labs  Lab 10/03/20 0805 10/04/20 0624 10/05/20 0519  WBC 11.5* 8.9 6.5  NEUTROABS 10.1*  --   --   HGB 13.1 12.2* 10.9*  HCT 36.0* 34.7* 31.8*  MCV 106.8* 111.6* 112.4*  PLT 117* 100* 87*   Basic Metabolic Panel: Recent Labs  Lab 10/03/20 0805 10/03/20 1010 10/04/20 0624 10/05/20 0519  NA 132*  --  136 135  K 3.3*  --  4.3 3.6  CL 100  --  107 108  CO2 23  --  23 19*  GLUCOSE 118*  --  95 76  BUN 10  --  10 11  CREATININE 0.63  --  0.68 0.56*  CALCIUM 8.9  --  8.2* 8.0*  MG  --  1.6* 2.2  --    GFR: Estimated Creatinine Clearance: 121.3 mL/min (A) (by C-G formula based on SCr of 0.56 mg/dL (L)). Liver Function Tests: Recent Labs  Lab 10/03/20 0805  AST 53*  ALT 25  ALKPHOS 136*  BILITOT 3.8*  PROT 6.6  ALBUMIN 3.0*   Recent Labs  Lab 10/03/20 0805  LIPASE 18   No results for input(s): AMMONIA in the last 168 hours. Coagulation Profile: Recent Labs  Lab 10/04/20 0624  INR 1.5*   Cardiac Enzymes: Recent Labs  Lab 10/03/20 0805  CKTOTAL 201   BNP (last 3 results) No results for input(s): PROBNP in the last 8760 hours. HbA1C: No results for input(s): HGBA1C in the last 72 hours. CBG: No results for input(s): GLUCAP in the last 168 hours. Lipid Profile: No results for input(s): CHOL, HDL, LDLCALC, TRIG, CHOLHDL, LDLDIRECT in the last 72 hours. Thyroid Function Tests: No results for input(s): TSH, T4TOTAL, FREET4, T3FREE, THYROIDAB in the last 72 hours. Anemia Panel: No results for input(s): VITAMINB12, FOLATE, FERRITIN, TIBC, IRON, RETICCTPCT in the last 72 hours. Urine analysis:    Component Value Date/Time   COLORURINE AMBER (A) 10/03/2020 1100   APPEARANCEUR HAZY (A) 10/03/2020 1100   APPEARANCEUR Clear 01/17/2020 0912   LABSPEC 1.015 10/03/2020 1100   LABSPEC 1.002 04/15/2014 1852   PHURINE 7.0 10/03/2020 1100   GLUCOSEU NEGATIVE 10/03/2020 1100   GLUCOSEU  Negative 04/15/2014 1852   HGBUR SMALL (A) 10/03/2020 1100   BILIRUBINUR NEGATIVE 10/03/2020 1100   BILIRUBINUR neg 03/18/2020 1456   BILIRUBINUR Negative 01/17/2020 0912   BILIRUBINUR Negative 04/15/2014 1852   KETONESUR 5 (A) 10/03/2020 1100   PROTEINUR 30 (A) 10/03/2020 1100   UROBILINOGEN 0.2 03/18/2020 1456   NITRITE POSITIVE (A) 10/03/2020 1100   LEUKOCYTESUR MODERATE (A) 10/03/2020 1100   LEUKOCYTESUR Negative 04/15/2014 1852   Sepsis Labs: @LABRCNTIP (procalcitonin:4,lacticacidven:4)  ) Recent Results (from the past 240 hour(s))  Resp Panel by RT-PCR (Flu A&B, Covid) Nasopharyngeal Swab     Status: None   Collection Time: 10/03/20  8:05 AM   Specimen: Nasopharyngeal Swab; Nasopharyngeal(NP) swabs in vial transport medium  Result Value Ref Range Status   SARS Coronavirus 2 by RT PCR NEGATIVE NEGATIVE Final    Comment: (NOTE) SARS-CoV-2 target nucleic acids are NOT DETECTED.  The SARS-CoV-2 RNA is generally detectable in upper respiratory specimens during the acute phase of infection. The lowest concentration of SARS-CoV-2 viral copies this assay can detect is 138 copies/mL. A negative result does not preclude SARS-Cov-2 infection and should not be used as the sole basis for treatment or other patient management decisions. A negative result may occur with  improper specimen collection/handling, submission of specimen other than nasopharyngeal swab, presence of viral mutation(s) within the areas targeted by this assay, and inadequate number of viral copies(<138 copies/mL). A negative result must be combined with clinical observations, patient history, and epidemiological information. The expected result is Negative.  Fact Sheet for Patients:  EntrepreneurPulse.com.au  Fact Sheet for Healthcare Providers:  IncredibleEmployment.be  This test is no t yet approved or cleared by the [place] FDA and  has been authorized for  detection and/or diagnosis of SARS-CoV-2 by FDA under an Emergency Use Authorization (EUA). This EUA will remain  in effect (meaning this test can be used) for the duration of the COVID-19 declaration under Section 564(b)(1) of the Act, 21 U.S.C.section 360bbb-3(b)(1), unless the authorization is terminated  or revoked sooner.       Influenza A by PCR NEGATIVE NEGATIVE Final   Influenza B by PCR NEGATIVE NEGATIVE Final    Comment: (NOTE) The Xpert Xpress SARS-CoV-2/FLU/RSV plus assay is intended as an aid in the diagnosis of influenza from Nasopharyngeal swab specimens and should not be used as a sole basis for treatment. Nasal washings and aspirates are unacceptable for Xpert Xpress SARS-CoV-2/FLU/RSV testing.  Fact Sheet for Patients: EntrepreneurPulse.com.au  Fact Sheet for Healthcare Providers: IncredibleEmployment.be  This test is not yet approved or cleared by the [place] FDA and has been authorized for detection and/or diagnosis of SARS-CoV-2 by FDA under an Emergency Use Authorization (EUA). This EUA will remain in effect (meaning this test can be used) for the duration of the COVID-19 declaration under Section 564(b)(1) of the Act, 21 U.S.C. section 360bbb-3(b)(1), unless the authorization is terminated or revoked.  Performed at [facility], [address], [place], [place] [address]   Urine culture     Status: Abnormal   Collection Time: 10/03/20 11:31 AM   Specimen: Urine, Random  Result Value Ref Range Status   Specimen Description   Final    URINE, RANDOM Performed at [facility], [address], [place], [place] [address]    Special Requests   Final    NONE Performed at [facility], [address] [place], [place] [address]    Culture MULTIPLE SPECIES PRESENT, SUGGEST RECOLLECTION (A)  Final   Report Status 10/04/2020 FINAL  Final  CULTURE, BLOOD (ROUTINE X 2) w Reflex to ID Panel      Status: None (Preliminary result)   Collection Time: 10/04/20  9:08 AM   Specimen: BLOOD RIGHT HAND  Result Value Ref Range Status   Specimen Description BLOOD RIGHT HAND  Final   Special Requests   Final    BOTTLES DRAWN AEROBIC AND ANAEROBIC Blood Culture adequate volume   Culture   Final    NO GROWTH < 24 HOURS Performed at [facility], [address] [place], [place] [address]    Report Status PENDING  Incomplete  CULTURE, BLOOD (ROUTINE X 2) w Reflex to ID Panel  Status: None (Preliminary result)   Collection Time: 10/04/20  9:08 AM   Specimen: BLOOD  Result Value Ref Range Status   Specimen Description BLOOD BLOOD LEFT WRIST  Final   Special Requests   Final    BOTTLES DRAWN AEROBIC AND ANAEROBIC Blood Culture adequate volume   Culture   Final    NO GROWTH < 24 HOURS Performed at [facility], [address], [place],  [address]    Report Status PENDING  Incomplete         Radiology Studies: DG Chest Port 1 View  Result Date: 10/04/2020 CLINICAL DATA:  Chest pain EXAM: PORTABLE CHEST 1 VIEW COMPARISON:  Radiograph 10/03/2020, CT 10/03/2020 FINDINGS: Unchanged cardiomediastinal silhouette. Unchanged elevated left hemidiaphragm with left basilar atelectasis. There is also right basilar atelectasis. There is no new focal airspace disease. No large pleural effusion or visible pneumothorax. Diffuse osseous sclerotic lesions are again seen. IMPRESSION: No new focal airspace disease. Unchanged elevated left hemidiaphragm and basilar subsegmental atelectasis. Diffuse sclerotic osseous metastatic disease again seen. Electronically Signed   By: Maurine Simmering   On: 10/04/2020 10:40        Scheduled Meds:  abiraterone acetate  1,000 mg Oral Daily   enoxaparin (LOVENOX) injection  60 mg Subcutaneous Q24H   metoprolol tartrate  25 mg Oral BID   predniSONE  5 mg Oral Q breakfast   Continuous Infusions:  amiodarone 30 mg/hr (10/05/20 1455)    cefTRIAXone (ROCEPHIN)  IV 1 g (10/05/20 1153)   diltiazem (CARDIZEM) infusion 15 mg/hr (10/05/20 1455)   sodium chloride       LOS: 2 days    Time spent: 35  minutes    Edwin Dada, MD [facility] 10/05/2020, 3:26 PM     Please page though [place] or Epic secure chat:  For [employer], Adult nurse

## 2020-10-05 NOTE — Progress Notes (Signed)
Cross coverage note  Patient on amiodarone infusion, with heart rate in the 160s to 170s, improving only to the 150s following Cardizem bolus at about 5:30 AM.  Cardizem infusion 5 mg/h ordered at this time.  Patient otherwise hemodynamically stable. Continue close monitoring

## 2020-10-05 NOTE — Progress Notes (Signed)
BP 70s/60s MP Danford made aware and spoke to this RN over phone to change Cardizem gtt down to 5mg /hr and run remaining NS as rapid bolus.  Will recheck BP at [place] and return call to MD Danford at that time to update on patient's status.  Amio gtt remains at 16.61ml/hr at this time.  Patient Aox4 with no additional needs at this time.

## 2020-10-05 NOTE — Progress Notes (Signed)
Cardizem gtt titrated to 10mg /hr due to HR remaining >150 sustaining. RN Leota Jacobsen at bedside when this RN titrated cardizem. BP 129/110 at time of titraton

## 2020-10-06 ENCOUNTER — Inpatient Hospital Stay: Payer: Medicare HMO

## 2020-10-06 ENCOUNTER — Inpatient Hospital Stay
Admit: 2020-10-06 | Discharge: 2020-10-06 | Disposition: A | Discharge status: Home / Self Care | Payer: Medicare HMO | Attending: Family Medicine | Admitting: Family Medicine

## 2020-10-06 LAB — COMPREHENSIVE METABOLIC PANEL
ALT: 22 U/L (ref 0–44)
AST: 39 U/L (ref 15–41)
Albumin: 2.5 g/dL — ABNORMAL LOW (ref 3.5–5.0)
Alkaline Phosphatase: 77 U/L (ref 38–126)
Anion gap: 4 — ABNORMAL LOW (ref 5–15)
BUN: 14 mg/dL (ref 8–23)
CO2: 19 mmol/L — ABNORMAL LOW (ref 22–32)
Calcium: 8.4 mg/dL — ABNORMAL LOW (ref 8.9–10.3)
Chloride: 115 mmol/L — ABNORMAL HIGH (ref 98–111)
Creatinine, Ser: 0.51 mg/dL — ABNORMAL LOW (ref 0.61–1.24)
GFR, Estimated: >60 mL/min (ref >60)
Glucose, Bld: 67 mg/dL — ABNORMAL LOW (ref 70–99)
Potassium: 3.6 mmol/L (ref 3.5–5.1)
Sodium: 138 mmol/L (ref 135–145)
Total Bilirubin: 1.6 mg/dL — ABNORMAL HIGH (ref 0.3–1.2)
Total Protein: 5.6 g/dL — ABNORMAL LOW (ref 6.5–8.1)

## 2020-10-06 LAB — CBC
HCT: 34.3 % — ABNORMAL LOW (ref 39.0–52.0)
Hemoglobin: 11.8 g/dL — ABNORMAL LOW (ref 13.0–17.0)
MCH: 38.4 pg — ABNORMAL HIGH (ref 26.0–34.0)
MCHC: 34.4 g/dL (ref 30.0–36.0)
MCV: 111.7 fL — ABNORMAL HIGH (ref 80.0–100.0)
Platelets: 102 10*3/uL — ABNORMAL LOW (ref 150–400)
RBC: 3.07 MIL/uL — ABNORMAL LOW (ref 4.22–5.81)
RDW: 13.2 % (ref 11.5–15.5)
WBC: 7 10*3/uL (ref 4.0–10.5)
nRBC: 0 % (ref 0.0–0.2)

## 2020-10-06 LAB — MAGNESIUM: Magnesium: 2.3 mg/dL (ref 1.7–2.4)

## 2020-10-06 MED ORDER — DILTIAZEM HCL-DEXTROSE 125-5 MG/125ML-% IV SOLN (PREMIX)
5.0000 mg/h | INTRAVENOUS | Status: DC
  Administered 2020-10-06: 10 mg/h via INTRAVENOUS
  Administered 2020-10-06: 5 mg/h via INTRAVENOUS
  Administered 2020-10-06: 10 mg/h via INTRAVENOUS
  Filled 2020-10-06 (×2): qty 125

## 2020-10-06 MED ORDER — MORPHINE SULFATE (PF) 2 MG/ML IV SOLN
2.0000 mg | Freq: Once | INTRAVENOUS | Status: AC | PRN
  Administered 2020-10-06: 2 mg via INTRAVENOUS
  Filled 2020-10-06: qty 1

## 2020-10-06 MED ORDER — OXYCODONE HCL 5 MG PO TABS
5.0000 mg | ORAL_TABLET | ORAL | Status: DC | PRN
  Administered 2020-10-06 – 2020-10-12 (×25): 5 mg via ORAL
  Filled 2020-10-06 (×25): qty 1

## 2020-10-06 MED ORDER — BLISTEX MEDICATED EX OINT
TOPICAL_OINTMENT | CUTANEOUS | Status: DC | PRN
  Filled 2020-10-06 (×2): qty 6.3

## 2020-10-06 MED ORDER — METOPROLOL TARTRATE 5 MG/5ML IV SOLN
5.0000 mg | Freq: Once | INTRAVENOUS | Status: AC
  Administered 2020-10-06: 5 mg via INTRAVENOUS
  Filled 2020-10-06: qty 5

## 2020-10-06 MED ORDER — IOHEXOL 300 MG/ML  SOLN
100.0000 mL | Freq: Once | INTRAMUSCULAR | Status: AC | PRN
  Administered 2020-10-06: 100 mL via INTRAVENOUS

## 2020-10-06 MED ORDER — POTASSIUM CHLORIDE CRYS ER 20 MEQ PO TBCR
40.0000 meq | EXTENDED_RELEASE_TABLET | Freq: Two times a day (BID) | ORAL | Status: AC
  Administered 2020-10-06 (×2): 40 meq via ORAL
  Filled 2020-10-06 (×2): qty 2

## 2020-10-06 NOTE — Progress Notes (Signed)
[facility] PROGRESS NOTE    Gerald Odonnell  JTT:[id number] DOB: 11/17/1955 DOA: 10/03/2020 PCP: Jonetta Osgood, NP      Brief Narrative:  [name] is a 65 y.o. M with metastatic prostate cancer on [place], [place] catheter, recurrent UTI, and HTN who presented with decreased mentation, fever.  Evidently the patient fell the night before admission, was unable to get up all night and lay on the floor until he was found the next day and brought to the ER. In the ER, he was confused, febrile, tachycardic, and tachypneic greater than 22.  Chest x-ray clear, CT angiogram unremarkable, urinalysis showed pyuria.  SP catheter exchanged, started on IV antibiotics and admitted.           Assessment & Plan:  Severe sepsis With endorgan dysfunction, due to SP catheter, present on admission.  Presented with somnolence/confusion, qSOFA greater than 2, thrombocytopenia.  SP catheter was changed on admission  Cultures no growth to date - Continue Rocephin - Obtain CT abdomen and pelvis to rule out abscess - Hold oxybutynin   Acute metabolic encephalopathy due to sepsis Presented with somnolence, at baseline he is alert and interactive.  He is now back to being alert and oriented.  Prostate cancer - Continue prednisone and abiraterone  Atrial fibrillation with RVR Patient with persistent tachycardia - Continue IV fluids - Continue IV amiodarone - Continue IV diltiazem - Keep magnesium greater than 2, potassium greater than 4   Hypokalemia and hypomagnesemia Magnesium normal - Repeat potassium supplement  Thrombocytopenia Due to sepsis and Lovenox, improving    Hypertension - Hold furosemide      Disposition: Status is: Inpatient  Remains inpatient appropriate because:Hemodynamically unstable  Dispo: The patient is from: Home              Anticipated d/c is to: SNF              Patient currently is not medically stable to d/c.   Difficult to  place patient No       Level of care: Progressive Cardiac       MDM: The below labs and imaging reports were reviewed and summarized above.  Medication management as above.This is a severe acute illness with threat to life  DVT prophylaxis: Lovneox  Code Status: FULL Family Communication: Daughter at the bedside             Subjective: He still having gross hematuria, severe abdominal pain, shortness of breath, and discomfort   Objective: Vitals:   10/06/20 0300 10/06/20 0751 10/06/20 1222 10/06/20 1500  BP: 109/81 (!) 125/104  108/79  Pulse: (!) 110 (!) 140    Resp: 17 20  (!) 21  Temp:  98.7 F (37.1 C) 98 F (36.7 C) 98.1 F (36.7 C)  TempSrc:  Oral Oral   SpO2: 97% 98%    Weight:      Height:        Intake/Output Summary (Last 24 hours) at 10/06/2020 1718 Last data filed at 10/06/2020 1542 Gross per 24 hour  Intake 2548.27 ml  Output 800 ml  Net 1748.27 ml   Filed Weights   10/03/20 0759  Weight: 120.2 kg    Examination: General appearance: Obese adult male, awake, interactive, appears uncomfortable     HEENT: Anicteric, conjunctival pink, lids and lashes normal.  No nasal deformity, discharge, or epistaxis, oropharynx dry, lips dry, edentulous Skin: No suspicious rashes or lesions, cap refill normal, no Cardiac: Tachycardic, irregular,  no murmurs, JVP not visible, no lower extremity edema Respiratory:, Lungs clear without rales or wheezes Abdomen: Abdomen soft, tenderness palpation in the pubic area, no rigidity or rebound, no ascites or distention, no guarding MSK: No deformities or effusions of the large joints of the upper or lower extremities bilaterally Neuro: Awake, oriented to self, situation, extraocular movements intact, moves all extremities with generalized weakness but symmetric strength, speech fluent Psych: Attention normal, affect blunted, judgment insight appear normal     Data Reviewed: I have personally reviewed  following labs and imaging studies:  CBC: Recent Labs  Lab 10/03/20 0805 10/04/20 0624 10/05/20 0519 10/06/20 0450  WBC 11.5* 8.9 6.5 7.0  NEUTROABS 10.1*  --   --   --   HGB 13.1 12.2* 10.9* 11.8*  HCT 36.0* 34.7* 31.8* 34.3*  MCV 106.8* 111.6* 112.4* 111.7*  PLT 117* 100* 87* 580*   Basic Metabolic Panel: Recent Labs  Lab 10/03/20 0805 10/03/20 1010 10/04/20 0624 10/05/20 0519 10/06/20 0450  NA 132*  --  136 135 138  K 3.3*  --  4.3 3.6 3.6  CL 100  --  107 108 115*  CO2 23  --  23 19* 19*  GLUCOSE 118*  --  95 76 67*  BUN 10  --  10 11 14   CREATININE 0.63  --  0.68 0.56* 0.51*  CALCIUM 8.9  --  8.2* 8.0* 8.4*  MG  --  1.6* 2.2  --  2.3   GFR: Estimated Creatinine Clearance: 121.3 mL/min (A) (by C-G formula based on SCr of 0.51 mg/dL (L)). Liver Function Tests: Recent Labs  Lab 10/03/20 0805 10/06/20 0450  AST 53* 39  ALT 25 22  ALKPHOS 136* 77  BILITOT 3.8* 1.6*  PROT 6.6 5.6*  ALBUMIN 3.0* 2.5*   Recent Labs  Lab 10/03/20 0805  LIPASE 18   No results for input(s): AMMONIA in the last 168 hours. Coagulation Profile: Recent Labs  Lab 10/04/20 0624  INR 1.5*   Cardiac Enzymes: Recent Labs  Lab 10/03/20 0805  CKTOTAL 201   BNP (last 3 results) No results for input(s): PROBNP in the last 8760 hours. HbA1C: No results for input(s): HGBA1C in the last 72 hours. CBG: No results for input(s): GLUCAP in the last 168 hours. Lipid Profile: No results for input(s): CHOL, HDL, LDLCALC, TRIG, CHOLHDL, LDLDIRECT in the last 72 hours. Thyroid Function Tests: No results for input(s): TSH, T4TOTAL, FREET4, T3FREE, THYROIDAB in the last 72 hours. Anemia Panel: No results for input(s): VITAMINB12, FOLATE, FERRITIN, TIBC, IRON, RETICCTPCT in the last 72 hours. Urine analysis:    Component Value Date/Time   COLORURINE AMBER (A) 10/03/2020 1100   APPEARANCEUR HAZY (A) 10/03/2020 1100   APPEARANCEUR Clear 01/17/2020 0912   LABSPEC 1.015 10/03/2020 1100    LABSPEC 1.002 04/15/2014 1852   PHURINE 7.0 10/03/2020 1100   GLUCOSEU NEGATIVE 10/03/2020 1100   GLUCOSEU Negative 04/15/2014 1852   HGBUR SMALL (A) 10/03/2020 1100   BILIRUBINUR NEGATIVE 10/03/2020 1100   BILIRUBINUR neg 03/18/2020 1456   BILIRUBINUR Negative 01/17/2020 0912   BILIRUBINUR Negative 04/15/2014 1852   KETONESUR 5 (A) 10/03/2020 1100   PROTEINUR 30 (A) 10/03/2020 1100   UROBILINOGEN 0.2 03/18/2020 1456   NITRITE POSITIVE (A) 10/03/2020 1100   LEUKOCYTESUR MODERATE (A) 10/03/2020 1100   LEUKOCYTESUR Negative 04/15/2014 1852   Sepsis Labs: @LABRCNTIP (procalcitonin:4,lacticacidven:4)  ) Recent Results (from the past 240 hour(s))  Resp Panel by RT-PCR (Flu A&B, Covid) Nasopharyngeal Swab  Status: None   Collection Time: 10/03/20  8:05 AM   Specimen: Nasopharyngeal Swab; Nasopharyngeal(NP) swabs in vial transport medium  Result Value Ref Range Status   SARS Coronavirus 2 by RT PCR NEGATIVE NEGATIVE Final    Comment: (NOTE) SARS-CoV-2 target nucleic acids are NOT DETECTED.  The SARS-CoV-2 RNA is generally detectable in upper respiratory specimens during the acute phase of infection. The lowest concentration of SARS-CoV-2 viral copies this assay can detect is 138 copies/mL. A negative result does not preclude SARS-Cov-2 infection and should not be used as the sole basis for treatment or other patient management decisions. A negative result may occur with  improper specimen collection/handling, submission of specimen other than nasopharyngeal swab, presence of viral mutation(s) within the areas targeted by this assay, and inadequate number of viral copies(<138 copies/mL). A negative result must be combined with clinical observations, patient history, and epidemiological information. The expected result is Negative.  Fact Sheet for Patients:  EntrepreneurPulse.com.au  Fact Sheet for Healthcare Providers:   IncredibleEmployment.be  This test is no t yet approved or cleared by the [place] FDA and  has been authorized for detection and/or diagnosis of SARS-CoV-2 by FDA under an Emergency Use Authorization (EUA). This EUA will remain  in effect (meaning this test can be used) for the duration of the COVID-19 declaration under Section 564(b)(1) of the Act, 21 U.S.C.section 360bbb-3(b)(1), unless the authorization is terminated  or revoked sooner.       Influenza A by PCR NEGATIVE NEGATIVE Final   Influenza B by PCR NEGATIVE NEGATIVE Final    Comment: (NOTE) The Xpert Xpress SARS-CoV-2/FLU/RSV plus assay is intended as an aid in the diagnosis of influenza from Nasopharyngeal swab specimens and should not be used as a sole basis for treatment. Nasal washings and aspirates are unacceptable for Xpert Xpress SARS-CoV-2/FLU/RSV testing.  Fact Sheet for Patients: EntrepreneurPulse.com.au  Fact Sheet for Healthcare Providers: IncredibleEmployment.be  This test is not yet approved or cleared by the [place] FDA and has been authorized for detection and/or diagnosis of SARS-CoV-2 by FDA under an Emergency Use Authorization (EUA). This EUA will remain in effect (meaning this test can be used) for the duration of the COVID-19 declaration under Section 564(b)(1) of the Act, 21 U.S.C. section 360bbb-3(b)(1), unless the authorization is terminated or revoked.  Performed at [facility], [address], [place], [place] [address]   Urine culture     Status: Abnormal   Collection Time: 10/03/20 11:31 AM   Specimen: Urine, Random  Result Value Ref Range Status   Specimen Description   Final    URINE, RANDOM Performed at [facility], [address], [place], [place] [address]    Special Requests   Final    NONE Performed at [facility], [address] [place], [place] [address]    Culture  MULTIPLE SPECIES PRESENT, SUGGEST RECOLLECTION (A)  Final   Report Status 10/04/2020 FINAL  Final  CULTURE, BLOOD (ROUTINE X 2) w Reflex to ID Panel     Status: None (Preliminary result)   Collection Time: 10/04/20  9:08 AM   Specimen: BLOOD RIGHT HAND  Result Value Ref Range Status   Specimen Description BLOOD RIGHT HAND  Final   Special Requests   Final    BOTTLES DRAWN AEROBIC AND ANAEROBIC Blood Culture adequate volume   Culture   Final    NO GROWTH < 24 HOURS Performed at [facility], [address] [place], [place] [address]  Report Status PENDING  Incomplete  CULTURE, BLOOD (ROUTINE X 2) w Reflex to ID Panel     Status: None (Preliminary result)   Collection Time: 10/04/20  9:08 AM   Specimen: BLOOD  Result Value Ref Range Status   Specimen Description BLOOD BLOOD LEFT WRIST  Final   Special Requests   Final    BOTTLES DRAWN AEROBIC AND ANAEROBIC Blood Culture adequate volume   Culture   Final    NO GROWTH < 24 HOURS Performed at [facility], [address] [place], [place] [address]    Report Status PENDING  Incomplete  Urine Culture     Status: Abnormal (Preliminary result)   Collection Time: 10/05/20  7:35 AM   Specimen: Urine, Random  Result Value Ref Range Status   Specimen Description   Final    URINE, RANDOM Performed at [facility], [address], [place], [place] [address]    Special Requests   Final    NONE Performed at [facility], [address] [place], [place] [address]    Culture (A)  Final    30,000 COLONIES/mL ENTEROCOCCUS FAECALIS 20,000 COLONIES/mL PSEUDOMONAS AERUGINOSA CULTURE REINCUBATED FOR BETTER GROWTH Performed at [facility], [place] [address] [place],  [address]    Report Status PENDING  Incomplete         Radiology Studies: No results found.      Scheduled Meds:  abiraterone acetate  1,000 mg Oral Daily   enoxaparin (LOVENOX) injection  60 mg Subcutaneous Q24H    metoprolol tartrate  25 mg Oral BID   potassium chloride  40 mEq Oral BID   predniSONE  5 mg Oral Q breakfast   Continuous Infusions:  sodium chloride 100 mL/hr at 10/06/20 1542   amiodarone 30 mg/hr (10/06/20 1542)   cefTRIAXone (ROCEPHIN)  IV 1 g (10/06/20 1245)   diltiazem (CARDIZEM) infusion 10 mg/hr (10/06/20 1542)     LOS: 3 days    Time spent: 35   minutes    Edwin Dada, MD [facility] 10/06/2020, 5:18 PM     Please page though [place] or Epic secure chat:  For [employer], Adult nurse

## 2020-10-06 NOTE — Progress Notes (Signed)
   10/06/20 0751  Assess: MEWS Score  Temp 98.7 F (37.1 C)  BP (!) 125/104  Pulse Rate (!) 140  Resp 20  SpO2 98 %  O2 Device Room Air  Assess: MEWS Score  MEWS Temp 0  MEWS Systolic 0  MEWS Pulse 3  MEWS RR 0  MEWS LOC 0  MEWS Score 3  MEWS Score Color Yellow  Assess: if the MEWS score is Yellow or Red  Were vital signs taken at a resting state? Yes  Focused Assessment No change from prior assessment  Early Detection of Sepsis Score *See Row Information* Low  MEWS guidelines implemented *See Row Information* No, previously yellow, continue vital signs every 4 hours  Treat  Pain Scale 0-10  Pain Score 8  Pain Type Chronic pain  Pain Location Abdomen  Pain Frequency Constant  Patients Stated Pain Goal 0  Pain Intervention(s) Medication (See eMAR);MD notified (Comment) (asked to increased freq)  Multiple Pain Sites No  Escalate  MEWS: Escalate Yellow: discuss with charge nurse/RN and consider discussing with provider and RRT  Notify: Charge Nurse/RN  Name of Charge Nurse/RN Notified Linard Millers  Date Charge Nurse/RN Notified 10/06/20  Time Charge Nurse/RN Notified [id number]  Document  Patient Outcome Other (Comment) (MD Danford aware and new orderws placed)  Progress note created (see row info) Yes

## 2020-10-06 NOTE — Progress Notes (Signed)
PT Cancellation Note  Patient Details Name: Gerald Odonnell MRN: [id number] DOB: 06-13-1955   Cancelled Treatment:    Reason Eval/Treat Not Completed: Medical issues which prohibited therapy Pt has had continued issues with vitals.  They had tried to ween cardiac meds but HR spiked up with the effort.  Unable to balance BP and HR, spoke with nurse who reports he is not appropriate/stable for PT at this time.  Will maintain on caseload and continue to follow as appropriate.   Kreg Shropshire, DPT 10/06/2020, 4:01 PM

## 2020-10-06 NOTE — Progress Notes (Signed)
Patient transported to CT. CT RN called to report to this RN that patient refused CT. Patient said it was too much work and he did not want to do it. MD Danford made aware via secure chat.   Patient placed on cardizem gtt in addition to amio gtt at 1048 on 10-06-20 for heart rate control. Patient's HR elevated into 150s at beginning of shift. MD made aware and new orders placed for increased pain control, card gtt and metoprolol IV push. Heart rate lowered and maintained 100-110s afternoon and evening.  Patient given ordered dose of Morphine prior to transport to CT. Continues to complain of pain.   Daughter Kenney Houseman updated by this RN and MD Danford at bedside throughout the day multiple times. Family educated to contact [place] for all patient updates.   Left AC leaking when flushed after Morphine administration. Malpositioned, repositioned without drainage when flushed. IV team consult placed and this RN spoke to IV RN about possible repositioning or need for additional PIV due to incompatibility of amio and card gtt. Awaiting new placement or update from IV RN at this time.

## 2020-10-07 ENCOUNTER — Other Ambulatory Visit (HOSPITAL_COMMUNITY): Payer: Self-pay

## 2020-10-07 LAB — CBC
HCT: 35.5 % — ABNORMAL LOW (ref 39.0–52.0)
Hemoglobin: 12.2 g/dL — ABNORMAL LOW (ref 13.0–17.0)
MCH: 38.2 pg — ABNORMAL HIGH (ref 26.0–34.0)
MCHC: 34.4 g/dL (ref 30.0–36.0)
MCV: 111.3 fL — ABNORMAL HIGH (ref 80.0–100.0)
Platelets: 122 10*3/uL — ABNORMAL LOW (ref 150–400)
RBC: 3.19 MIL/uL — ABNORMAL LOW (ref 4.22–5.81)
RDW: 13.5 % (ref 11.5–15.5)
WBC: 8.1 10*3/uL (ref 4.0–10.5)
nRBC: 0 % (ref 0.0–0.2)

## 2020-10-07 LAB — BASIC METABOLIC PANEL
Anion gap: 4 — ABNORMAL LOW (ref 5–15)
BUN: 14 mg/dL (ref 8–23)
CO2: 21 mmol/L — ABNORMAL LOW (ref 22–32)
Calcium: 8.3 mg/dL — ABNORMAL LOW (ref 8.9–10.3)
Chloride: 112 mmol/L — ABNORMAL HIGH (ref 98–111)
Creatinine, Ser: 0.51 mg/dL — ABNORMAL LOW (ref 0.61–1.24)
GFR, Estimated: >60 mL/min (ref >60)
Glucose, Bld: 62 mg/dL — ABNORMAL LOW (ref 70–99)
Potassium: 3.9 mmol/L (ref 3.5–5.1)
Sodium: 137 mmol/L (ref 135–145)

## 2020-10-07 LAB — ECHOCARDIOGRAM COMPLETE
AR max vel: 3.36 cm2
AV Peak grad: 6.7 mmHg
Ao pk vel: 1.29 m/s
Area-P 1/2: 4.93 cm2
Height: 70 in
S' Lateral: 2.55 cm
Weight: 4240 oz

## 2020-10-07 LAB — RETIC PANEL
Immature Retic Fract: 8.2 % (ref 2.3–15.9)
RBC.: 3.12 MIL/uL — ABNORMAL LOW (ref 4.22–5.81)
Retic Count, Absolute: 65.5 10*3/uL (ref 19.0–186.0)
Retic Ct Pct: 2.1 % (ref 0.4–3.1)
Reticulocyte Hemoglobin: 34.6 pg (ref >27.9)

## 2020-10-07 LAB — PATHOLOGIST SMEAR REVIEW

## 2020-10-07 LAB — VITAMIN B12: Vitamin B-12: 914 pg/mL (ref 180–914)

## 2020-10-07 MED ORDER — MORPHINE SULFATE (PF) 2 MG/ML IV SOLN
1.0000 mg | Freq: Once | INTRAVENOUS | Status: AC
  Administered 2020-10-07: 1 mg via INTRAVENOUS
  Filled 2020-10-07: qty 1

## 2020-10-07 MED ORDER — METHOCARBAMOL 500 MG PO TABS
500.0000 mg | ORAL_TABLET | Freq: Four times a day (QID) | ORAL | Status: DC | PRN
  Administered 2020-10-07 – 2020-10-14 (×15): 500 mg via ORAL
  Filled 2020-10-07 (×18): qty 1

## 2020-10-07 MED ORDER — PIPERACILLIN-TAZOBACTAM 3.375 G IVPB
3.3750 g | Freq: Three times a day (TID) | INTRAVENOUS | Status: DC
  Administered 2020-10-07 – 2020-10-09 (×7): 3.375 g via INTRAVENOUS
  Filled 2020-10-07 (×7): qty 50

## 2020-10-07 MED ORDER — DILTIAZEM HCL ER COATED BEADS 120 MG PO CP24
240.0000 mg | ORAL_CAPSULE | Freq: Every day | ORAL | Status: DC
  Administered 2020-10-07 – 2020-10-11 (×5): 240 mg via ORAL
  Filled 2020-10-07 (×6): qty 2

## 2020-10-07 MED ORDER — AMIODARONE HCL 200 MG PO TABS
400.0000 mg | ORAL_TABLET | Freq: Two times a day (BID) | ORAL | Status: DC
  Administered 2020-10-07 – 2020-10-08 (×2): 400 mg via ORAL
  Filled 2020-10-07 (×3): qty 2

## 2020-10-07 MED ORDER — APIXABAN 5 MG PO TABS
5.0000 mg | ORAL_TABLET | Freq: Two times a day (BID) | ORAL | Status: DC
  Administered 2020-10-07 – 2020-10-10 (×7): 5 mg via ORAL
  Filled 2020-10-07 (×7): qty 1

## 2020-10-07 NOTE — Discharge Instructions (Addendum)
-   Suprapubic catheter care per protocol  -Heart healthy diet with 1800 cc fluid restrictions.

## 2020-10-07 NOTE — Care Management Important Message (Signed)
Important Message  Patient Details  Name: Gerald Odonnell MRN: [id number] Date of Birth: 1955/06/03   Medicare Important Message Given:  N/A - LOS <3 / Initial given by admissions  Initial Medicare IM reviewed with Renee Rival, friend, by Coralie Common, Patient Access Associate on 10/06/2020 at 7:44am.    Dannette Barbara 10/07/2020, 9:22 AM

## 2020-10-07 NOTE — Progress Notes (Signed)
[facility] PROGRESS NOTE    Gerald Odonnell  XNA:[id number] DOB: [birth year] DOA: 10/03/2020 PCP: Gerald Osgood, NP      Brief Narrative:  [name] is a 65 y.o. M with metastatic prostate cancer on [place], [place] catheter, recurrent UTI, and HTN who presented with decreased mentation, fever.  Evidently the patient fell the night before admission, was unable to get up all night and lay on the floor until he was found the next day and brought to the ER. In the ER, he was confused, febrile, tachycardic, and tachypneic greater than 22.  Chest x-ray clear, CT angiogram unremarkable, urinalysis showed pyuria.  SP catheter exchanged, started on IV antibiotics and admitted.           Assessment & Plan:  Severe sepsis due to Enterococcus and Pseudomonas UTI due to SP catheter With endorgan dysfunction, due to SP catheter, present on admission.  Presented with somnolence/confusion, qSOFA greater than 2, thrombocytopenia.  SP catheter was changed on admission  Refused CT abdomen yesterday Cultures growing Enterococcus faecalis and Pseudomonas, which probably explains sluggish response to treatment so far, and continued abdominal pain - Stop Rocephin - Start Zosyn - Hold oxybutynin   Acute metabolic encephalopathy due to sepsis Presented with somnolence, at baseline he is alert and interactive.  He continues to be intermittently disoriented  Prostate cancer - Continue prednisone and abiraterone  Atrial fibrillation with RVR The patient has had difficult to control tachycardia for the last 3 days.  In the last 24 hours, his rate has been controlled with diltiazem, fluids  Afib is a new diagnosis - Stop fluids - Transition amiodarone diltiazem to oral form - Keep magnesium greater than 2, potassium greater than 4 -Will start Eliquis  Hypokalemia and hypomagnesemia Resolved with treatment  Thrombocytopenia Due to sepsis and Lovenox,  improving    Hypertension Blood pressure soft - We will need to restart furosemide soon      Disposition: Status is: Inpatient  Remains inpatient appropriate because:Hemodynamically unstable  Dispo: The patient is from: Home              Anticipated d/c is to: SNF              Patient currently is not medically stable to d/c.   Difficult to place patient No       Level of care: Progressive Cardiac       MDM: The below labs and imaging reports were reviewed and summarized above.  Medication management as above.This is a severe acute illness with threat to life  DVT prophylaxis: Eliquis  Code Status: FULL Family Communication: Daughter by phone             Subjective: Hematuria is resolved, urine still somewhat dark, abdominal pain is still present, controlled with oral pain medicine still somewhat short of breath, weak, confused at times, no vomiting, seizures, chest discomfort.   Objective: Vitals:   10/07/20 0726 10/07/20 0946 10/07/20 1118 10/07/20 1541  BP: 109/70 (!) 113/59 130/73 (!) 109/91  Pulse: 74  81 89  Resp: 14 (!) 21 16 14   Temp: 98.1 F (36.7 C)  98.2 F (36.8 C) 98.2 F (36.8 C)  TempSrc: Oral  Oral Oral  SpO2: 96%  95% 97%  Weight:      Height:        Intake/Output Summary (Last 24 hours) at 10/07/2020 1750 Last data filed at 10/07/2020 0940 Gross per 24 hour  Intake 1619.27 ml  Output  775 ml  Net 844.27 ml   Filed Weights   10/03/20 0759  Weight: 120.2 kg    Examination: General appearance: Obese adult male, awake, interactive, appears tired     HEENT: Anicteric, conjunctival pink, lids and lashes normal.  No nasal deformity, discharge, or epistaxis, oropharynx dry, lips normal, edentulous, no oral lesion Skin: Cap refill normal, no suspicious rashes or lesions Cardiac: Irregular, but rate controlled now, no murmurs, JVP not visible, starting to have lower extremity edema Respiratory: Tachypneic, No rales or  wheezing Abdomen: Abdomen with tenderness palpation the pubic area around his SP catheter, no ascites or distention, he has some MSK:  Neuro: Awake and alert, extraocular movements intact, moves all extremities with generalized weakness, speech fluent Psych: Attention normal, affect blunted, judgment insight appear impaired      Data Reviewed: I have personally reviewed following labs and imaging studies:  CBC: Recent Labs  Lab 10/03/20 0805 10/04/20 0624 10/05/20 0519 10/06/20 0450 10/07/20 0430  WBC 11.5* 8.9 6.5 7.0 8.1  NEUTROABS 10.1*  --   --   --   --   HGB 13.1 12.2* 10.9* 11.8* 12.2*  HCT 36.0* 34.7* 31.8* 34.3* 35.5*  MCV 106.8* 111.6* 112.4* 111.7* 111.3*  PLT 117* 100* 87* 102* 569*   Basic Metabolic Panel: Recent Labs  Lab 10/03/20 0805 10/03/20 1010 10/04/20 0624 10/05/20 0519 10/06/20 0450 10/07/20 0430  NA 132*  --  136 135 138 137  K 3.3*  --  4.3 3.6 3.6 3.9  CL 100  --  107 108 115* 112*  CO2 23  --  23 19* 19* 21*  GLUCOSE 118*  --  95 76 67* 62*  BUN 10  --  10 11 14 14   CREATININE 0.63  --  0.68 0.56* 0.51* 0.51*  CALCIUM 8.9  --  8.2* 8.0* 8.4* 8.3*  MG  --  1.6* 2.2  --  2.3  --    GFR: Estimated Creatinine Clearance: 121.3 mL/min (A) (by C-G formula based on SCr of 0.51 mg/dL (L)). Liver Function Tests: Recent Labs  Lab 10/03/20 0805 10/06/20 0450  AST 53* 39  ALT 25 22  ALKPHOS 136* 77  BILITOT 3.8* 1.6*  PROT 6.6 5.6*  ALBUMIN 3.0* 2.5*   Recent Labs  Lab 10/03/20 0805  LIPASE 18   No results for input(s): AMMONIA in the last 168 hours. Coagulation Profile: Recent Labs  Lab 10/04/20 0624  INR 1.5*   Cardiac Enzymes: Recent Labs  Lab 10/03/20 0805  CKTOTAL 201   BNP (last 3 results) No results for input(s): PROBNP in the last 8760 hours. HbA1C: No results for input(s): HGBA1C in the last 72 hours. CBG: No results for input(s): GLUCAP in the last 168 hours. Lipid Profile: No results for input(s): CHOL, HDL,  LDLCALC, TRIG, CHOLHDL, LDLDIRECT in the last 72 hours. Thyroid Function Tests: No results for input(s): TSH, T4TOTAL, FREET4, T3FREE, THYROIDAB in the last 72 hours. Anemia Panel: Recent Labs    10/07/20 0430 10/07/20 0806  VITAMINB12  --  914  RETICCTPCT 2.1  --    Urine analysis:    Component Value Date/Time   COLORURINE AMBER (A) 10/03/2020 1100   APPEARANCEUR HAZY (A) 10/03/2020 1100   APPEARANCEUR Clear 01/17/2020 0912   LABSPEC 1.015 10/03/2020 1100   LABSPEC 1.002 04/15/2014 1852   PHURINE 7.0 10/03/2020 1100   GLUCOSEU NEGATIVE 10/03/2020 1100   GLUCOSEU Negative 04/15/2014 1852   HGBUR SMALL (A) 10/03/2020 1100   BILIRUBINUR  NEGATIVE 10/03/2020 1100   BILIRUBINUR neg 03/18/2020 1456   BILIRUBINUR Negative 01/17/2020 0912   BILIRUBINUR Negative 04/15/2014 1852   KETONESUR 5 (A) 10/03/2020 1100   PROTEINUR 30 (A) 10/03/2020 1100   UROBILINOGEN 0.2 03/18/2020 1456   NITRITE POSITIVE (A) 10/03/2020 1100   LEUKOCYTESUR MODERATE (A) 10/03/2020 1100   LEUKOCYTESUR Negative 04/15/2014 1852   Sepsis Labs: @LABRCNTIP (procalcitonin:4,lacticacidven:4)  ) Recent Results (from the past 240 hour(s))  Resp Panel by RT-PCR (Flu A&B, Covid) Nasopharyngeal Swab     Status: None   Collection Time: 10/03/20  8:05 AM   Specimen: Nasopharyngeal Swab; Nasopharyngeal(NP) swabs in vial transport medium  Result Value Ref Range Status   SARS Coronavirus 2 by RT PCR NEGATIVE NEGATIVE Final    Comment: (NOTE) SARS-CoV-2 target nucleic acids are NOT DETECTED.  The SARS-CoV-2 RNA is generally detectable in upper respiratory specimens during the acute phase of infection. The lowest concentration of SARS-CoV-2 viral copies this assay can detect is 138 copies/mL. A negative result does not preclude SARS-Cov-2 infection and should not be used as the sole basis for treatment or other patient management decisions. A negative result may occur with  improper specimen collection/handling,  submission of specimen other than nasopharyngeal swab, presence of viral mutation(s) within the areas targeted by this assay, and inadequate number of viral copies(<138 copies/mL). A negative result must be combined with clinical observations, patient history, and epidemiological information. The expected result is Negative.  Fact Sheet for Patients:  EntrepreneurPulse.com.au  Fact Sheet for Healthcare Providers:  IncredibleEmployment.be  This test is no t yet approved or cleared by the [place] FDA and  has been authorized for detection and/or diagnosis of SARS-CoV-2 by FDA under an Emergency Use Authorization (EUA). This EUA will remain  in effect (meaning this test can be used) for the duration of the COVID-19 declaration under Section 564(b)(1) of the Act, 21 U.S.C.section 360bbb-3(b)(1), unless the authorization is terminated  or revoked sooner.       Influenza A by PCR NEGATIVE NEGATIVE Final   Influenza B by PCR NEGATIVE NEGATIVE Final    Comment: (NOTE) The Xpert Xpress SARS-CoV-2/FLU/RSV plus assay is intended as an aid in the diagnosis of influenza from Nasopharyngeal swab specimens and should not be used as a sole basis for treatment. Nasal washings and aspirates are unacceptable for Xpert Xpress SARS-CoV-2/FLU/RSV testing.  Fact Sheet for Patients: EntrepreneurPulse.com.au  Fact Sheet for Healthcare Providers: IncredibleEmployment.be  This test is not yet approved or cleared by the [place] FDA and has been authorized for detection and/or diagnosis of SARS-CoV-2 by FDA under an Emergency Use Authorization (EUA). This EUA will remain in effect (meaning this test can be used) for the duration of the COVID-19 declaration under Section 564(b)(1) of the Act, 21 U.S.C. section 360bbb-3(b)(1), unless the authorization is terminated or revoked.  Performed at [facility], [address], [place], [place] [address]   Urine culture     Status: Abnormal   Collection Time: 10/03/20 11:31 AM   Specimen: Urine, Random  Result Value Ref Range Status   Specimen Description   Final    URINE, RANDOM Performed at [facility], [address], [place], [place] [address]    Special Requests   Final    NONE Performed at [facility], [address] [place], [place] [address]    Culture MULTIPLE SPECIES PRESENT, SUGGEST RECOLLECTION (A)  Final   Report Status 10/04/2020 FINAL  Final  CULTURE, BLOOD (ROUTINE  X 2) w Reflex to ID Panel     Status: None (Preliminary result)   Collection Time: 10/04/20  9:08 AM   Specimen: BLOOD RIGHT HAND  Result Value Ref Range Status   Specimen Description BLOOD RIGHT HAND  Final   Special Requests   Final    BOTTLES DRAWN AEROBIC AND ANAEROBIC Blood Culture adequate volume   Culture   Final    NO GROWTH 3 DAYS Performed at [facility], [address], [place], [place] [address]    Report Status PENDING  Incomplete  CULTURE, BLOOD (ROUTINE X 2) w Reflex to ID Panel     Status: None (Preliminary result)   Collection Time: 10/04/20  9:08 AM   Specimen: BLOOD  Result Value Ref Range Status   Specimen Description BLOOD BLOOD LEFT WRIST  Final   Special Requests   Final    BOTTLES DRAWN AEROBIC AND ANAEROBIC Blood Culture adequate volume   Culture   Final    NO GROWTH 3 DAYS Performed at [facility], [address] [place], [place] [address]    Report Status PENDING  Incomplete  Urine Culture     Status: Abnormal (Preliminary result)   Collection Time: 10/05/20  7:35 AM   Specimen: Urine, Random  Result Value Ref Range Status   Specimen Description   Final    URINE, RANDOM Performed at [facility], [address] [place], [place] [address]    Special Requests   Final    NONE Performed at [facility], [address] [place], [place] [address]    Culture (A)   Final    30,000 COLONIES/mL ENTEROCOCCUS FAECALIS 20,000 COLONIES/mL PSEUDOMONAS AERUGINOSA SUSCEPTIBILITIES TO FOLLOW Performed at [facility], [place] [address] [place], [place] [address]    Report Status PENDING  Incomplete         Radiology Studies: ECHOCARDIOGRAM COMPLETE  Result Date: 10/07/2020    ECHOCARDIOGRAM REPORT   Patient Name:   Gerald Odonnell Date of Exam: 10/06/2020 Medical Rec #:  [id number]       Height:       70.0 in Accession #:    [id number]      Weight:       265.0 lb Date of Birth:  1955-12-04       BSA:          2.353 m Patient Age:    83 years        BP:           109/81 mmHg Patient Gender: M               HR:           147 bpm. Exam Location:  [facility] Procedure: 2D Echo Indications:     Dyspnea R06.00  History:         Patient has no prior history of Echocardiogram examinations.  Sonographer:     Kathlen Brunswick RDCS Referring Phys:  [id number] Suann Larry Kendall Justo Diagnosing Phys: Serafina Royals MD  Sonographer Comments: Technically difficult study due to poor echo windows, patient is morbidly obese and suboptimal subcostal window. Image acquisition challenging due to patient body habitus. IMPRESSIONS  1. Left ventricular ejection fraction, by estimation, is 60 to 65%. The left ventricle has normal function. The left ventricle has no regional wall motion abnormalities. Left ventricular diastolic parameters were normal.  2. Right ventricular systolic function is normal. The right ventricular size is normal.  3.  The mitral valve is normal in structure. Trivial mitral valve regurgitation.  4. The aortic valve is normal in structure. Aortic valve regurgitation is not visualized. FINDINGS  Left Ventricle: Left ventricular ejection fraction, by estimation, is 60 to 65%. The left ventricle has normal function. The left ventricle has no regional wall motion abnormalities. The left ventricular internal cavity size was small. There is no left ventricular hypertrophy. Left  ventricular diastolic parameters were normal. Right Ventricle: The right ventricular size is normal. No increase in right ventricular wall thickness. Right ventricular systolic function is normal. Left Atrium: Left atrial size was normal in size. Right Atrium: Right atrial size was normal in size. Pericardium: There is no evidence of pericardial effusion. Mitral Valve: The mitral valve is normal in structure. Trivial mitral valve regurgitation. Tricuspid Valve: The tricuspid valve is normal in structure. Tricuspid valve regurgitation is trivial. Aortic Valve: The aortic valve is normal in structure. Aortic valve regurgitation is not visualized. Aortic valve peak gradient measures 6.7 mmHg. Pulmonic Valve: The pulmonic valve was normal in structure. Pulmonic valve regurgitation is not visualized. Aorta: The aortic root and ascending aorta are structurally normal, with no evidence of dilitation. IAS/Shunts: No atrial level shunt detected by color flow Doppler.  LEFT VENTRICLE PLAX 2D LVIDd:         3.59 cm  Diastology LVIDs:         2.55 cm  LV e' medial:    16.50 cm/s LV PW:         1.55 cm  LV E/e' medial:  6.2 LV IVS:        1.54 cm  LV e' lateral:   18.90 cm/s LVOT diam:     2.20 cm  LV E/e' lateral: 5.4 LV SV:         76 LV SV Index:   32 LVOT Area:     3.80 cm  LEFT ATRIUM            Index       RIGHT ATRIUM           Index LA diam:      4.60 cm  1.96 cm/m  RA Area:     20.40 cm LA Vol (A2C): 60.2 ml  25.59 ml/m RA Volume:   63.80 ml  27.12 ml/m LA Vol (A4C): 121.0 ml 51.43 ml/m  AORTIC VALVE AV Area (Vmax): 3.36 cm AV Vmax:        129.00 cm/s AV Peak Grad:   6.7 mmHg LVOT Vmax:      114.00 cm/s LVOT Vmean:     86.500 cm/s LVOT VTI:       0.200 m  AORTA Ao Root diam: 3.30 cm MITRAL VALVE MV Area (PHT): 4.93 cm     SHUNTS MV Decel Time: 154 msec     Systemic VTI:  0.20 m MV E velocity: 103.00 cm/s  Systemic Diam: 2.20 cm Serafina Royals MD Electronically signed by Serafina Royals MD Signature Date/Time:  10/07/2020/8:28:35 AM    Final         Scheduled Meds:  abiraterone acetate  1,000 mg Oral Daily   amiodarone  400 mg Oral BID   diltiazem  240 mg Oral Daily   enoxaparin (LOVENOX) injection  60 mg Subcutaneous Q24H   metoprolol tartrate  25 mg Oral BID   predniSONE  5 mg Oral Q breakfast   Continuous Infusions:  piperacillin-tazobactam (ZOSYN)  IV 3.375 g (10/07/20 1348)     LOS: 4 days  Time spent: 35 minutes    Edwin Dada, MD [facility] 10/07/2020, 5:50 PM     Please page though [employer] or Epic secure chat:  For [employer], Adult nurse

## 2020-10-07 NOTE — Consult Note (Signed)
Pharmacy Antibiotic Note  Armonte K Martinique is a 65 y.o. male w/ h/o metastatic prostate cancer (on xytiga), SP catheter, recurrent UTI's, & HTN admitted on 10/03/2020 for dec'd mentation and fever with c/f sepsis and UTI.  Pharmacy has been consulted for Zosyn dosing.  Plan: Received Ceftriaxone 1g Q24h (6/09-12) but per Ucx (PsA 20k CFU & E.Faecalis 30k CFUs -- both pending sensitivities);  will initiate Zosyn 3.375g IV q8h (4 hour infusion). (6/13>>  Height: 5\' 10"  (177.8 cm) Weight: 120.2 kg (265 lb) IBW/kg (Calculated) : 73  Temp (24hrs), Avg:98.1 F (36.7 C), Min:98 F (36.7 C), Max:98.1 F (36.7 C)  Recent Labs  Lab 10/03/20 0805 10/03/20 1326 10/03/20 1652 10/04/20 0624 10/04/20 0947 10/05/20 0519 10/05/20 1249 10/06/20 0450 10/07/20 0430  WBC 11.5*  --   --  8.9  --  6.5  --  7.0 8.1  CREATININE 0.63  --   --  0.68  --  0.56*  --  0.51* 0.51*  LATICACIDVEN  --  1.2 2.3*  --  2.0*  --  1.1  --   --     Estimated Creatinine Clearance: 121.3 mL/min (A) (by C-G formula based on SCr of 0.51 mg/dL (L)).    No Known Allergies  Antimicrobials this admission: CTX (6/09-12); ZOS 6/13 >>  Dose adjustments this admission: N/A  Microbiology results: 6/10 BCx: NG3D 6/11 UCx: PsA 20k CFU & E.Faecalis 30k CFUs -- both pending sensitivities  6/09 UCx: multi spp; req's recollection  6/09 Flu/Covid negative  Thank you for allowing pharmacy to be a part of this patient's care.  Shanon Brow Lariya Kinzie 10/07/2020 8:14 AM

## 2020-10-07 NOTE — Evaluation (Signed)
Physical Therapy Evaluation Patient Details Name: Gerald Odonnell MRN: [id number] DOB: 12/29/1955 Today's Date: 10/07/2020   History of Present Illness  presented to ER secondary to progressive weakness, fall in home environment; admitted for management of acute metabolic encephalopathy, sepsis related to UTI.  Clinical Impression  Patient supine in bed upon arrival to room; alert and oriented to self, location and general situation.  Follows commands, but demonstrates limited insight into deficits and overall functional limitations/implications.  Endorses significant pain in groin area (FACES 8/10); meds requested/received during session per primary RN.  Bilat UE/LE generally weak and deconditioned due to acute illness; generally edematous, but with notable increase in R UE edema (RN/MD informed/aware; feel it related to IV infiltrate). R UE positioned/elevated for edema management end of session.  Patient agreeable only to repositioning in bed due to level of pain, requiring mod/max assist for transition to modified long-sitting position; dep assist to place/prop pillows for comfort.  Per primary RN, patient required heavy assist for OOB to chair this AM; anticipate need for +2 with future mobility efforts.  Will continue to assess/progress as medically appropriate (and patient agreeable) in subsequent sessions. Would benefit from skilled PT to address above deficits and promote optimal return to PLOF.;recommend transition to STR upon discharge from acute hospitalization, as patient currently to demonstrate ability to safely return home alone/indep.     Follow Up Recommendations SNF    Equipment Recommendations       Recommendations for Other Services       Precautions / Restrictions Precautions Precautions: Fall Precaution Comments: suprapubic catheter      Mobility  Bed Mobility Overal bed mobility: Needs Assistance             General bed mobility comments: refused OOB  attempts.  Did participate with repositioning/weight shifting in bed, requiring mod/max assist for transition to modified long-sitting in bed    Transfers                 General transfer comment: refused OOB attempts due to pain.  Per RN, very heavy +2 for OOB to chair this AM  Ambulation/Gait             General Gait Details: refused OOB attempts due to pain  Stairs            Wheelchair Mobility    Modified Rankin (Stroke Patients Only)       Balance                                             Pertinent Vitals/Pain Pain Assessment: No/denies pain    Home Living Family/patient expects to be discharged to:: Private residence Living Arrangements: Alone Available Help at Discharge: Family;Available PRN/intermittently Type of Home: House Home Access: Stairs to enter Entrance Stairs-Rails: Right;Left;Can reach both Entrance Stairs-Number of Steps: 6 Home Layout: One level Home Equipment: None      Prior Function Level of Independence: Independent         Comments: Mod indep without assist device for ADLs, household mobilization; does endorse multiple falls in recent weeks     Hand Dominance        Extremity/Trunk Assessment   Upper Extremity Assessment Upper Extremity Assessment: Generalized weakness (grossly 4-/5 throughout; significant generalized edema throughout R UE (RN/MD informed aware-feel related to IV infiltrate))    Lower Extremity Assessment Lower Extremity  Assessment: Generalized weakness (grossly 3/5 throughout, generally edematous throughout)       Communication   Communication: No difficulties  Cognition Arousal/Alertness: Awake/alert Behavior During Therapy: WFL for tasks assessed/performed Overall Cognitive Status: No family/caregiver present to determine baseline cognitive functioning                                 General Comments: Oriented to self, location and general situation;  limited insight into deficits and inherent safety needs      General Comments      Exercises Other Exercises Other Exercises: Educated in role of PT and progressive mobility, importance of OOB activities to facilite discharge home.  Patient voiced understanding, but continued to refuse. Other Exercises: R UE elevated on pillows-patient educated on positioning-for edema management.   Assessment/Plan    PT Assessment Patient needs continued PT services  PT Problem List Decreased strength;Decreased range of motion;Decreased balance;Decreased activity tolerance;Decreased mobility;Decreased coordination;Decreased cognition;Decreased knowledge of use of DME;Decreased safety awareness;Decreased knowledge of precautions;Pain       PT Treatment Interventions DME instruction;Gait training;Functional mobility training;Balance training;Therapeutic exercise;Therapeutic activities;Cognitive remediation;Patient/family education    PT Goals (Current goals can be found in the Care Plan section)  Acute Rehab PT Goals Patient Stated Goal: to get some pain medicine PT Goal Formulation: With patient Time For Goal Achievement: 10/21/20 Potential to Achieve Goals: Fair    Frequency Min 2X/week   Barriers to discharge        Co-evaluation               AM-PAC PT "6 Clicks" Mobility  Outcome Measure Help needed turning from your back to your side while in a flat bed without using bedrails?: A Lot Help needed moving from lying on your back to sitting on the side of a flat bed without using bedrails?: Total Help needed moving to and from a bed to a chair (including a wheelchair)?: Total Help needed standing up from a chair using your arms (e.g., wheelchair or bedside chair)?: Total Help needed to walk in hospital room?: Total Help needed climbing 3-5 steps with a railing? : Total 6 Click Score: 7    End of Session   Activity Tolerance: Patient limited by pain Patient left: in bed;with bed  alarm set;with call bell/phone within reach Nurse Communication: Mobility status PT Visit Diagnosis: Muscle weakness (generalized) (M62.81);Difficulty in walking, not elsewhere classified (R26.2)    Time: 4356-8616 PT Time Calculation (min) (ACUTE ONLY): 17 min   Charges:   PT Evaluation $PT Eval Moderate Complexity: 1 Mod         Delphine Sizemore H. Owens Shark, PT, DPT, NCS 10/07/20, 3:30 PM [phone]

## 2020-10-08 LAB — URINE CULTURE: Culture: 30000 — AB

## 2020-10-08 LAB — BASIC METABOLIC PANEL
Anion gap: 5 (ref 5–15)
BUN: 11 mg/dL (ref 8–23)
CO2: 26 mmol/L (ref 22–32)
Calcium: 8.7 mg/dL — ABNORMAL LOW (ref 8.9–10.3)
Chloride: 110 mmol/L (ref 98–111)
Creatinine, Ser: 0.61 mg/dL (ref 0.61–1.24)
GFR, Estimated: >60 mL/min (ref >60)
Glucose, Bld: 67 mg/dL — ABNORMAL LOW (ref 70–99)
Potassium: 4.7 mmol/L (ref 3.5–5.1)
Sodium: 141 mmol/L (ref 135–145)

## 2020-10-08 LAB — CBC
HCT: 32.4 % — ABNORMAL LOW (ref 39.0–52.0)
Hemoglobin: 11.3 g/dL — ABNORMAL LOW (ref 13.0–17.0)
MCH: 38.7 pg — ABNORMAL HIGH (ref 26.0–34.0)
MCHC: 34.9 g/dL (ref 30.0–36.0)
MCV: 111 fL — ABNORMAL HIGH (ref 80.0–100.0)
Platelets: 122 10*3/uL — ABNORMAL LOW (ref 150–400)
RBC: 2.92 MIL/uL — ABNORMAL LOW (ref 4.22–5.81)
RDW: 13.5 % (ref 11.5–15.5)
WBC: 7.4 10*3/uL (ref 4.0–10.5)
nRBC: 0 % (ref 0.0–0.2)

## 2020-10-08 MED ORDER — ACETAMINOPHEN 325 MG PO TABS: 650.0000 mg | ORAL_TABLET | Freq: Four times a day (QID) | ORAL | Status: DC | PRN

## 2020-10-08 MED ORDER — OXYBUTYNIN CHLORIDE 5 MG PO TABS
5.0000 mg | ORAL_TABLET | Freq: Four times a day (QID) | ORAL | Status: DC
  Administered 2020-10-08 – 2020-10-14 (×23): 5 mg via ORAL
  Filled 2020-10-08 (×28): qty 1

## 2020-10-08 MED ORDER — ACETAMINOPHEN 650 MG RE SUPP: 650.0000 mg | Freq: Four times a day (QID) | RECTAL | Status: DC | PRN

## 2020-10-08 MED ORDER — AMIODARONE HCL 200 MG PO TABS
200.0000 mg | ORAL_TABLET | Freq: Two times a day (BID) | ORAL | Status: DC
  Administered 2020-10-08 – 2020-10-10 (×4): 200 mg via ORAL
  Filled 2020-10-08 (×4): qty 1

## 2020-10-08 MED ORDER — FUROSEMIDE 10 MG/ML IJ SOLN
40.0000 mg | Freq: Every day | INTRAMUSCULAR | Status: DC
  Administered 2020-10-08 – 2020-10-10 (×3): 40 mg via INTRAVENOUS
  Filled 2020-10-08 (×3): qty 4

## 2020-10-08 NOTE — NC FL2 (Signed)
[place] LEVEL OF CARE SCREENING TOOL     IDENTIFICATION  Patient Name: Gerald Odonnell Birthdate: March 26, 1956 Sex: male Admission Date (Current Location): 10/03/2020  [facility] and [place] Number:  Engineering geologist and Address:  [facility], [address], [place], [place] [address]      Provider Number: [id number]  Attending Physician Name and Address:  Edwin Dada, *  Relative Name and Phone Number:       Current Level of Care: Hospital Recommended Level of Care: [place] Prior Approval Number:    Date Approved/Denied:   PASRR Number: [id number]  Discharge Plan: SNF    Current Diagnoses: Patient Active Problem List   Diagnosis Date Noted   Acute metabolic encephalopathy 50/38/8828   Weakness 10/03/2020   Fall 10/03/2020   Hypokalemia 10/03/2020   Sepsis ([place]) 10/03/2020   Atherosclerosis of aorta ([place]) 09/12/2020   Personal history of colon cancer 02/05/2020   Closed bimalleolar fracture 05/05/2018   Benign hypertension 08/22/2017   Encounter for screening colonoscopy 08/22/2017   Dysuria 08/22/2017   Urinary retention 03/15/2017   Prostate cancer ([place]) 03/15/2017   Abscess 01/24/2016   Sepsis secondary to UTI ([place]) 09/10/2015   Chest pain 09/10/2015   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   Clostridium difficile diarrhea    Colitis, infectious 04/23/2015   Incomplete bladder emptying 09/10/2014   Edema leg 09/15/2013   Pelvic and perineal pain 09/15/2013   Lower extremity venous stasis 09/15/2013   Pelvic pain in male 09/15/2013   Benign prostatic hyperplasia with urinary obstruction 07/22/2013    Orientation RESPIRATION BLADDER Height & Weight     Self, Time, Situation, Place  Normal Continent Weight: 265 lb (120.2 kg) Height:  5\' 10"  (177.8 cm)  BEHAVIORAL SYMPTOMS/MOOD NEUROLOGICAL BOWEL NUTRITION STATUS      Incontinent Diet (heart healthy, think liquids)  AMBULATORY STATUS  COMMUNICATION OF NEEDS Skin   Extensive Assist Verbally Normal                       Personal Care Assistance Level of Assistance  Bathing, Feeding, Dressing Bathing Assistance: Maximum assistance Feeding assistance: Independent Dressing Assistance: Maximum assistance     Functional Limitations Info  Sight, Hearing, Speech Sight Info: Adequate Hearing Info: Adequate Speech Info: Adequate    SPECIAL CARE FACTORS FREQUENCY  PT (By licensed PT), OT (By licensed OT)     PT Frequency: 5x OT Frequency: 5x            Contractures Contractures Info: Not present    Additional Factors Info  Code Status, Allergies Code Status Info: full code Allergies Info: no known allergies           Current Medications (10/08/2020):  This is the current hospital active medication list Current Facility-Administered Medications  Medication Dose Route Frequency Provider Last Rate Last Admin   abiraterone acetate (ZYTIGA) tablet 1,000 mg  1,000 mg Oral Daily Agbata, Tochukwu, MD   1,000 mg at 10/08/20 0836   acetaminophen (TYLENOL) tablet 650 mg  650 mg Oral Q6H PRN Oswald Hillock, RPH       Or   acetaminophen (TYLENOL) suppository 650 mg  650 mg Rectal Q6H PRN Oswald Hillock, RPH       amiodarone (PACERONE) tablet 200 mg  200 mg Oral BID Danford, Suann Larry, MD       apixaban (ELIQUIS) tablet 5 mg  5 mg Oral BID Danford, Suann Larry, MD  5 mg at 10/08/20 0837   diltiazem (CARDIZEM CD) 24 hr capsule 240 mg  240 mg Oral Daily Edwin Dada, MD   240 mg at 10/08/20 3893   lip balm (BLISTEX) ointment   Topical PRN Edwin Dada, MD       methocarbamol (ROBAXIN) tablet 500 mg  500 mg Oral Q6H PRN Sharion Settler, NP   500 mg at 10/08/20 1206   metoprolol tartrate (LOPRESSOR) tablet 25 mg  25 mg Oral BID Edwin Dada, MD   25 mg at 10/08/20 0838   ondansetron (ZOFRAN) tablet 4 mg  4 mg Oral Q6H PRN Agbata, Tochukwu, MD       Or   ondansetron (ZOFRAN)  injection 4 mg  4 mg Intravenous Q6H PRN Agbata, Tochukwu, MD   4 mg at 10/06/20 0743   oxyCODONE (Oxy IR/ROXICODONE) immediate release tablet 5 mg  5 mg Oral Q4H PRN Edwin Dada, MD   5 mg at 10/08/20 0837   piperacillin-tazobactam (ZOSYN) IVPB 3.375 g  3.375 g Intravenous Q8H BeersShanon Brow, RPH 12.5 mL/hr at 10/08/20 0412 3.375 g at 10/08/20 0412   predniSONE (DELTASONE) tablet 5 mg  5 mg Oral Q breakfast Agbata, Tochukwu, MD   5 mg at 10/08/20 7342     Discharge Medications: Please see discharge summary for a list of discharge medications.  Relevant Imaging Results:  Relevant Lab Results:   Additional Information [id number]  Gerrianne Scale Daijanae Rafalski, LCSW

## 2020-10-08 NOTE — TOC Initial Note (Signed)
Transition of Care [facility]) - Initial/Assessment Note    Patient Details  Name: Gerald Odonnell MRN: [id number] Date of Birth: Oct 14, 1955  Transition of [facility]) CM/SW Contact:    Eileen Stanford, LCSW Phone Number: 10/08/2020, 1:52 PM  Clinical Narrative:     CSW spoke with pt and pt's daughter Vivien Rota at bedside. Pt's daughter was discussing the importance of pt going to SNF. Pt is resistant but did agree after daughter explained. Pt wants to go to [facility] he is familiar with that place from family experiences and is agreeable to go there. CSW has sent referral.              Expected Discharge Plan: Skilled Nursing Facility Barriers to Discharge: Continued Medical Work up   Patient Goals and CMS Choice Patient states their goals for this hospitalization and ongoing recovery are:: to get pt to snf   Choice offered to / list presented to : Patient, Adult Children  Expected Discharge Plan and Services Expected Discharge Plan: [place] In-house Referral: NA   Post Acute Care Choice: [place] Living arrangements for the past 2 months: Single Family Home                                      Prior Living Arrangements/Services Living arrangements for the past 2 months: Single Family Home Lives with:: Self Patient language and need for interpreter reviewed:: Yes Do you feel safe going back to the place where you live?: Yes      Need for Family Participation in Patient Care: Yes (Comment) Care giver support system in place?: Yes (comment)   Criminal Activity/Legal Involvement Pertinent to Current Situation/Hospitalization: No - Comment as needed  Activities of Daily Living Home Assistive Devices/Equipment: Cane (specify quad or straight) ADL Screening (condition at time of admission) Patient's cognitive ability adequate to safely complete daily activities?: Yes Is the patient deaf or have difficulty hearing?: No Does the patient have difficulty  seeing, even when wearing glasses/contacts?: No Does the patient have difficulty concentrating, remembering, or making decisions?: No Patient able to express need for assistance with ADLs?: Yes Does the patient have difficulty dressing or bathing?: No Independently performs ADLs?: Yes (appropriate for developmental age) Does the patient have difficulty walking or climbing stairs?: Yes Weakness of Legs: Both Weakness of Arms/Hands: None  Permission Sought/Granted Permission sought to share information with : Family Supports Permission granted to share information with : Yes, Verbal Permission Granted  Share Information with NAME: Lauro Regulus  Permission granted to share info w AGENCY: [facility]  Permission granted to share info w Relationship: daughter     Emotional Assessment Appearance:: Appears stated age Attitude/Demeanor/Rapport: Engaged Affect (typically observed): Accepting Orientation: : Oriented to Self, Oriented to Place, Oriented to  Time, Oriented to Situation Alcohol / Substance Use: Not Applicable Psych Involvement: No (comment)  Admission diagnosis:  Lower urinary tract infectious disease [N39.0] Encephalopathy acute [G93.40] Hypoxia [R09.02] Fall, initial encounter [W19.XXXA] Sepsis ([place]) [I45.8] Acute metabolic encephalopathy [K99.83] Patient Active Problem List   Diagnosis Date Noted   Acute metabolic encephalopathy 38/25/0539   Weakness 10/03/2020   Fall 10/03/2020   Hypokalemia 10/03/2020   Sepsis ([place]) 10/03/2020   Atherosclerosis of aorta ([place]) 09/12/2020   Personal history of colon cancer 02/05/2020   Closed bimalleolar fracture 05/05/2018   Benign hypertension 08/22/2017   Encounter for screening colonoscopy 08/22/2017   Dysuria 08/22/2017  Urinary retention 03/15/2017   Prostate cancer ([place]) 03/15/2017   Abscess 01/24/2016   Sepsis secondary to UTI ([place]) 09/10/2015   Chest pain 09/10/2015   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   Clostridium difficile diarrhea     Colitis, infectious 04/23/2015   Incomplete bladder emptying 09/10/2014   Edema leg 09/15/2013   Pelvic and perineal pain 09/15/2013   Lower extremity venous stasis 09/15/2013   Pelvic pain in male 09/15/2013   Benign prostatic hyperplasia with urinary obstruction 07/22/2013   PCP:  Jonetta Osgood, NP Pharmacy:   [place] [place] [address] Phone: [phone] Fax: [phone]  [place] [address] Phone: [phone] Fax: [phone]     Social Determinants of Health (SDOH) Interventions    Readmission Risk Interventions No flowsheet data found.

## 2020-10-08 NOTE — Progress Notes (Signed)
[facility] PROGRESS NOTE    Gerald Odonnell  XAJ:[id number] DOB: 12-Oct-1955 DOA: 10/03/2020 PCP: Jonetta Osgood, NP      Brief Narrative:  [name] is a 65 y.o. M with metastatic prostate cancer on [place], [place] catheter, recurrent UTI, and HTN who presented with decreased mentation, fever.  Evidently the patient fell the night before admission, was unable to get up all night and lay on the floor until he was found the next day and brought to the ER. In the ER, he was confused, febrile, tachycardic, and tachypneic greater than 22.  Chest x-ray clear, CT angiogram unremarkable, urinalysis showed pyuria.  SP catheter exchanged, started on IV antibiotics and admitted.           Assessment & Plan:  Severe sepsis due to Enterococcus and Pseudomonas UTI due to SP catheter Presented with fever, tachypneaa, evidence of endorgan dysfunction, due to SP catheter, present on admission.  Had somnolence/confusion, qSOFA greater than 2, thrombocytopenia.  SP catheter was changed on admission  Initially treated with ceftriaxone, had persistent tachycardia and weakness and sluggish response to treatment.  Then cultures growing Enterococcus faecalis and Pseudomonas  After transition to Zosyn, he is improving -Continue Zosyn    Acute metabolic encephalopathy due to sepsis Presented with somnolence, at baseline he is alert and interactive lives independently.  He continues to be intermittently disoriented but this is imrpoving  Prostate cancer - Continue prednisone and abiraterone  Atrial fibrillation with RVR The patient has had difficult to control tachycardia for the last 3 days.  In the last 24 hours, his rate has been controlled with diltiazem, fluids  Afib is a new diagnosis.  Given his delirium, he is probably not aware of his new Eliquis, but his daughter is aware  -Continue new oral amiodarone -Continue diltiazem PO  - Keep magnesium greater than 2, potassium  greater than 4 - Continue Eliquis  Hypokalemia and hypomagnesemia Resolved with treatment  Thrombocytopenia Due to sepsis and Lovenox, improving  Bladder spasms -Resume oxybutynin  Hypertension Bp was soft, furosemide held.   - Resume furosemide as below  Fluid overload Right arm swelling I do not think this is a cellulitis as he ahs been on antibiotics, and this was the arm his fluids went in.  It is probably not a clot, but this is moot because he has been started on ELiquis for Afib  -Start IV Lasix daily -Strict I/Os -Daily BMP       Disposition: Status is: Inpatient  Remains inpatient appropriate because: he is fluid overloaded rquiring IV lasix and still has some delirium and still requires IV antibiotics  Dispo: The patient is from: Home              Anticipated d/c is to: SNF              Patient currently is not medically stable to d/c.   Difficult to place patient No       Level of care: Progressive Cardiac       MDM: The below labs and imaging reports were reviewed and summarized above.  Medication management as above.This is a severe acute illness with threat to life  DVT prophylaxis: Eliquis apixaban (ELIQUIS) tablet 5 mg  Code Status: FULL Family Communication: Daughter by phone             Subjective: Hematuria is resolved, urine still somewhat dark, abdominal pain is still present, controlled with oral pain medicine still somewhat short  of breath, weak, confused at times, no vomiting, seizures, chest discomfort.   Objective: Vitals:   10/08/20 0429 10/08/20 0711 10/08/20 1128 10/08/20 1511  BP: (!) 145/59 (!) 133/92 (!) 137/31 135/67  Pulse: 87 86 80 80  Resp: 19 20 16 16   Temp: 98.3 F (36.8 C) 97.7 F (36.5 C) 98 F (36.7 C) 98.3 F (36.8 C)  TempSrc:  Oral    SpO2: 98% 98% 97% 97%  Weight:      Height:        Intake/Output Summary (Last 24 hours) at 10/08/2020 1820 Last data filed at 10/08/2020 1721 Gross per  24 hour  Intake 360 ml  Output 4000 ml  Net -3640 ml   Filed Weights   10/03/20 0759  Weight: 120.2 kg    Examination: General appearance: Obese adult male, awake, interactive, appears tired     HEENT: Anicteric, conjunctival pink, lids and lashes normal.  No nasal deformity, discharge, or epistaxis, oropharynx dry, lips normal, edentulous, no oral lesion Skin: Cap refill normal, no suspicious rashes or lesions Cardiac: Irregular, but rate controlled now, no murmurs, JVP not visible, 2+ bilateral lower extremity edema Respiratory: Tachypneic, No rales or wheezing Abdomen: Abdomen with tenderness palpation the pubic area around his SP catheter, no ascites or distention, he has some MSK: Right arm very swollen, some redness Neuro: Awake and alert, extraocular movements intact, moves all extremities with generalized weakness, speech fluent Psych: Attention normal, affect blunted, judgment insight appear impaired      Data Reviewed: I have personally reviewed following labs and imaging studies:  CBC: Recent Labs  Lab 10/03/20 0805 10/04/20 0624 10/05/20 0519 10/06/20 0450 10/07/20 0430 10/08/20 0521  WBC 11.5* 8.9 6.5 7.0 8.1 7.4  NEUTROABS 10.1*  --   --   --   --   --   HGB 13.1 12.2* 10.9* 11.8* 12.2* 11.3*  HCT 36.0* 34.7* 31.8* 34.3* 35.5* 32.4*  MCV 106.8* 111.6* 112.4* 111.7* 111.3* 111.0*  PLT 117* 100* 87* 102* 122* 923*   Basic Metabolic Panel: Recent Labs  Lab 10/03/20 1010 10/04/20 0624 10/05/20 0519 10/06/20 0450 10/07/20 0430 10/08/20 0521  NA  --  136 135 138 137 141  K  --  4.3 3.6 3.6 3.9 4.7  CL  --  107 108 115* 112* 110  CO2  --  23 19* 19* 21* 26  GLUCOSE  --  95 76 67* 62* 67*  BUN  --  10 11 14 14 11   CREATININE  --  0.68 0.56* 0.51* 0.51* 0.61  CALCIUM  --  8.2* 8.0* 8.4* 8.3* 8.7*  MG 1.6* 2.2  --  2.3  --   --    GFR: Estimated Creatinine Clearance: 121.3 mL/min (by C-G formula based on SCr of 0.61 mg/dL). Liver Function  Tests: Recent Labs  Lab 10/03/20 0805 10/06/20 0450  AST 53* 39  ALT 25 22  ALKPHOS 136* 77  BILITOT 3.8* 1.6*  PROT 6.6 5.6*  ALBUMIN 3.0* 2.5*   Recent Labs  Lab 10/03/20 0805  LIPASE 18   No results for input(s): AMMONIA in the last 168 hours. Coagulation Profile: Recent Labs  Lab 10/04/20 0624  INR 1.5*   Cardiac Enzymes: Recent Labs  Lab 10/03/20 0805  CKTOTAL 201   BNP (last 3 results) No results for input(s): PROBNP in the last 8760 hours. HbA1C: No results for input(s): HGBA1C in the last 72 hours. CBG: No results for input(s): GLUCAP in the last 168 hours.  Lipid Profile: No results for input(s): CHOL, HDL, LDLCALC, TRIG, CHOLHDL, LDLDIRECT in the last 72 hours. Thyroid Function Tests: No results for input(s): TSH, T4TOTAL, FREET4, T3FREE, THYROIDAB in the last 72 hours. Anemia Panel: Recent Labs    10/07/20 0430 10/07/20 0806  VITAMINB12  --  914  RETICCTPCT 2.1  --    Urine analysis:    Component Value Date/Time   COLORURINE AMBER (A) 10/03/2020 1100   APPEARANCEUR HAZY (A) 10/03/2020 1100   APPEARANCEUR Clear 01/17/2020 0912   LABSPEC 1.015 10/03/2020 1100   LABSPEC 1.002 04/15/2014 1852   PHURINE 7.0 10/03/2020 1100   GLUCOSEU NEGATIVE 10/03/2020 1100   GLUCOSEU Negative 04/15/2014 1852   HGBUR SMALL (A) 10/03/2020 1100   BILIRUBINUR NEGATIVE 10/03/2020 1100   BILIRUBINUR neg 03/18/2020 1456   BILIRUBINUR Negative 01/17/2020 0912   BILIRUBINUR Negative 04/15/2014 1852   KETONESUR 5 (A) 10/03/2020 1100   PROTEINUR 30 (A) 10/03/2020 1100   UROBILINOGEN 0.2 03/18/2020 1456   NITRITE POSITIVE (A) 10/03/2020 1100   LEUKOCYTESUR MODERATE (A) 10/03/2020 1100   LEUKOCYTESUR Negative 04/15/2014 1852   Sepsis Labs: @LABRCNTIP (procalcitonin:4,lacticacidven:4)  ) Recent Results (from the past 240 hour(s))  Resp Panel by RT-PCR (Flu A&B, Covid) Nasopharyngeal Swab     Status: None   Collection Time: 10/03/20  8:05 AM   Specimen:  Nasopharyngeal Swab; Nasopharyngeal(NP) swabs in vial transport medium  Result Value Ref Range Status   SARS Coronavirus 2 by RT PCR NEGATIVE NEGATIVE Final    Comment: (NOTE) SARS-CoV-2 target nucleic acids are NOT DETECTED.  The SARS-CoV-2 RNA is generally detectable in upper respiratory specimens during the acute phase of infection. The lowest concentration of SARS-CoV-2 viral copies this assay can detect is 138 copies/mL. A negative result does not preclude SARS-Cov-2 infection and should not be used as the sole basis for treatment or other patient management decisions. A negative result may occur with  improper specimen collection/handling, submission of specimen other than nasopharyngeal swab, presence of viral mutation(s) within the areas targeted by this assay, and inadequate number of viral copies(<138 copies/mL). A negative result must be combined with clinical observations, patient history, and epidemiological information. The expected result is Negative.  Fact Sheet for Patients:  EntrepreneurPulse.com.au  Fact Sheet for Healthcare Providers:  IncredibleEmployment.be  This test is no t yet approved or cleared by the [place] FDA and  has been authorized for detection and/or diagnosis of SARS-CoV-2 by FDA under an Emergency Use Authorization (EUA). This EUA will remain  in effect (meaning this test can be used) for the duration of the COVID-19 declaration under Section 564(b)(1) of the Act, 21 U.S.C.section 360bbb-3(b)(1), unless the authorization is terminated  or revoked sooner.       Influenza A by PCR NEGATIVE NEGATIVE Final   Influenza B by PCR NEGATIVE NEGATIVE Final    Comment: (NOTE) The Xpert Xpress SARS-CoV-2/FLU/RSV plus assay is intended as an aid in the diagnosis of influenza from Nasopharyngeal swab specimens and should not be used as a sole basis for treatment. Nasal washings and aspirates are unacceptable for  Xpert Xpress SARS-CoV-2/FLU/RSV testing.  Fact Sheet for Patients: EntrepreneurPulse.com.au  Fact Sheet for Healthcare Providers: IncredibleEmployment.be  This test is not yet approved or cleared by the [place] FDA and has been authorized for detection and/or diagnosis of SARS-CoV-2 by FDA under an Emergency Use Authorization (EUA). This EUA will remain in effect (meaning this test can be used) for the duration of the COVID-19 declaration under Section 564(b)(1)  of the Act, 21 U.S.C. section 360bbb-3(b)(1), unless the authorization is terminated or revoked.  Performed at [facility], [address] [place], [place] [address]   Urine culture     Status: Abnormal   Collection Time: 10/03/20 11:31 AM   Specimen: Urine, Random  Result Value Ref Range Status   Specimen Description   Final    URINE, RANDOM Performed at [facility], [address], [place], [place] [address]    Special Requests   Final    NONE Performed at [facility], [address] [place], [place] [address]    Culture MULTIPLE SPECIES PRESENT, SUGGEST RECOLLECTION (A)  Final   Report Status 10/04/2020 FINAL  Final  CULTURE, BLOOD (ROUTINE X 2) w Reflex to ID Panel     Status: None (Preliminary result)   Collection Time: 10/04/20  9:08 AM   Specimen: BLOOD RIGHT HAND  Result Value Ref Range Status   Specimen Description BLOOD RIGHT HAND  Final   Special Requests   Final    BOTTLES DRAWN AEROBIC AND ANAEROBIC Blood Culture adequate volume   Culture   Final    NO GROWTH 4 DAYS Performed at [facility], [address] [place], [place] [address]    Report Status PENDING  Incomplete  CULTURE, BLOOD (ROUTINE X 2) w Reflex to ID Panel     Status: None (Preliminary result)   Collection Time: 10/04/20  9:08 AM   Specimen: BLOOD  Result Value Ref Range Status   Specimen Description BLOOD BLOOD LEFT WRIST  Final   Special  Requests   Final    BOTTLES DRAWN AEROBIC AND ANAEROBIC Blood Culture adequate volume   Culture   Final    NO GROWTH 4 DAYS Performed at [facility], [address], [place], [place] [address]    Report Status PENDING  Incomplete  Urine Culture     Status: Abnormal   Collection Time: 10/05/20  7:35 AM   Specimen: Urine, Random  Result Value Ref Range Status   Specimen Description   Final    URINE, RANDOM Performed at [facility], [address] [place], [place] [address]    Special Requests   Final    NONE Performed at [facility], [address] [place],  [address]    Culture (A)  Final    30,000 COLONIES/mL ENTEROCOCCUS FAECALIS 20,000 COLONIES/mL PSEUDOMONAS AERUGINOSA    Report Status 10/08/2020 FINAL  Final   Organism ID, Bacteria ENTEROCOCCUS FAECALIS (A)  Final   Organism ID, Bacteria PSEUDOMONAS AERUGINOSA (A)  Final      Susceptibility   Enterococcus faecalis - MIC*    AMPICILLIN <=2 SENSITIVE Sensitive     NITROFURANTOIN <=16 SENSITIVE Sensitive     VANCOMYCIN 1 SENSITIVE Sensitive     * 30,000 COLONIES/mL ENTEROCOCCUS FAECALIS   Pseudomonas aeruginosa - MIC*    CEFTAZIDIME 4 SENSITIVE Sensitive     CIPROFLOXACIN <=0.25 SENSITIVE Sensitive     GENTAMICIN <=1 SENSITIVE Sensitive     IMIPENEM 2 SENSITIVE Sensitive     PIP/TAZO 8 SENSITIVE Sensitive     CEFEPIME 2 SENSITIVE Sensitive     * 20,000 COLONIES/mL PSEUDOMONAS AERUGINOSA         Radiology Studies: No results found.      Scheduled Meds:  abiraterone acetate  1,000 mg Oral Daily   amiodarone  200 mg Oral BID   apixaban  5 mg Oral BID   diltiazem  240 mg Oral Daily   furosemide  40 mg Intravenous Daily   metoprolol tartrate  25 mg Oral BID   oxybutynin  5 mg Oral QID   predniSONE  5 mg Oral Q breakfast   Continuous Infusions:  piperacillin-tazobactam (ZOSYN)  IV 3.375 g (10/08/20 1442)     LOS: 5 days    Time spent: 25   minutes    Edwin Dada, MD [facility] 10/08/2020, 6:20 PM     Please page though [place] or Epic secure chat:  For [employer], Adult nurse

## 2020-10-09 LAB — CBC
HCT: 33.3 % — ABNORMAL LOW (ref 39.0–52.0)
Hemoglobin: 11.7 g/dL — ABNORMAL LOW (ref 13.0–17.0)
MCH: 38 pg — ABNORMAL HIGH (ref 26.0–34.0)
MCHC: 35.1 g/dL (ref 30.0–36.0)
MCV: 108.1 fL — ABNORMAL HIGH (ref 80.0–100.0)
Platelets: 155 10*3/uL (ref 150–400)
RBC: 3.08 MIL/uL — ABNORMAL LOW (ref 4.22–5.81)
RDW: 13.5 % (ref 11.5–15.5)
WBC: 8 10*3/uL (ref 4.0–10.5)
nRBC: 0 % (ref 0.0–0.2)

## 2020-10-09 LAB — BASIC METABOLIC PANEL
Anion gap: 12 (ref 5–15)
BUN: 11 mg/dL (ref 8–23)
CO2: 26 mmol/L (ref 22–32)
Calcium: 8.2 mg/dL — ABNORMAL LOW (ref 8.9–10.3)
Chloride: 105 mmol/L (ref 98–111)
Creatinine, Ser: 0.58 mg/dL — ABNORMAL LOW (ref 0.61–1.24)
GFR, Estimated: >60 mL/min (ref >60)
Glucose, Bld: 68 mg/dL — ABNORMAL LOW (ref 70–99)
Potassium: 3 mmol/L — ABNORMAL LOW (ref 3.5–5.1)
Sodium: 143 mmol/L (ref 135–145)

## 2020-10-09 LAB — CULTURE, BLOOD (ROUTINE X 2)
Culture: NO GROWTH
Culture: NO GROWTH
Special Requests: ADEQUATE
Special Requests: ADEQUATE

## 2020-10-09 MED ORDER — POLYETHYLENE GLYCOL 3350 17 G PO PACK
17.0000 g | PACK | Freq: Once | ORAL | Status: AC
  Administered 2020-10-09: 17 g via ORAL
  Filled 2020-10-09: qty 1

## 2020-10-09 MED ORDER — POTASSIUM CHLORIDE CRYS ER 20 MEQ PO TBCR
40.0000 meq | EXTENDED_RELEASE_TABLET | ORAL | Status: AC
  Administered 2020-10-09 (×2): 40 meq via ORAL
  Filled 2020-10-09 (×2): qty 2

## 2020-10-09 MED ORDER — AMOXICILLIN 500 MG PO CAPS
500.0000 mg | ORAL_CAPSULE | Freq: Three times a day (TID) | ORAL | Status: AC
  Administered 2020-10-09 – 2020-10-13 (×14): 500 mg via ORAL
  Filled 2020-10-09 (×16): qty 1

## 2020-10-09 MED ORDER — CIPROFLOXACIN HCL 500 MG PO TABS
500.0000 mg | ORAL_TABLET | Freq: Two times a day (BID) | ORAL | Status: AC
  Administered 2020-10-09 – 2020-10-14 (×10): 500 mg via ORAL
  Filled 2020-10-09 (×12): qty 1

## 2020-10-09 NOTE — Progress Notes (Signed)
[place] [facility] [place] NAME: Gerald Odonnell    MR#:  [id number]  DATE OF BIRTH:  11-19-1955  SUBJECTIVE:  complains of weakness. Tolerating PO diet. Bilateral upper extremity and lower extremity swelling improving. Good urine output after IV Lasix.  REVIEW OF SYSTEMS:   Review of Systems  Constitutional:  Negative for chills, fever and weight loss.  HENT:  Negative for ear discharge, ear pain and nosebleeds.   Eyes:  Negative for blurred vision, pain and discharge.  Respiratory:  Negative for sputum production, shortness of breath, wheezing and stridor.   Cardiovascular:  Positive for leg swelling. Negative for chest pain, palpitations, orthopnea and PND.  Gastrointestinal:  Negative for abdominal pain, diarrhea, nausea and vomiting.  Genitourinary:  Negative for frequency and urgency.  Musculoskeletal:  Negative for back pain and joint pain.  Neurological:  Positive for weakness. Negative for sensory change, speech change and focal weakness.  Psychiatric/Behavioral:  Negative for depression and hallucinations. The patient is not nervous/anxious.   Tolerating Diet:yes Tolerating PT: reahb  DRUG ALLERGIES:  No Known Allergies  VITALS:  Blood pressure (!) 152/76, pulse 77, temperature 98.1 F (36.7 C), temperature source Oral, resp. rate 17, height 5\' 10"  (1.778 m), weight 120.2 kg, SpO2 95 %.  PHYSICAL EXAMINATION:   Physical Exam  GENERAL:  65 y.o.-year-old patient lying in the bed with no acute distress.  HEENT: Head atraumatic, normocephalic. Oropharynx and nasopharynx clear.  LUNGS: Normal breath sounds bilaterally, no wheezing, rales, rhonchi. No use of accessory muscles of respiration.  CARDIOVASCULAR: S1, S2 normal. No murmurs, rubs, or gallops.  ABDOMEN: Soft, nontender, nondistended. Bowel sounds present. No organomegaly or mass.  EXTREMITIES: bilateral lower extremity venous stasis changes and edema improving. Right upper  extremity bruise on the wrist and edema improved NEUROLOGIC: grossly nonfocal. PSYCHIATRIC:  patient is alert and awake SKIN: No obvious rash, lesion, or ulcer.   LABORATORY PANEL:  CBC Recent Labs  Lab 10/09/20 0626  WBC 8.0  HGB 11.7*  HCT 33.3*  PLT 155    Chemistries  Recent Labs  Lab 10/06/20 0450 10/07/20 0430 10/09/20 0626  NA 138   < > 143  K 3.6   < > 3.0*  CL 115*   < > 105  CO2 19*   < > 26  GLUCOSE 67*   < > 68*  BUN 14   < > 11  CREATININE 0.51*   < > 0.58*  CALCIUM 8.4*   < > 8.2*  MG 2.3  --   --   AST 39  --   --   ALT 22  --   --   ALKPHOS 77  --   --   BILITOT 1.6*  --   --    < > = values in this interval not displayed.   Cardiac Enzymes No results for input(s): TROPONINI in the last 168 hours. RADIOLOGY:  No results found. ASSESSMENT AND PLAN:  [name] is a 65 y.o. M with metastatic prostate cancer on [place], [place] catheter, recurrent UTI, and HTN who presented with decreased mentation, fever.  Severe sepsis due to Enterococcus and Pseudomonas UTI due to SP catheter (chronic) --Presented with fever, tachypneaa, evidence of endorgan dysfunction, due to SP catheter, present on admission.  Had somnolence/confusion, qSOFA greater than 2, thrombocytopenia.  --SP catheter was changed on admission  -- Urine cultures growing Enterococcus faecalis and Pseudomonas -- switch to PO Cipro and amoxicillin -- clinically improving  sepsis resolved   Acute metabolic encephalopathy due to sepsis Presented with somnolence, at baseline he is alert and interactive lives independently.  He continues to be intermittently disoriented but this is improving -- sepsis resolved   Prostate cancer - Continue prednisone and abiraterone (using pt's own medication)   Atrial fibrillation with RVR -Continue new oral amiodarone -Continue diltiazem PO  - Keep magnesium greater than 2, potassium greater than 4 - Continue Eliquis--dter aware   Hypokalemia and  hypomagnesemia Resolved with treatment   Thrombocytopenia Resolved -plt count 155K   Bladder spasms -Resume oxybutynin   Hypertension - Resume furosemide as below   Fluid overload Right arm swelling right him swelling is improving. -Start IV Lasix daily-- urine output. Will change to PO daily from tomorrow -Strict I/Os - readmission remains stable at .5   overall improving slowly. TOC for discharge planning to rehab.        Procedures: Family communication : Gerald Odonnell daughter on the phone Consults : CODE STATUS: full DVT Prophylaxis : eliquis Level of care: Progressive Cardiac Status is: Inpatient  Remains inpatient appropriate because:Inpatient level of care appropriate due to severity of illness  Dispo: The patient is from: Home              Anticipated d/c is to: SNF              Patient currently is medically stable to d/c.   Difficult to place patient No  patient overall is improving. Will discharge to rehab once bed available. TOC aware and work in progress.      TOTAL TIME TAKING CARE OF THIS PATIENT: 35 minutes.  >50% time spent on counselling and coordination of care  Note: This dictation was prepared with Dragon dictation along with smaller phrase technology. Any transcriptional errors that result from this process are unintentional.  Gerald Odonnell M.D    [facility]   CC: Primary care physician; Gerald Osgood, NP [name], male   DOB: [birth year], 65 y.o.   MRN: [id number]

## 2020-10-09 NOTE — Care Management Important Message (Signed)
Important Message  Patient Details  Name: Gerald Odonnell MRN: [id number] Date of Birth: July 05, 1955   Medicare Important Message Given:  Yes     Dannette Barbara 10/09/2020, 10:59 AM

## 2020-10-09 NOTE — Consult Note (Signed)
Pharmacy Antibiotic Note  Gerald Odonnell is a 65 y.o. male w/ h/o metastatic prostate cancer (on xytiga), SP catheter, recurrent UTI's, & HTN admitted on 10/03/2020 for dec'd mentation and fever with c/f sepsis and UTI.  Pharmacy has been consulted for Zosyn dosing.  Ucx (PsA 20k CFU & E.Faecalis 30k CFUs  Plan: Day 3 of abx. On pip/tazo 3.375 g q8H.    Height: 5\' 10"  (177.8 cm) Weight: 120.2 kg (265 lb) IBW/kg (Calculated) : 73  Temp (24hrs), Avg:98.2 F (36.8 C), Min:98 F (36.7 C), Max:98.4 F (36.9 C)  Recent Labs  Lab 10/03/20 1326 10/03/20 1652 10/04/20 0624 10/04/20 0947 10/05/20 0519 10/05/20 1249 10/06/20 0450 10/07/20 0430 10/08/20 0521 10/09/20 0626  WBC  --   --    < >  --  6.5  --  7.0 8.1 7.4 8.0  CREATININE  --   --    < >  --  0.56*  --  0.51* 0.51* 0.61 0.58*  LATICACIDVEN 1.2 2.3*  --  2.0*  --  1.1  --   --   --   --    < > = values in this interval not displayed.     Estimated Creatinine Clearance: 121.3 mL/min (A) (by C-G formula based on SCr of 0.58 mg/dL (L)).    No Known Allergies  Antimicrobials this admission: CTX (6/09-12); ZOS 6/13 >>  Dose adjustments this admission: N/A  Microbiology results: 6/10 BCx: NG3D 6/11 UCx: PsA 20k CFU & E.Faecalis 30k CFUs -- both pending sensitivities  6/09 UCx: multi spp; req's recollection  6/09 Flu/Covid negative  Thank you for allowing pharmacy to be a part of this patient's care.  Oswald Hillock 10/09/2020 8:29 AM

## 2020-10-10 LAB — CREATININE, SERUM
Creatinine, Ser: 0.46 mg/dL — ABNORMAL LOW (ref 0.61–1.24)
GFR, Estimated: >60 mL/min (ref >60)

## 2020-10-10 MED ORDER — CIPROFLOXACIN HCL 500 MG PO TABS: 500.0000 mg | ORAL_TABLET | Freq: Two times a day (BID) | ORAL | 0 refills | Status: AC

## 2020-10-10 MED ORDER — METOPROLOL TARTRATE 25 MG PO TABS: 25.0000 mg | ORAL_TABLET | Freq: Two times a day (BID) | ORAL | 1 refills | Status: DC

## 2020-10-10 MED ORDER — APIXABAN 5 MG PO TABS: 5.0000 mg | ORAL_TABLET | Freq: Two times a day (BID) | ORAL | 1 refills | Status: DC

## 2020-10-10 MED ORDER — OXYCODONE HCL 5 MG PO TABS: 5.0000 mg | ORAL_TABLET | Freq: Four times a day (QID) | ORAL | 0 refills | Status: DC | PRN

## 2020-10-10 MED ORDER — AMIODARONE HCL 200 MG PO TABS: 200.0000 mg | ORAL_TABLET | Freq: Two times a day (BID) | ORAL | 1 refills | Status: DC

## 2020-10-10 MED ORDER — AMOXICILLIN 500 MG PO CAPS: 500.0000 mg | ORAL_CAPSULE | Freq: Three times a day (TID) | ORAL | 0 refills | Status: AC

## 2020-10-10 MED ORDER — DILTIAZEM HCL ER COATED BEADS 240 MG PO CP24: 240.0000 mg | ORAL_CAPSULE | Freq: Every day | ORAL | 1 refills | Status: DC

## 2020-10-10 NOTE — Discharge Summary (Signed)
[place] [facility] [place] NAME: Zell Martinique    MR#:  [id number]  DATE OF BIRTH:  1956-02-13  DATE OF ADMISSION:  10/03/2020 ADMITTING PHYSICIAN: Collier Bullock, MD  DATE OF DISCHARGE: 6/16/202  PRIMARY CARE PHYSICIAN: Jonetta Osgood, NP    ADMISSION DIAGNOSIS:  Lower urinary tract infectious disease [N39.0] Encephalopathy acute [G93.40] Hypoxia [R09.02] Fall, initial encounter [W19.XXXA] Sepsis ([place]) [U31.4] Acute metabolic encephalopathy [H70.26]  DISCHARGE DIAGNOSIS:  severe sepsis due to UTI in the setting of chronic suprapubic catheter acute metabolic encephalopathy  a fib with RVR resolved history of metastatic prostate cancer  SECONDARY DIAGNOSIS:   Past Medical History:  Diagnosis Date   Cancer ([place])    colon   Cancer ([place])    bone cancer   Colon cancer ([place])    Hx of   History of kidney stones    Hypertension    Prostate cancer ([place])    Prostate cancer ([place])    Hx of    S/P colon resection    Shortness of breath dyspnea    Suprapubic catheter ([place])    Urinary retention     HOSPITAL COURSE:   Mr. Martinique is a 65 y.o. M with metastatic prostate cancer on [place], [place] catheter, recurrent UTI, and HTN who presented with decreased mentation, fever.   Severe sepsis due to Enterococcus and Pseudomonas UTI due to SP catheter (chronic) --Presented with fever, tachypneaa, evidence of endorgan dysfunction, due to SP catheter, present on admission.  Had somnolence/confusion, qSOFA greater than 2, thrombocytopenia.  --SP catheter was changed on admission  -- Urine cultures growing Enterococcus faecalis and Pseudomonas -- switch to PO Cipro and amoxicillin -- clinically improving sepsis resolved -- patient follows with [facility] urology for his suprapubic catheter   Acute metabolic encephalopathy due to sepsis Presented with somnolence, at baseline he is alert and interactive lives independently.   -- sepsis resolved --  mentation back to baseline   history of metastatic prostate cancer - Continue prednisone and abiraterone (using pt's own medication) -- will follow-up with Dr. Grayland Ormond on his scheduled appointment in July   Atrial fibrillation with RVR--new in the setting of severe sepsis now resolved - patient was initially started on diltiazem and metoprolol. Amiodarone was added since patient remained in a fib initially however now 48 hours has been in sinus rhythm. Echo shows EF of 60 to 65%. Spoke with daughter Lauro Regulus no cardiac history. I will discontinue amiodarone and eliquis. Daughter is aware. -- primary care physician at follow-up to refer to cardiology if needed else can be managed by PCP.  -- reviewed all records no mention of a fib .  Hypokalemia and hypomagnesemia Resolved with treatment   Thrombocytopenia Resolved -plt count 155K   Bladder spasms -Resume oxybutynin   Hypertension - metoprolol and Cardizem   Fluid overload Right arm swelling right him swelling is improving. -Start IV Lasix daily-- urine output. Now on PO Lasix home dose  -- creatinine stable at 0.5   [facility]. Daughter aware.         Procedures: Family communication : Tonya daughter on the phone 6//16 Consults : CODE STATUS: full DVT Prophylaxis : eliquis--now d/ced Level of care: Progressive Cardiac Status is: Inpatient    Dispo: The patient is from: Home              Anticipated d/c is to: SNF  Patient currently is best optimized and medically stable to d/c.              Difficult to place patient No         CONSULTS OBTAINED:    DRUG ALLERGIES:  No Known Allergies  DISCHARGE MEDICATIONS:   Allergies as of 10/10/2020   No Known Allergies      Medication List     STOP taking these medications    ibuprofen 800 MG tablet Commonly known as: ADVIL   mirabegron ER 25 MG Tb24 tablet Commonly known as: MYRBETRIQ   potassium chloride SA  20 MEQ tablet Commonly known as: KLOR-CON       TAKE these medications    abiraterone acetate 250 MG tablet Commonly known as: ZYTIGA TAKE 4 TABLETS (1,000 MG TOTAL) BY MOUTH DAILY. TAKE ON AN EMPTY STOMACH 1 HOUR BEFORE OR 2 HOURS AFTER A MEAL.   amoxicillin 500 MG capsule Commonly known as: AMOXIL Take 1 capsule (500 mg total) by mouth every 8 (eight) hours for 12 doses.   ciprofloxacin 500 MG tablet Commonly known as: CIPRO Take 1 tablet (500 mg total) by mouth 2 (two) times daily for 8 doses.   diltiazem 240 MG 24 hr capsule Commonly known as: CARDIZEM CD Take 1 capsule (240 mg total) by mouth daily. Start taking on: October 11, 2020   docusate sodium 100 MG capsule Commonly known as: COLACE Take 100 mg by mouth 2 (two) times daily.   furosemide 20 MG tablet Commonly known as: LASIX Take 1 tablet (20 mg total) by mouth 2 (two) times daily.   metoprolol tartrate 25 MG tablet Commonly known as: LOPRESSOR Take 1 tablet (25 mg total) by mouth 2 (two) times daily.   oxybutynin 5 MG tablet Commonly known as: DITROPAN Take 1 tablet (5 mg total) by mouth 4 (four) times daily.   oxyCODONE 5 MG immediate release tablet Commonly known as: Oxy IR/ROXICODONE Take 1 tablet (5 mg total) by mouth every 6 (six) hours as needed for severe pain.   predniSONE 5 MG tablet Commonly known as: DELTASONE TAKE 1 TABLET BY MOUTH EVERYDAY WITH BREAKFAST   senna 8.6 MG Tabs tablet Commonly known as: SENOKOT Take 1 tablet by mouth daily.   Vitamin D 125 MCG (5000 UT) Caps Take 1 capsule by mouth daily.        If you experience worsening of your admission symptoms, develop shortness of breath, life threatening emergency, suicidal or homicidal thoughts you must seek medical attention immediately by calling 911 or calling your MD immediately  if symptoms less severe.  You Must read complete instructions/literature along with all the possible adverse reactions/side effects for all the  Medicines you take and that have been prescribed to you. Take any new Medicines after you have completely understood and accept all the possible adverse reactions/side effects.   Please note  You were cared for by a hospitalist during your hospital stay. If you have any questions about your discharge medications or the care you received while you were in the hospital after you are discharged, you can call the unit and asked to speak with the hospitalist on call if the hospitalist that took care of you is not available. Once you are discharged, your primary care physician will handle any further medical issues. Please note that NO REFILLS for any discharge medications will be authorized once you are discharged, as it is imperative that you return to your primary care physician (or establish  a relationship with a primary care physician if you do not have one) for your aftercare needs so that they can reassess your need for medications and monitor your lab values. Today   SUBJECTIVE  no new complaints. Patient intermittently has bladder spasms.   VITAL SIGNS:  Blood pressure (!) 121/59, pulse 74, temperature 98.4 F (36.9 C), temperature source Oral, resp. rate 19, height 5\' 10"  (1.778 m), weight 104.7 kg, SpO2 95 %.  I/O:   Intake/Output Summary (Last 24 hours) at 10/10/2020 1003 Last data filed at 10/10/2020 0755 Gross per 24 hour  Intake 590 ml  Output 3150 ml  Net -2560 ml    PHYSICAL EXAMINATION:  GENERAL:  65 y.o.-year-old patient lying in the bed with no acute distress. HEENT: Head atraumatic, normocephalic. Oropharynx and nasopharynx clear. LUNGS: Normal breath sounds bilaterally, no wheezing, rales, rhonchi. No use of accessory muscles of respiration. CARDIOVASCULAR: S1, S2 normal. No murmurs, rubs, or gallops. ABDOMEN: Soft, nontender, nondistended. Bowel sounds present. No organomegaly or mass. EXTREMITIES: bilateral lower extremity venous stasis changes and edema improving.  Right upper extremity bruise on the wrist and edema improved NEUROLOGIC: grossly nonfocal. PSYCHIATRIC:  patient is alert and awake SKIN: dry skin both LE and venous stasis changes + DATA REVIEW:   CBC  Recent Labs  Lab 10/09/20 0626  WBC 8.0  HGB 11.7*  HCT 33.3*  PLT 155    Chemistries  Recent Labs  Lab 10/06/20 0450 10/07/20 0430 10/09/20 0626 10/10/20 0435  NA 138   < > 143  --   K 3.6   < > 3.0*  --   CL 115*   < > 105  --   CO2 19*   < > 26  --   GLUCOSE 67*   < > 68*  --   BUN 14   < > 11  --   CREATININE 0.51*   < > 0.58* 0.46*  CALCIUM 8.4*   < > 8.2*  --   MG 2.3  --   --   --   AST 39  --   --   --   ALT 22  --   --   --   ALKPHOS 77  --   --   --   BILITOT 1.6*  --   --   --    < > = values in this interval not displayed.    Microbiology Results   Recent Results (from the past 240 hour(s))  Resp Panel by RT-PCR (Flu A&B, Covid) Nasopharyngeal Swab     Status: None   Collection Time: 10/03/20  8:05 AM   Specimen: Nasopharyngeal Swab; Nasopharyngeal(NP) swabs in vial transport medium  Result Value Ref Range Status   SARS Coronavirus 2 by RT PCR NEGATIVE NEGATIVE Final    Comment: (NOTE) SARS-CoV-2 target nucleic acids are NOT DETECTED.  The SARS-CoV-2 RNA is generally detectable in upper respiratory specimens during the acute phase of infection. The lowest concentration of SARS-CoV-2 viral copies this assay can detect is 138 copies/mL. A negative result does not preclude SARS-Cov-2 infection and should not be used as the sole basis for treatment or other patient management decisions. A negative result may occur with  improper specimen collection/handling, submission of specimen other than nasopharyngeal swab, presence of viral mutation(s) within the areas targeted by this assay, and inadequate number of viral copies(<138 copies/mL). A negative result must be combined with clinical observations, patient history, and epidemiological information.  The expected result is  Negative.  Fact Sheet for Patients:  EntrepreneurPulse.com.au  Fact Sheet for Healthcare Providers:  IncredibleEmployment.be  This test is no t yet approved or cleared by the [place] FDA and  has been authorized for detection and/or diagnosis of SARS-CoV-2 by FDA under an Emergency Use Authorization (EUA). This EUA will remain  in effect (meaning this test can be used) for the duration of the COVID-19 declaration under Section 564(b)(1) of the Act, 21 U.S.C.section 360bbb-3(b)(1), unless the authorization is terminated  or revoked sooner.       Influenza A by PCR NEGATIVE NEGATIVE Final   Influenza B by PCR NEGATIVE NEGATIVE Final    Comment: (NOTE) The Xpert Xpress SARS-CoV-2/FLU/RSV plus assay is intended as an aid in the diagnosis of influenza from Nasopharyngeal swab specimens and should not be used as a sole basis for treatment. Nasal washings and aspirates are unacceptable for Xpert Xpress SARS-CoV-2/FLU/RSV testing.  Fact Sheet for Patients: EntrepreneurPulse.com.au  Fact Sheet for Healthcare Providers: IncredibleEmployment.be  This test is not yet approved or cleared by the [place] FDA and has been authorized for detection and/or diagnosis of SARS-CoV-2 by FDA under an Emergency Use Authorization (EUA). This EUA will remain in effect (meaning this test can be used) for the duration of the COVID-19 declaration under Section 564(b)(1) of the Act, 21 U.S.C. section 360bbb-3(b)(1), unless the authorization is terminated or revoked.  Performed at [facility], [address] [place], [place] [address]   Urine culture     Status: Abnormal   Collection Time: 10/03/20 11:31 AM   Specimen: Urine, Random  Result Value Ref Range Status   Specimen Description   Final    URINE, RANDOM Performed at [facility], [address] [place], [place]  [address]    Special Requests   Final    NONE Performed at [facility], [address] [place], [place] [address]    Culture MULTIPLE SPECIES PRESENT, SUGGEST RECOLLECTION (A)  Final   Report Status 10/04/2020 FINAL  Final  CULTURE, BLOOD (ROUTINE X 2) w Reflex to ID Panel     Status: None   Collection Time: 10/04/20  9:08 AM   Specimen: BLOOD RIGHT HAND  Result Value Ref Range Status   Specimen Description BLOOD RIGHT HAND  Final   Special Requests   Final    BOTTLES DRAWN AEROBIC AND ANAEROBIC Blood Culture adequate volume   Culture   Final    NO GROWTH 5 DAYS Performed at [facility], [address] [place], [place] [address]    Report Status 10/09/2020 FINAL  Final  CULTURE, BLOOD (ROUTINE X 2) w Reflex to ID Panel     Status: None   Collection Time: 10/04/20  9:08 AM   Specimen: BLOOD  Result Value Ref Range Status   Specimen Description BLOOD BLOOD LEFT WRIST  Final   Special Requests   Final    BOTTLES DRAWN AEROBIC AND ANAEROBIC Blood Culture adequate volume   Culture   Final    NO GROWTH 5 DAYS Performed at [facility], [address], [place], [place] [address]    Report Status 10/09/2020 FINAL  Final  Urine Culture     Status: Abnormal   Collection Time: 10/05/20  7:35 AM   Specimen: Urine, Random  Result Value Ref Range Status   Specimen Description   Final    URINE, RANDOM Performed at [facility], [address] [place], [place] [address]  Special Requests   Final    NONE Performed at [facility], [place], [place] [address]    Culture (A)  Final    30,000 COLONIES/mL ENTEROCOCCUS FAECALIS 20,000 COLONIES/mL PSEUDOMONAS AERUGINOSA    Report Status 10/08/2020 FINAL  Final   Organism ID, Bacteria ENTEROCOCCUS FAECALIS (A)  Final   Organism ID, Bacteria PSEUDOMONAS AERUGINOSA (A)  Final      Susceptibility   Enterococcus faecalis - MIC*    AMPICILLIN <=2 SENSITIVE Sensitive      NITROFURANTOIN <=16 SENSITIVE Sensitive     VANCOMYCIN 1 SENSITIVE Sensitive     * 30,000 COLONIES/mL ENTEROCOCCUS FAECALIS   Pseudomonas aeruginosa - MIC*    CEFTAZIDIME 4 SENSITIVE Sensitive     CIPROFLOXACIN <=0.25 SENSITIVE Sensitive     GENTAMICIN <=1 SENSITIVE Sensitive     IMIPENEM 2 SENSITIVE Sensitive     PIP/TAZO 8 SENSITIVE Sensitive     CEFEPIME 2 SENSITIVE Sensitive     * 20,000 COLONIES/mL PSEUDOMONAS AERUGINOSA    RADIOLOGY:  No results found.   CODE STATUS:     Code Status Orders  (From admission, onward)           Start     Ordered   10/03/20 1354  Full code  Continuous        10/03/20 1355           Code Status History     Date Active Date Inactive Code Status Order ID Comments User Context   09/10/2015 1822 09/13/2015 1549 Full Code [id number]  Lytle Butte, MD ED   04/23/2015 2007 04/29/2015 1539 Full Code [id number]  Epifanio Lesches, MD ED      Advance Directive Documentation    Flowsheet Row Most Recent Value  Type of Advance Directive Healthcare Power of Attorney  Pre-existing out of facility DNR order (yellow form or pink MOST form) --  "MOST" Form in Place? --        TOTAL TIME TAKING CARE OF THIS PATIENT: 40 minutes.    Fritzi Mandes M.D  [facility]    CC: Primary care physician; Jonetta Osgood, NP

## 2020-10-10 NOTE — TOC Progression Note (Signed)
Transition of Care [facility]) - Progression Note    Patient Details  Name: Kaiser K Martinique MRN: [id number] Date of Birth: 1956/01/12  [facility] of [facility]) CM/SW Contact  Eileen Stanford, LCSW Phone Number: 10/10/2020, 12:05 PM  Clinical Narrative:   [facility] started auth.    Expected Discharge Plan: [place] Barriers to Discharge: Continued Medical Work up  Expected Discharge Plan and Services Expected Discharge Plan: [place] In-house Referral: NA   Post Acute Care Choice: [place] Living arrangements for the past 2 months: Single Family Home Expected Discharge Date: 10/10/20                                     Social Determinants of Health (SDOH) Interventions    Readmission Risk Interventions No flowsheet data found.

## 2020-10-10 NOTE — Progress Notes (Signed)
Physical Therapy Treatment Patient Details Name: Gerald Odonnell MRN: [id number] DOB: Sep 27, 1955 Today's Date: 10/10/2020    History of Present Illness presented to ER secondary to progressive weakness, fall in home environment; admitted for management of acute metabolic encephalopathy, sepsis related to UTI.    PT Comments    Pt in bed, c/o general discomfort on bottom.  Repositioned to comfort by rolling left/right to adjust pads with mod a x 1 and max a/dependant to scoot up in bed in supine.  He does participate in supine A/AAROM 2 x 10 with mod encouragement to complete ranges on his own.  He tolerates exercises well with minimal fatigue but declines further OOB or sitting activity.  He does voice he is looking forward to getting home to his dog Pepper after completing rehab.  Pt is encouraged to do HEP on his own several times per day and he voices understanding.  Education provided that he will need to significantly increase his mobility in order to reach his goals of returning home.   Follow Up Recommendations  SNF     Equipment Recommendations       Recommendations for Other Services       Precautions / Restrictions Precautions Precautions: Fall Precaution Comments: suprapubic catheter Restrictions Weight Bearing Restrictions: No    Mobility  Bed Mobility Overal bed mobility: Needs Assistance Bed Mobility: Rolling Rolling: Mod assist         General bed mobility comments: rolls slightly left and rigth for re-adjusting pad but would need max a for complete roll.    Transfers                 General transfer comment: declined  Ambulation/Gait                 Stairs             Wheelchair Mobility    Modified Rankin (Stroke Patients Only)       Balance                                            Cognition Arousal/Alertness: Awake/alert Behavior During Therapy: WFL for tasks assessed/performed Overall Cognitive  Status: Within Functional Limits for tasks assessed                                        Exercises Other Exercises Other Exercises: BLE A/AAROM 2 x 10 supine with mod encouragement to complete ranges on his own which he is capable of.    General Comments        Pertinent Vitals/Pain Pain Assessment: Faces Faces Pain Scale: Hurts little more Pain Location: coccyx soreness Pain Descriptors / Indicators: Sore Pain Intervention(s): Monitored during session;Repositioned    Home Living                      Prior Function            PT Goals (current goals can now be found in the care plan section) Progress towards PT goals: Progressing toward goals    Frequency    Min 2X/week      PT Plan Current plan remains appropriate    Co-evaluation              AM-PAC  PT "6 Clicks" Mobility   Outcome Measure  Help needed turning from your back to your side while in a flat bed without using bedrails?: A Lot Help needed moving from lying on your back to sitting on the side of a flat bed without using bedrails?: Total Help needed moving to and from a bed to a chair (including a wheelchair)?: Total Help needed standing up from a chair using your arms (e.g., wheelchair or bedside chair)?: Total Help needed to walk in hospital room?: Total Help needed climbing 3-5 steps with a railing? : Total 6 Click Score: 7    End of Session   Activity Tolerance: Patient tolerated treatment well Patient left: in bed;with bed alarm set;with call bell/phone within reach Nurse Communication: Mobility status PT Visit Diagnosis: Muscle weakness (generalized) (M62.81);Difficulty in walking, not elsewhere classified (R26.2)     Time: 3817-7116 PT Time Calculation (min) (ACUTE ONLY): 23 min  Charges:  $Therapeutic Exercise: 8-22 mins $Therapeutic Activity: 8-22 mins                    Chesley Noon, PTA 10/10/20, 2:48 PM , 2:45 PM

## 2020-10-11 MED ORDER — ENOXAPARIN SODIUM 60 MG/0.6ML IJ SOSY
0.5000 mg/kg | PREFILLED_SYRINGE | INTRAMUSCULAR | Status: DC
  Administered 2020-10-11 – 2020-10-13 (×3): 52.5 mg via SUBCUTANEOUS
  Filled 2020-10-11 (×5): qty 0.53

## 2020-10-11 MED ORDER — ENOXAPARIN SODIUM 40 MG/0.4ML IJ SOSY: 40.0000 mg | PREFILLED_SYRINGE | INTRAMUSCULAR | Status: DC

## 2020-10-11 MED ORDER — METOPROLOL TARTRATE 25 MG PO TABS
12.5000 mg | ORAL_TABLET | Freq: Two times a day (BID) | ORAL | Status: DC
  Administered 2020-10-11 – 2020-10-14 (×6): 12.5 mg via ORAL
  Filled 2020-10-11 (×6): qty 1

## 2020-10-11 NOTE — Progress Notes (Addendum)
[facility] [facility] [place] NAME: Gerald Odonnell    MR#:  [id number]  DATE OF BIRTH:  1956/03/28  SUBJECTIVE:  no new complaints. Patient not motivated to work with PT physical therapy. They attended to work yesterday. Denies any complaints. Tells me he wants to go home and be with his dog "pepper"  REVIEW OF SYSTEMS:   Review of Systems  Constitutional:  Negative for chills, fever and weight loss.  HENT:  Negative for ear discharge, ear pain and nosebleeds.   Eyes:  Negative for blurred vision, pain and discharge.  Respiratory:  Negative for sputum production, shortness of breath, wheezing and stridor.   Cardiovascular:  Negative for chest pain, palpitations, orthopnea and PND.  Gastrointestinal:  Negative for abdominal pain, diarrhea, nausea and vomiting.  Genitourinary:  Negative for frequency and urgency.  Musculoskeletal:  Negative for back pain and joint pain.  Neurological:  Positive for weakness. Negative for sensory change, speech change and focal weakness.  Psychiatric/Behavioral:  Negative for depression and hallucinations. The patient is not nervous/anxious.   Tolerating Diet:yes Tolerating PT: Rehab  DRUG ALLERGIES:  No Known Allergies  VITALS:  Blood pressure 118/68, pulse 71, temperature 98.5 F (36.9 C), temperature source Oral, resp. rate 20, height 5\' 10"  (1.778 m), weight 104.7 kg, SpO2 96 %.  PHYSICAL EXAMINATION:    GENERAL:  65 y.o.-year-old patient lying in the bed with no acute distress.Obese HEENT: Head atraumatic, normocephalic. Oropharynx and nasopharynx clear. LUNGS: Normal breath sounds bilaterally, no wheezing, rales, rhonchi. No use of accessory muscles of respiration. CARDIOVASCULAR: S1, S2 normal. No murmurs, rubs, or gallops. ABDOMEN: Soft, nontender, nondistended. Bowel sounds present. No organomegaly or mass. EXTREMITIES: bilateral lower extremity venous stasis changes and edema improving. Right upper  extremity bruise on the wrist and edema improved NEUROLOGIC: grossly nonfocal. PSYCHIATRIC:  patient is alert and awake SKIN as above  LABORATORY PANEL:  CBC Recent Labs  Lab 10/09/20 0626  WBC 8.0  HGB 11.7*  HCT 33.3*  PLT 155    Chemistries  Recent Labs  Lab 10/06/20 0450 10/07/20 0430 10/09/20 0626 10/10/20 0435  NA 138   < > 143  --   K 3.6   < > 3.0*  --   CL 115*   < > 105  --   CO2 19*   < > 26  --   GLUCOSE 67*   < > 68*  --   BUN 14   < > 11  --   CREATININE 0.51*   < > 0.58* 0.46*  CALCIUM 8.4*   < > 8.2*  --   MG 2.3  --   --   --   AST 39  --   --   --   ALT 22  --   --   --   ALKPHOS 77  --   --   --   BILITOT 1.6*  --   --   --    < > = values in this interval not displayed.   Cardiac Enzymes No results for input(s): TROPONINI in the last 168 hours. RADIOLOGY:  No results found. ASSESSMENT AND PLAN:   [name] is a 65 y.o. M with metastatic prostate cancer on [place], [place] catheter, recurrent UTI, and HTN who presented with decreased mentation, fever.   Severe sepsis due to Enterococcus and Pseudomonas UTI due to SP catheter (chronic) --Presented with fever, tachypneaa, evidence of endorgan dysfunction, due to SP catheter,  present on admission.  Had somnolence/confusion, qSOFA greater than 2, thrombocytopenia.  --SP catheter was changed on admission  -- Urine cultures growing Enterococcus faecalis and Pseudomonas -- switch to PO Cipro and amoxicillin -- clinically improving sepsis resolved -- patient follows with [facility] urology for his suprapubic catheter   Acute metabolic encephalopathy due to sepsis Presented with somnolence, at baseline he is alert and interactive lives independently.   -- sepsis resolved -- mentation back to baseline   history of metastatic prostate cancer - Continue prednisone and abiraterone (using pt's own medication) -- will follow-up with Dr. Grayland Ormond on his scheduled appointment in July   Atrial fibrillation  with RVR--new in the setting of severe sepsis now resolved - patient was initially started on diltiazem and metoprolol. Amiodarone was added since patient remained in a fib initially however now 48 hours has been in sinus rhythm. Echo shows EF of 60 to 65%. Spoke with daughter Lauro Regulus no cardiac history. I will discontinue amiodarone and eliquis. Daughter is aware. -- primary care physician at follow-up to refer to cardiology if needed else can be managed by PCP. -- reviewed all records no mention of a fib .  Addendum: patient is BP remained on the softer side systolic 29-798. Was feeling weak while working with physical therapy. I have discontinued patient's Cardizem and decreased dose of metoprolol 12.5 BID with holding parameter less than systolic 95.   Hypokalemia and hypomagnesemia Resolved with treatment   Thrombocytopenia Resolved -plt count 155K   Bladder spasms -Resume oxybutynin   Hypertension - metoprolol and Cardizem   Fluid overload Right arm swelling right him swelling is improving. -IV Lasix daily x2 days-- urine output. Now on PO Lasix home dose  -- creatinine stable at 0.5   [facility] However patient has not participated with PT on two different occasions. Insurance is requiring to do peer to peer review. Waiting for Dr. Otto Herb to touch base with me.  6/17 occupational and physical therapy were going to attempt to see if patient would work with them today. Patient's daughter Lauro Regulus is aware that if patient does not participate in rehab then need to consider the option of home with home health since patient is financially not able to afford private pay.   patient is at a high risk for readmission given his multiple comorbidities.    Procedures: Family communication : Tonya daughter on the phone 6//17 Consults : CODE STATUS: full DVT Prophylaxis : Lovenox Level of care: Progressive Cardiac Status is: Inpatient    Dispo:  The patient is from: Home              Anticipated d/c is to: SNF once insurance approval goes thru              Patient currently is best optimized and medically stable to d/c.              Difficult to place patient No           TOTAL TIME TAKING CARE OF THIS PATIENT: 25 minutes.  >50% time spent on counselling and coordination of care  Note: This dictation was prepared with Dragon dictation along with smaller phrase technology. Any transcriptional errors that result from this process are unintentional.  Fritzi Mandes M.D    [facility]   CC: Primary care physician; Jonetta Osgood, NP [name], male   DOB: 07/23/1955, 65 y.o.   MRN: [id number]

## 2020-10-11 NOTE — Progress Notes (Signed)
Physical Therapy Treatment Patient Details Name: Gerald Odonnell MRN: [id number] DOB: Aug 28, 1955 Today's Date: 10/11/2020    History of Present Illness presented to ER secondary to progressive weakness, fall in home environment; admitted for management of acute metabolic encephalopathy, sepsis related to UTI.    PT Comments    Co-tx with OT for PT session and OT eval.  Pt agrees to [place] activities today.  OT arrived during initiation of session and co-tx with performed for maximum outcomes.  He is able to transition to EOB with mod a x 2.  Cues for hand placements and sequencing.  He is generally steady in sitting with supervision.  Stood to [employer] with mod a x 2 to stand and generally min a x 2 once up.  He is able to static stand for pericare and attempts to sidestep before fatigue.  Once sitting he stated he needed to have BM.  Transferred to commode at bedside with min a x 2 to stand and turn.  While sitting on commode he reports 8/10 dizziness.  BP 83/58.  Care provided and he is able to stand and return to supine with mod a x 2.  BP 103/62 once supine.  RN notified.    Pt more motivated for session today.  He does report fear since fall at home as a factor for hesitation of mobility.  SNF remains quite appropriate for discharge.     Follow Up Recommendations  SNF     Equipment Recommendations       Recommendations for Other Services       Precautions / Restrictions Precautions Precautions: Fall Precaution Comments: suprapubic catheter Restrictions Other Position/Activity Restrictions: orthostatic    Mobility  Bed Mobility Overal bed mobility: Needs Assistance Bed Mobility: Supine to Sit Rolling: Mod assist;+2 for physical assistance   Supine to sit: Mod assist;+2 for physical assistance     General bed mobility comments: cues for hand placement and sequencing    Transfers Overall transfer level: Needs assistance Equipment used: Rolling walker (2 wheeled) Transfers:  Sit to/from Stand Sit to Stand: Min assist;Mod assist;+2 physical assistance            Ambulation/Gait Ambulation/Gait assistance: Min assist;+2 physical assistance Gait Distance (Feet): 3 Feet Assistive device: Rolling walker (2 wheeled) Gait Pattern/deviations: Step-to pattern Gait velocity: decreased   General Gait Details: short shuffling steps to BSC limited by dizziness and dec BP   Stairs             Wheelchair Mobility    Modified Rankin (Stroke Patients Only)       Balance Overall balance assessment: Needs assistance Sitting-balance support: Feet supported Sitting balance-Leahy Scale: Fair     Standing balance support: Bilateral upper extremity supported Standing balance-Leahy Scale: Poor Standing balance comment: +2 and walker for support/safety                            Cognition Arousal/Alertness: Awake/alert Behavior During Therapy: WFL for tasks assessed/performed Overall Cognitive Status: Within Functional Limits for tasks assessed                                        Exercises Other Exercises Other Exercises: to commode to attempt BM.  Small smear    General Comments        Pertinent Vitals/Pain Pain Assessment: Faces Faces Pain Scale:  Hurts a little bit Pain Location: coccyx soreness Pain Descriptors / Indicators: Sore Pain Intervention(s): Limited activity within patient's tolerance;Monitored during session    Home Living                      Prior Function            PT Goals (current goals can now be found in the care plan section) Progress towards PT goals: Progressing toward goals    Frequency    Min 2X/week      PT Plan Current plan remains appropriate    Co-evaluation PT/OT/SLP Co-Evaluation/Treatment: Yes Reason for Co-Treatment: For patient/therapist safety PT goals addressed during session: Mobility/safety with mobility OT goals addressed during session: Other  (comment)      AM-PAC PT "6 Clicks" Mobility   Outcome Measure  Help needed turning from your back to your side while in a flat bed without using bedrails?: A Lot Help needed moving from lying on your back to sitting on the side of a flat bed without using bedrails?: A Lot Help needed moving to and from a bed to a chair (including a wheelchair)?: A Lot Help needed standing up from a chair using your arms (e.g., wheelchair or bedside chair)?: A Lot Help needed to walk in hospital room?: A Lot Help needed climbing 3-5 steps with a railing? : Total 6 Click Score: 11    End of Session Equipment Utilized During Treatment: Gait belt Activity Tolerance: Patient tolerated treatment well;Treatment limited secondary to medical complications (Comment) Patient left: in bed;with bed alarm set;with call bell/phone within reach Nurse Communication: Mobility status PT Visit Diagnosis: Muscle weakness (generalized) (M62.81);Difficulty in walking, not elsewhere classified (R26.2)     Time: 4174-0814 PT Time Calculation (min) (ACUTE ONLY): 25 min  Charges:  $Therapeutic Activity: 23-37 mins                    Chesley Noon, PTA 10/11/20, 3:43 PM , 3:39 PM

## 2020-10-11 NOTE — Progress Notes (Signed)
   10/11/20 0925  Clinical Encounter Type  Visited With Patient  Visit Type Initial;Spiritual support;Social support  Referral From  (rounding)  Spiritual Encounters  Spiritual Needs Prayer;Emotional  [place] visited with Gerald Odonnell to provide support. Chaplain Burris provided reflective listening, compassionate presence, and hospitality. Chaplain Burris offered prayer and encouragement.

## 2020-10-11 NOTE — Progress Notes (Addendum)
[name], male   DOB: 11-03-1955, 65 y.o.   MRN: [id number]  patient worked with physical therapy and occupational be after support and motivation was provided. He is very appropriate for rehab. I am awaiting the insurance MD Dr. Otto Herb to call me back for peer to peer.  patient's blood pressure was soft low today while working with physical therapy.  I have discontinued his Cardizem CD 240 mg daily and decreased metoprolol to 12.5 BID with holding parameter. Patient has been taking it for his history of a fib  4.50 pm:  Spoke with Dr Jethro Bolus insurance MD and I reviewed his PT/OT notes and work from today. She has APPROVED patient for Rehab!!

## 2020-10-11 NOTE — Care Management Important Message (Signed)
Important Message  Patient Details  Name: Gerald Odonnell MRN: [id number] Date of Birth: 03/11/1956   Medicare Important Message Given:  Yes     Dannette Barbara 10/11/2020, 12:33 PM

## 2020-10-11 NOTE — Plan of Care (Signed)
Problem: Education: Goal: Knowledge of General Education information will improve Description: Including pain rating scale, medication(s)/side effects and non-pharmacologic comfort measures 10/11/2020 1051 by Cristela Blue, RN Outcome: Progressing 10/11/2020 1051 by Cristela Blue, RN Outcome: Progressing   Problem: Health Behavior/Discharge Planning: Goal: Ability to manage health-related needs will improve 10/11/2020 1051 by Cristela Blue, RN Outcome: Progressing 10/11/2020 1051 by Cristela Blue, RN Outcome: Progressing   Problem: Clinical Measurements: Goal: Ability to maintain clinical measurements within normal limits will improve 10/11/2020 1051 by Cristela Blue, RN Outcome: Progressing 10/11/2020 1051 by Cristela Blue, RN Outcome: Progressing Goal: Will remain free from infection 10/11/2020 1051 by Cristela Blue, RN Outcome: Progressing 10/11/2020 1051 by Cristela Blue, RN Outcome: Progressing Goal: Diagnostic test results will improve 10/11/2020 1051 by Cristela Blue, RN Outcome: Progressing 10/11/2020 1051 by Cristela Blue, RN Outcome: Progressing Goal: Respiratory complications will improve 10/11/2020 1051 by Cristela Blue, RN Outcome: Progressing 10/11/2020 1051 by Cristela Blue, RN Outcome: Progressing Goal: Cardiovascular complication will be avoided 10/11/2020 1051 by Cristela Blue, RN Outcome: Progressing 10/11/2020 1051 by Cristela Blue, RN Outcome: Progressing   Problem: Activity: Goal: Risk for activity intolerance will decrease 10/11/2020 1051 by Cristela Blue, RN Outcome: Progressing 10/11/2020 1051 by Cristela Blue, RN Outcome: Progressing   Problem: Nutrition: Goal: Adequate nutrition will be maintained 10/11/2020 1051 by Cristela Blue, RN Outcome: Progressing 10/11/2020 1051 by Cristela Blue, RN Outcome: Progressing   Problem: Coping: Goal: Level of anxiety will decrease 10/11/2020 1051 by Cristela Blue, RN Outcome:  Progressing 10/11/2020 1051 by Cristela Blue, RN Outcome: Progressing   Problem: Elimination: Goal: Will not experience complications related to bowel motility 10/11/2020 1051 by Cristela Blue, RN Outcome: Progressing 10/11/2020 1051 by Cristela Blue, RN Outcome: Progressing Goal: Will not experience complications related to urinary retention 10/11/2020 1051 by Cristela Blue, RN Outcome: Progressing 10/11/2020 1051 by Cristela Blue, RN Outcome: Progressing   Problem: Pain Managment: Goal: General experience of comfort will improve 10/11/2020 1051 by Cristela Blue, RN Outcome: Progressing 10/11/2020 1051 by Cristela Blue, RN Outcome: Progressing   Problem: Safety: Goal: Ability to remain free from injury will improve 10/11/2020 1051 by Cristela Blue, RN Outcome: Progressing 10/11/2020 1051 by Cristela Blue, RN Outcome: Progressing   Problem: Skin Integrity: Goal: Risk for impaired skin integrity will decrease 10/11/2020 1051 by Cristela Blue, RN Outcome: Progressing 10/11/2020 1051 by Cristela Blue, RN Outcome: Progressing   Problem: Education: Goal: Knowledge of disease or condition will improve 10/11/2020 1051 by Cristela Blue, RN Outcome: Progressing 10/11/2020 1051 by Cristela Blue, RN Outcome: Progressing Goal: Understanding of medication regimen will improve 10/11/2020 1051 by Cristela Blue, RN Outcome: Progressing 10/11/2020 1051 by Cristela Blue, RN Outcome: Progressing Goal: Individualized Educational Video(s) 10/11/2020 1051 by Cristela Blue, RN Outcome: Progressing 10/11/2020 1051 by Cristela Blue, RN Outcome: Progressing   Problem: Activity: Goal: Ability to tolerate increased activity will improve 10/11/2020 1051 by Cristela Blue, RN Outcome: Progressing 10/11/2020 1051 by Cristela Blue, RN Outcome: Progressing   Problem: Cardiac: Goal: Ability to achieve and maintain adequate cardiopulmonary perfusion will improve 10/11/2020 1051 by Cristela Blue, RN Outcome: Progressing 10/11/2020 1051 by Cristela Blue, RN Outcome: Progressing   Problem: Health Behavior/Discharge Planning: Goal: Ability to safely manage health-related needs after discharge will improve 10/11/2020 1051 by Cristela Blue, RN Outcome: Progressing 10/11/2020 1051 by Cristela Blue, RN Outcome: Progressing   Problem: Urinary Elimination: Goal: Signs and symptoms of infection will decrease 10/11/2020 1051 by Cristela Blue, RN Outcome: Progressing 10/11/2020 1051 by Cristela Blue, RN Outcome: Progressing

## 2020-10-11 NOTE — TOC Progression Note (Signed)
Transition of Care [facility]) - Progression Note    Patient Details  Name: Gerald Odonnell MRN: [id number] Date of Birth: 1956/01/01  Transition of [facility]) CM/SW [place], RN Phone Number: 10/11/2020, 10:47 AM  Clinical Narrative:   Insurance Authorization is pending requiring Dr. Posey Pronto to call for peer review, [phone], option 2 ext: [phone], due to patient not participating in therapy. Information given to Dr. Posey Pronto.    Expected Discharge Plan: [place] Barriers to Discharge: Continued Medical Work up  Expected Discharge Plan and Services Expected Discharge Plan: [place] In-house Referral: NA   Post Acute Care Choice: [place] Living arrangements for the past 2 months: Single Family Home Expected Discharge Date: 10/10/20                                     Social Determinants of Health (SDOH) Interventions    Readmission Risk Interventions No flowsheet data found.

## 2020-10-11 NOTE — Evaluation (Signed)
Occupational Therapy Evaluation Patient Details Name: Gerald Odonnell MRN: [id number] DOB: 09-09-1955 Today's Date: 10/11/2020    History of Present Illness presented to ER secondary to progressive weakness, fall in home environment; admitted for management of acute metabolic encephalopathy, sepsis related to UTI.   Clinical Impression   Pt seen for OT evaluation this date to f/u re: safety with ADLs/ADL mobility. OT and PT co-tx completed to maximize services as pt with low activity tollerance. Pt STS with MOD A +2 with RW and assist/cues to extend hips and put weight through entirety of foot rather than solely back in heels as he can be felt with posterior LOB/sway requiring therapist intervention for fall prevention. Pt requests to use commode and requires MOD A +2 to SPS to commode. BP noted to drop while pt seated on commode with SBP in low 80s and PT/OT coordinated to return pt to bed and supine with MOD A +2. Pt requiring MAX A for peri care. Pt left in bed with BP more stable. RN and MD notified of findings. Continue to recommend STR f/u with OT services as pt has demonstrated increased/improved motivation and participation to utilize therapy services to get stronger.     Follow Up Recommendations  SNF    Equipment Recommendations  Other (comment) (defer)    Recommendations for Other Services       Precautions / Restrictions Precautions Precautions: Fall Precaution Comments: suprapubic catheter Restrictions Weight Bearing Restrictions: No Other Position/Activity Restrictions: orthostatic      Mobility Bed Mobility Overal bed mobility: Needs Assistance Bed Mobility: Supine to Sit Rolling: Mod assist;+2 for physical assistance   Supine to sit: Mod assist;+2 for physical assistance     General bed mobility comments: cues for hand placement and sequencing    Transfers Overall transfer level: Needs assistance Equipment used: Rolling walker (2 wheeled) Transfers: Sit  to/from Stand Sit to Stand: Min assist;Mod assist;+2 physical assistance              Balance Overall balance assessment: Needs assistance Sitting-balance support: Feet supported Sitting balance-Leahy Scale: Fair     Standing balance support: Bilateral upper extremity supported Standing balance-Leahy Scale: Poor Standing balance comment: +2 and walker for support/safety                           ADL either performed or assessed with clinical judgement   ADL Overall ADL's : Needs assistance/impaired                                       General ADL Comments: SETUP to MIN A for seated UB ADLs, MOD A to MAX A for seated and standing LB ADLs such as peri care.     Vision Patient Visual Report: No change from baseline       Perception     Praxis      Pertinent Vitals/Pain Pain Assessment: Faces Faces Pain Scale: Hurts a little bit Pain Location: coccyx soreness Pain Descriptors / Indicators: Sore Pain Intervention(s): Limited activity within patient's tolerance;Monitored during session     Hand Dominance     Extremity/Trunk Assessment Upper Extremity Assessment Upper Extremity Assessment: Generalized weakness   Lower Extremity Assessment Lower Extremity Assessment: Generalized weakness       Communication Communication Communication: No difficulties   Cognition Arousal/Alertness: Awake/alert Behavior During Therapy: WFL for tasks assessed/performed Overall  Cognitive Status: Within Functional Limits for tasks assessed                                 General Comments: Oriented to self, location and general situation; limited insight into deficits and inherent safety needs   General Comments       Exercises Other Exercises Other Exercises: MAX A for standing posterior peri care.   Shoulder Instructions      Home Living Family/patient expects to be discharged to:: Private residence Living Arrangements:  Alone Available Help at Discharge: Family;Available PRN/intermittently Type of Home: House Home Access: Stairs to enter [employer] of Steps: 6 Entrance Stairs-Rails: Right;Left;Can reach both Home Layout: One level               Home Equipment: None          Prior Functioning/Environment Level of Independence: Independent        Comments: Mod indep without assist device for ADLs, household mobilization; does endorse multiple falls in recent weeks        OT Problem List: Decreased strength;Decreased activity tolerance      OT Treatment/Interventions: Self-care/ADL training;Therapeutic exercise;Therapeutic activities;Energy conservation;Patient/family education    OT Goals(Current goals can be found in the care plan section) Acute Rehab OT Goals Patient Stated Goal: to get stronger OT Goal Formulation: With patient Time For Goal Achievement: 10/25/20 Potential to Achieve Goals: Good ADL Goals Pt Will Perform Lower Body Dressing: with min assist;with mod assist;sitting/lateral leans Pt Will Transfer to Toilet: with min assist;with mod assist;stand pivot transfer;bedside commode Pt Will Perform Toileting - Clothing Manipulation and hygiene: with min assist;with mod assist;sit to/from stand Pt Will Perform Tub/Shower Transfer: with min assist;with mod assist Pt/caregiver will Perform Home Exercise Program: Increased strength;Both right and left upper extremity;With Supervision  OT Frequency: Min 1X/week   Barriers to D/C:            Co-evaluation PT/OT/SLP Co-Evaluation/Treatment: Yes Reason for Co-Treatment: Complexity of the patient's impairments (multi-system involvement);For patient/therapist safety PT goals addressed during session: Mobility/safety with mobility OT goals addressed during session: ADL's and self-care      AM-PAC OT "6 Clicks" Daily Activity     Outcome Measure Help from another person eating meals?: None Help from another  person taking care of personal grooming?: A Little Help from another person toileting, which includes using toliet, bedpan, or urinal?: A Lot Help from another person bathing (including washing, rinsing, drying)?: A Lot Help from another person to put on and taking off regular upper body clothing?: A Little Help from another person to put on and taking off regular lower body clothing?: A Lot 6 Click Score: 16   End of Session Equipment Utilized During Treatment: Gait belt;Rolling walker Nurse Communication: Mobility status  Activity Tolerance: Patient tolerated treatment well Patient left: in bed;with call bell/phone within reach;with bed alarm set  OT Visit Diagnosis: Unsteadiness on feet (R26.81);Muscle weakness (generalized) (M62.81)                Time: 2800-3491 OT Time Calculation (min): 29 min Charges:  OT General Charges $OT Visit: 1 Visit OT Evaluation $OT Eval Moderate Complexity: 1 Mod OT Treatments $Self Care/Home Management : 8-22 mins  Gerrianne Scale, MS, OTR/L ascom [phone] 10/11/20, 4:27 PM

## 2020-10-11 NOTE — Progress Notes (Signed)
PHARMACIST - PHYSICIAN COMMUNICATION  CONCERNING:  Enoxaparin (Lovenox) for DVT Prophylaxis    RECOMMENDATION: Patient was prescribed enoxaprin 40mg  q24 hours for VTE prophylaxis.   Filed Weights   10/03/20 0759 10/10/20 0100  Weight: 120.2 kg (265 lb) 104.7 kg (230 lb 12.8 oz)    Body mass index is 33.12 kg/m.  Estimated Creatinine Clearance: 113.1 mL/min (A) (by C-G formula based on SCr of 0.46 mg/dL (L)).   Based on [place] patient is candidate for enoxaparin 0.5mg /kg TBW SQ every 24 hours based on BMI being >30.  DESCRIPTION: Pharmacy has adjusted enoxaparin dose per [facility] policy.  Patient is now receiving enoxaparin 52.5 mg every 24 hours    Berta Minor, PharmD Clinical Pharmacist  10/11/2020 2:54 PM

## 2020-10-12 ENCOUNTER — Inpatient Hospital Stay: Payer: Medicare HMO

## 2020-10-12 DIAGNOSIS — K5900 Constipation, unspecified: Secondary | ICD-10-CM

## 2020-10-12 MED ORDER — FLEET ENEMA 7-19 GM/118ML RE ENEM
1.0000 | ENEMA | Freq: Once | RECTAL | Status: AC
  Administered 2020-10-12: 1 via RECTAL

## 2020-10-12 MED ORDER — HYDROCODONE-ACETAMINOPHEN 5-325 MG PO TABS
1.0000 | ORAL_TABLET | ORAL | Status: DC | PRN
  Administered 2020-10-12 – 2020-10-14 (×8): 1 via ORAL
  Filled 2020-10-12 (×8): qty 1

## 2020-10-12 MED ORDER — MAGNESIUM CITRATE PO SOLN
0.5000 | Freq: Once | ORAL | Status: DC
  Filled 2020-10-12: qty 296

## 2020-10-12 MED ORDER — POTASSIUM CHLORIDE CRYS ER 20 MEQ PO TBCR
40.0000 meq | EXTENDED_RELEASE_TABLET | Freq: Once | ORAL | Status: AC
  Administered 2020-10-12: 40 meq via ORAL
  Filled 2020-10-12: qty 2

## 2020-10-12 MED ORDER — BISACODYL 10 MG RE SUPP
10.0000 mg | Freq: Two times a day (BID) | RECTAL | Status: DC
  Administered 2020-10-12 – 2020-10-13 (×2): 10 mg via RECTAL
  Filled 2020-10-12 (×3): qty 1

## 2020-10-12 MED ORDER — POLYETHYLENE GLYCOL 3350 17 G PO PACK
34.0000 g | PACK | Freq: Once | ORAL | Status: AC
  Administered 2020-10-12: 34 g via ORAL
  Filled 2020-10-12: qty 2

## 2020-10-12 MED ORDER — BISACODYL 5 MG PO TBEC
10.0000 mg | DELAYED_RELEASE_TABLET | Freq: Once | ORAL | Status: AC
  Administered 2020-10-12: 10 mg via ORAL
  Filled 2020-10-12: qty 2

## 2020-10-12 MED ORDER — FUROSEMIDE 20 MG PO TABS
20.0000 mg | ORAL_TABLET | Freq: Every day | ORAL | Status: DC
  Administered 2020-10-12 – 2020-10-14 (×3): 20 mg via ORAL
  Filled 2020-10-12 (×3): qty 1

## 2020-10-12 NOTE — Progress Notes (Addendum)
[place] [facility] [place] NAME: Gerald Odonnell    MR#:  [id number]  DATE OF BIRTH:  Nov 09, 1955  SUBJECTIVE:   Patient reports good bowel movement yesterday, and this morning, no nausea, no vomiting today.    DRUG ALLERGIES:  No Known Allergies  VITALS:  Blood pressure 112/77, pulse 74, temperature 98 F (36.7 C), resp. rate 19, height 5\' 10"  (1.778 m), weight 103 kg, SpO2 96 %.  PHYSICAL EXAMINATION:   Awake Alert, Oriented X 3, No new F.N deficits, Normal affect Symmetrical Chest wall movement, Good air movement bilaterally, diminished air entry at the bases RRR,No Gallops,Rubs or new Murmurs, No Parasternal Heave +ve B.Sounds, Abd Soft, distention significantly improved  no Cyanosis, Clubbing mild bilateral edema has improved, has chronic venous stasis skin changes    LABORATORY PANEL:  CBC Recent Labs  Lab 10/09/20 0626  WBC 8.0  HGB 11.7*  HCT 33.3*  PLT 155     Chemistries  Recent Labs  Lab 10/06/20 0450 10/07/20 0430 10/09/20 0626 10/10/20 0435  NA 138   < > 143  --   K 3.6   < > 3.0*  --   CL 115*   < > 105  --   CO2 19*   < > 26  --   GLUCOSE 67*   < > 68*  --   BUN 14   < > 11  --   CREATININE 0.51*   < > 0.58* 0.46*  CALCIUM 8.4*   < > 8.2*  --   MG 2.3  --   --   --   AST 39  --   --   --   ALT 22  --   --   --   ALKPHOS 77  --   --   --   BILITOT 1.6*  --   --   --    < > = values in this interval not displayed.    Cardiac Enzymes No results for input(s): TROPONINI in the last 168 hours. RADIOLOGY:  DG Chest Port 1 View  Result Date: 10/12/2020 CLINICAL DATA:  Vomiting.  Recent sepsis EXAM: PORTABLE CHEST 1 VIEW COMPARISON:  10/04/2020 FINDINGS: Moderate bibasilar airspace disease. Small bilateral effusions. This has progressed significantly in the interval. Cardiac enlargement with vascular congestion. IMPRESSION: Vascular congestion. Progressive bibasilar atelectasis and effusion. Possible fluid  overload versus pneumonia. Electronically Signed   By: Franchot Gallo M.D.   On: 10/12/2020 11:31   DG Abd Portable 1V  Result Date: 10/12/2020 CLINICAL DATA:  Patient with vomiting and abdominal pain. History of colon cancer. EXAM: PORTABLE ABDOMEN - 1 VIEW COMPARISON:  CT abdomen pelvis 05/15/2020. FINDINGS: There is gaseous distension of the colon particularly the cecum and transverse colon. No significant small bowel gaseous distension. Lumbar spine degenerative changes. IMPRESSION: Nonspecific bowel gas pattern with gaseous distension of the ascending and transverse colon. Electronically Signed   By: Lovey Newcomer M.D.   On: 10/12/2020 12:53   ASSESSMENT AND PLAN:   [name] is a 65 y.o. M with metastatic prostate cancer on [place], [place] catheter, recurrent UTI, and HTN who presented with decreased mentation, fever.   Severe sepsis due to Enterococcus and Pseudomonas UTI due to SP catheter (chronic) --Presented with fever, tachypneaa, evidence of endorgan dysfunction, due to SP catheter, present on admission.  Had somnolence/confusion, qSOFA greater than 2, thrombocytopenia.  --SP catheter was changed on admission  -- Urine cultures growing Enterococcus  faecalis and Pseudomonas -- switch to PO Cipro and amoxicillin -- clinically improving sepsis resolved -- patient follows with [facility] urology for his suprapubic catheter   Acute metabolic encephalopathy due to sepsis Presented with somnolence, at baseline he is alert and interactive lives independently.   -- sepsis resolved -- mentation back to baseline   history of metastatic prostate cancer - Continue prednisone and abiraterone (using pt's own medication) -- will follow-up with Dr. Grayland Ormond on his scheduled appointment in July   Atrial fibrillation with RVR--new in the setting of severe sepsis now resolved - patient was initially started on diltiazem and metoprolol. Amiodarone was added since patient remained in a fib initially  however now 48 hours has been in sinus rhythm. Echo shows EF of 60 to 65%.  Previous MD spoke with daughter Lauro Regulus no cardiac history. I will discontinue amiodarone and eliquis. Daughter is aware. -it appears To be an isolated episode of A. fib in the setting of severe sepsis on admission, with no recurrence, if he has any evidence of recurrence, then he will need long-term anticoagulation. -- primary care physician at follow-up to refer to cardiology if needed else can be managed by PCP. -- reviewed all records no mention of a fib . -Cardizem has been discontinued, metoprolol has been lowered to 12.5 mg twice daily for soft blood pressure.    Hypokalemia and hypomagnesemia Potassium low 2.9 today, repleted, monitor closely as he is back on Lasix   Thrombocytopenia Resolved  Bladder spasms -Resume oxybutynin   Hypertension - metoprolol and Cardizem   Fluid overload Right arm swelling right him swelling is improving. -IV Lasix daily x2 days-- urine output.  -He is with evidence of volume overload, resumed on 20 mg of Lasix daily -- creatinine stable at 0.5  Constipation -Resolved, no further nausea or vomiting    Procedures: Family communication : Tonya daughter on the phone 6//17 Consults : CODE STATUS: full DVT Prophylaxis : Lovenox Level of care: Progressive Cardiac Status is: Inpatient    Dispo: The patient is from: Home              Anticipated d/c is to: SNF               Patient currently is best optimized and medically stable to d/c.  Awaiting SNF bed availability.              Difficult to place patient No           TOTAL TIME TAKING CARE OF THIS PATIENT: 25 minutes.  >50% time spent on counselling and coordination of care  Note: This dictation was prepared with Dragon dictation along with smaller phrase technology. Any transcriptional errors that result from this process are unintentional.  Phillips Climes M.D    [facility]   CC: Primary  care physician; Jonetta Osgood, NP [name], male   DOB: 02/25/1956, 65 y.o.   MRN: [id number]

## 2020-10-12 NOTE — Plan of Care (Signed)
Problem: Education: Goal: Knowledge of General Education information will improve Description: Including pain rating scale, medication(s)/side effects and non-pharmacologic comfort measures 10/12/2020 1434 by Cristela Blue, RN Outcome: Progressing 10/12/2020 1434 by Cristela Blue, RN Outcome: Progressing   Problem: Health Behavior/Discharge Planning: Goal: Ability to manage health-related needs will improve 10/12/2020 1434 by Cristela Blue, RN Outcome: Progressing 10/12/2020 1434 by Cristela Blue, RN Outcome: Progressing   Problem: Clinical Measurements: Goal: Ability to maintain clinical measurements within normal limits will improve 10/12/2020 1434 by Cristela Blue, RN Outcome: Progressing 10/12/2020 1434 by Cristela Blue, RN Outcome: Progressing Goal: Will remain free from infection 10/12/2020 1434 by Cristela Blue, RN Outcome: Progressing 10/12/2020 1434 by Cristela Blue, RN Outcome: Progressing Goal: Diagnostic test results will improve 10/12/2020 1434 by Cristela Blue, RN Outcome: Progressing 10/12/2020 1434 by Cristela Blue, RN Outcome: Progressing Goal: Respiratory complications will improve 10/12/2020 1434 by Cristela Blue, RN Outcome: Progressing 10/12/2020 1434 by Cristela Blue, RN Outcome: Progressing Goal: Cardiovascular complication will be avoided 10/12/2020 1434 by Cristela Blue, RN Outcome: Progressing 10/12/2020 1434 by Cristela Blue, RN Outcome: Progressing   Problem: Activity: Goal: Risk for activity intolerance will decrease 10/12/2020 1434 by Cristela Blue, RN Outcome: Progressing 10/12/2020 1434 by Cristela Blue, RN Outcome: Progressing   Problem: Nutrition: Goal: Adequate nutrition will be maintained 10/12/2020 1434 by Cristela Blue, RN Outcome: Progressing 10/12/2020 1434 by Cristela Blue, RN Outcome: Progressing   Problem: Coping: Goal: Level of anxiety will decrease 10/12/2020 1434 by Cristela Blue, RN Outcome:  Progressing 10/12/2020 1434 by Cristela Blue, RN Outcome: Progressing   Problem: Elimination: Goal: Will not experience complications related to bowel motility 10/12/2020 1434 by Cristela Blue, RN Outcome: Progressing 10/12/2020 1434 by Cristela Blue, RN Outcome: Progressing Goal: Will not experience complications related to urinary retention 10/12/2020 1434 by Cristela Blue, RN Outcome: Progressing 10/12/2020 1434 by Cristela Blue, RN Outcome: Progressing   Problem: Pain Managment: Goal: General experience of comfort will improve 10/12/2020 1434 by Cristela Blue, RN Outcome: Progressing 10/12/2020 1434 by Cristela Blue, RN Outcome: Progressing   Problem: Safety: Goal: Ability to remain free from injury will improve 10/12/2020 1434 by Cristela Blue, RN Outcome: Progressing 10/12/2020 1434 by Cristela Blue, RN Outcome: Progressing   Problem: Skin Integrity: Goal: Risk for impaired skin integrity will decrease 10/12/2020 1434 by Cristela Blue, RN Outcome: Progressing 10/12/2020 1434 by Cristela Blue, RN Outcome: Progressing   Problem: Education: Goal: Knowledge of disease or condition will improve 10/12/2020 1434 by Cristela Blue, RN Outcome: Progressing 10/12/2020 1434 by Cristela Blue, RN Outcome: Progressing Goal: Understanding of medication regimen will improve 10/12/2020 1434 by Cristela Blue, RN Outcome: Progressing 10/12/2020 1434 by Cristela Blue, RN Outcome: Progressing Goal: Individualized Educational Video(s) 10/12/2020 1434 by Cristela Blue, RN Outcome: Progressing 10/12/2020 1434 by Cristela Blue, RN Outcome: Progressing   Problem: Activity: Goal: Ability to tolerate increased activity will improve 10/12/2020 1434 by Cristela Blue, RN Outcome: Progressing 10/12/2020 1434 by Cristela Blue, RN Outcome: Progressing   Problem: Cardiac: Goal: Ability to achieve and maintain adequate cardiopulmonary perfusion will improve 10/12/2020 1434 by Cristela Blue, RN Outcome: Progressing 10/12/2020 1434 by Cristela Blue, RN Outcome: Progressing   Problem: Health Behavior/Discharge Planning: Goal: Ability to safely manage health-related needs after discharge will improve 10/12/2020 1434 by Cristela Blue, RN Outcome: Progressing 10/12/2020 1434 by Cristela Blue, RN Outcome: Progressing   Problem: Urinary Elimination: Goal: Signs and symptoms of infection will decrease 10/12/2020 1434 by Cristela Blue, RN Outcome: Progressing 10/12/2020 1434 by Cristela Blue, RN Outcome: Progressing

## 2020-10-13 DIAGNOSIS — N390 Urinary tract infection, site not specified: Secondary | ICD-10-CM

## 2020-10-13 LAB — BASIC METABOLIC PANEL
Anion gap: 7 (ref 5–15)
BUN: 11 mg/dL (ref 8–23)
CO2: 31 mmol/L (ref 22–32)
Calcium: 8.3 mg/dL — ABNORMAL LOW (ref 8.9–10.3)
Chloride: 106 mmol/L (ref 98–111)
Creatinine, Ser: 0.51 mg/dL — ABNORMAL LOW (ref 0.61–1.24)
GFR, Estimated: >60 mL/min (ref >60)
Glucose, Bld: 83 mg/dL (ref 70–99)
Potassium: 2.9 mmol/L — ABNORMAL LOW (ref 3.5–5.1)
Sodium: 144 mmol/L (ref 135–145)

## 2020-10-13 LAB — CBC
HCT: 33.9 % — ABNORMAL LOW (ref 39.0–52.0)
Hemoglobin: 11.8 g/dL — ABNORMAL LOW (ref 13.0–17.0)
MCH: 38.6 pg — ABNORMAL HIGH (ref 26.0–34.0)
MCHC: 34.8 g/dL (ref 30.0–36.0)
MCV: 110.8 fL — ABNORMAL HIGH (ref 80.0–100.0)
Platelets: 169 10*3/uL (ref 150–400)
RBC: 3.06 MIL/uL — ABNORMAL LOW (ref 4.22–5.81)
RDW: 13.8 % (ref 11.5–15.5)
WBC: 7.5 10*3/uL (ref 4.0–10.5)
nRBC: 0 % (ref 0.0–0.2)

## 2020-10-13 MED ORDER — POTASSIUM CHLORIDE CRYS ER 20 MEQ PO TBCR
40.0000 meq | EXTENDED_RELEASE_TABLET | ORAL | Status: AC
  Administered 2020-10-13 (×3): 40 meq via ORAL
  Filled 2020-10-13 (×3): qty 2

## 2020-10-14 DIAGNOSIS — E876 Hypokalemia: Secondary | ICD-10-CM | POA: Diagnosis not present

## 2020-10-14 DIAGNOSIS — D309 Benign neoplasm of urinary organ, unspecified: Secondary | ICD-10-CM | POA: Diagnosis not present

## 2020-10-14 DIAGNOSIS — Z743 Need for continuous supervision: Secondary | ICD-10-CM | POA: Diagnosis not present

## 2020-10-14 DIAGNOSIS — L209 Atopic dermatitis, unspecified: Secondary | ICD-10-CM | POA: Diagnosis not present

## 2020-10-14 DIAGNOSIS — I10 Essential (primary) hypertension: Secondary | ICD-10-CM | POA: Diagnosis not present

## 2020-10-14 DIAGNOSIS — G9341 Metabolic encephalopathy: Secondary | ICD-10-CM | POA: Diagnosis not present

## 2020-10-14 DIAGNOSIS — L8990 Pressure ulcer of unspecified site, unspecified stage: Secondary | ICD-10-CM | POA: Insufficient documentation

## 2020-10-14 DIAGNOSIS — R279 Unspecified lack of coordination: Secondary | ICD-10-CM | POA: Diagnosis not present

## 2020-10-14 DIAGNOSIS — N390 Urinary tract infection, site not specified: Secondary | ICD-10-CM | POA: Diagnosis not present

## 2020-10-14 DIAGNOSIS — G9340 Encephalopathy, unspecified: Secondary | ICD-10-CM | POA: Diagnosis not present

## 2020-10-14 DIAGNOSIS — M6281 Muscle weakness (generalized): Secondary | ICD-10-CM | POA: Diagnosis not present

## 2020-10-14 DIAGNOSIS — D649 Anemia, unspecified: Secondary | ICD-10-CM | POA: Diagnosis not present

## 2020-10-14 DIAGNOSIS — N401 Enlarged prostate with lower urinary tract symptoms: Secondary | ICD-10-CM | POA: Diagnosis not present

## 2020-10-14 DIAGNOSIS — A419 Sepsis, unspecified organism: Secondary | ICD-10-CM | POA: Diagnosis not present

## 2020-10-14 LAB — BASIC METABOLIC PANEL
Anion gap: 3 — ABNORMAL LOW (ref 5–15)
BUN: 12 mg/dL (ref 8–23)
CO2: 30 mmol/L (ref 22–32)
Calcium: 7.9 mg/dL — ABNORMAL LOW (ref 8.9–10.3)
Chloride: 107 mmol/L (ref 98–111)
Creatinine, Ser: 0.48 mg/dL — ABNORMAL LOW (ref 0.61–1.24)
GFR, Estimated: >60 mL/min (ref >60)
Glucose, Bld: 77 mg/dL (ref 70–99)
Potassium: 3.2 mmol/L — ABNORMAL LOW (ref 3.5–5.1)
Sodium: 140 mmol/L (ref 135–145)

## 2020-10-14 LAB — SARS CORONAVIRUS 2 (TAT 6-24 HRS): SARS Coronavirus 2: NEGATIVE

## 2020-10-14 MED ORDER — POTASSIUM CHLORIDE CRYS ER 10 MEQ PO TBCR: 10.0000 meq | EXTENDED_RELEASE_TABLET | Freq: Every day | ORAL | Status: DC

## 2020-10-14 MED ORDER — METOPROLOL TARTRATE 25 MG PO TABS: 12.5000 mg | ORAL_TABLET | Freq: Two times a day (BID) | ORAL | Status: DC

## 2020-10-14 MED ORDER — POTASSIUM CHLORIDE CRYS ER 20 MEQ PO TBCR
40.0000 meq | EXTENDED_RELEASE_TABLET | Freq: Four times a day (QID) | ORAL | Status: DC
  Administered 2020-10-14: 40 meq via ORAL
  Filled 2020-10-14: qty 2

## 2020-10-14 MED ORDER — DOCUSATE SODIUM 100 MG PO CAPS: 100.0000 mg | ORAL_CAPSULE | Freq: Two times a day (BID) | ORAL | 0 refills | Status: DC | PRN

## 2020-10-14 MED ORDER — ACETAMINOPHEN 325 MG PO TABS: 650.0000 mg | ORAL_TABLET | Freq: Four times a day (QID) | ORAL | Status: DC | PRN

## 2020-10-14 NOTE — Care Management Important Message (Signed)
Important Message  Patient Details  Name: Gerald Odonnell MRN: [id number] Date of Birth: November 18, 1955   Medicare Important Message Given:  Yes     Dannette Barbara 10/14/2020, 11:53 AM

## 2020-10-14 NOTE — Discharge Summary (Signed)
Physician Discharge Summary  Gerald Odonnell NOB:[id number] DOB: 07-19-1955 DOA: 10/03/2020  PCP: Gerald Osgood, NP  Admit date: 10/03/2020 Discharge date: 10/14/2020  Admitted From Home Disposition:  SNF  Recommendations for Outpatient Follow-up:  Please check CBC, BMP in 3 days Please adjust Lasix dose as needed, he will be discharged on 20 mg oral twice daily Patient to follow with his oncologist Dr. Grayland Odonnell in July. Patient to continue with Zytiga, with his own home supply.  Home Health: No Equipment/Devices: No   Discharge Condition Stabl CODE STATUS:FULL Diet recommendation: Heart Healthy   Brief/Interim Summary:  [name] is a 65 y.o. M with metastatic prostate cancer on Zytiga, SP catheter, recurrent UTI, and HTN who presented with decreased mentation, fever.  Severe sepsis due to Enterococcus and Pseudomonas UTI due to SP catheter (chronic) --Presented with fever, tachypneaa, evidence of endorgan dysfunction, due to SP catheter, present on admission.  Had somnolence/confusion, qSOFA greater than 2, thrombocytopenia.  --SP catheter was changed on admission  -- Urine cultures growing Enterococcus faecalis and Pseudomonas -- switch to PO Cipro and amoxicillin, today would be the last day of treatment. -- clinically improving sepsis resolved -- patient follows with [facility] urology for his suprapubic catheter   Acute metabolic encephalopathy due to sepsis Presented with somnolence, at baseline he is alert and interactive lives independently.   -- sepsis resolved -- mentation back to baseline   history of metastatic prostate cancer - Continue prednisone and abiraterone (using pt's own medication) -- will follow-up with Dr. Grayland Odonnell on his scheduled appointment in July -Patient is chronically on prednisone 5 mg oral steroid-dependent.   Atrial fibrillation with RVR--new in the setting of severe sepsis now resolved - patient was initially started on diltiazem and  metoprolol. Amiodarone was added since patient remained in a fib initially however now 48 hours has been in sinus rhythm. Echo shows EF of 60 to 65%.  Previous MD spoke with daughter Gerald Odonnell no cardiac history. I will discontinue amiodarone and eliquis. Daughter is aware. -it appears To be an isolated episode of A. fib in the setting of severe sepsis on admission, with no recurrence, if he has any evidence of recurrence, then he will need long-term anticoagulation. -- primary care physician at follow-up to refer to cardiology if needed else can be managed by PCP. -- reviewed all records no mention of a fib . -Cardizem has been discontinued, metoprolol has been lowered to 12.5 mg twice daily for soft blood pressure.     Hypokalemia and hypomagnesemia Repleted, he will be discharged on potassium supplements, continue to monitor and replete as needed.   Thrombocytopenia Resolved   Bladder spasms -Resume oxybutynin   Hypertension - metoprolol    Fluid overload Right arm swelling right him swelling is improving. -IV Lasix daily x2 days-- urine output.  -He is with evidence of volume overload, resumed on 20 mg of Lasix daily -- creatinine stable at 0.5   Constipation -Resolved, no further nausea or vomiting    Discharge Diagnoses:  Principal Problem:   Acute metabolic encephalopathy Active Problems:   Benign prostatic hyperplasia with urinary obstruction   Sepsis secondary to UTI [facility])   Prostate cancer ([place])   Weakness   Fall   Hypokalemia   Sepsis ([place])   Lower urinary tract infectious disease   Pressure injury of skin    Discharge Instructions  Discharge Instructions     Diet - low sodium heart healthy   Complete by: As directed    Diet -  low sodium heart healthy   Complete by: As directed    Discharge wound care:   Complete by: As directed    Continue with wound care for buttocks right stage II   Increase activity slowly   Complete by: As directed    Increase  activity slowly   Complete by: As directed       Allergies as of 10/14/2020   No Known Allergies      Medication List     STOP taking these medications    ibuprofen 800 MG tablet Commonly known as: ADVIL   mirabegron ER 25 MG Tb24 tablet Commonly known as: MYRBETRIQ       TAKE these medications    abiraterone acetate 250 MG tablet Commonly known as: ZYTIGA TAKE 4 TABLETS (1,000 MG TOTAL) BY MOUTH DAILY. TAKE ON AN EMPTY STOMACH 1 HOUR BEFORE OR 2 HOURS AFTER A MEAL.   acetaminophen 325 MG tablet Commonly known as: TYLENOL Take 2 tablets (650 mg total) by mouth every 6 (six) hours as needed for mild pain or moderate pain (or Fever >/= 101).   amoxicillin 500 MG capsule Commonly known as: AMOXIL Take 1 capsule (500 mg total) by mouth every 8 (eight) hours for 12 doses.   ciprofloxacin 500 MG tablet Commonly known as: CIPRO Take 1 tablet (500 mg total) by mouth 2 (two) times daily for 8 doses.   diltiazem 240 MG 24 hr capsule Commonly known as: CARDIZEM CD Take 1 capsule (240 mg total) by mouth daily.   docusate sodium 100 MG capsule Commonly known as: COLACE Take 1 capsule (100 mg total) by mouth 2 (two) times daily as needed for mild constipation. What changed:  when to take this reasons to take this   furosemide 20 MG tablet Commonly known as: LASIX Take 1 tablet (20 mg total) by mouth 2 (two) times daily.   metoprolol tartrate 25 MG tablet Commonly known as: LOPRESSOR Take 0.5 tablets (12.5 mg total) by mouth 2 (two) times daily.   oxybutynin 5 MG tablet Commonly known as: DITROPAN Take 1 tablet (5 mg total) by mouth 4 (four) times daily.   potassium chloride 10 MEQ tablet Commonly known as: KLOR-CON Take 1 tablet (10 mEq total) by mouth daily. Start taking on: October 15, 2020 What changed:  medication strength how much to take when to take this   predniSONE 5 MG tablet Commonly known as: DELTASONE TAKE 1 TABLET BY MOUTH EVERYDAY WITH  BREAKFAST   senna 8.6 MG Tabs tablet Commonly known as: SENOKOT Take 1 tablet by mouth daily.   Vitamin D 125 MCG (5000 UT) Caps Take 1 capsule by mouth daily.               Discharge Care Instructions  (From admission, onward)           Start     Ordered   10/14/20 0000  Discharge wound care:       Comments: Continue with wound care for buttocks right stage II   10/14/20 1006            Contact information for follow-up providers     Gerald Osgood, NP Follow up in 1 week(s).   Specialty: Nurse Practitioner Why: hosp f/u Contact information: [place] [address] [phone]         Lloyd Huger, MD Follow up.   Specialty: Oncology Why: on your scheduled appt in July Contact information: [place]  [address] [phone]              Contact information for after-discharge care     Destination     [facility] Preferred SNF .   Service: Skilled Nursing Contact information: [address] [place] [phone]                    No Known Allergies  Consultations: None   Procedures/Studies: CT Head W or Wo Contrast  Result Date: 10/03/2020 CLINICAL DATA:  Fall, metastatic prostate cancer EXAM: CT HEAD WITHOUT AND WITH CONTRAST TECHNIQUE: Contiguous axial images were obtained from the base of the skull through the vertex without and with intravenous contrast CONTRAST:  27mL OMNIPAQUE IOHEXOL 350 MG/ML SOLN COMPARISON:  Most recent prior head CT from 2012 FINDINGS: Brain: There is no acute intracranial hemorrhage, mass, mass effect, or edema. No abnormal enhancement. Gray-white differentiation is preserved. There is no extra-axial fluid collection. Prominence of the ventricles and sulci reflects generalized parenchymal volume loss. Patchy and confluent areas of hypoattenuation in the supratentorial white matter are nonspecific but probably  reflect moderate chronic microvascular ischemic changes. Small area of right temporal encephalomalacia along the mastoid. There are chronic small vessel infarcts of the left caudate and frontal periventricular white matter. Vascular: There is atherosclerotic calcification at the skull base. Skull: Sclerotic foci are present throughout the calvarium and skull base likely reflecting metastases. Chronic nasal bone fractures Sinuses/Orbits: No acute finding. Other: None. IMPRESSION: No acute intracranial abnormality. Chronic/nonemergent findings detailed above. Sclerotic osseous metastatic disease. Electronically Signed   By: Macy Mis M.D.   On: 10/03/2020 12:34   CT Angio Chest PE W/Cm &/Or Wo Cm  Result Date: 10/03/2020 CLINICAL DATA:  Back pain after fall last night. History of metastatic prostate cancer. EXAM: CT ANGIOGRAPHY CHEST WITH CONTRAST TECHNIQUE: Multidetector CT imaging of the chest was performed using the standard protocol during bolus administration of intravenous contrast. Multiplanar CT image reconstructions and MIPs were obtained to evaluate the vascular anatomy. CONTRAST:  41mL OMNIPAQUE IOHEXOL 350 MG/ML SOLN COMPARISON:  Aug 31, 2018. FINDINGS: Cardiovascular: Satisfactory opacification of the pulmonary arteries to the segmental level. No evidence of pulmonary embolism. Normal heart size. No pericardial effusion. Atherosclerosis of thoracic aorta is noted without aneurysm formation. Coronary artery calcifications are noted. Mediastinum/Nodes: No enlarged mediastinal, hilar, or axillary lymph nodes. Thyroid gland, trachea, and esophagus demonstrate no significant findings. Lungs/Pleura: No pneumothorax or pleural effusion is noted. Mild emphysematous disease is noted in the upper lobes. Minimal bibasilar subsegmental atelectasis is noted. Upper Abdomen: Probable hepatic cirrhosis. Stable right adrenal adenoma. Musculoskeletal: Diffuse sclerotic densities are noted throughout the visualized  spine and ribs consistent with metastatic disease. No fracture is noted. Review of the MIP images confirms the above findings. IMPRESSION: No definite evidence of pulmonary embolus. Minimal bibasilar subsegmental atelectasis. Coronary artery calcifications are noted. Hepatic cirrhosis. Diffuse sclerotic densities are noted throughout the visualized spine and ribs consistent with osseous metastases. Aortic Atherosclerosis (ICD10-I70.0) and Emphysema (ICD10-J43.9). Electronically Signed   By: [name] M.D.   On: 10/03/2020 12:46   DG Chest Port 1 View  Result Date: 10/12/2020 CLINICAL DATA:  Vomiting.  Recent sepsis EXAM: PORTABLE CHEST 1 VIEW COMPARISON:  10/04/2020 FINDINGS: Moderate bibasilar airspace disease. Small bilateral effusions. This has progressed significantly in the interval. Cardiac enlargement with vascular congestion. IMPRESSION: Vascular congestion. Progressive bibasilar atelectasis and effusion. Possible fluid overload versus pneumonia. Electronically Signed   By: Franchot Gallo  M.D.   On: 10/12/2020 11:31   DG Chest Port 1 View  Result Date: 10/04/2020 CLINICAL DATA:  Chest pain EXAM: PORTABLE CHEST 1 VIEW COMPARISON:  Radiograph 10/03/2020, CT 10/03/2020 FINDINGS: Unchanged cardiomediastinal silhouette. Unchanged elevated left hemidiaphragm with left basilar atelectasis. There is also right basilar atelectasis. There is no new focal airspace disease. No large pleural effusion or visible pneumothorax. Diffuse osseous sclerotic lesions are again seen. IMPRESSION: No new focal airspace disease. Unchanged elevated left hemidiaphragm and basilar subsegmental atelectasis. Diffuse sclerotic osseous metastatic disease again seen. Electronically Signed   By: Maurine Simmering   On: 10/04/2020 10:40   DG Chest Port 1 View  Result Date: 10/03/2020 CLINICAL DATA:  Fever Chest pain Shortness of breath EXAM: PORTABLE CHEST 1 VIEW COMPARISON:  08/17/2016 FINDINGS: Heart size within normal limits.  Unchanged elevation of the left hemidiaphragm. Diffuse osseous metastatic disease again seen. Lungs are clear. IMPRESSION: No acute cardiopulmonary process. Electronically Signed   By: Miachel Roux M.D.   On: 10/03/2020 08:54   DG Abd Portable 1V  Result Date: 10/12/2020 CLINICAL DATA:  Patient with vomiting and abdominal pain. History of colon cancer. EXAM: PORTABLE ABDOMEN - 1 VIEW COMPARISON:  CT abdomen pelvis 05/15/2020. FINDINGS: There is gaseous distension of the colon particularly the cecum and transverse colon. No significant small bowel gaseous distension. Lumbar spine degenerative changes. IMPRESSION: Nonspecific bowel gas pattern with gaseous distension of the ascending and transverse colon. Electronically Signed   By: Lovey Newcomer M.D.   On: 10/12/2020 12:53   ECHOCARDIOGRAM COMPLETE  Result Date: 10/07/2020    ECHOCARDIOGRAM REPORT   Patient Name:   Kage K Odonnell Date of Exam: 10/06/2020 Medical Rec #:  [id number]       Height:       70.0 in Accession #:    [id number]      Weight:       265.0 lb Date of Birth:  04-25-1956       BSA:          2.353 m Patient Age:    70 years        BP:           109/81 mmHg Patient Gender: M               HR:           147 bpm. Exam Location:  [facility] Procedure: 2D Echo Indications:     Dyspnea R06.00  History:         Patient has no prior history of Echocardiogram examinations.  Sonographer:     Kathlen Brunswick RDCS Referring Phys:  [id number] Suann Larry DANFORD Diagnosing Phys: Serafina Royals MD  Sonographer Comments: Technically difficult study due to poor echo windows, patient is morbidly obese and suboptimal subcostal window. Image acquisition challenging due to patient body habitus. IMPRESSIONS  1. Left ventricular ejection fraction, by estimation, is 60 to 65%. The left ventricle has normal function. The left ventricle has no regional wall motion abnormalities. Left ventricular diastolic parameters were normal.  2. Right ventricular systolic function is  normal. The right ventricular size is normal.  3. The mitral valve is normal in structure. Trivial mitral valve regurgitation.  4. The aortic valve is normal in structure. Aortic valve regurgitation is not visualized. FINDINGS  Left Ventricle: Left ventricular ejection fraction, by estimation, is 60 to 65%. The left ventricle has normal function. The left ventricle has no regional wall motion abnormalities. The left ventricular internal cavity size  was small. There is no left ventricular hypertrophy. Left ventricular diastolic parameters were normal. Right Ventricle: The right ventricular size is normal. No increase in right ventricular wall thickness. Right ventricular systolic function is normal. Left Atrium: Left atrial size was normal in size. Right Atrium: Right atrial size was normal in size. Pericardium: There is no evidence of pericardial effusion. Mitral Valve: The mitral valve is normal in structure. Trivial mitral valve regurgitation. Tricuspid Valve: The tricuspid valve is normal in structure. Tricuspid valve regurgitation is trivial. Aortic Valve: The aortic valve is normal in structure. Aortic valve regurgitation is not visualized. Aortic valve peak gradient measures 6.7 mmHg. Pulmonic Valve: The pulmonic valve was normal in structure. Pulmonic valve regurgitation is not visualized. Aorta: The aortic root and ascending aorta are structurally normal, with no evidence of dilitation. IAS/Shunts: No atrial level shunt detected by color flow Doppler.  LEFT VENTRICLE PLAX 2D LVIDd:         3.59 cm  Diastology LVIDs:         2.55 cm  LV e' medial:    16.50 cm/s LV PW:         1.55 cm  LV E/e' medial:  6.2 LV IVS:        1.54 cm  LV e' lateral:   18.90 cm/s LVOT diam:     2.20 cm  LV E/e' lateral: 5.4 LV SV:         76 LV SV Index:   32 LVOT Area:     3.80 cm  LEFT ATRIUM            Index       RIGHT ATRIUM           Index LA diam:      4.60 cm  1.96 cm/m  RA Area:     20.40 cm LA Vol (A2C): 60.2 ml  25.59  ml/m RA Volume:   63.80 ml  27.12 ml/m LA Vol (A4C): 121.0 ml 51.43 ml/m  AORTIC VALVE AV Area (Vmax): 3.36 cm AV Vmax:        129.00 cm/s AV Peak Grad:   6.7 mmHg LVOT Vmax:      114.00 cm/s LVOT Vmean:     86.500 cm/s LVOT VTI:       0.200 m  AORTA Ao Root diam: 3.30 cm MITRAL VALVE MV Area (PHT): 4.93 cm     SHUNTS MV Decel Time: 154 msec     Systemic VTI:  0.20 m MV E velocity: 103.00 cm/s  Systemic Diam: 2.20 cm Serafina Royals MD Electronically signed by Serafina Royals MD Signature Date/Time: 10/07/2020/8:28:35 AM    Final       Subjective: Patient denies any complaints today, he reports bowel movement yesterday and this morning after receiving laxatives.  Discharge Exam: Vitals:   10/13/20 1608 10/13/20 1928  BP: 136/75 140/64  Pulse: 69 71  Resp: 19 16  Temp: 97.8 F (36.6 C) 98.5 F (36.9 C)  SpO2: 97% 100%   Vitals:   10/13/20 0800 10/13/20 1100 10/13/20 1608 10/13/20 1928  BP: 140/63 (!) 141/68 136/75 140/64  Pulse: 70 70 69 71  Resp: 20 20 19 16   Temp: 98.5 F (36.9 C) 98.4 F (36.9 C) 97.8 F (36.6 C) 98.5 F (36.9 C)  TempSrc: Oral Oral Oral   SpO2: 97%  97% 100%  Weight:      Height:        General: Pt is alert, awake, not in acute  distress, frail, deconditioned Cardiovascular: RRR, S1/S2 +, no rubs, no gallops Respiratory: CTA bilaterally, no wheezing, no rhonchi Abdominal: Soft, NT, ND, bowel sounds +, suprapubic Foley present Extremities: +1 edema bilaterally, and chronic venous stasis skin changes.    The results of significant diagnostics from this hospitalization (including imaging, microbiology, ancillary and laboratory) are listed below for reference.     Microbiology: Recent Results (from the past 240 hour(s))  Urine Culture     Status: Abnormal   Collection Time: 10/05/20  7:35 AM   Specimen: Urine, Random  Result Value Ref Range Status   Specimen Description   Final    URINE, RANDOM Performed at [facility], [address] [place], [place] [address]    Special Requests   Final    NONE Performed at [facility], [place], [place] [address]    Culture (A)  Final    30,000 COLONIES/mL ENTEROCOCCUS FAECALIS 20,000 COLONIES/mL PSEUDOMONAS AERUGINOSA    Report Status 10/08/2020 FINAL  Final   Organism ID, Bacteria ENTEROCOCCUS FAECALIS (A)  Final   Organism ID, Bacteria PSEUDOMONAS AERUGINOSA (A)  Final      Susceptibility   Enterococcus faecalis - MIC*    AMPICILLIN <=2 SENSITIVE Sensitive     NITROFURANTOIN <=16 SENSITIVE Sensitive     VANCOMYCIN 1 SENSITIVE Sensitive     * 30,000 COLONIES/mL ENTEROCOCCUS FAECALIS   Pseudomonas aeruginosa - MIC*    CEFTAZIDIME 4 SENSITIVE Sensitive     CIPROFLOXACIN <=0.25 SENSITIVE Sensitive     GENTAMICIN <=1 SENSITIVE Sensitive     IMIPENEM 2 SENSITIVE Sensitive     PIP/TAZO 8 SENSITIVE Sensitive     CEFEPIME 2 SENSITIVE Sensitive     * 20,000 COLONIES/mL PSEUDOMONAS AERUGINOSA  SARS CORONAVIRUS 2 (TAT 6-24 HRS) Nasopharyngeal Nasopharyngeal Swab     Status: None   Collection Time: 10/13/20 11:54 AM   Specimen: Nasopharyngeal Swab  Result Value Ref Range Status   SARS Coronavirus 2 NEGATIVE NEGATIVE Final    Comment: (NOTE) SARS-CoV-2 target nucleic acids are NOT DETECTED.  The SARS-CoV-2 RNA is generally detectable in upper and lower respiratory specimens during the acute phase of infection. Negative results do not preclude SARS-CoV-2 infection, do not rule out co-infections with other pathogens, and should not be used as the sole basis for treatment or other patient management decisions. Negative results must be combined with clinical observations, patient history, and epidemiological information. The expected result is Negative.  Fact Sheet for Patients: SugarRoll.be  Fact Sheet for Healthcare Providers: https://www.woods-mathews.com/  This test is not yet approved or cleared by the  [place] FDA and  has been authorized for detection and/or diagnosis of SARS-CoV-2 by FDA under an Emergency Use Authorization (EUA). This EUA will remain  in effect (meaning this test can be used) for the duration of the COVID-19 declaration under Se ction 564(b)(1) of the Act, 21 U.S.C. section 360bbb-3(b)(1), unless the authorization is terminated or revoked sooner.  Performed at [facility], [place] [address] [place], [place] [address]      Labs: BNP (last 3 results) Recent Labs    10/03/20 0805  BNP 628.3*   Basic Metabolic Panel: Recent Labs  Lab 10/08/20 0521 10/09/20 0626 10/10/20 0435 10/13/20 0653 10/14/20 0458  NA 141 143  --  144 140  K 4.7 3.0*  --  2.9* 3.2*  CL 110 105  --  106 107  CO2 26 26  --  31 30  GLUCOSE 67* 68*  --  83 77  BUN 11 11  --  11 12  CREATININE 0.61 0.58* 0.46* 0.51* 0.48*  CALCIUM 8.7* 8.2*  --  8.3* 7.9*   Liver Function Tests: No results for input(s): AST, ALT, ALKPHOS, BILITOT, PROT, ALBUMIN in the last 168 hours. No results for input(s): LIPASE, AMYLASE in the last 168 hours. No results for input(s): AMMONIA in the last 168 hours. CBC: Recent Labs  Lab 10/08/20 0521 10/09/20 0626 10/13/20 0653  WBC 7.4 8.0 7.5  HGB 11.3* 11.7* 11.8*  HCT 32.4* 33.3* 33.9*  MCV 111.0* 108.1* 110.8*  PLT 122* 155 169   Cardiac Enzymes: No results for input(s): CKTOTAL, CKMB, CKMBINDEX, TROPONINI in the last 168 hours. BNP: Invalid input(s): POCBNP CBG: No results for input(s): GLUCAP in the last 168 hours. D-Dimer No results for input(s): DDIMER in the last 72 hours. Hgb A1c No results for input(s): HGBA1C in the last 72 hours. Lipid Profile No results for input(s): CHOL, HDL, LDLCALC, TRIG, CHOLHDL, LDLDIRECT in the last 72 hours. Thyroid function studies No results for input(s): TSH, T4TOTAL, T3FREE, THYROIDAB in the last 72 hours.  Invalid input(s): FREET3 Anemia work up No results for input(s): VITAMINB12,  FOLATE, FERRITIN, TIBC, IRON, RETICCTPCT in the last 72 hours. Urinalysis    Component Value Date/Time   COLORURINE AMBER (A) 10/03/2020 1100   APPEARANCEUR HAZY (A) 10/03/2020 1100   APPEARANCEUR Clear 01/17/2020 0912   LABSPEC 1.015 10/03/2020 1100   LABSPEC 1.002 04/15/2014 1852   PHURINE 7.0 10/03/2020 1100   GLUCOSEU NEGATIVE 10/03/2020 1100   GLUCOSEU Negative 04/15/2014 1852   HGBUR SMALL (A) 10/03/2020 1100   BILIRUBINUR NEGATIVE 10/03/2020 1100   BILIRUBINUR neg 03/18/2020 1456   BILIRUBINUR Negative 01/17/2020 0912   BILIRUBINUR Negative 04/15/2014 1852   KETONESUR 5 (A) 10/03/2020 1100   PROTEINUR 30 (A) 10/03/2020 1100   UROBILINOGEN 0.2 03/18/2020 1456   NITRITE POSITIVE (A) 10/03/2020 1100   LEUKOCYTESUR MODERATE (A) 10/03/2020 1100   LEUKOCYTESUR Negative 04/15/2014 1852   Sepsis Labs Invalid input(s): PROCALCITONIN,  WBC,  LACTICIDVEN Microbiology Recent Results (from the past 240 hour(s))  Urine Culture     Status: Abnormal   Collection Time: 10/05/20  7:35 AM   Specimen: Urine, Random  Result Value Ref Range Status   Specimen Description   Final    URINE, RANDOM Performed at [facility], [address], [place], [place] [address]    Special Requests   Final    NONE Performed at [facility], [place], [place] [address]    Culture (A)  Final    30,000 COLONIES/mL ENTEROCOCCUS FAECALIS 20,000 COLONIES/mL PSEUDOMONAS AERUGINOSA    Report Status 10/08/2020 FINAL  Final   Organism ID, Bacteria ENTEROCOCCUS FAECALIS (A)  Final   Organism ID, Bacteria PSEUDOMONAS AERUGINOSA (A)  Final      Susceptibility   Enterococcus faecalis - MIC*    AMPICILLIN <=2 SENSITIVE Sensitive     NITROFURANTOIN <=16 SENSITIVE Sensitive     VANCOMYCIN 1 SENSITIVE Sensitive     * 30,000 COLONIES/mL ENTEROCOCCUS FAECALIS   Pseudomonas aeruginosa - MIC*    CEFTAZIDIME 4 SENSITIVE Sensitive     CIPROFLOXACIN <=0.25 SENSITIVE Sensitive      GENTAMICIN <=1 SENSITIVE Sensitive     IMIPENEM 2 SENSITIVE Sensitive     PIP/TAZO 8 SENSITIVE Sensitive     CEFEPIME 2 SENSITIVE Sensitive     * 20,000 COLONIES/mL PSEUDOMONAS AERUGINOSA  SARS  CORONAVIRUS 2 (TAT 6-24 HRS) Nasopharyngeal Nasopharyngeal Swab     Status: None   Collection Time: 10/13/20 11:54 AM   Specimen: Nasopharyngeal Swab  Result Value Ref Range Status   SARS Coronavirus 2 NEGATIVE NEGATIVE Final    Comment: (NOTE) SARS-CoV-2 target nucleic acids are NOT DETECTED.  The SARS-CoV-2 RNA is generally detectable in upper and lower respiratory specimens during the acute phase of infection. Negative results do not preclude SARS-CoV-2 infection, do not rule out co-infections with other pathogens, and should not be used as the sole basis for treatment or other patient management decisions. Negative results must be combined with clinical observations, patient history, and epidemiological information. The expected result is Negative.  Fact Sheet for Patients: SugarRoll.be  Fact Sheet for Healthcare Providers: [email or web address][email or web address]  This test is not yet approved or cleared by the [place] FDA and  has been authorized for detection and/or diagnosis of SARS-CoV-2 by FDA under an Emergency Use Authorization (EUA). This EUA will remain  in effect (meaning this test can be used) for the duration of the COVID-19 declaration under Se ction 564(b)(1) of the Act, 21 U.S.C. section 360bbb-3(b)(1), unless the authorization is terminated or revoked sooner.  Performed at [facility], [place] [address], [place], [place] [address]      Time coordinating discharge: Over 30 minutes  SIGNED:   Phillips Climes, MD  [facility] 10/14/2020, 10:09 AM Pager   If 7PM-7AM, please contact night-coverage [email or web address] Password [email or web address]

## 2020-10-14 NOTE — TOC Transition Note (Signed)
Transition of Care [facility]) - CM/SW Discharge Note   Patient Details  Name: Gerald Odonnell MRN: [id number] Date of Birth: 09/22/1955  Transition of Care [facility]) CM/SW Contact:  Alberteen Sam, LCSW Phone Number: 10/14/2020, 10:11 AM   Clinical Narrative:     Patient will DC to: [facility] Anticipated DC date: 10/14/20 Family notified: Lauro [facility] [id number]  Per MD patient ready for DC to [facility]. RN, patient, patient's family, and facility notified of DC. Discharge Summary sent to facility. RN given number for report [phone] Room 702 . DC packet on chart. Ambulance transport requested for patient.  CSW signing off.  Pricilla Riffle, LCSW    Final next level of care: Skilled Nursing Facility Barriers to Discharge: No Barriers Identified   Patient Goals and CMS Choice Patient states their goals for this hospitalization and ongoing recovery are:: to go to SNF CMS Medicare.gov Compare Post Acute Care list provided to:: Patient Choice offered to / list presented to : Patient  Discharge Placement              Patient chooses bed at: [facility] [place] Patient to be transferred to facility by: [facility] Name of family member notified: Tonia Patient and family notified of of transfer: 10/14/20  Discharge Plan and Services In-house Referral: NA   Post Acute Care Choice: [place]                               Social Determinants of Health (SDOH) Interventions     Readmission Risk Interventions No flowsheet data found.

## 2020-10-14 NOTE — Progress Notes (Signed)
D/c telemetry and piv.  [facility]ed report to [facility]   Cancer medication   abiraterone acetate returned to patient with 27 pills in bottle  Patient taken by EMS

## 2020-10-15 DIAGNOSIS — D309 Benign neoplasm of urinary organ, unspecified: Secondary | ICD-10-CM | POA: Diagnosis not present

## 2020-10-15 DIAGNOSIS — I10 Essential (primary) hypertension: Secondary | ICD-10-CM | POA: Diagnosis not present

## 2020-10-15 DIAGNOSIS — E876 Hypokalemia: Secondary | ICD-10-CM | POA: Diagnosis not present

## 2020-10-15 DIAGNOSIS — N390 Urinary tract infection, site not specified: Secondary | ICD-10-CM | POA: Diagnosis not present

## 2020-10-17 ENCOUNTER — Telehealth: Payer: Self-pay | Admitting: Oncology

## 2020-10-17 NOTE — Telephone Encounter (Signed)
Dawn at [employer] called to confirm patients appts on 7/19.  She states that if the patient is still with them they will call back and cancel the transportation appointment as he will receive transportation from him.

## 2020-10-18 DIAGNOSIS — D309 Benign neoplasm of urinary organ, unspecified: Secondary | ICD-10-CM | POA: Diagnosis not present

## 2020-10-18 DIAGNOSIS — I10 Essential (primary) hypertension: Secondary | ICD-10-CM | POA: Diagnosis not present

## 2020-10-18 DIAGNOSIS — N390 Urinary tract infection, site not specified: Secondary | ICD-10-CM | POA: Diagnosis not present

## 2020-10-22 DIAGNOSIS — L209 Atopic dermatitis, unspecified: Secondary | ICD-10-CM | POA: Diagnosis not present

## 2020-10-22 DIAGNOSIS — N390 Urinary tract infection, site not specified: Secondary | ICD-10-CM | POA: Diagnosis not present

## 2020-10-22 DIAGNOSIS — I10 Essential (primary) hypertension: Secondary | ICD-10-CM | POA: Diagnosis not present

## 2020-10-22 DIAGNOSIS — D309 Benign neoplasm of urinary organ, unspecified: Secondary | ICD-10-CM | POA: Diagnosis not present

## 2020-10-22 DIAGNOSIS — E876 Hypokalemia: Secondary | ICD-10-CM | POA: Diagnosis not present

## 2020-10-24 ENCOUNTER — Other Ambulatory Visit (HOSPITAL_COMMUNITY): Payer: Self-pay

## 2020-10-24 DIAGNOSIS — D309 Benign neoplasm of urinary organ, unspecified: Secondary | ICD-10-CM | POA: Diagnosis not present

## 2020-10-24 DIAGNOSIS — N390 Urinary tract infection, site not specified: Secondary | ICD-10-CM | POA: Diagnosis not present

## 2020-10-24 DIAGNOSIS — E876 Hypokalemia: Secondary | ICD-10-CM | POA: Diagnosis not present

## 2020-10-24 DIAGNOSIS — I10 Essential (primary) hypertension: Secondary | ICD-10-CM | POA: Diagnosis not present

## 2020-10-24 DIAGNOSIS — M6281 Muscle weakness (generalized): Secondary | ICD-10-CM | POA: Diagnosis not present

## 2020-10-29 ENCOUNTER — Other Ambulatory Visit (HOSPITAL_COMMUNITY): Payer: Self-pay

## 2020-10-29 DIAGNOSIS — I10 Essential (primary) hypertension: Secondary | ICD-10-CM | POA: Diagnosis not present

## 2020-10-29 DIAGNOSIS — N390 Urinary tract infection, site not specified: Secondary | ICD-10-CM | POA: Diagnosis not present

## 2020-10-29 DIAGNOSIS — L209 Atopic dermatitis, unspecified: Secondary | ICD-10-CM | POA: Diagnosis not present

## 2020-10-29 DIAGNOSIS — D309 Benign neoplasm of urinary organ, unspecified: Secondary | ICD-10-CM | POA: Diagnosis not present

## 2020-10-30 ENCOUNTER — Other Ambulatory Visit (HOSPITAL_COMMUNITY): Payer: Self-pay

## 2020-10-30 ENCOUNTER — Other Ambulatory Visit: Payer: Self-pay | Admitting: Oncology

## 2020-10-30 DIAGNOSIS — C61 Malignant neoplasm of prostate: Secondary | ICD-10-CM

## 2020-10-31 ENCOUNTER — Ambulatory Visit: Payer: Medicare HMO | Admitting: Oncology

## 2020-10-31 ENCOUNTER — Other Ambulatory Visit: Payer: Medicare HMO

## 2020-10-31 DIAGNOSIS — D309 Benign neoplasm of urinary organ, unspecified: Secondary | ICD-10-CM | POA: Diagnosis not present

## 2020-10-31 DIAGNOSIS — E876 Hypokalemia: Secondary | ICD-10-CM | POA: Diagnosis not present

## 2020-10-31 DIAGNOSIS — I10 Essential (primary) hypertension: Secondary | ICD-10-CM | POA: Diagnosis not present

## 2020-10-31 DIAGNOSIS — N390 Urinary tract infection, site not specified: Secondary | ICD-10-CM | POA: Diagnosis not present

## 2020-10-31 DIAGNOSIS — M6281 Muscle weakness (generalized): Secondary | ICD-10-CM | POA: Diagnosis not present

## 2020-11-01 ENCOUNTER — Other Ambulatory Visit (HOSPITAL_COMMUNITY): Payer: Self-pay

## 2020-11-01 MED ORDER — PREDNISONE 5 MG PO TABS
ORAL_TABLET | ORAL | 3 refills | Status: DC
  Filled 2020-11-01: qty 30, 30d supply, fill #0

## 2020-11-01 MED ORDER — ABIRATERONE ACETATE 250 MG PO TABS
ORAL_TABLET | Freq: Every day | ORAL | 3 refills | Status: DC
  Filled 2020-11-01: qty 120, fill #0
  Filled 2020-11-07: qty 120, 30d supply, fill #0

## 2020-11-02 ENCOUNTER — Other Ambulatory Visit (HOSPITAL_COMMUNITY): Payer: Self-pay

## 2020-11-04 ENCOUNTER — Other Ambulatory Visit (HOSPITAL_COMMUNITY): Payer: Self-pay

## 2020-11-04 DIAGNOSIS — E559 Vitamin D deficiency, unspecified: Secondary | ICD-10-CM | POA: Diagnosis not present

## 2020-11-04 DIAGNOSIS — N390 Urinary tract infection, site not specified: Secondary | ICD-10-CM | POA: Diagnosis not present

## 2020-11-04 DIAGNOSIS — E876 Hypokalemia: Secondary | ICD-10-CM | POA: Diagnosis not present

## 2020-11-04 DIAGNOSIS — I4891 Unspecified atrial fibrillation: Secondary | ICD-10-CM | POA: Diagnosis not present

## 2020-11-04 DIAGNOSIS — D309 Benign neoplasm of urinary organ, unspecified: Secondary | ICD-10-CM | POA: Diagnosis not present

## 2020-11-04 DIAGNOSIS — E8770 Fluid overload, unspecified: Secondary | ICD-10-CM | POA: Diagnosis not present

## 2020-11-04 DIAGNOSIS — I10 Essential (primary) hypertension: Secondary | ICD-10-CM | POA: Diagnosis not present

## 2020-11-04 DIAGNOSIS — M6281 Muscle weakness (generalized): Secondary | ICD-10-CM | POA: Diagnosis not present

## 2020-11-06 ENCOUNTER — Telehealth: Payer: Self-pay | Admitting: Oncology

## 2020-11-06 NOTE — Telephone Encounter (Signed)
Patient just discharge from the hospital and would like a few weeks to recover. 7/19 appointments rescheduled to 8/2.  Patient confirmed and reminder mailed.

## 2020-11-07 ENCOUNTER — Other Ambulatory Visit (HOSPITAL_COMMUNITY): Payer: Self-pay

## 2020-11-08 ENCOUNTER — Telehealth: Payer: Self-pay

## 2020-11-08 DIAGNOSIS — G9341 Metabolic encephalopathy: Secondary | ICD-10-CM | POA: Diagnosis not present

## 2020-11-08 DIAGNOSIS — N390 Urinary tract infection, site not specified: Secondary | ICD-10-CM | POA: Diagnosis not present

## 2020-11-08 DIAGNOSIS — R531 Weakness: Secondary | ICD-10-CM | POA: Diagnosis not present

## 2020-11-08 DIAGNOSIS — T83518D Infection and inflammatory reaction due to other urinary catheter, subsequent encounter: Secondary | ICD-10-CM | POA: Diagnosis not present

## 2020-11-08 DIAGNOSIS — I4891 Unspecified atrial fibrillation: Secondary | ICD-10-CM | POA: Diagnosis not present

## 2020-11-08 DIAGNOSIS — N401 Enlarged prostate with lower urinary tract symptoms: Secondary | ICD-10-CM | POA: Diagnosis not present

## 2020-11-08 DIAGNOSIS — A419 Sepsis, unspecified organism: Secondary | ICD-10-CM | POA: Diagnosis not present

## 2020-11-08 DIAGNOSIS — R339 Retention of urine, unspecified: Secondary | ICD-10-CM | POA: Diagnosis not present

## 2020-11-08 DIAGNOSIS — C61 Malignant neoplasm of prostate: Secondary | ICD-10-CM | POA: Diagnosis not present

## 2020-11-08 NOTE — Telephone Encounter (Signed)
Called patient to see how he has been since his discharged from the hospital and to set him up for a hospital follow up but there was no answer or voicemail set up.LNB

## 2020-11-08 NOTE — Telephone Encounter (Signed)
Gerald Odonnell [phone]) called from [facility]) [place] asking for verbal orders.  Pt has a superpubic catheter and has pain in the bladder area at a 9 out of 10 and was just discharged from the hospital.  Gerald Odonnell is asking for verbal orders for nursing evaluation and for PT 1 x week for 4 weeks.  I gave the approval for the PT and Nursing orders.

## 2020-11-09 ENCOUNTER — Inpatient Hospital Stay
Admission: EM | Admit: 2020-11-09 | Discharge: 2020-11-13 | DRG: 698 | Disposition: A | Discharge status: Hospice | Payer: Medicare HMO | Attending: Obstetrics and Gynecology | Admitting: Obstetrics and Gynecology

## 2020-11-09 ENCOUNTER — Encounter: Payer: Self-pay | Admitting: Emergency Medicine

## 2020-11-09 ENCOUNTER — Other Ambulatory Visit: Payer: Self-pay

## 2020-11-09 ENCOUNTER — Emergency Department: Payer: Medicare HMO

## 2020-11-09 DIAGNOSIS — N390 Urinary tract infection, site not specified: Secondary | ICD-10-CM | POA: Diagnosis present

## 2020-11-09 DIAGNOSIS — Z8249 Family history of ischemic heart disease and other diseases of the circulatory system: Secondary | ICD-10-CM | POA: Diagnosis not present

## 2020-11-09 DIAGNOSIS — Z9981 Dependence on supplemental oxygen: Secondary | ICD-10-CM | POA: Diagnosis not present

## 2020-11-09 DIAGNOSIS — Z20822 Contact with and (suspected) exposure to covid-19: Secondary | ICD-10-CM | POA: Diagnosis present

## 2020-11-09 DIAGNOSIS — Z66 Do not resuscitate: Secondary | ICD-10-CM | POA: Diagnosis present

## 2020-11-09 DIAGNOSIS — K766 Portal hypertension: Secondary | ICD-10-CM | POA: Diagnosis present

## 2020-11-09 DIAGNOSIS — R000 Tachycardia, unspecified: Secondary | ICD-10-CM | POA: Diagnosis not present

## 2020-11-09 DIAGNOSIS — R1111 Vomiting without nausea: Secondary | ICD-10-CM | POA: Diagnosis not present

## 2020-11-09 DIAGNOSIS — I2510 Atherosclerotic heart disease of native coronary artery without angina pectoris: Secondary | ICD-10-CM | POA: Diagnosis not present

## 2020-11-09 DIAGNOSIS — Z9359 Other cystostomy status: Secondary | ICD-10-CM

## 2020-11-09 DIAGNOSIS — K7460 Unspecified cirrhosis of liver: Secondary | ICD-10-CM | POA: Diagnosis present

## 2020-11-09 DIAGNOSIS — M2550 Pain in unspecified joint: Secondary | ICD-10-CM | POA: Diagnosis not present

## 2020-11-09 DIAGNOSIS — Z515 Encounter for palliative care: Secondary | ICD-10-CM | POA: Diagnosis not present

## 2020-11-09 DIAGNOSIS — R310 Gross hematuria: Secondary | ICD-10-CM | POA: Diagnosis present

## 2020-11-09 DIAGNOSIS — R52 Pain, unspecified: Secondary | ICD-10-CM | POA: Diagnosis not present

## 2020-11-09 DIAGNOSIS — K7200 Acute and subacute hepatic failure without coma: Secondary | ICD-10-CM | POA: Diagnosis present

## 2020-11-09 DIAGNOSIS — R1084 Generalized abdominal pain: Secondary | ICD-10-CM | POA: Diagnosis not present

## 2020-11-09 DIAGNOSIS — G9340 Encephalopathy, unspecified: Secondary | ICD-10-CM | POA: Diagnosis not present

## 2020-11-09 DIAGNOSIS — Y846 Urinary catheterization as the cause of abnormal reaction of the patient, or of later complication, without mention of misadventure at the time of the procedure: Secondary | ICD-10-CM | POA: Diagnosis present

## 2020-11-09 DIAGNOSIS — T83510A Infection and inflammatory reaction due to cystostomy catheter, initial encounter: Principal | ICD-10-CM | POA: Diagnosis present

## 2020-11-09 DIAGNOSIS — C7951 Secondary malignant neoplasm of bone: Secondary | ICD-10-CM | POA: Diagnosis present

## 2020-11-09 DIAGNOSIS — C787 Secondary malignant neoplasm of liver and intrahepatic bile duct: Secondary | ICD-10-CM | POA: Diagnosis present

## 2020-11-09 DIAGNOSIS — K5900 Constipation, unspecified: Secondary | ICD-10-CM | POA: Diagnosis present

## 2020-11-09 DIAGNOSIS — R161 Splenomegaly, not elsewhere classified: Secondary | ICD-10-CM | POA: Diagnosis present

## 2020-11-09 DIAGNOSIS — A4150 Gram-negative sepsis, unspecified: Secondary | ICD-10-CM | POA: Diagnosis not present

## 2020-11-09 DIAGNOSIS — Z85038 Personal history of other malignant neoplasm of large intestine: Secondary | ICD-10-CM | POA: Diagnosis not present

## 2020-11-09 DIAGNOSIS — I81 Portal vein thrombosis: Secondary | ICD-10-CM | POA: Diagnosis present

## 2020-11-09 DIAGNOSIS — C61 Malignant neoplasm of prostate: Secondary | ICD-10-CM | POA: Diagnosis present

## 2020-11-09 DIAGNOSIS — N210 Calculus in bladder: Secondary | ICD-10-CM | POA: Diagnosis present

## 2020-11-09 DIAGNOSIS — E871 Hypo-osmolality and hyponatremia: Secondary | ICD-10-CM | POA: Diagnosis present

## 2020-11-09 DIAGNOSIS — B952 Enterococcus as the cause of diseases classified elsewhere: Secondary | ICD-10-CM | POA: Diagnosis present

## 2020-11-09 DIAGNOSIS — J449 Chronic obstructive pulmonary disease, unspecified: Secondary | ICD-10-CM | POA: Diagnosis not present

## 2020-11-09 DIAGNOSIS — R338 Other retention of urine: Secondary | ICD-10-CM | POA: Diagnosis not present

## 2020-11-09 DIAGNOSIS — R112 Nausea with vomiting, unspecified: Secondary | ICD-10-CM | POA: Diagnosis not present

## 2020-11-09 DIAGNOSIS — R404 Transient alteration of awareness: Secondary | ICD-10-CM | POA: Diagnosis not present

## 2020-11-09 DIAGNOSIS — Z7401 Bed confinement status: Secondary | ICD-10-CM | POA: Diagnosis not present

## 2020-11-09 DIAGNOSIS — D630 Anemia in neoplastic disease: Secondary | ICD-10-CM | POA: Diagnosis present

## 2020-11-09 DIAGNOSIS — F17210 Nicotine dependence, cigarettes, uncomplicated: Secondary | ICD-10-CM | POA: Diagnosis present

## 2020-11-09 DIAGNOSIS — Z87442 Personal history of urinary calculi: Secondary | ICD-10-CM | POA: Diagnosis not present

## 2020-11-09 DIAGNOSIS — E876 Hypokalemia: Secondary | ICD-10-CM | POA: Diagnosis not present

## 2020-11-09 DIAGNOSIS — K769 Liver disease, unspecified: Secondary | ICD-10-CM | POA: Diagnosis not present

## 2020-11-09 DIAGNOSIS — R109 Unspecified abdominal pain: Secondary | ICD-10-CM | POA: Diagnosis not present

## 2020-11-09 DIAGNOSIS — I10 Essential (primary) hypertension: Secondary | ICD-10-CM | POA: Diagnosis present

## 2020-11-09 DIAGNOSIS — I700 Atherosclerosis of aorta: Secondary | ICD-10-CM | POA: Diagnosis present

## 2020-11-09 DIAGNOSIS — R1110 Vomiting, unspecified: Secondary | ICD-10-CM | POA: Diagnosis not present

## 2020-11-09 DIAGNOSIS — J9610 Chronic respiratory failure, unspecified whether with hypoxia or hypercapnia: Secondary | ICD-10-CM | POA: Diagnosis not present

## 2020-11-09 DIAGNOSIS — R180 Malignant ascites: Secondary | ICD-10-CM | POA: Diagnosis present

## 2020-11-09 DIAGNOSIS — R609 Edema, unspecified: Secondary | ICD-10-CM | POA: Diagnosis not present

## 2020-11-09 DIAGNOSIS — Z8042 Family history of malignant neoplasm of prostate: Secondary | ICD-10-CM

## 2020-11-09 DIAGNOSIS — R6520 Severe sepsis without septic shock: Secondary | ICD-10-CM | POA: Diagnosis not present

## 2020-11-09 DIAGNOSIS — C220 Liver cell carcinoma: Secondary | ICD-10-CM

## 2020-11-09 DIAGNOSIS — Z7901 Long term (current) use of anticoagulants: Secondary | ICD-10-CM

## 2020-11-09 DIAGNOSIS — A419 Sepsis, unspecified organism: Secondary | ICD-10-CM

## 2020-11-09 DIAGNOSIS — Z79899 Other long term (current) drug therapy: Secondary | ICD-10-CM

## 2020-11-09 LAB — COMPREHENSIVE METABOLIC PANEL
ALT: 36 U/L (ref 0–44)
AST: 203 U/L — ABNORMAL HIGH (ref 15–41)
Albumin: 2.7 g/dL — ABNORMAL LOW (ref 3.5–5.0)
Alkaline Phosphatase: 177 U/L — ABNORMAL HIGH (ref 38–126)
Anion gap: 13 (ref 5–15)
BUN: 9 mg/dL (ref 8–23)
CO2: 24 mmol/L (ref 22–32)
Calcium: 9.1 mg/dL (ref 8.9–10.3)
Chloride: 96 mmol/L — ABNORMAL LOW (ref 98–111)
Creatinine, Ser: 0.89 mg/dL (ref 0.61–1.24)
GFR, Estimated: >60 mL/min (ref >60)
Glucose, Bld: 82 mg/dL (ref 70–99)
Potassium: 3.8 mmol/L (ref 3.5–5.1)
Sodium: 133 mmol/L — ABNORMAL LOW (ref 135–145)
Total Bilirubin: 6.7 mg/dL — ABNORMAL HIGH (ref 0.3–1.2)
Total Protein: 6.3 g/dL — ABNORMAL LOW (ref 6.5–8.1)

## 2020-11-09 LAB — CBC
HCT: 36.4 % — ABNORMAL LOW (ref 39.0–52.0)
Hemoglobin: 12.3 g/dL — ABNORMAL LOW (ref 13.0–17.0)
MCH: 36.4 pg — ABNORMAL HIGH (ref 26.0–34.0)
MCHC: 33.8 g/dL (ref 30.0–36.0)
MCV: 107.7 fL — ABNORMAL HIGH (ref 80.0–100.0)
Platelets: 216 10*3/uL (ref 150–400)
RBC: 3.38 MIL/uL — ABNORMAL LOW (ref 4.22–5.81)
RDW: 14.4 % (ref 11.5–15.5)
WBC: 9.3 10*3/uL (ref 4.0–10.5)
nRBC: 0 % (ref 0.0–0.2)

## 2020-11-09 LAB — TROPONIN I (HIGH SENSITIVITY): Troponin I (High Sensitivity): 9 ng/L (ref <18)

## 2020-11-09 LAB — LIPASE, BLOOD: Lipase: 21 U/L (ref 11–51)

## 2020-11-09 MED ORDER — ONDANSETRON HCL 4 MG/2ML IJ SOLN
4.0000 mg | INTRAMUSCULAR | Status: AC
  Administered 2020-11-10: 4 mg via INTRAVENOUS
  Filled 2020-11-09: qty 2

## 2020-11-09 MED ORDER — LACTATED RINGERS IV BOLUS: 1000.0000 mL | Freq: Once | INTRAVENOUS | Status: DC

## 2020-11-09 MED ORDER — SODIUM CHLORIDE 0.9 % IV BOLUS
1000.0000 mL | Freq: Once | INTRAVENOUS | Status: AC
  Administered 2020-11-09: 1000 mL via INTRAVENOUS

## 2020-11-09 MED ORDER — MORPHINE SULFATE (PF) 4 MG/ML IV SOLN
4.0000 mg | Freq: Once | INTRAVENOUS | Status: AC
  Administered 2020-11-10: 4 mg via INTRAVENOUS
  Filled 2020-11-09: qty 1

## 2020-11-09 NOTE — ED Triage Notes (Signed)
Pt presents to ER from home via EMS with complaints of lower abdominal pain, nausea and vomiting. Pt has stage III Bone Cancer. Reports was admitted in the hospital last week for Sepsis. Pt has suprapubic catherer and urine is "red in color" per pt. EMS administered 4mg  of zofran and 272ml NS. Pt talks in complete sentences, no respiratory distress noted

## 2020-11-09 NOTE — ED Notes (Signed)
ED Provider at bedside. 

## 2020-11-09 NOTE — ED Provider Notes (Addendum)
[facility] Emergency Department Provider Note  ____________________________________________   Event Date/Time   First MD Initiated Contact with Patient 11/09/20 2309     (approximate)  I have reviewed the triage vital signs and the nursing notes.   HISTORY  Chief Complaint Emesis and Abdominal Pain    HPI Gerald Odonnell is a 66 y.o. male whose medical history includes "bone cancer" and who has a chronic suprapubic catheter.  He is followed by Dr. Grayland Ormond for oncology but it is unclear if he is actively undergoing treatment.  He presents tonight for a variety of complaints including gradually worsening but now severe symptoms of generalized aching and sharp abdominal pain, nausea, vomiting, blood in his suprapubic catheter bag, and generalized weakness.  He said the symptoms have been gradually worsening for about a week and have become intolerable.  He is not able to keep anything down and has been vomiting whenever he tries to eat or drink.  He says he has vomited 8 times today.  His pain is sharp and throughout his abdomen going across both sides.  The last time he felt like this he was admitted for an extended period in the hospital due to sepsis from his suprapubic catheter; this occurred last month.  Nothing in particular makes his symptoms better or worse.  He said he has had subjective fevers and then will get chills.  He has had some pain in his chest, no shortness of breath but some cough.     Past Medical History:  Diagnosis Date   Cancer [facility])    colon   Cancer ([place])    bone cancer   Colon cancer ([place])    Hx of   History of kidney stones    Hypertension    Prostate cancer ([place])    Prostate cancer ([place])    Hx of    S/P colon resection    Shortness of breath dyspnea    Suprapubic catheter [facility])    Urinary retention     Patient Active Problem List   Diagnosis Date Noted   Pressure injury of skin 10/14/2020   Lower urinary tract  infectious disease    Acute metabolic encephalopathy 37/01/6268   Weakness 10/03/2020   Fall 10/03/2020   Hypokalemia 10/03/2020   Sepsis ([place]) 10/03/2020   Atherosclerosis of aorta ([place]) 09/12/2020   Personal history of colon cancer 02/05/2020   Closed bimalleolar fracture 05/05/2018   Benign hypertension 08/22/2017   Encounter for screening colonoscopy 08/22/2017   Dysuria 08/22/2017   Urinary retention 03/15/2017   Prostate cancer ([place]) 03/15/2017   Abscess 01/24/2016   Sepsis secondary to UTI ([place]) 09/10/2015   Chest pain 09/10/2015   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   Clostridium difficile diarrhea    Colitis, infectious 04/23/2015   Incomplete bladder emptying 09/10/2014   Edema leg 09/15/2013   Pelvic and perineal pain 09/15/2013   Lower extremity venous stasis 09/15/2013   Pelvic pain in male 09/15/2013   Benign prostatic hyperplasia with urinary obstruction 07/22/2013    Past Surgical History:  Procedure Laterality Date   ABDOMINAL SURGERY     COLON SURGERY     CYSTOSCOPY WITH LITHOLAPAXY N/A 06/09/2017   Procedure: CYSTOSCOPY WITH LITHOLAPAXY;  Surgeon: Hollice Espy, MD;  Location: [facility] ORS;  Service: Urology;  Laterality: N/A;   PROSTATE SURGERY  05/25/2016   SUPRAPUBIC CATHETER INSERTION      Prior to Admission medications   Medication Sig Start Date End Date Taking? Authorizing Provider  abiraterone acetate (ZYTIGA) 250 MG tablet TAKE 4 TABLETS (1,000 MG TOTAL) BY MOUTH DAILY. TAKE ON AN EMPTY STOMACH 1 HOUR BEFORE OR 2 HOURS AFTER A MEAL. 11/01/20 11/01/21 Yes Verlon Au, NP  furosemide (LASIX) 20 MG tablet Take 1 tablet (20 mg total) by mouth 2 (two) times daily. 09/11/20  Yes Abernathy, Yetta Flock, NP  oxybutynin (DITROPAN) 5 MG tablet Take 1 tablet (5 mg total) by mouth 4 (four) times daily. 09/11/20  Yes Abernathy, Yetta Flock, NP  predniSONE (DELTASONE) 5 MG tablet TAKE 1 TABLET BY MOUTH EVERYDAY WITH BREAKFAST 11/01/20 11/01/21 Yes Verlon Au, NP  acetaminophen  (TYLENOL) 325 MG tablet Take 2 tablets (650 mg total) by mouth every 6 (six) hours as needed for mild pain or moderate pain (or Fever >/= 101). Patient not taking: Reported on 11/10/2020 10/14/20   Elgergawy, Silver Huguenin, MD  Cholecalciferol (VITAMIN D) 125 MCG (5000 UT) CAPS Take 1 capsule by mouth daily. Patient not taking: Reported on 11/10/2020    [provider]  diltiazem (CARDIZEM CD) 240 MG 24 hr capsule Take 1 capsule (240 mg total) by mouth daily. Patient not taking: Reported on 11/10/2020 10/11/20   Fritzi Mandes, MD  docusate sodium (COLACE) 100 MG capsule Take 1 capsule (100 mg total) by mouth 2 (two) times daily as needed for mild constipation. Patient not taking: Reported on 11/10/2020 10/14/20   Elgergawy, Silver Huguenin, MD  metoprolol tartrate (LOPRESSOR) 25 MG tablet Take 0.5 tablets (12.5 mg total) by mouth 2 (two) times daily. Patient not taking: Reported on 11/10/2020 10/14/20   Elgergawy, Silver Huguenin, MD  potassium chloride (KLOR-CON) 10 MEQ tablet Take 1 tablet (10 mEq total) by mouth daily. Patient not taking: Reported on 11/10/2020 10/15/20   Elgergawy, Silver Huguenin, MD  senna (SENOKOT) 8.6 MG TABS tablet Take 1 tablet by mouth daily. Patient not taking: Reported on 11/10/2020    [provider]  amiodarone (PACERONE) 200 MG tablet Take 1 tablet (200 mg total) by mouth 2 (two) times daily. 10/10/20 10/10/20  Fritzi Mandes, MD  apixaban (ELIQUIS) 5 MG TABS tablet Take 1 tablet (5 mg total) by mouth 2 (two) times daily. 10/10/20 10/10/20  Fritzi Mandes, MD    Allergies Patient has no known allergies.  Family History  Problem Relation Age of Onset   Hypertension Other    Alcohol abuse Mother    Heart attack Father    Prostate cancer Brother    Breast cancer Sister     Social History Social History   Tobacco Use   Smoking status: Every Day    Packs/day: 0.25    Years: 40.00    Pack years: 10.00    Types: Cigarettes   Smokeless tobacco: Never   Tobacco comments:    1pack a  week  Vaping Use   Vaping Use: Never used  Substance Use Topics   Alcohol use: Yes   Drug use: No    Review of Systems Constitutional: Subjective fever and chills Eyes: No visual changes. ENT: No sore throat. Cardiovascular: Positive for aching chest pain. Respiratory: Denies shortness of breath.  Positive for mild cough. Gastrointestinal: Sharp generalized abdominal pain with nausea and vomiting. Genitourinary: Bloody urine in his suprapubic catheter bag. Musculoskeletal: Negative for neck pain.  Negative for back pain. Integumentary: Negative for rash. Neurological: Negative for headaches, focal weakness or numbness.   ____________________________________________   PHYSICAL EXAM:  VITAL SIGNS: ED Triage Vitals  Enc Vitals Group     BP 11/09/20 2245  119/74     Pulse Rate 11/09/20 2245 (!) 107     Resp 11/09/20 2245 (!) 24     Temp 11/09/20 2245 98.6 F (37 C)     Temp Source 11/09/20 2245 Oral     SpO2 11/09/20 2245 97 %     Weight 11/09/20 2246 88.9 kg (196 lb)     Height 11/09/20 2246 1.778 m (5\' 10" )     Head Circumference --      Peak Flow --      Pain Score 11/09/20 2246 10     Pain Loc --      Pain Edu? --      Excl. in [place]? --     Constitutional: Alert and oriented.  [place] of chronic illness. Eyes: Conjunctivae are normal.  Head: Atraumatic. Nose: No congestion/rhinnorhea. Mouth/Throat: Patient is wearing a mask. Neck: No stridor.  No meningeal signs.   Cardiovascular: Tachycardia, regular rhythm. Good peripheral circulation. Respiratory: Normal respiratory effort.  No retractions. Gastrointestinal: Soft with questionable mild distention.  Tenderness to palpation throughout the abdomen with some guarding.  Suprapubic catheter in place. Genitourinary: Bloody urine is present in the suprapubic catheter bag. Musculoskeletal: 1+ pitting edema bilaterally, appears chronic. No gross deformities of extremities. Neurologic:  Normal speech and language. No  gross focal neurologic deficits are appreciated.  Skin:  Skin is warm, dry and intact.  Chronic skin thickening and changes consistent with chronic edema and probable poor venous return in his legs, no evidence of active infection. Psychiatric: Mood and affect are normal. Speech and behavior are normal.  ____________________________________________   LABS (all labs ordered are listed, but only abnormal results are displayed)  Labs Reviewed  COMPREHENSIVE METABOLIC PANEL - Abnormal; Notable for the following components:      Result Value   Sodium 133 (*)    Chloride 96 (*)    Total Protein 6.3 (*)    Albumin 2.7 (*)    AST 203 (*)    Alkaline Phosphatase 177 (*)    Total Bilirubin 6.7 (*)    All other components within normal limits  CBC - Abnormal; Notable for the following components:   RBC 3.38 (*)    Hemoglobin 12.3 (*)    HCT 36.4 (*)    MCV 107.7 (*)    MCH 36.4 (*)    All other components within normal limits  URINALYSIS, COMPLETE (UACMP) WITH MICROSCOPIC - Abnormal; Notable for the following components:   Color, Urine AMBER (*)    APPearance CLOUDY (*)    Hgb urine dipstick SMALL (*)    Leukocytes,Ua SMALL (*)    WBC, UA >50 (*)    Bacteria, UA MANY (*)    All other components within normal limits  LACTIC ACID, PLASMA - Abnormal; Notable for the following components:   Lactic Acid, Venous 4.0 (*)    All other components within normal limits  LACTIC ACID, PLASMA - Abnormal; Notable for the following components:   Lactic Acid, Venous 3.9 (*)    All other components within normal limits  URINE CULTURE  RESP PANEL BY RT-PCR (FLU A&B, COVID) ARPGX2  CULTURE, BLOOD (ROUTINE X 2)  CULTURE, BLOOD (ROUTINE X 2)  LIPASE, BLOOD  PROCALCITONIN  TROPONIN I (HIGH SENSITIVITY)  TROPONIN I (HIGH SENSITIVITY)   ____________________________________________  EKG  ED ECG REPORT I, Hinda Kehr, the attending physician, personally viewed and interpreted this ECG.  Date:  11/08/2020 EKG Time: 22: 58 Rate: 105 Rhythm: Sinus tachycardia with ventricular  bigeminy QRS Axis: normal Intervals: normal ST/T Wave abnormalities: Non-specific ST segment / T-wave changes, but no clear evidence of acute ischemia. Narrative Interpretation: no definitive evidence of acute ischemia; does not meet STEMI criteria.   ED ECG REPORT I, Hinda Kehr, the attending physician, personally viewed and interpreted this ECG.  Date: 11/10/2020 EKG Time: 1:42 AM Rate: 104 Rhythm: Mild tachycardia QRS Axis: normal Intervals: normal ST/T Wave abnormalities: Non-specific ST segment / T-wave changes, but no clear evidence of acute ischemia. Narrative Interpretation: no definitive evidence of acute ischemia; does not meet STEMI criteria.  ____________________________________________  RADIOLOGY I, Hinda Kehr, personally viewed and evaluated these images (plain radiographs) as part of my medical decision making, as well as reviewing the written report by the radiologist.  ED MD interpretation: Extensive tumor burden in the liver and portal vein consistent with hepatocellular carcinoma.  Official radiology report(s): CT ABDOMEN PELVIS W CONTRAST  Result Date: 11/10/2020 CLINICAL DATA:  Lower abdominal pain, nausea, vomiting. Metastatic prostate cancer. EXAM: CT ABDOMEN AND PELVIS WITH CONTRAST TECHNIQUE: Multidetector CT imaging of the abdomen and pelvis was performed using the standard protocol following bolus administration of intravenous contrast. CONTRAST:  150mL OMNIPAQUE IOHEXOL 350 MG/ML SOLN COMPARISON:  None. FINDINGS: Lower chest: Stable mild elevation of the left hemidiaphragm. The visualized lung bases are clear. Moderate coronary artery calcification. Hepatobiliary: The liver contour is nodular and there is hypertrophy of the a left hepatic lobe and caudate lobe in keeping with changes of underlying cirrhosis. Since the prior examination, there has developed a markedly  heterogeneous enhancement pattern within the right hepatic lobe with multiple hypoattenuating intrahepatic mass is identified. While predominantly located within the right hepatic lobe, several hypoenhancing masses are also noted within the left hepatic lobe. The dominant mass is seen within segment 5 of the liver and appears to demonstrate confluent, expansile tumor thrombus within the a portal venous system extending into the a main portal vein just proximal to the portosplenic confluence. Altogether, this is most in keeping with multifocal hepatocellular carcinoma with associated portal venous extension. Exact measurement of the primary mass is difficult to delineate on this examination. An index lesion, however, within the inferior right hepatic lobe measures 3.7 x 5.0 cm at axial image # 50/2. No intra or extrahepatic biliary ductal dilation. Gallbladder unremarkable. Pancreas: Unremarkable Spleen: The spleen is enlarged, measuring 14.4 cm in greatest dimension, similar to that noted on prior examination. Adrenals/Urinary Tract: Bilateral adrenal nodules are unchanged measuring 20 mm on the right and 13 mm on the left. These are not well characterized on this examination. The kidneys are unremarkable. Multiple calculi are seen laying dependently within the bladder lumen, progressive since prior examination with the dominant calculus measuring 11 x 28 mm a suprapubic catheter seen within a decompressed bladder lumen. Stomach/Bowel: Distal colectomy has been performed with an anastomotic staple line seen within the splenic flexure. The stomach, small bowel, and large bowel are otherwise unremarkable. Appendix normal. Interval development of mild ascites. No free intraperitoneal gas. Vascular/Lymphatic: Moderate aortoiliac atherosclerotic calcification. No aortic aneurysm. Small gastroesophageal varices identified. Multiple shotty lymph nodes are again identified within the porta hepatis, stable since prior  examination, nonspecific. Index lymph node measures 13 mm in short axis diameter at axial image # 40/2. No additional pathologic adenopathy within the abdomen and pelvis. Reproductive: Brachytherapy seeds noted within the prostate gland. Other: Small fat containing inguinal hernias again noted. Musculoskeletal: Numerable sclerotic metastases are again seen throughout the visualized axial skeleton in keeping with the  patient's known underlying metastatic prostate cancer. No pathologic fracture identified. IMPRESSION: Interval development of innumerable hepatic hypoenhancing masses with thrombosis of the main, right, and left portal veins most in keeping with multifocal hepatocellular carcinoma with portal venous extension/tumor thrombus. Tumor thrombus extends into the proximal portal vein just distal to the portal splenic confluence. Background changes of cirrhosis. Interval development of mild ascites likely reflects changes of portal venous hypertension. Correlation with serum AFP levels would be helpful in confirming this. Contrast enhanced MRI examination would also be helpful in further evaluating this finding. Stable splenomegaly. Stable adrenal nodules, not optimally characterized on this examination. Progressive calculi within the bladder lumen. Decompression of the bladder with suprapubic catheter in appropriate position. Grossly stable innumerable sclerotic metastases throughout the visualized axial skeleton in keeping with the patient's known metastatic prostate cancer. Aortic Atherosclerosis (ICD10-I70.0). Electronically Signed   By: Fidela Salisbury MD   On: 11/10/2020 01:04   DG Chest Port 1 View  Result Date: 11/09/2020 CLINICAL DATA:  Lower abdominal pain EXAM: PORTABLE CHEST 1 VIEW COMPARISON:  10/12/2020, 08/17/2016 FINDINGS: Chronic elevation left diaphragm with pleural scarring. No focal opacity or pleural effusion. Stable cardiomediastinal silhouette. No pneumothorax. Sclerotic bony metastatic  disease redemonstrated. IMPRESSION: No active disease.  Sclerotic bony metastatic disease. Electronically Signed   By: Donavan Foil M.D.   On: 11/09/2020 23:57    ____________________________________________   PROCEDURES   Procedure(s) performed (including Critical Care):  .1-3 Lead EKG Interpretation  Date/Time: 11/09/2020 11:43 PM Performed by: Hinda Kehr, MD Authorized by: Hinda Kehr, MD     Interpretation: abnormal     ECG rate:  110   ECG rate assessment: tachycardic     Rhythm: sinus tachycardia     Ectopy: bigeminy     Conduction: normal   .Critical Care  Date/Time: 11/10/2020 2:54 AM Performed by: Hinda Kehr, MD Authorized by: Hinda Kehr, MD   Critical care provider statement:    Critical care time (minutes):  60   Critical care time was exclusive of:  Separately billable procedures and treating other patients   Critical care was necessary to treat or prevent imminent or life-threatening deterioration of the following conditions:  Sepsis   Critical care was time spent personally by me on the following activities:  Development of treatment plan with patient or surrogate, discussions with consultants, evaluation of patient's response to treatment, examination of patient, obtaining history from patient or surrogate, ordering and performing treatments and interventions, ordering and review of laboratory studies, ordering and review of radiographic studies, pulse oximetry, re-evaluation of patient's condition and review of old charts   ____________________________________________   [place] / MDM / [place] / ED COURSE  As part of my medical decision making, I reviewed the following data within the [place] notes reviewed and incorporated, Labs reviewed , EKG interpreted , Old chart reviewed, Radiograph reviewed , Discussed with admitting physician , Discussed with oncologist, and Notes from prior ED  visits   Differential diagnosis includes, but is not limited to, sepsis, urinary tract infection, SBO/ileus, diverticulitis, biliary disease.  The patient is on the cardiac monitor to evaluate for evidence of arrhythmia and/or significant heart rate changes.  I reviewed the medical record and saw the patient was extremely sick with sepsis due to multi organism UTI a month ago and multisystem organ involvement.  He did receive a full course of antibiotic treatment and he seems to been doing well until this last week.  He is  currently mildly tachypneic and tachycardic.  His CBC shows no leukocytosis which is reassuring but I am concerned about the possibility of sepsis and I am initiating a "possible sepsis" work-up.  Comprehensive metabolic panel is notable for mild LFT elevation and more concerning is his total bilirubin of 6.7.  I do not have any old values against which to compare.  As the initial and I believe appropriate test I will start with a CT scan of the abdomen and pelvis with IV contrast, but I am concerned that he may have biliary system dysfunction such as  choledocholithiasis, cholecystitis, or even a sending cholangitis although he is afebrile here but he is reporting subjective fever at home.  I will consider advanced imaging with MRCP as appropriate but the CT scan should provide some initial information.  Morphine 4 mg IV, Zofran 4 mg IV, normal saline 1 L IV bolus, n.p.o., urinalysis, reassessment.     Clinical Course as of 11/10/20 0254  Sun Nov 10, 2020  0002 DG Chest [place] 1 View I personally reviewed the patient's imaging and agree with the radiologist's interpretation that there is no evidence of acute infection or other acute or emergent abnormality on the chest x-ray. [CF]  0003 Lactic Acid, Venous(!!): 4.0 Concerning for severe sepsis.  Initiating empiric broad spectrum antibiotics (cefepime 2 g IV, vanc per pharmacy consult, metronidazole 500 mg IV). [CF]  0014  Procalcitonin: 1.10 Suggestive of systemic infection [CF]  0145 Discussed case by phone with Dr. Rogue Bussing.  We discussed the case in detail and he reviewed the CT scan findings.  He said that we can take care of this patient at [facility] and there is no need for transfer.  He also said there is no need for GI consultation at this time and spite of the elevated bilirubin given that this appears to be a tumor thrombus and there is no need for emergent GI intervention.  Dr. Rogue Bussing agreed with the current plan to treat for presumed sepsis and he will see the patient in the morning and requested hospitalist consultation.  I updated the patient and he has having some additional pain for which I ordered morphine 4 mg IV.  His repeat EKG is reassuring with no sign of ischemia.  Patient understands the need for admission. [CF]  0204 Discussed case by phone with Dr. Sidney Ace who will admit [CF]    Clinical Course User Index [CF] Hinda Kehr, MD     ____________________________________________  FINAL CLINICAL IMPRESSION(S) / ED DIAGNOSES  Final diagnoses:  Severe sepsis ([place])  Hepatocellular carcinoma ([place])  Suprapubic catheter ([place])     MEDICATIONS GIVEN DURING THIS VISIT:  Medications  vancomycin (VANCOREADY) IVPB 2000 mg/400 mL (2,000 mg Intravenous New Bag/Given 11/10/20 0226)  morphine 4 MG/ML injection 4 mg (4 mg Intravenous Given 11/10/20 0105)  ondansetron (ZOFRAN) injection 4 mg (4 mg Intravenous Given 11/10/20 0104)  sodium chloride 0.9 % bolus 1,000 mL (0 mLs Intravenous Stopped 11/10/20 0219)  lactated ringers bolus 1,000 mL (0 mLs Intravenous Stopped 11/10/20 0140)    And  lactated ringers bolus 1,000 mL (0 mLs Intravenous Stopped 11/10/20 0146)  ceFEPIme (MAXIPIME) 2 g in sodium chloride 0.9 % 100 mL IVPB (0 g Intravenous Stopped 11/10/20 0127)  metroNIDAZOLE (FLAGYL) IVPB 500 mg (0 mg Intravenous Stopped 11/10/20 0218)  iohexol (OMNIPAQUE) 350 MG/ML injection 100 mL (100 mLs Intravenous  Contrast Given 11/10/20 0017)  morphine 4 MG/ML injection 4 mg (4 mg Intravenous Given 11/10/20 0222)  ED Discharge Orders     None        Note:  This document was prepared using Dragon voice recognition software and may include unintentional dictation errors.   Hinda Kehr, MD 11/10/20 1747    Hinda Kehr, MD 11/10/20 [phone]

## 2020-11-10 ENCOUNTER — Emergency Department: Payer: Medicare HMO

## 2020-11-10 ENCOUNTER — Other Ambulatory Visit: Payer: Medicare HMO

## 2020-11-10 DIAGNOSIS — I2510 Atherosclerotic heart disease of native coronary artery without angina pectoris: Secondary | ICD-10-CM | POA: Diagnosis not present

## 2020-11-10 DIAGNOSIS — K769 Liver disease, unspecified: Secondary | ICD-10-CM | POA: Diagnosis not present

## 2020-11-10 DIAGNOSIS — Z9359 Other cystostomy status: Secondary | ICD-10-CM | POA: Diagnosis not present

## 2020-11-10 DIAGNOSIS — Z85038 Personal history of other malignant neoplasm of large intestine: Secondary | ICD-10-CM | POA: Diagnosis not present

## 2020-11-10 DIAGNOSIS — C61 Malignant neoplasm of prostate: Secondary | ICD-10-CM | POA: Diagnosis not present

## 2020-11-10 DIAGNOSIS — K7460 Unspecified cirrhosis of liver: Secondary | ICD-10-CM

## 2020-11-10 DIAGNOSIS — A419 Sepsis, unspecified organism: Secondary | ICD-10-CM

## 2020-11-10 DIAGNOSIS — J9610 Chronic respiratory failure, unspecified whether with hypoxia or hypercapnia: Secondary | ICD-10-CM | POA: Diagnosis not present

## 2020-11-10 DIAGNOSIS — I81 Portal vein thrombosis: Secondary | ICD-10-CM | POA: Diagnosis present

## 2020-11-10 DIAGNOSIS — Z515 Encounter for palliative care: Secondary | ICD-10-CM | POA: Diagnosis not present

## 2020-11-10 DIAGNOSIS — F17210 Nicotine dependence, cigarettes, uncomplicated: Secondary | ICD-10-CM | POA: Diagnosis present

## 2020-11-10 DIAGNOSIS — Z20822 Contact with and (suspected) exposure to covid-19: Secondary | ICD-10-CM | POA: Diagnosis present

## 2020-11-10 DIAGNOSIS — A4150 Gram-negative sepsis, unspecified: Secondary | ICD-10-CM

## 2020-11-10 DIAGNOSIS — R180 Malignant ascites: Secondary | ICD-10-CM | POA: Diagnosis present

## 2020-11-10 DIAGNOSIS — C787 Secondary malignant neoplasm of liver and intrahepatic bile duct: Secondary | ICD-10-CM | POA: Diagnosis not present

## 2020-11-10 DIAGNOSIS — Z8042 Family history of malignant neoplasm of prostate: Secondary | ICD-10-CM | POA: Diagnosis not present

## 2020-11-10 DIAGNOSIS — R161 Splenomegaly, not elsewhere classified: Secondary | ICD-10-CM | POA: Diagnosis present

## 2020-11-10 DIAGNOSIS — C7951 Secondary malignant neoplasm of bone: Secondary | ICD-10-CM

## 2020-11-10 DIAGNOSIS — R404 Transient alteration of awareness: Secondary | ICD-10-CM | POA: Diagnosis not present

## 2020-11-10 DIAGNOSIS — J449 Chronic obstructive pulmonary disease, unspecified: Secondary | ICD-10-CM | POA: Diagnosis not present

## 2020-11-10 DIAGNOSIS — Y846 Urinary catheterization as the cause of abnormal reaction of the patient, or of later complication, without mention of misadventure at the time of the procedure: Secondary | ICD-10-CM | POA: Diagnosis present

## 2020-11-10 DIAGNOSIS — I700 Atherosclerosis of aorta: Secondary | ICD-10-CM | POA: Diagnosis present

## 2020-11-10 DIAGNOSIS — T83510A Infection and inflammatory reaction due to cystostomy catheter, initial encounter: Secondary | ICD-10-CM | POA: Diagnosis present

## 2020-11-10 DIAGNOSIS — E871 Hypo-osmolality and hyponatremia: Secondary | ICD-10-CM | POA: Diagnosis present

## 2020-11-10 DIAGNOSIS — R1110 Vomiting, unspecified: Secondary | ICD-10-CM | POA: Diagnosis not present

## 2020-11-10 DIAGNOSIS — Z8249 Family history of ischemic heart disease and other diseases of the circulatory system: Secondary | ICD-10-CM | POA: Diagnosis not present

## 2020-11-10 DIAGNOSIS — R109 Unspecified abdominal pain: Secondary | ICD-10-CM

## 2020-11-10 DIAGNOSIS — C220 Liver cell carcinoma: Secondary | ICD-10-CM | POA: Diagnosis present

## 2020-11-10 DIAGNOSIS — M2550 Pain in unspecified joint: Secondary | ICD-10-CM | POA: Diagnosis not present

## 2020-11-10 DIAGNOSIS — Z66 Do not resuscitate: Secondary | ICD-10-CM | POA: Diagnosis present

## 2020-11-10 DIAGNOSIS — R6520 Severe sepsis without septic shock: Secondary | ICD-10-CM

## 2020-11-10 DIAGNOSIS — R338 Other retention of urine: Secondary | ICD-10-CM | POA: Diagnosis not present

## 2020-11-10 DIAGNOSIS — K7200 Acute and subacute hepatic failure without coma: Secondary | ICD-10-CM | POA: Diagnosis present

## 2020-11-10 DIAGNOSIS — D630 Anemia in neoplastic disease: Secondary | ICD-10-CM | POA: Diagnosis present

## 2020-11-10 DIAGNOSIS — G9340 Encephalopathy, unspecified: Secondary | ICD-10-CM | POA: Diagnosis not present

## 2020-11-10 DIAGNOSIS — N210 Calculus in bladder: Secondary | ICD-10-CM | POA: Diagnosis present

## 2020-11-10 DIAGNOSIS — Z87442 Personal history of urinary calculi: Secondary | ICD-10-CM | POA: Diagnosis not present

## 2020-11-10 DIAGNOSIS — K766 Portal hypertension: Secondary | ICD-10-CM | POA: Diagnosis present

## 2020-11-10 DIAGNOSIS — N390 Urinary tract infection, site not specified: Secondary | ICD-10-CM

## 2020-11-10 DIAGNOSIS — Z9981 Dependence on supplemental oxygen: Secondary | ICD-10-CM

## 2020-11-10 DIAGNOSIS — I10 Essential (primary) hypertension: Secondary | ICD-10-CM | POA: Diagnosis present

## 2020-11-10 DIAGNOSIS — Z7401 Bed confinement status: Secondary | ICD-10-CM | POA: Diagnosis not present

## 2020-11-10 LAB — URINALYSIS, COMPLETE (UACMP) WITH MICROSCOPIC
Bilirubin Urine: NEGATIVE
Glucose, UA: NEGATIVE mg/dL
Ketones, ur: NEGATIVE mg/dL
Nitrite: NEGATIVE
Protein, ur: NEGATIVE mg/dL
Specific Gravity, Urine: 1.018 (ref 1.005–1.030)
WBC, UA: >50 WBC/hpf — ABNORMAL HIGH (ref 0–5)
pH: 5 (ref 5.0–8.0)

## 2020-11-10 LAB — BASIC METABOLIC PANEL
Anion gap: 11 (ref 5–15)
BUN: 7 mg/dL — ABNORMAL LOW (ref 8–23)
CO2: 26 mmol/L (ref 22–32)
Calcium: 9.1 mg/dL (ref 8.9–10.3)
Chloride: 97 mmol/L — ABNORMAL LOW (ref 98–111)
Creatinine, Ser: 0.63 mg/dL (ref 0.61–1.24)
GFR, Estimated: >60 mL/min (ref >60)
Glucose, Bld: 69 mg/dL — ABNORMAL LOW (ref 70–99)
Potassium: 2.8 mmol/L — ABNORMAL LOW (ref 3.5–5.1)
Sodium: 134 mmol/L — ABNORMAL LOW (ref 135–145)

## 2020-11-10 LAB — LACTIC ACID, PLASMA
Lactic Acid, Venous: 4 mmol/L (ref 0.5–1.9)
Lactic Acid, Venous: 3.9 mmol/L (ref 0.5–1.9)
Lactic Acid, Venous: 2.8 mmol/L (ref 0.5–1.9)
Lactic Acid, Venous: 2.1 mmol/L (ref 0.5–1.9)

## 2020-11-10 LAB — CORTISOL-AM, BLOOD: Cortisol - AM: 4.7 ug/dL — ABNORMAL LOW (ref 6.7–22.6)

## 2020-11-10 LAB — CBC
HCT: 35.1 % — ABNORMAL LOW (ref 39.0–52.0)
Hemoglobin: 12.2 g/dL — ABNORMAL LOW (ref 13.0–17.0)
MCH: 37.2 pg — ABNORMAL HIGH (ref 26.0–34.0)
MCHC: 34.8 g/dL (ref 30.0–36.0)
MCV: 107 fL — ABNORMAL HIGH (ref 80.0–100.0)
Platelets: 194 10*3/uL (ref 150–400)
RBC: 3.28 MIL/uL — ABNORMAL LOW (ref 4.22–5.81)
RDW: 14.4 % (ref 11.5–15.5)
WBC: 6.8 10*3/uL (ref 4.0–10.5)
nRBC: 0 % (ref 0.0–0.2)

## 2020-11-10 LAB — PROTIME-INR
INR: 2 — ABNORMAL HIGH (ref 0.8–1.2)
Prothrombin Time: 22.4 seconds — ABNORMAL HIGH (ref 11.4–15.2)

## 2020-11-10 LAB — FIBRINOGEN: Fibrinogen: 386 mg/dL (ref 210–475)

## 2020-11-10 LAB — PSA
PSA: 3.8
Prostatic Specific Antigen: 3.8 ng/mL (ref 0.00–4.00)

## 2020-11-10 LAB — PROCALCITONIN
Procalcitonin: 1.1 ng/mL
Procalcitonin: 1.1 ng/mL

## 2020-11-10 LAB — RESP PANEL BY RT-PCR (FLU A&B, COVID) ARPGX2
Influenza A by PCR: NEGATIVE
Influenza B by PCR: NEGATIVE
SARS Coronavirus 2 by RT PCR: NEGATIVE

## 2020-11-10 LAB — APTT: aPTT: 40 seconds — ABNORMAL HIGH (ref 24–36)

## 2020-11-10 LAB — TROPONIN I (HIGH SENSITIVITY): Troponin I (High Sensitivity): 8 ng/L (ref <18)

## 2020-11-10 LAB — LACTATE DEHYDROGENASE: LDH: 362 U/L — ABNORMAL HIGH (ref 98–192)

## 2020-11-10 MED ORDER — LACTATED RINGERS IV BOLUS (SEPSIS)
1000.0000 mL | Freq: Once | INTRAVENOUS | Status: AC
Start: 2020-11-10 — End: 2020-11-10
  Administered 2020-11-10: 1000 mL via INTRAVENOUS

## 2020-11-10 MED ORDER — IOHEXOL 350 MG/ML SOLN
100.0000 mL | Freq: Once | INTRAVENOUS | Status: AC | PRN
  Administered 2020-11-10: 100 mL via INTRAVENOUS

## 2020-11-10 MED ORDER — LACTATED RINGERS IV BOLUS (SEPSIS)
1000.0000 mL | Freq: Once | INTRAVENOUS | Status: AC
  Administered 2020-11-10: 1000 mL via INTRAVENOUS

## 2020-11-10 MED ORDER — ABIRATERONE ACETATE 250 MG PO TABS
1000.0000 mg | ORAL_TABLET | Freq: Every day | ORAL | Status: DC
  Administered 2020-11-10: 1000 mg via ORAL
  Filled 2020-11-10: qty 4

## 2020-11-10 MED ORDER — ABIRATERONE ACETATE 250 MG PO TABS: 1000.0000 mg | ORAL_TABLET | Freq: Every day | ORAL | Status: DC

## 2020-11-10 MED ORDER — VANCOMYCIN HCL 2000 MG/400ML IV SOLN
2000.0000 mg | Freq: Once | INTRAVENOUS | Status: AC
  Administered 2020-11-10: 2000 mg via INTRAVENOUS
  Filled 2020-11-10: qty 400

## 2020-11-10 MED ORDER — ONDANSETRON HCL 4 MG/2ML IJ SOLN
4.0000 mg | Freq: Four times a day (QID) | INTRAMUSCULAR | Status: DC | PRN
  Administered 2020-11-12: 4 mg via INTRAVENOUS
  Filled 2020-11-10: qty 2

## 2020-11-10 MED ORDER — OXYBUTYNIN CHLORIDE 5 MG PO TABS
5.0000 mg | ORAL_TABLET | Freq: Four times a day (QID) | ORAL | Status: DC
  Administered 2020-11-10 – 2020-11-13 (×12): 5 mg via ORAL
  Filled 2020-11-10 (×17): qty 1

## 2020-11-10 MED ORDER — DILTIAZEM HCL ER COATED BEADS 120 MG PO CP24
240.0000 mg | ORAL_CAPSULE | Freq: Every day | ORAL | Status: DC
  Administered 2020-11-10 – 2020-11-11 (×2): 240 mg via ORAL
  Filled 2020-11-10 (×2): qty 2

## 2020-11-10 MED ORDER — METRONIDAZOLE 500 MG/100ML IV SOLN
500.0000 mg | Freq: Once | INTRAVENOUS | Status: AC
  Administered 2020-11-10: 500 mg via INTRAVENOUS
  Filled 2020-11-10: qty 100

## 2020-11-10 MED ORDER — CEFEPIME HCL 2 G IJ SOLR
2.0000 g | Freq: Once | INTRAMUSCULAR | Status: AC
  Administered 2020-11-10: 2 g via INTRAVENOUS
  Filled 2020-11-10: qty 2

## 2020-11-10 MED ORDER — POTASSIUM CHLORIDE 20 MEQ PO PACK
40.0000 meq | PACK | ORAL | Status: AC
  Administered 2020-11-10 (×3): 40 meq via ORAL
  Filled 2020-11-10 (×3): qty 2

## 2020-11-10 MED ORDER — ENOXAPARIN SODIUM 40 MG/0.4ML IJ SOSY
40.0000 mg | PREFILLED_SYRINGE | INTRAMUSCULAR | Status: DC
  Administered 2020-11-10 – 2020-11-13 (×4): 40 mg via SUBCUTANEOUS
  Filled 2020-11-10 (×4): qty 0.4

## 2020-11-10 MED ORDER — SODIUM CHLORIDE 0.9 % IV SOLN
2.0000 g | Freq: Three times a day (TID) | INTRAVENOUS | Status: DC
  Administered 2020-11-10 – 2020-11-11 (×4): 2 g via INTRAVENOUS
  Filled 2020-11-10 (×7): qty 2

## 2020-11-10 MED ORDER — ACETAMINOPHEN 650 MG RE SUPP: 650.0000 mg | Freq: Four times a day (QID) | RECTAL | Status: DC | PRN

## 2020-11-10 MED ORDER — MORPHINE SULFATE (PF) 4 MG/ML IV SOLN
4.0000 mg | Freq: Once | INTRAVENOUS | Status: AC
  Administered 2020-11-10: 4 mg via INTRAVENOUS
  Filled 2020-11-10: qty 1

## 2020-11-10 MED ORDER — FUROSEMIDE 20 MG PO TABS: 20.0000 mg | ORAL_TABLET | Freq: Two times a day (BID) | ORAL | Status: DC

## 2020-11-10 MED ORDER — SODIUM CHLORIDE 0.9 % IV SOLN: INTRAVENOUS | Status: DC

## 2020-11-10 MED ORDER — VANCOMYCIN HCL 1250 MG/250ML IV SOLN
1250.0000 mg | Freq: Two times a day (BID) | INTRAVENOUS | Status: DC
  Filled 2020-11-10 (×2): qty 250

## 2020-11-10 MED ORDER — POTASSIUM CHLORIDE 10 MEQ/100ML IV SOLN
10.0000 meq | INTRAVENOUS | Status: DC
  Administered 2020-11-10: 10 meq via INTRAVENOUS
  Filled 2020-11-10 (×6): qty 100

## 2020-11-10 MED ORDER — ABIRATERONE ACETATE 250 MG PO TABS
1000.0000 mg | ORAL_TABLET | Freq: Every day | ORAL | Status: DC
  Filled 2020-11-10 (×3): qty 4

## 2020-11-10 MED ORDER — TRAZODONE HCL 50 MG PO TABS: 25.0000 mg | ORAL_TABLET | Freq: Every evening | ORAL | Status: DC | PRN

## 2020-11-10 MED ORDER — PREDNISONE 10 MG PO TABS
5.0000 mg | ORAL_TABLET | Freq: Every day | ORAL | Status: DC
  Administered 2020-11-10: 5 mg via ORAL
  Filled 2020-11-10: qty 1

## 2020-11-10 MED ORDER — ONDANSETRON HCL 4 MG PO TABS
4.0000 mg | ORAL_TABLET | Freq: Four times a day (QID) | ORAL | Status: DC | PRN
  Administered 2020-11-10: 4 mg via ORAL
  Filled 2020-11-10 (×2): qty 1

## 2020-11-10 MED ORDER — OXYCODONE-ACETAMINOPHEN 5-325 MG PO TABS
1.0000 | ORAL_TABLET | ORAL | Status: DC | PRN
  Administered 2020-11-10 – 2020-11-11 (×5): 1 via ORAL
  Filled 2020-11-10 (×5): qty 1

## 2020-11-10 MED ORDER — ACETAMINOPHEN 325 MG PO TABS
650.0000 mg | ORAL_TABLET | Freq: Four times a day (QID) | ORAL | Status: DC | PRN
  Administered 2020-11-11: 650 mg via ORAL
  Filled 2020-11-10 (×2): qty 2

## 2020-11-10 MED ORDER — VANCOMYCIN HCL IN DEXTROSE 1-5 GM/200ML-% IV SOLN: 1000.0000 mg | Freq: Once | INTRAVENOUS | Status: DC

## 2020-11-10 MED ORDER — MAGNESIUM HYDROXIDE 400 MG/5ML PO SUSP
30.0000 mL | Freq: Every day | ORAL | Status: DC | PRN
  Administered 2020-11-11: 30 mL via ORAL
  Filled 2020-11-10 (×2): qty 30

## 2020-11-10 NOTE — Progress Notes (Signed)
PHARMACY -  BRIEF ANTIBIOTIC NOTE   Pharmacy has received consult(s) for Cefepime and Vancomycin from an ED provider.  The patient's profile has been reviewed for ht/wt/allergies/indication/available labs.    One time order(s) placed for Cefepime 2 gm and Vanc 2 gm per pt wt 88.9 kg.  Further antibiotics/pharmacy consults should be ordered by admitting physician if indicated.                       Renda Rolls, PharmD, MBA 11/10/2020 12:12 AM

## 2020-11-10 NOTE — Progress Notes (Signed)
CODE SEPSIS - PHARMACY COMMUNICATION  **Broad Spectrum Antibiotics should be administered within 1 hour of Sepsis diagnosis**  Time Code Sepsis Called/Page Received: 0010  Antibiotics Ordered: Cefepime, Flagyl, Vancomcyin  Time of 1st antibiotic administration: 0057  Renda Rolls, PharmD, [facility] 11/10/2020 12:09 AM

## 2020-11-10 NOTE — Progress Notes (Signed)
Pharmacy Antibiotic Note  Gerald Odonnell is a 65 y.o. male admitted on 11/09/2020 with UTI/Sepsis.  Pharmacy has been consulted for Cefepime and Vancomycin dosing.  Plan: Cefepime 2 gm q8hr per indication and renal function  Vancomycin Pt given Vancomycin 2 gm in ED. Vancomycin 1250 mg IV Q 12 hrs. Goal AUC 400-550. Expected AUC: 518.6 SCr used: 0.89  Pharmacy will continue to follow and adjust dosing whenever warranted.  Height: 5\' 10"  (177.8 cm) Weight: 88.9 kg (196 lb) IBW/kg (Calculated) : 73  Temp (24hrs), Avg:98.5 F (36.9 C), Min:98.2 F (36.8 C), Max:98.8 F (37.1 C)  Recent Labs  Lab 11/09/20 2217 11/09/20 2318 11/10/20 0132  WBC 9.3  --   --   CREATININE 0.89  --   --   LATICACIDVEN  --  4.0* 3.9*    Estimated Creatinine Clearance: 92.9 mL/min (by C-G formula based on SCr of 0.89 mg/dL).    No Known Allergies  Antimicrobials this admission: 7/17 Cefepime >>  7/17 Vancomycin >>   Microbiology results: 7/17 BCx: Pending 7/17 UCx: Pending   Thank you for allowing pharmacy to be a part of this patient's care.  Renda Rolls, PharmD, [facility] 11/10/2020 3:26 AM

## 2020-11-10 NOTE — ED Notes (Signed)
Multiple attempts made to obtain 2nd set of blood cultures, with no success, Abx started as to keep within sepsis protocol. Dr. Karma Greaser notified.

## 2020-11-10 NOTE — Progress Notes (Addendum)
PROGRESS NOTE    Gerald Odonnell  JJH:[id number] DOB: 20-Mar-1956 DOA: 11/09/2020 PCP: Jonetta Osgood, NP    Brief Narrative:   Gerald Odonnell is a 65 y.o. male with medical history significant for metastatic prostate cancer to the bone followed by Dr. Grayland Ormond, with history of colon cancer, nephrolithiasis, hypertension on chronic suprapubic catheter, who presented to the emergency room with acute onset of lower abdominal pain mainly in the suprapubic area with associated nausea and dry heaves without diarrhea. Upon arriving the hospital, heart rate was 107, respiratory rate 21, abnormal urine and elevated procalcitonin level of 1.1.  He also had a lactic acid level of 4.0.  Patient diagnosed with sepsis and a UTI, was started on antibiotics with cefepime.  Assessment & Plan:   Active Problems:   Sepsis ([place])  #1.  Sepsis. Completely urinary tract infection secondary to suprapubic catheter. History of C. difficile colitis. Patient currently is hemodynamic stable, most recent urine culture had E. coli, pansensitive.  I will continue current antibiotics for now.  We will narrow antibiotics when culture results available.  2.  Metastatic prostate cancer. Followed by oncology, resume Zytiga and prednisone. Oncology consult tomorrow for prognosis.    3.  Essential hypertension.  4.  Hypokalemia Patient cannot tolerate IV, will give oral 40 mEq KCl X3.     DVT prophylaxis: Lovenox Code Status: DNR Family Communication: daughter updated  Disposition Plan:    Status is: Inpatient  Remains inpatient appropriate because:IV treatments appropriate due to intensity of illness or inability to take PO and Inpatient level of care appropriate due to severity of illness  Dispo: The patient is from: Home              Anticipated d/c is to: Home              Patient currently is not medically stable to d/c.   Difficult to place patient No        I/O last 3 completed shifts: In:  4769.5 [IV Piggyback:4769.5] Out: 250 [Urine:250] Total I/O In: 600 [P.O.:600] Out: -      Consultants:  None  Procedures: None  Antimicrobials: Cefepime  Subjective: Patient feels better this morning.  No fever chills  No short of breath or cough.  No abdominal pain nausea vomiting.  No diarrhea constipation.  Objective: Vitals:   11/10/20 0130 11/10/20 0317 11/10/20 0821 11/10/20 1221  BP: 116/69 (!) 101/59 105/60 101/62  Pulse: (!) 101 (!) 110 (!) 101 100  Resp: 11 20 19 18   Temp:  98.2 F (36.8 C) 98 F (36.7 C) 98.4 F (36.9 C)  TempSrc:  Oral Oral Oral  SpO2: 99% 98% 97% 98%  Weight:      Height:        Intake/Output Summary (Last 24 hours) at 11/10/2020 1235 Last data filed at 11/10/2020 1100 Gross per 24 hour  Intake 5369.45 ml  Output 250 ml  Net 5119.45 ml   Filed Weights   11/09/20 2246  Weight: 88.9 kg    Examination:  General exam: Appears calm and comfortable  Respiratory system: Clear to auscultation. Respiratory effort normal. Cardiovascular system: S1 & S2 heard, RRR. No JVD, murmurs, rubs, gallops or clicks.  Gastrointestinal system: Abdomen is distended, soft and nontender. No organomegaly or masses felt. Normal bowel sounds heard. Central nervous system: Alert and oriented. No focal neurological deficits. Extremities: Chronic leg edema. Skin: No rashes, lesions or ulcers Psychiatry: Judgement and insight appear normal. Mood & affect  appropriate.     Data Reviewed: I have personally reviewed following labs and imaging studies  CBC: Recent Labs  Lab 11/09/20 2217 11/10/20 0632  WBC 9.3 6.8  HGB 12.3* 12.2*  HCT 36.4* 35.1*  MCV 107.7* 107.0*  PLT 216 563   Basic Metabolic Panel: Recent Labs  Lab 11/09/20 2217 11/10/20 0632  NA 133* 134*  K 3.8 2.8*  CL 96* 97*  CO2 24 26  GLUCOSE 82 69*  BUN 9 7*  CREATININE 0.89 0.63  CALCIUM 9.1 9.1   GFR: Estimated Creatinine Clearance: 103.4 mL/min (by C-G formula based on SCr  of 0.63 mg/dL). Liver Function Tests: Recent Labs  Lab 11/09/20 2217  AST 203*  ALT 36  ALKPHOS 177*  BILITOT 6.7*  PROT 6.3*  ALBUMIN 2.7*   Recent Labs  Lab 11/09/20 2217  LIPASE 21   No results for input(s): AMMONIA in the last 168 hours. Coagulation Profile: Recent Labs  Lab 11/10/20 0632  INR 2.0*   Cardiac Enzymes: No results for input(s): CKTOTAL, CKMB, CKMBINDEX, TROPONINI in the last 168 hours. BNP (last 3 results) No results for input(s): PROBNP in the last 8760 hours. HbA1C: No results for input(s): HGBA1C in the last 72 hours. CBG: No results for input(s): GLUCAP in the last 168 hours. Lipid Profile: No results for input(s): CHOL, HDL, LDLCALC, TRIG, CHOLHDL, LDLDIRECT in the last 72 hours. Thyroid Function Tests: No results for input(s): TSH, T4TOTAL, FREET4, T3FREE, THYROIDAB in the last 72 hours. Anemia Panel: No results for input(s): VITAMINB12, FOLATE, FERRITIN, TIBC, IRON, RETICCTPCT in the last 72 hours. Sepsis Labs: Recent Labs  Lab 11/09/20 2317 11/09/20 2318 11/10/20 0132 11/10/20 8756 11/10/20 0927  PROCALCITON 1.10  --   --  1.10  --   LATICACIDVEN  --  4.0* 3.9* 2.8* 2.1*    Recent Results (from the past 240 hour(s))  Resp Panel by RT-PCR (Flu A&B, Covid) Nasopharyngeal Swab     Status: None   Collection Time: 11/10/20  1:47 AM   Specimen: Nasopharyngeal Swab; Nasopharyngeal(NP) swabs in vial transport medium  Result Value Ref Range Status   SARS Coronavirus 2 by RT PCR NEGATIVE NEGATIVE Final    Comment: (NOTE) SARS-CoV-2 target nucleic acids are NOT DETECTED.  The SARS-CoV-2 RNA is generally detectable in upper respiratory specimens during the acute phase of infection. The lowest concentration of SARS-CoV-2 viral copies this assay can detect is 138 copies/mL. A negative result does not preclude SARS-Cov-2 infection and should not be used as the sole basis for treatment or other patient management decisions. A negative result  may occur with  improper specimen collection/handling, submission of specimen other than nasopharyngeal swab, presence of viral mutation(s) within the areas targeted by this assay, and inadequate number of viral copies(<138 copies/mL). A negative result must be combined with clinical observations, patient history, and epidemiological information. The expected result is Negative.  Fact Sheet for Patients:  EntrepreneurPulse.com.au  Fact Sheet for Healthcare Providers:  IncredibleEmployment.be  This test is no t yet approved or cleared by the [place] FDA and  has been authorized for detection and/or diagnosis of SARS-CoV-2 by FDA under an Emergency Use Authorization (EUA). This EUA will remain  in effect (meaning this test can be used) for the duration of the COVID-19 declaration under Section 564(b)(1) of the Act, 21 U.S.C.section 360bbb-3(b)(1), unless the authorization is terminated  or revoked sooner.       Influenza A by PCR NEGATIVE NEGATIVE Final   Influenza B  by PCR NEGATIVE NEGATIVE Final    Comment: (NOTE) The Xpert Xpress SARS-CoV-2/FLU/RSV plus assay is intended as an aid in the diagnosis of influenza from Nasopharyngeal swab specimens and should not be used as a sole basis for treatment. Nasal washings and aspirates are unacceptable for Xpert Xpress SARS-CoV-2/FLU/RSV testing.  Fact Sheet for Patients: EntrepreneurPulse.com.au  Fact Sheet for Healthcare Providers: IncredibleEmployment.be  This test is not yet approved or cleared by the [place] FDA and has been authorized for detection and/or diagnosis of SARS-CoV-2 by FDA under an Emergency Use Authorization (EUA). This EUA will remain in effect (meaning this test can be used) for the duration of the COVID-19 declaration under Section 564(b)(1) of the Act, 21 U.S.C. section 360bbb-3(b)(1), unless the authorization is terminated  or revoked.  Performed at [facility], [address], [place], [place] [address]          Radiology Studies: CT ABDOMEN PELVIS W CONTRAST  Result Date: 11/10/2020 CLINICAL DATA:  Lower abdominal pain, nausea, vomiting. Metastatic prostate cancer. EXAM: CT ABDOMEN AND PELVIS WITH CONTRAST TECHNIQUE: Multidetector CT imaging of the abdomen and pelvis was performed using the standard protocol following bolus administration of intravenous contrast. CONTRAST:  170mL OMNIPAQUE IOHEXOL 350 MG/ML SOLN COMPARISON:  None. FINDINGS: Lower chest: Stable mild elevation of the left hemidiaphragm. The visualized lung bases are clear. Moderate coronary artery calcification. Hepatobiliary: The liver contour is nodular and there is hypertrophy of the a left hepatic lobe and caudate lobe in keeping with changes of underlying cirrhosis. Since the prior examination, there has developed a markedly heterogeneous enhancement pattern within the right hepatic lobe with multiple hypoattenuating intrahepatic mass is identified. While predominantly located within the right hepatic lobe, several hypoenhancing masses are also noted within the left hepatic lobe. The dominant mass is seen within segment 5 of the liver and appears to demonstrate confluent, expansile tumor thrombus within the a portal venous system extending into the a main portal vein just proximal to the portosplenic confluence. Altogether, this is most in keeping with multifocal hepatocellular carcinoma with associated portal venous extension. Exact measurement of the primary mass is difficult to delineate on this examination. An index lesion, however, within the inferior right hepatic lobe measures 3.7 x 5.0 cm at axial image # 50/2. No intra or extrahepatic biliary ductal dilation. Gallbladder unremarkable. Pancreas: Unremarkable Spleen: The spleen is enlarged, measuring 14.4 cm in greatest dimension, similar to that noted on prior examination.  Adrenals/Urinary Tract: Bilateral adrenal nodules are unchanged measuring 20 mm on the right and 13 mm on the left. These are not well characterized on this examination. The kidneys are unremarkable. Multiple calculi are seen laying dependently within the bladder lumen, progressive since prior examination with the dominant calculus measuring 11 x 28 mm a suprapubic catheter seen within a decompressed bladder lumen. Stomach/Bowel: Distal colectomy has been performed with an anastomotic staple line seen within the splenic flexure. The stomach, small bowel, and large bowel are otherwise unremarkable. Appendix normal. Interval development of mild ascites. No free intraperitoneal gas. Vascular/Lymphatic: Moderate aortoiliac atherosclerotic calcification. No aortic aneurysm. Small gastroesophageal varices identified. Multiple shotty lymph nodes are again identified within the porta hepatis, stable since prior examination, nonspecific. Index lymph node measures 13 mm in short axis diameter at axial image # 40/2. No additional pathologic adenopathy within the abdomen and pelvis. Reproductive: Brachytherapy seeds noted within the prostate gland. Other: Small fat containing inguinal hernias again noted. Musculoskeletal: Numerable sclerotic metastases are again seen throughout the visualized  axial skeleton in keeping with the patient's known underlying metastatic prostate cancer. No pathologic fracture identified. IMPRESSION: Interval development of innumerable hepatic hypoenhancing masses with thrombosis of the main, right, and left portal veins most in keeping with multifocal hepatocellular carcinoma with portal venous extension/tumor thrombus. Tumor thrombus extends into the proximal portal vein just distal to the portal splenic confluence. Background changes of cirrhosis. Interval development of mild ascites likely reflects changes of portal venous hypertension. Correlation with serum AFP levels would be helpful in  confirming this. Contrast enhanced MRI examination would also be helpful in further evaluating this finding. Stable splenomegaly. Stable adrenal nodules, not optimally characterized on this examination. Progressive calculi within the bladder lumen. Decompression of the bladder with suprapubic catheter in appropriate position. Grossly stable innumerable sclerotic metastases throughout the visualized axial skeleton in keeping with the patient's known metastatic prostate cancer. Aortic Atherosclerosis (ICD10-I70.0). Electronically Signed   By: Fidela Salisbury MD   On: 11/10/2020 01:04   DG Chest Port 1 View  Result Date: 11/09/2020 CLINICAL DATA:  Lower abdominal pain EXAM: PORTABLE CHEST 1 VIEW COMPARISON:  10/12/2020, 08/17/2016 FINDINGS: Chronic elevation left diaphragm with pleural scarring. No focal opacity or pleural effusion. Stable cardiomediastinal silhouette. No pneumothorax. Sclerotic bony metastatic disease redemonstrated. IMPRESSION: No active disease.  Sclerotic bony metastatic disease. Electronically Signed   By: Donavan Foil M.D.   On: 11/09/2020 23:57        Scheduled Meds:  abiraterone acetate  1,000 mg Oral Daily   diltiazem  240 mg Oral Daily   enoxaparin (LOVENOX) injection  40 mg Subcutaneous Q24H   oxybutynin  5 mg Oral QID   potassium chloride  40 mEq Oral Q4H   Continuous Infusions:  ceFEPime (MAXIPIME) IV 2 g (11/10/20 0952)   vancomycin       LOS: 0 days    Time spent: No charge    Sharen Hones, MD [facility]   To contact the attending provider between 7A-7P or the covering provider during after hours 7P-7A, please log into the web site [email or web address] and access using universal [place] password for that web site. If you do not have the password, please call the hospital operator.  11/10/2020, 12:35 PM

## 2020-11-10 NOTE — ED Notes (Signed)
Pt. Had new onset, left sided CP, Dr. Karma Greaser notified, EKG obtained. Will await orders.

## 2020-11-10 NOTE — Consult Note (Signed)
[facility] CONSULT NOTE  Patient Care Team: Jonetta Osgood, NP as PCP - General (Nurse Practitioner) Lloyd Huger, MD as Consulting Physician (Oncology)  CHIEF COMPLAINTS/PURPOSE OF CONSULTATION: Multiple liver lesions  HISTORY OF PRESENTING ILLNESS:  Gerald Odonnell 65 y.o.  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  Patient states that he has been having abdominal pain for the last few weeks-which progressively has been getting worse.  He denies any blood in stools.   CT scan shows-innumerable hepatic hypoenhancing masses with thrombus of the right main left portal veins most consistent with hepatocellular cancer in the context of cirrhosis.  He complains of left arm pain attributable to IV potassium supplementation.   Review of Systems  Constitutional:  Positive for malaise/fatigue and weight loss. Negative for chills, diaphoresis and fever.  HENT:  Negative for nosebleeds and sore throat.   Eyes:  Negative for double vision.  Respiratory:  Positive for cough and shortness of breath. Negative for hemoptysis and wheezing.   Cardiovascular:  Negative for chest pain, palpitations, orthopnea and leg swelling.  Gastrointestinal:  Positive for abdominal pain, constipation and nausea. Negative for blood in stool, diarrhea, heartburn, melena and vomiting.  Genitourinary:  Negative for dysuria, frequency and urgency.  Musculoskeletal:  Positive for back pain and joint pain.  Skin: Negative.  Negative for itching and rash.  Neurological:  Positive for weakness. Negative for dizziness, tingling, focal weakness and headaches.  Endo/Heme/Allergies:  Does not bruise/bleed easily.  Psychiatric/Behavioral:  Negative for depression. The patient is not nervous/anxious and does not have insomnia.     MEDICAL  HISTORY:  Past Medical History:  Diagnosis Date   Cancer ([place])    colon   Cancer ([place])    bone cancer   Colon cancer ([place])    Hx of   History of kidney stones    Hypertension    Prostate cancer ([place])    Prostate cancer ([place])    Hx of    S/P colon resection    Shortness of breath dyspnea    Suprapubic catheter [facility])    Urinary retention     SURGICAL HISTORY: Past Surgical History:  Procedure Laterality Date   ABDOMINAL SURGERY     COLON SURGERY     CYSTOSCOPY WITH LITHOLAPAXY N/A 06/09/2017   Procedure: CYSTOSCOPY WITH LITHOLAPAXY;  Surgeon: Hollice Espy, MD;  Location: [facility] ORS;  Service: Urology;  Laterality: N/A;   PROSTATE SURGERY  05/25/2016   SUPRAPUBIC CATHETER INSERTION      SOCIAL HISTORY: Social History   Socioeconomic History   Marital status: Widowed    Spouse name: Not on file   Number of children: Not on file   Years of education: Not on file   Highest education level: Not on file  Occupational History   Not on file  Tobacco Use   Smoking status: Every Day    Packs/day: 0.25    Years: 40.00    Pack years: 10.00    Types: Cigarettes   Smokeless tobacco: Never   Tobacco comments:    1pack a week  Vaping Use   Vaping Use: Never used  Substance and Sexual Activity   Alcohol use: Yes   Drug use: No   Sexual activity: Not on file  Other Topics Concern   Not on file  Social History Narrative   ** Merged History Encounter **  Social Determinants of Health   Financial Resource Strain: Not on file  Food Insecurity: Not on file  Transportation Needs: Not on file  Physical Activity: Not on file  Stress: Not on file  Social Connections: Not on file  Intimate Partner Violence: Not on file    FAMILY HISTORY: Family History  Problem Relation Age of Onset   Hypertension Other    Alcohol abuse Mother    Heart attack Father    Prostate cancer Brother    Breast cancer Sister     ALLERGIES:  has No Known Allergies.  MEDICATIONS:   Current Facility-Administered Medications  Medication Dose Route Frequency Provider Last Rate Last Admin   abiraterone acetate (ZYTIGA) tablet 1,000 mg  1,000 mg Oral Daily Sharen Hones, MD   1,000 mg at 11/10/20 1324   acetaminophen (TYLENOL) tablet 650 mg  650 mg Oral Q6H PRN Mansy, Jan A, MD       Or   acetaminophen (TYLENOL) suppository 650 mg  650 mg Rectal Q6H PRN Mansy, Jan A, MD       ceFEPIme (MAXIPIME) 2 g in sodium chloride 0.9 % 100 mL IVPB  2 g Intravenous Q8H Mansy, Jan A, MD 200 mL/hr at 11/10/20 1753 2 g at 11/10/20 1753   diltiazem (CARDIZEM CD) 24 hr capsule 240 mg  240 mg Oral Daily Mansy, Jan A, MD   240 mg at 11/10/20 0932   enoxaparin (LOVENOX) injection 40 mg  40 mg Subcutaneous Q24H Mansy, Jan A, MD   40 mg at 11/10/20 0932   magnesium hydroxide (MILK OF MAGNESIA) suspension 30 mL  30 mL Oral Daily PRN Mansy, Jan A, MD       ondansetron [facility]) tablet 4 mg  4 mg Oral Q6H PRN Mansy, Jan A, MD       Or   ondansetron [facility]) injection 4 mg  4 mg Intravenous Q6H PRN Mansy, Jan A, MD       oxybutynin (DITROPAN) tablet 5 mg  5 mg Oral QID Mansy, Jan A, MD   5 mg at 11/10/20 1750   oxyCODONE-acetaminophen (PERCOCET/ROXICET) 5-325 MG per tablet 1 tablet  1 tablet Oral Q4H PRN Sharen Hones, MD   1 tablet at 11/10/20 1550   potassium chloride (KLOR-CON) packet 40 mEq  40 mEq Oral Q4H Sharen Hones, MD   40 mEq at 11/10/20 1750   traZODone (DESYREL) tablet 25 mg  25 mg Oral QHS PRN Mansy, Jan A, MD          .  PHYSICAL EXAMINATION:  Vitals:   11/10/20 1221 11/10/20 1535  BP: 101/62 109/60  Pulse: 100 85  Resp: 18 17  Temp: 98.4 F (36.9 C) 98.2 F (36.8 C)  SpO2: 98% 98%   Filed Weights   11/09/20 2246  Weight: 196 lb (88.9 kg)    Physical Exam   LABORATORY DATA:  I have reviewed the data as listed Lab Results  Component Value Date   WBC 6.8 11/10/2020   HGB 12.2 (L) 11/10/2020   HCT 35.1 (L) 11/10/2020   MCV 107.0 (H) 11/10/2020   PLT 194 11/10/2020    Recent Labs    01/19/20 1006 04/24/20 0948 10/03/20 0805 10/04/20 0624 10/06/20 0450 10/07/20 0430 10/14/20 0458 11/09/20 2217 11/10/20 0632  NA 137   < > 132*   < > 138   < > 140 133* 134*  K 4.1   < > 3.3*   < > 3.6   < > 3.2*  3.8 2.8*  CL 102   < > 100   < > 115*   < > 107 96* 97*  CO2 24   < > 23   < > 19*   < > 30 24 26   GLUCOSE 113*   < > 118*   < > 67*   < > 77 82 69*  BUN 8   < > 10   < > 14   < > 12 9 7*  CREATININE 0.64   < > 0.63   < > 0.51*   < > 0.48* 0.89 0.63  CALCIUM 9.1   < > 8.9   < > 8.4*   < > 7.9* 9.1 9.1  GFRNONAA >60   < > >60   < > >60   < > >60 >60 >60  GFRAA >60  --   --   --   --   --   --   --   --   PROT 7.4   < > 6.6  --  5.6*  --   --  6.3*  --   ALBUMIN 3.8   < > 3.0*  --  2.5*  --   --  2.7*  --   AST 34   < > 53*  --  39  --   --  203*  --   ALT 27   < > 25  --  22  --   --  36  --   ALKPHOS 99   < > 136*  --  77  --   --  177*  --   BILITOT 0.9   < > 3.8*  --  1.6*  --   --  6.7*  --    < > = values in this interval not displayed.    RADIOGRAPHIC STUDIES: I have personally reviewed the radiological images as listed and agreed with the findings in the report. CT ABDOMEN PELVIS W CONTRAST  Result Date: 11/10/2020 CLINICAL DATA:  Lower abdominal pain, nausea, vomiting. Metastatic prostate cancer. EXAM: CT ABDOMEN AND PELVIS WITH CONTRAST TECHNIQUE: Multidetector CT imaging of the abdomen and pelvis was performed using the standard protocol following bolus administration of intravenous contrast. CONTRAST:  111mL OMNIPAQUE IOHEXOL 350 MG/ML SOLN COMPARISON:  None. FINDINGS: Lower chest: Stable mild elevation of the left hemidiaphragm. The visualized lung bases are clear. Moderate coronary artery calcification. Hepatobiliary: The liver contour is nodular and there is hypertrophy of the a left hepatic lobe and caudate lobe in keeping with changes of underlying cirrhosis. Since the prior examination, there has developed a markedly heterogeneous  enhancement pattern within the right hepatic lobe with multiple hypoattenuating intrahepatic mass is identified. While predominantly located within the right hepatic lobe, several hypoenhancing masses are also noted within the left hepatic lobe. The dominant mass is seen within segment 5 of the liver and appears to demonstrate confluent, expansile tumor thrombus within the a portal venous system extending into the a main portal vein just proximal to the portosplenic confluence. Altogether, this is most in keeping with multifocal hepatocellular carcinoma with associated portal venous extension. Exact measurement of the primary mass is difficult to delineate on this examination. An index lesion, however, within the inferior right hepatic lobe measures 3.7 x 5.0 cm at axial image # 50/2. No intra or extrahepatic biliary ductal dilation. Gallbladder unremarkable. Pancreas: Unremarkable Spleen: The spleen is enlarged, measuring 14.4 cm in greatest dimension, similar to that noted on prior examination. Adrenals/Urinary Tract:  Bilateral adrenal nodules are unchanged measuring 20 mm on the right and 13 mm on the left. These are not well characterized on this examination. The kidneys are unremarkable. Multiple calculi are seen laying dependently within the bladder lumen, progressive since prior examination with the dominant calculus measuring 11 x 28 mm a suprapubic catheter seen within a decompressed bladder lumen. Stomach/Bowel: Distal colectomy has been performed with an anastomotic staple line seen within the splenic flexure. The stomach, small bowel, and large bowel are otherwise unremarkable. Appendix normal. Interval development of mild ascites. No free intraperitoneal gas. Vascular/Lymphatic: Moderate aortoiliac atherosclerotic calcification. No aortic aneurysm. Small gastroesophageal varices identified. Multiple shotty lymph nodes are again identified within the porta hepatis, stable since prior examination,  nonspecific. Index lymph node measures 13 mm in short axis diameter at axial image # 40/2. No additional pathologic adenopathy within the abdomen and pelvis. Reproductive: Brachytherapy seeds noted within the prostate gland. Other: Small fat containing inguinal hernias again noted. Musculoskeletal: Numerable sclerotic metastases are again seen throughout the visualized axial skeleton in keeping with the patient's known underlying metastatic prostate cancer. No pathologic fracture identified. IMPRESSION: Interval development of innumerable hepatic hypoenhancing masses with thrombosis of the main, right, and left portal veins most in keeping with multifocal hepatocellular carcinoma with portal venous extension/tumor thrombus. Tumor thrombus extends into the proximal portal vein just distal to the portal splenic confluence. Background changes of cirrhosis. Interval development of mild ascites likely reflects changes of portal venous hypertension. Correlation with serum AFP levels would be helpful in confirming this. Contrast enhanced MRI examination would also be helpful in further evaluating this finding. Stable splenomegaly. Stable adrenal nodules, not optimally characterized on this examination. Progressive calculi within the bladder lumen. Decompression of the bladder with suprapubic catheter in appropriate position. Grossly stable innumerable sclerotic metastases throughout the visualized axial skeleton in keeping with the patient's known metastatic prostate cancer. Aortic Atherosclerosis (ICD10-I70.0). Electronically Signed   By: Fidela Salisbury MD   On: 11/10/2020 01:04   DG Chest Port 1 View  Result Date: 11/09/2020 CLINICAL DATA:  Lower abdominal pain EXAM: PORTABLE CHEST 1 VIEW COMPARISON:  10/12/2020, 08/17/2016 FINDINGS: Chronic elevation left diaphragm with pleural scarring. No focal opacity or pleural effusion. Stable cardiomediastinal silhouette. No pneumothorax. Sclerotic bony metastatic disease  redemonstrated. IMPRESSION: No active disease.  Sclerotic bony metastatic disease. Electronically Signed   By: Donavan Foil M.D.   On: 11/09/2020 23:57   DG Chest Port 1 View  Result Date: 10/12/2020 CLINICAL DATA:  Vomiting.  Recent sepsis EXAM: PORTABLE CHEST 1 VIEW COMPARISON:  10/04/2020 FINDINGS: Moderate bibasilar airspace disease. Small bilateral effusions. This has progressed significantly in the interval. Cardiac enlargement with vascular congestion. IMPRESSION: Vascular congestion. Progressive bibasilar atelectasis and effusion. Possible fluid overload versus pneumonia. Electronically Signed   By: Franchot Gallo M.D.   On: 10/12/2020 11:31   DG Abd Portable 1V  Result Date: 10/12/2020 CLINICAL DATA:  Patient with vomiting and abdominal pain. History of colon cancer. EXAM: PORTABLE ABDOMEN - 1 VIEW COMPARISON:  CT abdomen pelvis 05/15/2020. FINDINGS: There is gaseous distension of the colon particularly the cecum and transverse colon. No significant small bowel gaseous distension. Lumbar spine degenerative changes. IMPRESSION: Nonspecific bowel gas pattern with gaseous distension of the ascending and transverse colon. Electronically Signed   By: Lovey Newcomer M.D.   On: 10/12/2020 12:53    Liver lesion #65 year old male patient with multiple medical problems-including CHF COPD/home O2; cirrhosis; metastatic prostate cancer-is currently admitted to hospital for worsening  abdominal pain/concern for sepsis-CT scan showed multiple liver lesions concerning for malignancy  #Multiple liver lesions with tumor involving the portal vein- in the context of cirrhosis-highly concerning for malignancy especially hepatocellular carcinoma.  #Cirrhosis/liver decompensation- coagulopathy elevated bilirubin/likely secondary to #1.   #Abdominal pain-secondary to #1.  #Lactic acidosis-question sepsis versus underlying hepatic malignancy.  #Metastatic prostate cancer-on Zytiga plus prednisone  #Chronic  respiratory failure CHF COPD  Recommendation:  # A long discussion with the patient regarding my significant concerns for a new primary likely Deport in the context of his cirrhosis.  Discussed that this is most likely not related to his underlying prostate cancer.  Patient understands the gravity of the illness.  If patient is interested in further work-up we will plan MRI liver when patient is more clinically stable.  #Given the severe hypokalemia-I think is reasonable to discontinue Zytiga plus prednisone at this time.  # On further discussion patient states that he wants to "die at home".  Patient lives alone; patient at this time wants [name] to discuss his medical care with his neighbor, Sonia Side; rather his daughter out of state/or sister-[place].  Recommend evaluation palliative care in the morning.  And plan family meeting tomorrow.  I will inform Josh Borders in the morning.   Thank you Dr.Zhang for allowing me to participate in the care of your pleasant patient. Please do not hesitate to contact me with questions or concerns in the interim.  # I reviewed the blood work- with the patient in detail; also reviewed the imaging independently [as summarized above]; and with the patient in detail.      All questions were answered. The patient knows to call the clinic with any problems, questions or concerns.    Cammie Sickle, MD 11/10/2020 8:30 PM

## 2020-11-10 NOTE — H&P (Addendum)
[place]   PATIENT NAME: Gerald Odonnell    MR#:  [id number]  DATE OF BIRTH:  1955/08/14  DATE OF ADMISSION:  11/09/2020  PRIMARY CARE PHYSICIAN: Jonetta Osgood, NP   Patient is coming from: Home  REQUESTING/REFERRING PHYSICIAN: Hinda Kehr, MD CHIEF COMPLAINT:   Chief Complaint  Patient presents with  . Emesis  . Abdominal Pain    HISTORY OF PRESENT ILLNESS:  Gerald Odonnell is a 65 y.o. male with medical history significant for metastatic prostate cancer to the bone followed by Dr. Grayland Ormond, with history of colon cancer, nephrolithiasis, hypertension on chronic suprapubic catheter, who presented to the emergency room with acute onset of lower abdominal pain mainly in the suprapubic area with associated nausea and dry heaves without diarrhea.  He stated that he has no appetite.  He is not eating a sandwich and couple weeks.  He denied any fever however has been diaphoretic.  His urine has been dark and occasionally reddish.  No chest pain or palpitations.  No cough or wheezing or hemoptysis.  No other bleeding diathesis.  ED Course: When he came to the ER heart rate was 107 with respiratory to 24 with otherwise normal vital signs.  Labs revealed anemia, procalcitonin of 1.1 and high-sensitivity troponin I of 9.  CMP revealed an alk phos of 177 with albumin of 2.7 and total protein of 6.3.  AST was 203 ALT 386 with total bili of 6.7.  Urinalysis was positive for UTI.  Urine culture was sent. EKG as reviewed by me : Showed sinus tachycardia with rate of 105 with ventricular bigeminy Imaging: Chest x-ray showed sclerotic bony metastatic disease with no acute cardiopulmonary abnormality. Abdominal and pelvic CT scan with contrast revealed the following:  IMPRESSION: Interval development of innumerable hepatic hypoenhancing masses with thrombosis of the main, right, and left portal veins most in keeping with multifocal hepatocellular carcinoma with portal  venous extension/tumor thrombus. Tumor thrombus extends into the proximal portal vein just distal to the portal splenic confluence. Background changes of cirrhosis. Interval development of mild ascites likely reflects changes of portal venous hypertension. Correlation with serum AFP levels would be helpful in confirming this. Contrast enhanced MRI examination would also be helpful in further evaluating this finding.   Stable splenomegaly.   Stable adrenal nodules, not optimally characterized on this examination.   Progressive calculi within the bladder lumen. Decompression of the bladder with suprapubic catheter in appropriate position.   Grossly stable innumerable sclerotic metastases throughout the visualized axial skeleton in keeping with the patient's known metastatic prostate cancer.   Aortic Atherosclerosis.  The patient was given IV cefepime and Flagyl as well as vancomycin for broad-spectrum coverage initially, 1 L bolus of IV lactated ringer and 1 L of IV normal saline, 4 mg of IV morphine sulfate twice and 4 mg IV Zofran.  He will be admitted to a progressive unit bed for further evaluation and management.  Past Medical History:  Diagnosis Date  . Cancer ([place])    colon  . Cancer ([place])    bone cancer  . Colon cancer ([place])    Hx of  . History of kidney stones   . Hypertension   . Prostate cancer ([place])   . Prostate cancer ([place])    Hx of   . S/P colon resection   . Shortness of breath dyspnea   . Suprapubic catheter ([place])   . Urinary retention     PAST SURGICAL HISTORY:   Past  Surgical History:  Procedure Laterality Date  . ABDOMINAL SURGERY    . COLON SURGERY    . CYSTOSCOPY WITH LITHOLAPAXY N/A 06/09/2017   Procedure: CYSTOSCOPY WITH LITHOLAPAXY;  Surgeon: Hollice Espy, MD;  Location: [facility] ORS;  Service: Urology;  Laterality: N/A;  . PROSTATE SURGERY  05/25/2016  . SUPRAPUBIC CATHETER INSERTION      SOCIAL HISTORY:   Social History   Tobacco Use   . Smoking status: Every Day    Packs/day: 0.25    Years: 40.00    Pack years: 10.00    Types: Cigarettes  . Smokeless tobacco: Never  . Tobacco comments:    1pack a week  Substance Use Topics  . Alcohol use: Yes    FAMILY HISTORY:   Family History  Problem Relation Age of Onset  . Hypertension Other   . Alcohol abuse Mother   . Heart attack Father   . Prostate cancer Brother   . Breast cancer Sister     DRUG ALLERGIES:  No Known Allergies  REVIEW OF SYSTEMS:   ROS As per history of present illness. All pertinent systems were reviewed above. Constitutional, HEENT, cardiovascular, respiratory, GI, GU, musculoskeletal, neuro, psychiatric, endocrine, integumentary and hematologic systems were reviewed and are otherwise negative/unremarkable except for positive findings mentioned above in the HPI.   MEDICATIONS AT HOME:   Prior to Admission medications   Medication Sig Start Date End Date Taking? Authorizing Provider  abiraterone acetate (ZYTIGA) 250 MG tablet TAKE 4 TABLETS (1,000 MG TOTAL) BY MOUTH DAILY. TAKE ON AN EMPTY STOMACH 1 HOUR BEFORE OR 2 HOURS AFTER A MEAL. 11/01/20 11/01/21 Yes Verlon Au, NP  furosemide (LASIX) 20 MG tablet Take 1 tablet (20 mg total) by mouth 2 (two) times daily. 09/11/20  Yes Abernathy, Yetta Flock, NP  oxybutynin (DITROPAN) 5 MG tablet Take 1 tablet (5 mg total) by mouth 4 (four) times daily. 09/11/20  Yes Abernathy, Yetta Flock, NP  predniSONE (DELTASONE) 5 MG tablet TAKE 1 TABLET BY MOUTH EVERYDAY WITH BREAKFAST 11/01/20 11/01/21 Yes Verlon Au, NP  acetaminophen (TYLENOL) 325 MG tablet Take 2 tablets (650 mg total) by mouth every 6 (six) hours as needed for mild pain or moderate pain (or Fever >/= 101). Patient not taking: Reported on 11/10/2020 10/14/20   Elgergawy, Silver Huguenin, MD  Cholecalciferol (VITAMIN D) 125 MCG (5000 UT) CAPS Take 1 capsule by mouth daily. Patient not taking: Reported on 11/10/2020    [provider]  diltiazem (CARDIZEM  CD) 240 MG 24 hr capsule Take 1 capsule (240 mg total) by mouth daily. Patient not taking: Reported on 11/10/2020 10/11/20   Fritzi Mandes, MD  docusate sodium (COLACE) 100 MG capsule Take 1 capsule (100 mg total) by mouth 2 (two) times daily as needed for mild constipation. Patient not taking: Reported on 11/10/2020 10/14/20   Elgergawy, Silver Huguenin, MD  metoprolol tartrate (LOPRESSOR) 25 MG tablet Take 0.5 tablets (12.5 mg total) by mouth 2 (two) times daily. Patient not taking: Reported on 11/10/2020 10/14/20   Elgergawy, Silver Huguenin, MD  potassium chloride (KLOR-CON) 10 MEQ tablet Take 1 tablet (10 mEq total) by mouth daily. Patient not taking: Reported on 11/10/2020 10/15/20   Elgergawy, Silver Huguenin, MD  senna (SENOKOT) 8.6 MG TABS tablet Take 1 tablet by mouth daily. Patient not taking: Reported on 11/10/2020    [provider]  amiodarone (PACERONE) 200 MG tablet Take 1 tablet (200 mg total) by mouth 2 (two) times daily. 10/10/20 10/10/20  Fritzi Mandes,  MD  apixaban (ELIQUIS) 5 MG TABS tablet Take 1 tablet (5 mg total) by mouth 2 (two) times daily. 10/10/20 10/10/20  Fritzi Mandes, MD      VITAL SIGNS:  Blood pressure (!) 101/59, pulse (!) 110, temperature 98.2 F (36.8 C), temperature source Oral, resp. rate 20, height '5\' 10"'  (1.778 m), weight 88.9 kg, SpO2 98 %.  PHYSICAL EXAMINATION:  Physical Exam  GENERAL:  65 y.o.-year-old Caucasian male patient lying in the bed with no acute distress.  EYES: Pupils equal, round, reactive to light and accommodation. No scleral icterus. Extraocular muscles intact.  HEENT: Head atraumatic, normocephalic. Oropharynx and nasopharynx clear.  NECK:  Supple, no jugular venous distention. No thyroid enlargement, no tenderness.  LUNGS: Normal breath sounds bilaterally, no wheezing, rales,rhonchi or crepitation. No use of accessory muscles of respiration.  CARDIOVASCULAR: Regular rate and rhythm, S1, S2 normal. No murmurs, rubs, or gallops.  ABDOMEN: Soft, nondistended,  with diffuse abdominal tenderness mainly in the suprapubic area.  Bowel sounds present. No organomegaly or mass.  EXTREMITIES: 1+ bilateral lower extremity pitting edema with no cyanosis, or clubbing.  NEUROLOGIC: Cranial nerves II through XII are intact. Muscle strength 5/5 in all extremities. Sensation intact. Gait not checked.  PSYCHIATRIC: The patient is alert and oriented x 3.  Normal affect and good eye contact. SKIN: No obvious rash, lesion, or ulcer.   LABORATORY PANEL:   CBC Recent Labs  Lab 11/09/20 2217  WBC 9.3  HGB 12.3*  HCT 36.4*  PLT 216   ------------------------------------------------------------------------------------------------------------------  Chemistries  Recent Labs  Lab 11/09/20 2217  NA 133*  K 3.8  CL 96*  CO2 24  GLUCOSE 82  BUN 9  CREATININE 0.89  CALCIUM 9.1  AST 203*  ALT 36  ALKPHOS 177*  BILITOT 6.7*   ------------------------------------------------------------------------------------------------------------------  Cardiac Enzymes No results for input(s): TROPONINI in the last 168 hours. ------------------------------------------------------------------------------------------------------------------  RADIOLOGY:  CT ABDOMEN PELVIS W CONTRAST  Result Date: 11/10/2020 CLINICAL DATA:  Lower abdominal pain, nausea, vomiting. Metastatic prostate cancer. EXAM: CT ABDOMEN AND PELVIS WITH CONTRAST TECHNIQUE: Multidetector CT imaging of the abdomen and pelvis was performed using the standard protocol following bolus administration of intravenous contrast. CONTRAST:  152m OMNIPAQUE IOHEXOL 350 MG/ML SOLN COMPARISON:  None. FINDINGS: Lower chest: Stable mild elevation of the left hemidiaphragm. The visualized lung bases are clear. Moderate coronary artery calcification. Hepatobiliary: The liver contour is nodular and there is hypertrophy of the a left hepatic lobe and caudate lobe in keeping with changes of underlying cirrhosis. Since the prior  examination, there has developed a markedly heterogeneous enhancement pattern within the right hepatic lobe with multiple hypoattenuating intrahepatic mass is identified. While predominantly located within the right hepatic lobe, several hypoenhancing masses are also noted within the left hepatic lobe. The dominant mass is seen within segment 5 of the liver and appears to demonstrate confluent, expansile tumor thrombus within the a portal venous system extending into the a main portal vein just proximal to the portosplenic confluence. Altogether, this is most in keeping with multifocal hepatocellular carcinoma with associated portal venous extension. Exact measurement of the primary mass is difficult to delineate on this examination. An index lesion, however, within the inferior right hepatic lobe measures 3.7 x 5.0 cm at axial image # 50/2. No intra or extrahepatic biliary ductal dilation. Gallbladder unremarkable. Pancreas: Unremarkable Spleen: The spleen is enlarged, measuring 14.4 cm in greatest dimension, similar to that noted on prior examination. Adrenals/Urinary Tract: Bilateral adrenal nodules are unchanged measuring 20  mm on the right and 13 mm on the left. These are not well characterized on this examination. The kidneys are unremarkable. Multiple calculi are seen laying dependently within the bladder lumen, progressive since prior examination with the dominant calculus measuring 11 x 28 mm a suprapubic catheter seen within a decompressed bladder lumen. Stomach/Bowel: Distal colectomy has been performed with an anastomotic staple line seen within the splenic flexure. The stomach, small bowel, and large bowel are otherwise unremarkable. Appendix normal. Interval development of mild ascites. No free intraperitoneal gas. Vascular/Lymphatic: Moderate aortoiliac atherosclerotic calcification. No aortic aneurysm. Small gastroesophageal varices identified. Multiple shotty lymph nodes are again identified within  the porta hepatis, stable since prior examination, nonspecific. Index lymph node measures 13 mm in short axis diameter at axial image # 40/2. No additional pathologic adenopathy within the abdomen and pelvis. Reproductive: Brachytherapy seeds noted within the prostate gland. Other: Small fat containing inguinal hernias again noted. Musculoskeletal: Numerable sclerotic metastases are again seen throughout the visualized axial skeleton in keeping with the patient's known underlying metastatic prostate cancer. No pathologic fracture identified. IMPRESSION: Interval development of innumerable hepatic hypoenhancing masses with thrombosis of the main, right, and left portal veins most in keeping with multifocal hepatocellular carcinoma with portal venous extension/tumor thrombus. Tumor thrombus extends into the proximal portal vein just distal to the portal splenic confluence. Background changes of cirrhosis. Interval development of mild ascites likely reflects changes of portal venous hypertension. Correlation with serum AFP levels would be helpful in confirming this. Contrast enhanced MRI examination would also be helpful in further evaluating this finding. Stable splenomegaly. Stable adrenal nodules, not optimally characterized on this examination. Progressive calculi within the bladder lumen. Decompression of the bladder with suprapubic catheter in appropriate position. Grossly stable innumerable sclerotic metastases throughout the visualized axial skeleton in keeping with the patient's known metastatic prostate cancer. Aortic Atherosclerosis (ICD10-I70.0). Electronically Signed   By: Fidela Salisbury MD   On: 11/10/2020 01:04   DG Chest Port 1 View  Result Date: 11/09/2020 CLINICAL DATA:  Lower abdominal pain EXAM: PORTABLE CHEST 1 VIEW COMPARISON:  10/12/2020, 08/17/2016 FINDINGS: Chronic elevation left diaphragm with pleural scarring. No focal opacity or pleural effusion. Stable cardiomediastinal silhouette. No  pneumothorax. Sclerotic bony metastatic disease redemonstrated. IMPRESSION: No active disease.  Sclerotic bony metastatic disease. Electronically Signed   By: Donavan Foil M.D.   On: 11/09/2020 23:57      IMPRESSION AND PLAN:  Active Problems:   Sepsis ([place])  1.  Sepsis due to UTI.  This is manifested by tachycardia and tachypnea.  The patient has a lactic acid of 4 that makes him meet criteria for severe sepsis. - The patient will be admitted to a progressive unit bed. - He will be hydrated with IV normal saline. - We will continue antibiotic therapy with IV cefepime and Flagyl. - We will follow blood and urine cultures.  2.  Metastatic prostate cancer and suspected hepatic cell carcinoma with portal venous extension/tumor thrombus extending into the portal vein just distal to the portal splenic confluence with associated liver cirrhosis and mild ascites. - Pain management to be provided. - Oncology consult will be obtained. - I notified Dr. Rogue Bussing the patient. - We will continue about his Zytiga and prednisone.  3.  Essential hypertension. - We will continue Cardizem CD and Lopressor.  DVT prophylaxis: Lovenox.  Code Status: This was discussed with the patient he desires to be DNR/DNI. Family Communication:  The plan of care was discussed in details with  the patient (and family). I answered all questions. The patient agreed to proceed with the above mentioned plan. Further management will depend upon hospital course. Disposition Plan: Back to previous home environment Consults called: Oncology consult. All the records are reviewed and case discussed with ED provider.  Status is: Inpatient  Remains inpatient appropriate because:Altered mental status, Ongoing diagnostic testing needed not appropriate for outpatient work up, Unsafe d/c plan, IV treatments appropriate due to intensity of illness or inability to take PO, and Inpatient level of care appropriate due to severity of  illness  Dispo: The patient is from: Home              Anticipated d/c is to: Home              Patient currently is not medically stable to d/c.   Difficult to place patient No   TOTAL TIME TAKING CARE OF THIS PATIENT: 55 minutes.    Christel Mormon M.D on 11/10/2020 at 3:52 AM  [facility]   From 7 PM-7 AM, contact night-coverage [email or web address]  CC: Primary care physician; Jonetta Osgood, NP

## 2020-11-10 NOTE — ED Notes (Signed)
Urine specimen obtained via leg bag, Dr. Karma Greaser states ok.

## 2020-11-10 NOTE — ED Notes (Signed)
Lab critical value Lactic Acid 4.0 Dr. Karma Greaser notified at [facility]

## 2020-11-10 NOTE — ED Notes (Signed)
MD states do not keep trying for second set of blood cx's.

## 2020-11-10 NOTE — Assessment & Plan Note (Addendum)
#  65 year old male patient with multiple medical problems-including CHF COPD/home O2; cirrhosis; metastatic prostate cancer-is currently admitted to hospital for worsening abdominal pain/concern for sepsis-CT scan showed multiple liver lesions concerning for malignancy  # Multiple liver lesions with tumor involving the portal vein- in the context of cirrhosis-highly concerning for malignancy especially hepatocellular carcinoma-AFP 20,000+/see below  #Cirrhosis/liver decompensation- coagulopathy elevated bilirubin/likely secondary to #1/see below  #Abdominal pain-secondary to #1.  Stable continue current pain medication.  #Metastatic prostate cancer-on Zytiga plus prednisone-discontinued.  PSA stable at 3.5.  #Chronic respiratory failure CHF COPD  PLAN:   #I had a long discussion with the patient and wife regarding the serious concerns for advanced liver malignancy-for which there are no good therapeutic options.  Given his elevated bilirubin/portal vein thrombosis patient not a candidate for any local therapy for the liver.  Systemic therapy options are again limited-with elevated bilirubin/liver failure.  Discussed regarding further imaging/biopsy-however given no good treatment options-I think it is reasonable to forego further imaging//biopsy as this would not change the management.  Discussed with patient/and wife regarding hospice.  They are still undecided.  Will inform Josh in the morning regarding to follow up.

## 2020-11-11 ENCOUNTER — Encounter: Payer: Self-pay | Admitting: Family Medicine

## 2020-11-11 DIAGNOSIS — R112 Nausea with vomiting, unspecified: Secondary | ICD-10-CM

## 2020-11-11 DIAGNOSIS — R109 Unspecified abdominal pain: Secondary | ICD-10-CM | POA: Diagnosis not present

## 2020-11-11 DIAGNOSIS — G9340 Encephalopathy, unspecified: Secondary | ICD-10-CM

## 2020-11-11 DIAGNOSIS — K769 Liver disease, unspecified: Secondary | ICD-10-CM | POA: Diagnosis not present

## 2020-11-11 DIAGNOSIS — A419 Sepsis, unspecified organism: Secondary | ICD-10-CM | POA: Diagnosis not present

## 2020-11-11 DIAGNOSIS — J9610 Chronic respiratory failure, unspecified whether with hypoxia or hypercapnia: Secondary | ICD-10-CM | POA: Diagnosis not present

## 2020-11-11 LAB — CBC WITH DIFFERENTIAL/PLATELET
Abs Immature Granulocytes: 0.03 10*3/uL (ref 0.00–0.07)
Basophils Absolute: 0 10*3/uL (ref 0.0–0.1)
Basophils Relative: 0 %
Eosinophils Absolute: 0 10*3/uL (ref 0.0–0.5)
Eosinophils Relative: 0 %
HCT: 35.1 % — ABNORMAL LOW (ref 39.0–52.0)
Hemoglobin: 11.5 g/dL — ABNORMAL LOW (ref 13.0–17.0)
Immature Granulocytes: 0 %
Lymphocytes Relative: 9 %
Lymphs Abs: 0.6 10*3/uL — ABNORMAL LOW (ref 0.7–4.0)
MCH: 35.6 pg — ABNORMAL HIGH (ref 26.0–34.0)
MCHC: 32.8 g/dL (ref 30.0–36.0)
MCV: 108.7 fL — ABNORMAL HIGH (ref 80.0–100.0)
Monocytes Absolute: 0.7 10*3/uL (ref 0.1–1.0)
Monocytes Relative: 10 %
Neutro Abs: 5.4 10*3/uL (ref 1.7–7.7)
Neutrophils Relative %: 81 %
Platelets: 187 10*3/uL (ref 150–400)
RBC: 3.23 MIL/uL — ABNORMAL LOW (ref 4.22–5.81)
RDW: 14.7 % (ref 11.5–15.5)
WBC: 6.7 10*3/uL (ref 4.0–10.5)
nRBC: 0 % (ref 0.0–0.2)

## 2020-11-11 LAB — BASIC METABOLIC PANEL
Anion gap: 8 (ref 5–15)
BUN: 8 mg/dL (ref 8–23)
CO2: 24 mmol/L (ref 22–32)
Calcium: 8.9 mg/dL (ref 8.9–10.3)
Chloride: 102 mmol/L (ref 98–111)
Creatinine, Ser: 0.61 mg/dL (ref 0.61–1.24)
GFR, Estimated: >60 mL/min (ref >60)
Glucose, Bld: 63 mg/dL — ABNORMAL LOW (ref 70–99)
Glucose: 63
Potassium: 4 mmol/L (ref 3.5–5.1)
Sodium: 134 mmol/L — ABNORMAL LOW (ref 135–145)

## 2020-11-11 LAB — GLUCOSE, CAPILLARY: Glucose-Capillary: 70 mg/dL (ref 70–99)

## 2020-11-11 LAB — AFP TUMOR MARKER: AFP, Serum, Tumor Marker: 20686 ng/mL — ABNORMAL HIGH (ref 0.0–8.4)

## 2020-11-11 LAB — MAGNESIUM: Magnesium: 1.8 mg/dL (ref 1.7–2.4)

## 2020-11-11 MED ORDER — SODIUM CHLORIDE 0.9 % IV SOLN
2.0000 g | Freq: Four times a day (QID) | INTRAVENOUS | Status: DC
  Administered 2020-11-11 – 2020-11-12 (×3): 2 g via INTRAVENOUS
  Filled 2020-11-11 (×6): qty 2000

## 2020-11-11 MED ORDER — OXYCODONE-ACETAMINOPHEN 5-325 MG PO TABS
2.0000 | ORAL_TABLET | ORAL | Status: DC | PRN
  Administered 2020-11-11 – 2020-11-12 (×6): 2 via ORAL
  Administered 2020-11-12: 1 via ORAL
  Administered 2020-11-13: 2 via ORAL
  Filled 2020-11-11 (×8): qty 2

## 2020-11-11 MED ORDER — LACTULOSE 10 GM/15ML PO SOLN
20.0000 g | Freq: Once | ORAL | Status: AC
  Administered 2020-11-11: 20 g via ORAL
  Filled 2020-11-11: qty 30

## 2020-11-11 NOTE — Progress Notes (Signed)
Nurse had conversation w/ patient's daughter about sharing of info with other family/friends.  Patient states that he only wants info shared with his daughter, Adron Bene.  Password added to chart.  Adron Bene is POA.

## 2020-11-11 NOTE — Progress Notes (Signed)
PROGRESS NOTE    Gerald Odonnell  GQQ:[id number] DOB: [birth year] DOA: 11/09/2020 PCP: Jonetta Osgood, NP    Brief Narrative:  Gerald Odonnell is a 65 y.o. male with medical history significant for metastatic prostate cancer to the bone followed by Dr. Grayland Ormond, with history of colon cancer, nephrolithiasis, hypertension on chronic suprapubic catheter, who presented to the emergency room with acute onset of lower abdominal pain mainly in the suprapubic area with associated nausea and dry heaves without diarrhea. Upon arriving the hospital, heart rate was 107, respiratory rate 21, abnormal urine and elevated procalcitonin level of 1.1.  He also had a lactic acid level of 4.0.  Patient diagnosed with sepsis and a UTI, was started on antibiotics with cefepime   Assessment & Plan:   Active Problems:   Sepsis ([place])   Liver lesion  #1.  Sepsis. Complicated urinary tract infection secondary to suprapubic catheter. History of a CT for colitis. Blood culture has no growth in 1 day, urine culture grew E. coli.  We will continue cefepime for now, will change to Rocephin if susceptible. Patient has a suprapubic catheter requiring exchange.  Will consult urology.  #2.  Metastatic prostate cancer.   Liver mets with portal vein thrombosis, liver failure. Malignant ascites. Abdominal distention. Patient has some abdominal distention, he has not had a bowel movement for few days.  We will give lactulose. CT scan and it showed mild ascites.  However, patient ascites appears to be rapidly accumulating.  If abdominal pain does not getting better with bowel movement, will consider paracentesis. Discussed with Dr. Burlene Arnt, recommended palliative care.  Patient prognosis is dismal.  Discussed with patient daughter, patient wished to die at home.  Most likely he will be discharged home with hospice.  #3.  Hypokalemia. Hyponatremia Repleted K.    DVT prophylaxis: Lovenox Code Status: DNR Family  Communication: Long discussion with daughter, all questions answered. Disposition Plan:    Status is: Inpatient  Remains inpatient appropriate because:IV treatments appropriate due to intensity of illness or inability to take PO and Inpatient level of care appropriate due to severity of illness  Dispo: The patient is from: Home              Anticipated d/c is to: Home              Patient currently is not medically stable to d/c.   Difficult to place patient No        I/O last 3 completed shifts: In: 5829.5 [P.O.:960; IV Piggyback:4869.5] Out: 1000 [Urine:1000] No intake/output data recorded.     Consultants:  Oncology  Procedures: None  Antimicrobials: Cefepime  Subjective: Patient complaining of abdominal distention, pain.  No nausea vomiting.  He is constipated. No fever chills. Suprapubic catheter in place. No short of breath or cough. No chest pain or palpitation.  Objective: Vitals:   11/10/20 2020 11/11/20 0515 11/11/20 0857 11/11/20 1121  BP: (!) 106/59 109/75 113/72 (!) 105/56  Pulse: 76 74 86 80  Resp: 18 17 18 18   Temp: 98.4 F (36.9 C) 98.2 F (36.8 C) 98 F (36.7 C) 98 F (36.7 C)  TempSrc: Oral Oral    SpO2: 98%  97% 98%  Weight:      Height:        Intake/Output Summary (Last 24 hours) at 11/11/2020 1256 Last data filed at 11/11/2020 0500 Gross per 24 hour  Intake 460 ml  Output 750 ml  Net -290 ml   [employer]  11/09/20 2246  Weight: 88.9 kg    Examination:  General exam: Ill-appearing, Respiratory system: Clear to auscultation. Respiratory effort normal. Cardiovascular system: S1 & S2 heard, RRR. No JVD, murmurs, rubs, gallops or clicks. No pedal edema. Gastrointestinal system: Abdomen is distended, soft and mildly tender. No organomegaly or masses felt. Normal bowel sounds heard. Central nervous system: Alert and oriented x2. No focal neurological deficits. Extremities: Symmetric 5 x 5 power. Skin: No rashes, lesions or  ulcers      Data Reviewed: I have personally reviewed following labs and imaging studies  CBC: Recent Labs  Lab 11/09/20 2217 11/10/20 0632 11/11/20 0522  WBC 9.3 6.8 6.7  NEUTROABS  --   --  5.4  HGB 12.3* 12.2* 11.5*  HCT 36.4* 35.1* 35.1*  MCV 107.7* 107.0* 108.7*  PLT 216 194 937   Basic Metabolic Panel: Recent Labs  Lab 11/09/20 2217 11/10/20 0632 11/11/20 0522  NA 133* 134* 134*  K 3.8 2.8* 4.0  CL 96* 97* 102  CO2 24 26 24   GLUCOSE 82 69* 63*  BUN 9 7* 8  CREATININE 0.89 0.63 0.61  CALCIUM 9.1 9.1 8.9  MG  --   --  1.8   GFR: Estimated Creatinine Clearance: 103.4 mL/min (by C-G formula based on SCr of 0.61 mg/dL). Liver Function Tests: Recent Labs  Lab 11/09/20 2217  AST 203*  ALT 36  ALKPHOS 177*  BILITOT 6.7*  PROT 6.3*  ALBUMIN 2.7*   Recent Labs  Lab 11/09/20 2217  LIPASE 21   No results for input(s): AMMONIA in the last 168 hours. Coagulation Profile: Recent Labs  Lab 11/10/20 0632  INR 2.0*   Cardiac Enzymes: No results for input(s): CKTOTAL, CKMB, CKMBINDEX, TROPONINI in the last 168 hours. BNP (last 3 results) No results for input(s): PROBNP in the last 8760 hours. HbA1C: No results for input(s): HGBA1C in the last 72 hours. CBG: Recent Labs  Lab 11/11/20 1123  GLUCAP 70   Lipid Profile: No results for input(s): CHOL, HDL, LDLCALC, TRIG, CHOLHDL, LDLDIRECT in the last 72 hours. Thyroid Function Tests: No results for input(s): TSH, T4TOTAL, FREET4, T3FREE, THYROIDAB in the last 72 hours. Anemia Panel: No results for input(s): VITAMINB12, FOLATE, FERRITIN, TIBC, IRON, RETICCTPCT in the last 72 hours. Sepsis Labs: Recent Labs  Lab 11/09/20 2317 11/09/20 2318 11/10/20 0132 11/10/20 0632 11/10/20 0927  PROCALCITON 1.10  --   --  1.10  --   LATICACIDVEN  --  4.0* 3.9* 2.8* 2.1*    Recent Results (from the past 240 hour(s))  Urine Culture     Status: Abnormal (Preliminary result)   Collection Time: 11/09/20 11:12 PM    Specimen: Urine, Random  Result Value Ref Range Status   Specimen Description   Final    URINE, RANDOM Performed at [facility], [address], [place], [place] [address]    Special Requests   Final    NONE Performed at [facility], [address] [place], [place] [address]    Culture (A)  Final    >=100,000 COLONIES/mL ENTEROCOCCUS FAECALIS SUSCEPTIBILITIES TO FOLLOW Performed at [facility], [place] [address], [place], [place] [address]    Report Status PENDING  Incomplete  Resp Panel by RT-PCR (Flu A&B, Covid) Nasopharyngeal Swab     Status: None   Collection Time: 11/10/20  1:47 AM   Specimen: Nasopharyngeal Swab; Nasopharyngeal(NP) swabs in vial transport medium  Result Value Ref Range Status   SARS Coronavirus 2  by RT PCR NEGATIVE NEGATIVE Final    Comment: (NOTE) SARS-CoV-2 target nucleic acids are NOT DETECTED.  The SARS-CoV-2 RNA is generally detectable in upper respiratory specimens during the acute phase of infection. The lowest concentration of SARS-CoV-2 viral copies this assay can detect is 138 copies/mL. A negative result does not preclude SARS-Cov-2 infection and should not be used as the sole basis for treatment or other patient management decisions. A negative result may occur with  improper specimen collection/handling, submission of specimen other than nasopharyngeal swab, presence of viral mutation(s) within the areas targeted by this assay, and inadequate number of viral copies(<138 copies/mL). A negative result must be combined with clinical observations, patient history, and epidemiological information. The expected result is Negative.  Fact Sheet for Patients:  EntrepreneurPulse.com.au  Fact Sheet for Healthcare Providers:  IncredibleEmployment.be  This test is no t yet approved or cleared by the [place] FDA and  has been authorized for detection and/or diagnosis of SARS-CoV-2  by FDA under an Emergency Use Authorization (EUA). This EUA will remain  in effect (meaning this test can be used) for the duration of the COVID-19 declaration under Section 564(b)(1) of the Act, 21 U.S.C.section 360bbb-3(b)(1), unless the authorization is terminated  or revoked sooner.       Influenza A by PCR NEGATIVE NEGATIVE Final   Influenza B by PCR NEGATIVE NEGATIVE Final    Comment: (NOTE) The Xpert Xpress SARS-CoV-2/FLU/RSV plus assay is intended as an aid in the diagnosis of influenza from Nasopharyngeal swab specimens and should not be used as a sole basis for treatment. Nasal washings and aspirates are unacceptable for Xpert Xpress SARS-CoV-2/FLU/RSV testing.  Fact Sheet for Patients: EntrepreneurPulse.com.au  Fact Sheet for Healthcare Providers: IncredibleEmployment.be  This test is not yet approved or cleared by the [place] FDA and has been authorized for detection and/or diagnosis of SARS-CoV-2 by FDA under an Emergency Use Authorization (EUA). This EUA will remain in effect (meaning this test can be used) for the duration of the COVID-19 declaration under Section 564(b)(1) of the Act, 21 U.S.C. section 360bbb-3(b)(1), unless the authorization is terminated or revoked.  Performed at [facility], [address] [place], [place] [address]   Blood Culture (routine x 2)     Status: None (Preliminary result)   Collection Time: 11/10/20  6:32 AM   Specimen: BLOOD  Result Value Ref Range Status   Specimen Description BLOOD BLOOD LEFT HAND  Final   Special Requests   Final    BOTTLES DRAWN AEROBIC AND ANAEROBIC Blood Culture adequate volume   Culture   Final    NO GROWTH 1 DAY Performed at [facility], [address], [place], [place] [address]    Report Status PENDING  Incomplete  Blood Culture (routine x 2)     Status: None (Preliminary result)   Collection Time: 11/10/20  6:32 AM   Specimen:  BLOOD  Result Value Ref Range Status   Specimen Description BLOOD BLOOD RIGHT HAND  Final   Special Requests   Final    BOTTLES DRAWN AEROBIC AND ANAEROBIC Blood Culture adequate volume   Culture   Final    NO GROWTH 1 DAY Performed at [facility], [address], [place],  [address]    Report Status PENDING  Incomplete         Radiology Studies: CT ABDOMEN PELVIS W CONTRAST  Result Date: 11/10/2020 CLINICAL DATA:  Lower abdominal pain, nausea, vomiting. Metastatic prostate cancer. EXAM: CT  ABDOMEN AND PELVIS WITH CONTRAST TECHNIQUE: Multidetector CT imaging of the abdomen and pelvis was performed using the standard protocol following bolus administration of intravenous contrast. CONTRAST:  158mL OMNIPAQUE IOHEXOL 350 MG/ML SOLN COMPARISON:  None. FINDINGS: Lower chest: Stable mild elevation of the left hemidiaphragm. The visualized lung bases are clear. Moderate coronary artery calcification. Hepatobiliary: The liver contour is nodular and there is hypertrophy of the a left hepatic lobe and caudate lobe in keeping with changes of underlying cirrhosis. Since the prior examination, there has developed a markedly heterogeneous enhancement pattern within the right hepatic lobe with multiple hypoattenuating intrahepatic mass is identified. While predominantly located within the right hepatic lobe, several hypoenhancing masses are also noted within the left hepatic lobe. The dominant mass is seen within segment 5 of the liver and appears to demonstrate confluent, expansile tumor thrombus within the a portal venous system extending into the a main portal vein just proximal to the portosplenic confluence. Altogether, this is most in keeping with multifocal hepatocellular carcinoma with associated portal venous extension. Exact measurement of the primary mass is difficult to delineate on this examination. An index lesion, however, within the inferior right hepatic lobe measures 3.7 x 5.0  cm at axial image # 50/2. No intra or extrahepatic biliary ductal dilation. Gallbladder unremarkable. Pancreas: Unremarkable Spleen: The spleen is enlarged, measuring 14.4 cm in greatest dimension, similar to that noted on prior examination. Adrenals/Urinary Tract: Bilateral adrenal nodules are unchanged measuring 20 mm on the right and 13 mm on the left. These are not well characterized on this examination. The kidneys are unremarkable. Multiple calculi are seen laying dependently within the bladder lumen, progressive since prior examination with the dominant calculus measuring 11 x 28 mm a suprapubic catheter seen within a decompressed bladder lumen. Stomach/Bowel: Distal colectomy has been performed with an anastomotic staple line seen within the splenic flexure. The stomach, small bowel, and large bowel are otherwise unremarkable. Appendix normal. Interval development of mild ascites. No free intraperitoneal gas. Vascular/Lymphatic: Moderate aortoiliac atherosclerotic calcification. No aortic aneurysm. Small gastroesophageal varices identified. Multiple shotty lymph nodes are again identified within the porta hepatis, stable since prior examination, nonspecific. Index lymph node measures 13 mm in short axis diameter at axial image # 40/2. No additional pathologic adenopathy within the abdomen and pelvis. Reproductive: Brachytherapy seeds noted within the prostate gland. Other: Small fat containing inguinal hernias again noted. Musculoskeletal: Numerable sclerotic metastases are again seen throughout the visualized axial skeleton in keeping with the patient's known underlying metastatic prostate cancer. No pathologic fracture identified. IMPRESSION: Interval development of innumerable hepatic hypoenhancing masses with thrombosis of the main, right, and left portal veins most in keeping with multifocal hepatocellular carcinoma with portal venous extension/tumor thrombus. Tumor thrombus extends into the proximal  portal vein just distal to the portal splenic confluence. Background changes of cirrhosis. Interval development of mild ascites likely reflects changes of portal venous hypertension. Correlation with serum AFP levels would be helpful in confirming this. Contrast enhanced MRI examination would also be helpful in further evaluating this finding. Stable splenomegaly. Stable adrenal nodules, not optimally characterized on this examination. Progressive calculi within the bladder lumen. Decompression of the bladder with suprapubic catheter in appropriate position. Grossly stable innumerable sclerotic metastases throughout the visualized axial skeleton in keeping with the patient's known metastatic prostate cancer. Aortic Atherosclerosis (ICD10-I70.0). Electronically Signed   By: Fidela Salisbury MD   On: 11/10/2020 01:04   DG Chest Port 1 View  Result Date: 11/09/2020 CLINICAL DATA:  Lower  abdominal pain EXAM: PORTABLE CHEST 1 VIEW COMPARISON:  10/12/2020, 08/17/2016 FINDINGS: Chronic elevation left diaphragm with pleural scarring. No focal opacity or pleural effusion. Stable cardiomediastinal silhouette. No pneumothorax. Sclerotic bony metastatic disease redemonstrated. IMPRESSION: No active disease.  Sclerotic bony metastatic disease. Electronically Signed   By: Donavan Foil M.D.   On: 11/09/2020 23:57        Scheduled Meds:  enoxaparin (LOVENOX) injection  40 mg Subcutaneous Q24H   oxybutynin  5 mg Oral QID   Continuous Infusions:  ceFEPime (MAXIPIME) IV 2 g (11/11/20 0811)     LOS: 1 day    Time spent: 34 minutes, more than 50% time spent on direct patient care.    Sharen Hones, MD [facility]   To contact the attending provider between 7A-7P or the covering provider during after hours 7P-7A, please log into the web site [email or web address] and access using universal [place] password for that web site. If you do not have the password, please call the hospital operator.  11/11/2020,  12:56 PM

## 2020-11-11 NOTE — Progress Notes (Signed)
Gerald Odonnell   DOB:02-11-1956   PZ#:[id number]    Subjective: Patient continues to complain of abdominal pain poor p.o. intake.  Positive for nausea no vomiting.  Overall continues to feel poorly.  Objective:  Vitals:   11/11/20 1536 11/11/20 2002  BP: 99/63 118/66  Pulse: 93 92  Resp: 17 19  Temp: 97.9 F (36.6 C) 97.8 F (36.6 C)  SpO2: 95% 97%     Intake/Output Summary (Last 24 hours) at 11/11/2020 2254 Last data filed at 11/11/2020 1852 Gross per 24 hour  Intake 420.15 ml  Output 650 ml  Net -229.85 ml    Physical Exam Vitals and nursing note reviewed.  Constitutional:      Comments: Patient resting in the bed.  Accompanied by his wife.  HENT:     Head: Normocephalic and atraumatic.     Mouth/Throat:     Mouth: Mucous membranes are moist.     Pharynx: No oropharyngeal exudate.  Eyes:     General: Scleral icterus present.     Pupils: Pupils are equal, round, and reactive to light.  Cardiovascular:     Rate and Rhythm: Normal rate and regular rhythm.  Pulmonary:     Effort: No respiratory distress.     Breath sounds: No wheezing.     Comments: Decreased breath sounds bilaterally at bases.  No wheeze or crackles Abdominal:     General: Bowel sounds are normal. There is no distension.     Palpations: Abdomen is soft. There is no mass.     Tenderness: There is no abdominal tenderness. There is no guarding or rebound.  Musculoskeletal:        General: No tenderness. Normal range of motion.     Cervical back: Normal range of motion and neck supple.  Skin:    General: Skin is warm.  Neurological:     Mental Status: He is alert and oriented to person, place, and time.  Psychiatric:        Mood and Affect: Affect normal.        Judgment: Judgment normal.    Labs:  Lab Results  Component Value Date   WBC 6.7 11/11/2020   HGB 11.5 (L) 11/11/2020   HCT 35.1 (L) 11/11/2020   MCV 108.7 (H) 11/11/2020   PLT 187 11/11/2020   NEUTROABS 5.4 11/11/2020    Lab  Results  Component Value Date   NA 134 (L) 11/11/2020   K 4.0 11/11/2020   CL 102 11/11/2020   CO2 24 11/11/2020    Studies:  CT ABDOMEN PELVIS W CONTRAST  Result Date: 11/10/2020 CLINICAL DATA:  Lower abdominal pain, nausea, vomiting. Metastatic prostate cancer. EXAM: CT ABDOMEN AND PELVIS WITH CONTRAST TECHNIQUE: Multidetector CT imaging of the abdomen and pelvis was performed using the standard protocol following bolus administration of intravenous contrast. CONTRAST:  133mL OMNIPAQUE IOHEXOL 350 MG/ML SOLN COMPARISON:  None. FINDINGS: Lower chest: Stable mild elevation of the left hemidiaphragm. The visualized lung bases are clear. Moderate coronary artery calcification. Hepatobiliary: The liver contour is nodular and there is hypertrophy of the a left hepatic lobe and caudate lobe in keeping with changes of underlying cirrhosis. Since the prior examination, there has developed a markedly heterogeneous enhancement pattern within the right hepatic lobe with multiple hypoattenuating intrahepatic mass is identified. While predominantly located within the right hepatic lobe, several hypoenhancing masses are also noted within the left hepatic lobe. The dominant mass is seen within segment 5 of the liver and appears to  demonstrate confluent, expansile tumor thrombus within the a portal venous system extending into the a main portal vein just proximal to the portosplenic confluence. Altogether, this is most in keeping with multifocal hepatocellular carcinoma with associated portal venous extension. Exact measurement of the primary mass is difficult to delineate on this examination. An index lesion, however, within the inferior right hepatic lobe measures 3.7 x 5.0 cm at axial image # 50/2. No intra or extrahepatic biliary ductal dilation. Gallbladder unremarkable. Pancreas: Unremarkable Spleen: The spleen is enlarged, measuring 14.4 cm in greatest dimension, similar to that noted on prior examination.  Adrenals/Urinary Tract: Bilateral adrenal nodules are unchanged measuring 20 mm on the right and 13 mm on the left. These are not well characterized on this examination. The kidneys are unremarkable. Multiple calculi are seen laying dependently within the bladder lumen, progressive since prior examination with the dominant calculus measuring 11 x 28 mm a suprapubic catheter seen within a decompressed bladder lumen. Stomach/Bowel: Distal colectomy has been performed with an anastomotic staple line seen within the splenic flexure. The stomach, small bowel, and large bowel are otherwise unremarkable. Appendix normal. Interval development of mild ascites. No free intraperitoneal gas. Vascular/Lymphatic: Moderate aortoiliac atherosclerotic calcification. No aortic aneurysm. Small gastroesophageal varices identified. Multiple shotty lymph nodes are again identified within the porta hepatis, stable since prior examination, nonspecific. Index lymph node measures 13 mm in short axis diameter at axial image # 40/2. No additional pathologic adenopathy within the abdomen and pelvis. Reproductive: Brachytherapy seeds noted within the prostate gland. Other: Small fat containing inguinal hernias again noted. Musculoskeletal: Numerable sclerotic metastases are again seen throughout the visualized axial skeleton in keeping with the patient's known underlying metastatic prostate cancer. No pathologic fracture identified. IMPRESSION: Interval development of innumerable hepatic hypoenhancing masses with thrombosis of the main, right, and left portal veins most in keeping with multifocal hepatocellular carcinoma with portal venous extension/tumor thrombus. Tumor thrombus extends into the proximal portal vein just distal to the portal splenic confluence. Background changes of cirrhosis. Interval development of mild ascites likely reflects changes of portal venous hypertension. Correlation with serum AFP levels would be helpful in  confirming this. Contrast enhanced MRI examination would also be helpful in further evaluating this finding. Stable splenomegaly. Stable adrenal nodules, not optimally characterized on this examination. Progressive calculi within the bladder lumen. Decompression of the bladder with suprapubic catheter in appropriate position. Grossly stable innumerable sclerotic metastases throughout the visualized axial skeleton in keeping with the patient's known metastatic prostate cancer. Aortic Atherosclerosis (ICD10-I70.0). Electronically Signed   By: Fidela Salisbury MD   On: 11/10/2020 01:04   DG Chest Port 1 View  Result Date: 11/09/2020 CLINICAL DATA:  Lower abdominal pain EXAM: PORTABLE CHEST 1 VIEW COMPARISON:  10/12/2020, 08/17/2016 FINDINGS: Chronic elevation left diaphragm with pleural scarring. No focal opacity or pleural effusion. Stable cardiomediastinal silhouette. No pneumothorax. Sclerotic bony metastatic disease redemonstrated. IMPRESSION: No active disease.  Sclerotic bony metastatic disease. Electronically Signed   By: Donavan Foil M.D.   On: 11/09/2020 23:57    Liver lesion #65 year old male patient with multiple medical problems-including CHF COPD/home O2; cirrhosis; metastatic prostate cancer-is currently admitted to hospital for worsening abdominal pain/concern for sepsis-CT scan showed multiple liver lesions concerning for malignancy  # Multiple liver lesions with tumor involving the portal vein- in the context of cirrhosis-highly concerning for malignancy especially hepatocellular carcinoma-AFP 20,000+/see below  #Cirrhosis/liver decompensation- coagulopathy elevated bilirubin/likely secondary to #1/see below  #Abdominal pain-secondary to #1.  Stable continue current pain medication.  #  Metastatic prostate cancer-on Zytiga plus prednisone-discontinued.  PSA stable at 3.5.  #Chronic respiratory failure CHF COPD  PLAN:   #I had a long discussion with the patient and wife regarding the  serious concerns for advanced liver malignancy-for which there are no good therapeutic options.  Given his elevated bilirubin/portal vein thrombosis patient not a candidate for any local therapy for the liver.  Systemic therapy options are again limited-with elevated bilirubin/liver failure.  Discussed regarding further imaging/biopsy-however given no good treatment options-I think it is reasonable to forego further imaging//biopsy as this would not change the management.  Discussed with patient/and wife regarding hospice.  They are still undecided.  Will inform Josh in the morning regarding to follow up.     Cammie Sickle, MD 11/11/2020  10:54 PM

## 2020-11-11 NOTE — Progress Notes (Signed)
PT Cancellation Note  Patient Details Name: Gerald Odonnell MRN: [id number] DOB: Jun 22, 1955   Cancelled Treatment:    Reason Eval/Treat Not Completed: Pain limiting ability to participate. Orders received and chart reviewed. Upon entry on room pt refusing PT and OT at this time due to abdominal pain. Pt states he will not participate unless his chronic suprapubic catheter is removed. Medical team notified. PT will re-attempt at a later date as time allows.   Salem Caster. Fairly IV, PT, DPT Physical Therapist- [facility]  11/11/2020, 10:16 AM

## 2020-11-11 NOTE — Progress Notes (Signed)
OT Cancellation Note  Patient Details Name: Gerald Odonnell MRN: [id number] DOB: [birth year]   Cancelled Treatment:    Reason Eval/Treat Not Completed: Patient declined, no reason specified. Per PT, pt refusing all therapy efforts today and declines all OT intervention until suprapubic cath removed. OT will re-attempt at next available time.  Darleen Crocker, MS, OTR/L , CBIS ascom [phone]  11/11/20, 10:49 AM

## 2020-11-12 ENCOUNTER — Inpatient Hospital Stay: Payer: Medicare HMO

## 2020-11-12 ENCOUNTER — Inpatient Hospital Stay: Payer: Medicare HMO | Admitting: Oncology

## 2020-11-12 DIAGNOSIS — Z515 Encounter for palliative care: Secondary | ICD-10-CM

## 2020-11-12 DIAGNOSIS — J9610 Chronic respiratory failure, unspecified whether with hypoxia or hypercapnia: Secondary | ICD-10-CM | POA: Diagnosis not present

## 2020-11-12 DIAGNOSIS — R338 Other retention of urine: Secondary | ICD-10-CM | POA: Diagnosis not present

## 2020-11-12 DIAGNOSIS — C220 Liver cell carcinoma: Secondary | ICD-10-CM | POA: Diagnosis not present

## 2020-11-12 DIAGNOSIS — K769 Liver disease, unspecified: Secondary | ICD-10-CM | POA: Diagnosis not present

## 2020-11-12 DIAGNOSIS — A419 Sepsis, unspecified organism: Secondary | ICD-10-CM | POA: Diagnosis not present

## 2020-11-12 DIAGNOSIS — R109 Unspecified abdominal pain: Secondary | ICD-10-CM | POA: Diagnosis not present

## 2020-11-12 LAB — URINE CULTURE: Culture: >=100000 — AB

## 2020-11-12 MED ORDER — MORPHINE SULFATE 2 MG/ML IJ SOLN
2.0000 mg | INTRAMUSCULAR | Status: DC | PRN
  Filled 2020-11-12: qty 2

## 2020-11-12 MED ORDER — FENTANYL 25 MCG/HR TD PT72
1.0000 | MEDICATED_PATCH | TRANSDERMAL | Status: DC
  Administered 2020-11-12: 1 via TRANSDERMAL
  Filled 2020-11-12: qty 1

## 2020-11-12 MED ORDER — OLANZAPINE 5 MG PO TBDP
5.0000 mg | ORAL_TABLET | Freq: Every day | ORAL | Status: DC
  Administered 2020-11-12: 5 mg via ORAL
  Filled 2020-11-12 (×2): qty 1

## 2020-11-12 MED ORDER — MORPHINE SULFATE (CONCENTRATE) 10 MG/0.5ML PO SOLN
5.0000 mg | ORAL | Status: DC | PRN
  Filled 2020-11-12: qty 0.5

## 2020-11-12 MED ORDER — OXYCODONE HCL ER 10 MG PO T12A: 20.0000 mg | EXTENDED_RELEASE_TABLET | Freq: Two times a day (BID) | ORAL | Status: DC

## 2020-11-12 MED ORDER — AMOXICILLIN 500 MG PO CAPS
500.0000 mg | ORAL_CAPSULE | Freq: Three times a day (TID) | ORAL | Status: DC
  Administered 2020-11-13 (×2): 500 mg via ORAL
  Filled 2020-11-12 (×6): qty 1

## 2020-11-12 MED ORDER — DEXAMETHASONE SODIUM PHOSPHATE 4 MG/ML IJ SOLN
4.0000 mg | INTRAMUSCULAR | Status: DC
  Administered 2020-11-12 – 2020-11-13 (×2): 4 mg via INTRAVENOUS
  Filled 2020-11-12 (×2): qty 1

## 2020-11-12 NOTE — Progress Notes (Addendum)
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  Met with Sovereign, wife Baker Janus and his sister at the bedside. Cailan confirms that he cannot return home and would like to go to [facility] for EOL care.   Najeeb and family are aware that bed offer is pending approval from [facility]. Once this has been determined, [facility] will update patient, family and hospital staff.  Thank you for this referral, Venia Carbon RN, BSN, [facility] Liaison   **addendum 430p, pt is eligible for residential hospice at [facility]. [facility] will update family and hospital once bed is available.

## 2020-11-12 NOTE — Progress Notes (Signed)
PT Cancellation Note  Patient Details Name: Gerald Odonnell MRN: [id number] DOB: July 30, 1955   Cancelled Treatment:    Reason Eval/Treat Not Completed: Pain limiting ability to participate Pt chart reviewed. Upon arrival, pt states he "is dying", in pain and would not like to participate with PT. Following this encounter, PT was notified that PT can sign off as pt is transitioning to hospice per Dr. Roosevelt Locks. PT will no longer follow pt.   Patrina Levering PT, DPT 11/12/20 12:48 PM [phone]   [name] 11/12/2020, 12:45 PM

## 2020-11-12 NOTE — Progress Notes (Signed)
[place]  [phone] [phone]  ID: Elford K Martinique OB: 1955-08-16  MR#: [id number]  AOZ#:[id number]  Patient Care Team: Jonetta Osgood, NP as PCP - General (Nurse Practitioner) Lloyd Huger, MD as Consulting Physician (Oncology)  CHIEF COMPLAINT: Hepatocellular carcinoma, stage IV prostate cancer.  INTERVAL HISTORY: Patient recently admitted to the hospital and found to have decompensated liver failure secondary to new onset rapidly progressive hepatocellular carcinoma with AFP greater than 20,000.  Patient has elected for hospice care and is being transferred to the hospice home possibly in the morning.  REVIEW OF SYSTEMS:   Review of Systems  Constitutional:  Positive for malaise/fatigue. Negative for fever and weight loss.  Respiratory: Negative.  Negative for cough and shortness of breath.   Cardiovascular: Negative.  Negative for chest pain and leg swelling.  Gastrointestinal:  Positive for abdominal pain and nausea.  Genitourinary:  Negative for dysuria.  Musculoskeletal: Negative.  Negative for back pain.  Skin: Negative.  Negative for itching and rash.  Neurological:  Positive for weakness. Negative for dizziness, focal weakness and headaches.  Psychiatric/Behavioral: Negative.  The patient is not nervous/anxious.    As per HPI. Otherwise, a complete review of systems is negative.  PAST MEDICAL HISTORY: Past Medical History:  Diagnosis Date   Cancer ([place])    colon   Cancer ([place])    bone cancer   Colon cancer ([place])    Hx of   History of kidney stones    Hypertension    Prostate cancer ([place])    Prostate cancer ([place])    Hx of    S/P colon resection    Shortness of breath dyspnea    Suprapubic catheter ([place])    Urinary retention     PAST SURGICAL HISTORY: Past Surgical History:  Procedure Laterality Date   ABDOMINAL SURGERY     COLON SURGERY     CYSTOSCOPY WITH LITHOLAPAXY N/A 06/09/2017   Procedure:  CYSTOSCOPY WITH LITHOLAPAXY;  Surgeon: Hollice Espy, MD;  Location: [facility] ORS;  Service: Urology;  Laterality: N/A;   PROSTATE SURGERY  05/25/2016   SUPRAPUBIC CATHETER INSERTION      FAMILY HISTORY: Family History  Problem Relation Age of Onset   Hypertension Other    Alcohol abuse Mother    Heart attack Father    Prostate cancer Brother    Breast cancer Sister     ADVANCED DIRECTIVES (Y/N): DNR/DNI  HEALTH MAINTENANCE: Social History   Tobacco Use   Smoking status: Every Day    Packs/day: 0.25    Years: 40.00    Pack years: 10.00    Types: Cigarettes   Smokeless tobacco: Never   Tobacco comments:    1pack a week  Vaping Use   Vaping Use: Never used  Substance Use Topics   Alcohol use: Yes   Drug use: No     Colonoscopy:  PAP:  Bone density:  Lipid panel:  No Known Allergies  Current Facility-Administered Medications  Medication Dose Route Frequency Provider Last Rate Last Admin   acetaminophen (TYLENOL) tablet 650 mg  650 mg Oral Q6H PRN Mansy, Jan A, MD   650 mg at 11/11/20 0003   Or   acetaminophen (TYLENOL) suppository 650 mg  650 mg Rectal Q6H PRN Mansy, Jan A, MD       amoxicillin (AMOXIL) capsule 500 mg  500 mg Oral Q8H Zhang, Dekui, MD       dexamethasone (DECADRON) injection 4 mg  4 mg Intravenous Q24H  Borders, Kirt Boys, NP   4 mg at 11/12/20 1433   enoxaparin (LOVENOX) injection 40 mg  40 mg Subcutaneous Q24H Mansy, Jan A, MD   40 mg at 11/12/20 3762   fentaNYL (DURAGESIC) 25 MCG/HR 1 patch  1 patch Transdermal Q72H Borders, Kirt Boys, NP   1 patch at 11/12/20 1434   magnesium hydroxide (MILK OF MAGNESIA) suspension 30 mL  30 mL Oral Daily PRN Mansy, Jan A, MD   30 mL at 11/11/20 1605   morphine 2 MG/ML injection 2-4 mg  2-4 mg Intravenous Q2H PRN Borders, Kirt Boys, NP       morphine CONCENTRATE 10 MG/0.5ML oral solution 5-10 mg  5-10 mg Oral Q2H PRN Borders, Kirt Boys, NP       OLANZapine zydis (ZYPREXA) disintegrating tablet 5 mg  5 mg Oral Daily  Borders, Joshua R, NP   5 mg at 11/12/20 1433   ondansetron (ZOFRAN) tablet 4 mg  4 mg Oral Q6H PRN Mansy, Jan A, MD   4 mg at 11/10/20 2243   Or   ondansetron (ZOFRAN) injection 4 mg  4 mg Intravenous Q6H PRN Mansy, Jan A, MD   4 mg at 11/12/20 1055   oxybutynin (DITROPAN) tablet 5 mg  5 mg Oral QID Mansy, Jan A, MD   5 mg at 11/12/20 1437   oxyCODONE-acetaminophen (PERCOCET/ROXICET) 5-325 MG per tablet 2 tablet  2 tablet Oral Q4H PRN Sharen Hones, MD   1 tablet at 11/12/20 1232   traZODone (DESYREL) tablet 25 mg  25 mg Oral QHS PRN Mansy, Jan A, MD        OBJECTIVE: Vitals:   11/12/20 1105 11/12/20 1506  BP: (!) 108/56 (!) 103/58  Pulse: 95 (!) 110  Resp: 18 20  Temp: 97.7 F (36.5 C) 98 F (36.7 C)  SpO2: 95% 97%     Body mass index is 28.12 kg/m.    ECOG FS:4 - Bedbound  General: Ill-appearing, mild distress secondary to abdominal pain. Eyes: Pink conjunctiva, icteric sclera. HEENT: Normocephalic, moist mucous membranes. Lungs: No audible wheezing or coughing. Heart: Regular rate and rhythm. Abdomen: Mildly distended, mild tenderness. Musculoskeletal: No edema, cyanosis, or clubbing. Neuro: Alert, answering all questions appropriately. Cranial nerves grossly intact. Skin: No rashes or petechiae noted. Psych: Normal affect.  LAB RESULTS:  Lab Results  Component Value Date   NA 134 (L) 11/11/2020   K 4.0 11/11/2020   CL 102 11/11/2020   CO2 24 11/11/2020   GLUCOSE 63 (L) 11/11/2020   BUN 8 11/11/2020   CREATININE 0.61 11/11/2020   CALCIUM 8.9 11/11/2020   PROT 6.3 (L) 11/09/2020   ALBUMIN 2.7 (L) 11/09/2020   AST 203 (H) 11/09/2020   ALT 36 11/09/2020   ALKPHOS 177 (H) 11/09/2020   BILITOT 6.7 (H) 11/09/2020   GFRNONAA >60 11/11/2020   GFRAA >60 01/19/2020    Lab Results  Component Value Date   WBC 6.7 11/11/2020   NEUTROABS 5.4 11/11/2020   HGB 11.5 (L) 11/11/2020   HCT 35.1 (L) 11/11/2020   MCV 108.7 (H) 11/11/2020   PLT 187 11/11/2020      STUDIES: CT ABDOMEN PELVIS W CONTRAST  Result Date: 11/10/2020 CLINICAL DATA:  Lower abdominal pain, nausea, vomiting. Metastatic prostate cancer. EXAM: CT ABDOMEN AND PELVIS WITH CONTRAST TECHNIQUE: Multidetector CT imaging of the abdomen and pelvis was performed using the standard protocol following bolus administration of intravenous contrast. CONTRAST:  115mL OMNIPAQUE IOHEXOL 350 MG/ML SOLN COMPARISON:  None. FINDINGS:  Lower chest: Stable mild elevation of the left hemidiaphragm. The visualized lung bases are clear. Moderate coronary artery calcification. Hepatobiliary: The liver contour is nodular and there is hypertrophy of the a left hepatic lobe and caudate lobe in keeping with changes of underlying cirrhosis. Since the prior examination, there has developed a markedly heterogeneous enhancement pattern within the right hepatic lobe with multiple hypoattenuating intrahepatic mass is identified. While predominantly located within the right hepatic lobe, several hypoenhancing masses are also noted within the left hepatic lobe. The dominant mass is seen within segment 5 of the liver and appears to demonstrate confluent, expansile tumor thrombus within the a portal venous system extending into the a main portal vein just proximal to the portosplenic confluence. Altogether, this is most in keeping with multifocal hepatocellular carcinoma with associated portal venous extension. Exact measurement of the primary mass is difficult to delineate on this examination. An index lesion, however, within the inferior right hepatic lobe measures 3.7 x 5.0 cm at axial image # 50/2. No intra or extrahepatic biliary ductal dilation. Gallbladder unremarkable. Pancreas: Unremarkable Spleen: The spleen is enlarged, measuring 14.4 cm in greatest dimension, similar to that noted on prior examination. Adrenals/Urinary Tract: Bilateral adrenal nodules are unchanged measuring 20 mm on the right and 13 mm on the left. These  are not well characterized on this examination. The kidneys are unremarkable. Multiple calculi are seen laying dependently within the bladder lumen, progressive since prior examination with the dominant calculus measuring 11 x 28 mm a suprapubic catheter seen within a decompressed bladder lumen. Stomach/Bowel: Distal colectomy has been performed with an anastomotic staple line seen within the splenic flexure. The stomach, small bowel, and large bowel are otherwise unremarkable. Appendix normal. Interval development of mild ascites. No free intraperitoneal gas. Vascular/Lymphatic: Moderate aortoiliac atherosclerotic calcification. No aortic aneurysm. Small gastroesophageal varices identified. Multiple shotty lymph nodes are again identified within the porta hepatis, stable since prior examination, nonspecific. Index lymph node measures 13 mm in short axis diameter at axial image # 40/2. No additional pathologic adenopathy within the abdomen and pelvis. Reproductive: Brachytherapy seeds noted within the prostate gland. Other: Small fat containing inguinal hernias again noted. Musculoskeletal: Numerable sclerotic metastases are again seen throughout the visualized axial skeleton in keeping with the patient's known underlying metastatic prostate cancer. No pathologic fracture identified. IMPRESSION: Interval development of innumerable hepatic hypoenhancing masses with thrombosis of the main, right, and left portal veins most in keeping with multifocal hepatocellular carcinoma with portal venous extension/tumor thrombus. Tumor thrombus extends into the proximal portal vein just distal to the portal splenic confluence. Background changes of cirrhosis. Interval development of mild ascites likely reflects changes of portal venous hypertension. Correlation with serum AFP levels would be helpful in confirming this. Contrast enhanced MRI examination would also be helpful in further evaluating this finding. Stable splenomegaly.  Stable adrenal nodules, not optimally characterized on this examination. Progressive calculi within the bladder lumen. Decompression of the bladder with suprapubic catheter in appropriate position. Grossly stable innumerable sclerotic metastases throughout the visualized axial skeleton in keeping with the patient's known metastatic prostate cancer. Aortic Atherosclerosis (ICD10-I70.0). Electronically Signed   By: Fidela Salisbury MD   On: 11/10/2020 01:04   DG Chest Port 1 View  Result Date: 11/09/2020 CLINICAL DATA:  Lower abdominal pain EXAM: PORTABLE CHEST 1 VIEW COMPARISON:  10/12/2020, 08/17/2016 FINDINGS: Chronic elevation left diaphragm with pleural scarring. No focal opacity or pleural effusion. Stable cardiomediastinal silhouette. No pneumothorax. Sclerotic bony metastatic disease redemonstrated. IMPRESSION: No active  disease.  Sclerotic bony metastatic disease. Electronically Signed   By: Donavan Foil M.D.   On: 11/09/2020 23:57    ASSESSMENT: Hepatocellular carcinoma, stage IV prostate cancer.  PLAN:    1.  Hepatocellular carcinoma: Imaging results reviewed independently.  Of note patient had negative CT of abdomen and pelvis in January 2022.  AFP is greater than 20,000 which is pathognomonic for hepatocellular carcinoma.  Given his decompensated liver, treatment is not possible and patient has elected for hospice care.  Plan is to transfer to hospice home tomorrow morning if bed available. 2.  Stage IV prostate cancer: Patient previously on Zytiga with good control of his disease.  Okay to discontinue treatment given transfer to hospice house.  No further interventions are needed. 3.  Decompensated liver: Secondary to rapidly onset hepatocellular carcinoma.  No further labs are necessary. 4.  Pain: Continue current pain medication.  Appreciate palliative care input. 5.  Nausea: Patient states he has difficulty swallowing pills.  Palliative care is ordered at discharge ODT. 6.  Disposition:  Transfer to hospice home when bed available, possibly in the a.m.  No follow-up is necessary at the cancer center.  DNR/DNI.   Lloyd Huger, MD   11/12/2020 6:26 PM

## 2020-11-12 NOTE — Consult Note (Signed)
[place]  [phone] [phone]   Name: Gerald Odonnell Date: 11/12/2020 MRN: [id number]  DOB: [birth year]  Patient Care Team: Jonetta Osgood, NP as PCP - General (Nurse Practitioner) Lloyd Huger, MD as Consulting Physician (Oncology)    REASON FOR CONSULTATION: Gerald Odonnell is a 65 y.o. 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  CT revealed innumerable hepatic masses with thrombus of right main left portal veins most consistent with multifocal hepatocellular carcinoma.  He was found to have decompensated liver failure in setting of cirrhosis/cancer. Unfortunately, hospitalization has been complicated by persistent abdominal pain and nausea.  Palliative care was consulted to address goals and manage ongoing symptoms.  SOCIAL HISTORY:     reports that he has been smoking cigarettes. He has a 10.00 pack-year smoking history. He has never used smokeless tobacco. He reports current alcohol use. He reports that he does not use drugs.  ADVANCE DIRECTIVES:  None on file  CODE STATUS: DNR  PAST MEDICAL HISTORY: Past Medical History:  Diagnosis Date   Cancer ([place])    colon   Cancer ([place])    bone cancer   Colon cancer ([place])    Hx of   History of kidney stones    Hypertension    Prostate cancer ([place])    Prostate cancer ([place])    Hx of    S/P colon resection    Shortness of breath dyspnea    Suprapubic catheter ([place])    Urinary retention     PAST SURGICAL HISTORY:  Past Surgical History:  Procedure Laterality Date   ABDOMINAL SURGERY     COLON SURGERY     CYSTOSCOPY WITH LITHOLAPAXY N/A 06/09/2017   Procedure: CYSTOSCOPY WITH LITHOLAPAXY;  Surgeon: Hollice Espy, MD;  Location: [facility] ORS;  Service: Urology;  Laterality: N/A;   PROSTATE SURGERY   05/25/2016   SUPRAPUBIC CATHETER INSERTION      HEMATOLOGY/ONCOLOGY HISTORY:  Oncology History  Prostate cancer ([place])  03/15/2017 Initial Diagnosis   Prostate cancer ([place])    03/04/2018 Cancer Staging   Staging form: Prostate, AJCC 8th Edition - Clinical stage from 03/04/2018: Stage IVB (cT3a, cN0, cM1b, Grade Group: 5) - Signed by Lloyd Huger, MD on 03/04/2018      ALLERGIES:  has No Known Allergies.  MEDICATIONS:  Current Facility-Administered Medications  Medication Dose Route Frequency Provider Last Rate Last Admin   acetaminophen (TYLENOL) tablet 650 mg  650 mg Oral Q6H PRN Mansy, Jan A, MD   650 mg at 11/11/20 0003   Or   acetaminophen (TYLENOL) suppository 650 mg  650 mg Rectal Q6H PRN Mansy, Jan A, MD       amoxicillin (AMOXIL) capsule 500 mg  500 mg Oral Q8H Zhang, Danford Bad, MD       dexamethasone (DECADRON) injection 4 mg  4 mg Intravenous Q24H Nyx Keady, Kirt Boys, NP       enoxaparin (LOVENOX) injection 40 mg  40 mg Subcutaneous Q24H Mansy, Jan A, MD   40 mg at 11/12/20 7793   fentaNYL (DURAGESIC) 25 MCG/HR 1 patch  1 patch Transdermal Q72H Khamari Yousuf, Kirt Boys, NP       magnesium hydroxide (MILK OF MAGNESIA) suspension 30 mL  30 mL Oral Daily PRN Mansy, Jan A, MD   30 mL at 11/11/20 1605   morphine 2 MG/ML  injection 2-4 mg  2-4 mg Intravenous Q2H PRN Luverne Farone, Kirt Boys, NP       morphine CONCENTRATE 10 MG/0.5ML oral solution 5-10 mg  5-10 mg Oral Q2H PRN Samyak Sackmann, Kirt Boys, NP       OLANZapine zydis (ZYPREXA) disintegrating tablet 5 mg  5 mg Oral Daily Merridith Dershem, Kirt Boys, NP       ondansetron [facility]) tablet 4 mg  4 mg Oral Q6H PRN Mansy, Jan A, MD   4 mg at 11/10/20 2243   Or   ondansetron (ZOFRAN) injection 4 mg  4 mg Intravenous Q6H PRN Mansy, Jan A, MD   4 mg at 11/12/20 1055   oxybutynin (DITROPAN) tablet 5 mg  5 mg Oral QID Mansy, Jan A, MD   5 mg at 11/12/20 2482   oxyCODONE-acetaminophen (PERCOCET/ROXICET) 5-325 MG per tablet 2 tablet  2 tablet Oral Q4H PRN Sharen Hones, MD   2 tablet at 11/12/20 1232   traZODone (DESYREL) tablet 25 mg  25 mg Oral QHS PRN Mansy, Jan A, MD        VITAL SIGNS: BP (!) 108/56   Pulse 95   Temp 97.7 F (36.5 C)   Resp 18   Ht _0  (1.778 m)   Wt 196 lb (88.9 kg)   SpO2 95%   BMI 28.12 kg/m  Filed Weights   11/09/20 2246  Weight: 196 lb (88.9 kg)    Estimated body mass index is 28.12 kg/m as calculated from the following:   Height as of this encounter: _1  (1.778 m).   Weight as of this encounter: 196 lb (88.9 kg).  LABS: CBC:    Component Value Date/Time   WBC 6.7 11/11/2020 0522   HGB 11.5 (L) 11/11/2020 0522   HGB 14.2 05/18/2013 0813   HCT 35.1 (L) 11/11/2020 0522   HCT 41.8 05/18/2013 0813   PLT 187 11/11/2020 0522   PLT 139 (L) 05/18/2013 0813   MCV 108.7 (H) 11/11/2020 0522   MCV 107 (H) 05/18/2013 0813   NEUTROABS 5.4 11/11/2020 0522   NEUTROABS 3.6 05/18/2013 0813   LYMPHSABS 0.6 (L) 11/11/2020 0522   LYMPHSABS 1.5 05/18/2013 0813   MONOABS 0.7 11/11/2020 0522   MONOABS 0.5 05/18/2013 0813   EOSABS 0.0 11/11/2020 0522   EOSABS 0.2 05/18/2013 0813   BASOSABS 0.0 11/11/2020 0522   BASOSABS 0.1 05/18/2013 0813   Comprehensive Metabolic Panel:    Component Value Date/Time   NA 134 (L) 11/11/2020 0522   NA 139 11/01/2013 1310   K 4.0 11/11/2020 0522   K 4.0 11/01/2013 1310   CL 102 11/11/2020 0522   CL 103 11/01/2013 1310   CO2 24 11/11/2020 0522   CO2 29 11/01/2013 1310   BUN 8 11/11/2020 0522   BUN 11 11/01/2013 1310   CREATININE 0.61 11/11/2020 0522   CREATININE 0.85 11/01/2013 1310   GLUCOSE 63 (L) 11/11/2020 0522   GLUCOSE 76 11/01/2013 1310   CALCIUM 8.9 11/11/2020 0522   CALCIUM 9.6 11/01/2013 1310   AST 203 (H) 11/09/2020 2217   AST 24 11/01/2013 1310   ALT 36 11/09/2020 2217   ALT 24 11/01/2013 1310   ALKPHOS 177 (H) 11/09/2020 2217   ALKPHOS 84 11/01/2013 1310   BILITOT 6.7 (H) 11/09/2020 2217   BILITOT 0.6 11/01/2013 1310   PROT 6.3 (L) 11/09/2020 2217    PROT 7.5 11/01/2013 1310   ALBUMIN 2.7 (L) 11/09/2020 2217   ALBUMIN 3.9 11/01/2013 1310  RADIOGRAPHIC STUDIES: CT ABDOMEN PELVIS W CONTRAST  Result Date: 11/10/2020 CLINICAL DATA:  Lower abdominal pain, nausea, vomiting. Metastatic prostate cancer. EXAM: CT ABDOMEN AND PELVIS WITH CONTRAST TECHNIQUE: Multidetector CT imaging of the abdomen and pelvis was performed using the standard protocol following bolus administration of intravenous contrast. CONTRAST:  172mL OMNIPAQUE IOHEXOL 350 MG/ML SOLN COMPARISON:  None. FINDINGS: Lower chest: Stable mild elevation of the left hemidiaphragm. The visualized lung bases are clear. Moderate coronary artery calcification. Hepatobiliary: The liver contour is nodular and there is hypertrophy of the a left hepatic lobe and caudate lobe in keeping with changes of underlying cirrhosis. Since the prior examination, there has developed a markedly heterogeneous enhancement pattern within the right hepatic lobe with multiple hypoattenuating intrahepatic mass is identified. While predominantly located within the right hepatic lobe, several hypoenhancing masses are also noted within the left hepatic lobe. The dominant mass is seen within segment 5 of the liver and appears to demonstrate confluent, expansile tumor thrombus within the a portal venous system extending into the a main portal vein just proximal to the portosplenic confluence. Altogether, this is most in keeping with multifocal hepatocellular carcinoma with associated portal venous extension. Exact measurement of the primary mass is difficult to delineate on this examination. An index lesion, however, within the inferior right hepatic lobe measures 3.7 x 5.0 cm at axial image # 50/2. No intra or extrahepatic biliary ductal dilation. Gallbladder unremarkable. Pancreas: Unremarkable Spleen: The spleen is enlarged, measuring 14.4 cm in greatest dimension, similar to that noted on prior examination. Adrenals/Urinary  Tract: Bilateral adrenal nodules are unchanged measuring 20 mm on the right and 13 mm on the left. These are not well characterized on this examination. The kidneys are unremarkable. Multiple calculi are seen laying dependently within the bladder lumen, progressive since prior examination with the dominant calculus measuring 11 x 28 mm a suprapubic catheter seen within a decompressed bladder lumen. Stomach/Bowel: Distal colectomy has been performed with an anastomotic staple line seen within the splenic flexure. The stomach, small bowel, and large bowel are otherwise unremarkable. Appendix normal. Interval development of mild ascites. No free intraperitoneal gas. Vascular/Lymphatic: Moderate aortoiliac atherosclerotic calcification. No aortic aneurysm. Small gastroesophageal varices identified. Multiple shotty lymph nodes are again identified within the porta hepatis, stable since prior examination, nonspecific. Index lymph node measures 13 mm in short axis diameter at axial image # 40/2. No additional pathologic adenopathy within the abdomen and pelvis. Reproductive: Brachytherapy seeds noted within the prostate gland. Other: Small fat containing inguinal hernias again noted. Musculoskeletal: Numerable sclerotic metastases are again seen throughout the visualized axial skeleton in keeping with the patient's known underlying metastatic prostate cancer. No pathologic fracture identified. IMPRESSION: Interval development of innumerable hepatic hypoenhancing masses with thrombosis of the main, right, and left portal veins most in keeping with multifocal hepatocellular carcinoma with portal venous extension/tumor thrombus. Tumor thrombus extends into the proximal portal vein just distal to the portal splenic confluence. Background changes of cirrhosis. Interval development of mild ascites likely reflects changes of portal venous hypertension. Correlation with serum AFP levels would be helpful in confirming this.  Contrast enhanced MRI examination would also be helpful in further evaluating this finding. Stable splenomegaly. Stable adrenal nodules, not optimally characterized on this examination. Progressive calculi within the bladder lumen. Decompression of the bladder with suprapubic catheter in appropriate position. Grossly stable innumerable sclerotic metastases throughout the visualized axial skeleton in keeping with the patient's known metastatic prostate cancer. Aortic Atherosclerosis (ICD10-I70.0). Electronically Signed   By:  Fidela Salisbury MD   On: 11/10/2020 01:04   DG Chest Port 1 View  Result Date: 11/09/2020 CLINICAL DATA:  Lower abdominal pain EXAM: PORTABLE CHEST 1 VIEW COMPARISON:  10/12/2020, 08/17/2016 FINDINGS: Chronic elevation left diaphragm with pleural scarring. No focal opacity or pleural effusion. Stable cardiomediastinal silhouette. No pneumothorax. Sclerotic bony metastatic disease redemonstrated. IMPRESSION: No active disease.  Sclerotic bony metastatic disease. Electronically Signed   By: Donavan Foil M.D.   On: 11/09/2020 23:57    PERFORMANCE STATUS (ECOG) : 3 - Symptomatic, >50% confined to bed  Review of Systems Unless otherwise noted, a complete review of systems is negative.  Physical Exam General: Frail-appearing Cardiovascular: regular rate and rhythm Pulmonary: clear ant fields Abdomen: Protuberant GU: no suprapubic tenderness Extremities: + edema, no joint deformities Skin: no rashes Neurological: Weakness but otherwise nonfocal  IMPRESSION: I met with patient, sister, and wife.  Patient has reportedly had some transient confusion but was fully oriented at the time of my visit and able to participate in a conversation regarding his goals.  Patient stated "I am not afraid to die and I am ready to go."    Both patient and family have met with hospitalist and medical oncology.  Patient is not felt to be a candidate for cancer treatment and hospice has been  recommended.  Patient/family verbalized agreement with hospice and have requested consideration for a residential hospice facility.  I do think hospice IPU would be clinically appropriate given his decompensated cirrhosis and significant symptom burden.    Patient states that his goal is just to be kept comfortable.  I explained comfort care in detail and he verbalized agreement.  Symptomatically, he has ongoing abdominal pain and nausea.  He has been receiving frequent dosing of Percocet (oxycodone 70 mg in past 24 hours) without significant improvement in pain.  He received a dose of IV Zofran earlier today without significant improvement in nausea.  Patient says that he is now having difficulty swallowing pills.  I suspect that there is a component of capsular pain given his hepatic tumor infiltration.  We will start IV dexamethasone, which hopefully will help improve pain and nausea.  Earlier today patient was started on OxyContin but is having difficulty swallowing pills.  Will rotate to transdermal fentanyl.  Will liberalize morphine for comfort.  We will start low-dose olanzapine ODT for nausea.  Patient/family verbalized an understanding that medications may prove sedating in context of advanced cirrhosis.  Again, they confirmed that their goal is to focus on comfort until his end-of-life.  I spoke with the hospice liaison who will also meet with patient/family to coordinate discharge.   PLAN: -Comfort care -DNR/DNI -Dispo: Hospice IPU -Start dexamethasone 4 mg IV daily -Restart morphine IV/p.o. as needed for pain -Start transdermal fentanyl 25 mcg every 72 hours -Start olanzapine 5 mg ODT daily for nausea  Case and plan discussed with Drs. Jonne Ply, and Josephine.  Also discussed with nursing   Time Total: 60 minutes  Visit consisted of counseling and education dealing with the complex and emotionally intense issues of symptom management and palliative care in the  setting of serious and potentially life-threatening illness.Greater than 50%  of this time was spent counseling and coordinating care related to the above assessment and plan.  Signed by: Altha Harm, PhD, NP-C

## 2020-11-12 NOTE — Progress Notes (Signed)
OT Cancellation Note  Patient Details Name: Gerald Odonnell MRN: [id number] DOB: 02-Nov-1955   Cancelled Treatment:    Reason Eval/Treat Not Completed: Other (comment). Per team update at rounds this date - pt wishing to pursue hospice care. Will sign off at this time. Please re-consult if new needs arise. Thank you.   Dessie Coma, M.S. OTR/L  11/12/20, 11:22 AM  ascom [phone]

## 2020-11-12 NOTE — Progress Notes (Addendum)
PROGRESS NOTE    Gerald Odonnell  WIO:[id number] DOB: 1955/07/26 DOA: 11/09/2020 PCP: Jonetta Osgood, NP    Brief Narrative:  Gerald Odonnell is a 65 y.o. male with medical history significant for metastatic prostate cancer to the bone followed by Dr. Grayland Ormond, with history of colon cancer, nephrolithiasis, hypertension on chronic suprapubic catheter, who presented to the emergency room with acute onset of lower abdominal pain mainly in the suprapubic area with associated nausea and dry heaves without diarrhea. Upon arriving the hospital, heart rate was 107, respiratory rate 21, abnormal urine and elevated procalcitonin level of 1.1.  He also had a lactic acid level of 4.0.  Patient diagnosed with sepsis and a UTI, was started on antibiotics with cefepime 7/19.  Patient has a widespread metastasis of prostate cancer including liver and spleen.  Palliative care is seeing patient, most likely discharge home tomorrow with hospice care.  Antibiotics switched to amoxicillin, urine culture come back with Enterococcus.   Assessment & Plan:   Active Problems:   Prostate cancer ([place])   Sepsis ([place])   Complicated UTI (urinary tract infection)   Liver lesion   #1.  Sepsis ruled out.  Complicated Enterococcus urinary tract infection secondary to suprapubic catheter. History of C diff colitis Patient urine culture came back with Enterococcus faecalis, blood culture negative for 2 days.  Antibiotic switched to amoxicillin. Urology is contacted to exchange suprapubic catheter. On further review the chart, sepsis diagnosis cannot be supported.  #2.  Metastatic prostate cancer.   Liver mets with portal vein thrombosis.  acute liver failure due to cancer metastasis. Malignant ascites. Abdominal distention. Has been seen by oncology, deemed poor prognosis.  No additional treatment option available.  Palliative care is seeing patient, patient preferred to go home with hospice. Discussed with social  worker and the patient and family, most likely will discharge home tomorrow after seen by palliative care today.  Please we will also arrange for hospital bed before discharge. Patient currently has a severe abdominal pain from cancer metastasis, currently on as needed Percocet, I will also add scheduled OxyContin.  #3.  Hypokalemia. Hyponatremia Repeated K.  Oncology had documented CHF, I reviewed the chart, did not see any history of CHF, no current acute CHF.  CHF is ruled out.  DVT prophylaxis: Lovenox Code Status: DNR Family Communication: Updated patient and sister. Disposition Plan:    Status is: Inpatient  Remains inpatient appropriate because:Ongoing active pain requiring inpatient pain management and Inpatient level of care appropriate due to severity of illness  Dispo: The patient is from: Home              Anticipated d/c is to: Home              Patient currently is not medically stable to d/c.   Difficult to place patient No        I/O last 3 completed shifts: In: 880.2 [P.O.:480; IV Piggyback:400.2] Out: 1250 [Urine:1250] No intake/output data recorded.     Consultants:  Oncology  Procedures: None  Antimicrobials:  Amoxicillin  Subjective: Patient complaining of severe abdominal pain, he was nauseous, no vomiting.  He had multiple looses after giving stool softener. Denies any short of breath or cough. No fever chills Suprapubic catheter in place.  Objective: Vitals:   11/12/20 0008 11/12/20 0420 11/12/20 0724 11/12/20 1105  BP: 119/76 129/81 117/63 (!) 108/56  Pulse: 86 95 93 95  Resp: 20 17 20 18   Temp: 98 F (36.7 C)  97.8 F (36.6 C) (!) 97.5 F (36.4 C) 97.7 F (36.5 C)  TempSrc: Oral Oral    SpO2:   96% 95%  Weight:      Height:        Intake/Output Summary (Last 24 hours) at 11/12/2020 1214 Last data filed at 11/12/2020 0415 Gross per 24 hour  Intake 420.15 ml  Output 500 ml  Net -79.85 ml   Filed Weights   11/09/20 2246   Weight: 88.9 kg    Examination:  General exam: Ill-appearing Respiratory system: Clear to auscultation. Respiratory effort normal. Cardiovascular system: S1 & S2 heard, RRR. No JVD, murmurs, rubs, gallops or clicks. No pedal edema. Gastrointestinal system: Abdomen is nondistended, soft and nontender. No organomegaly or masses felt. Normal bowel sounds heard. Central nervous system: Alert and oriented x3. No focal neurological deficits. Extremities: Symmetric 5 x 5 power. Skin: No rashes, lesions or ulcers Psychiatry: Mood & affect appropriate.     Data Reviewed: I have personally reviewed following labs and imaging studies  CBC: Recent Labs  Lab 11/09/20 2217 11/10/20 0632 11/11/20 0522  WBC 9.3 6.8 6.7  NEUTROABS  --   --  5.4  HGB 12.3* 12.2* 11.5*  HCT 36.4* 35.1* 35.1*  MCV 107.7* 107.0* 108.7*  PLT 216 194 244   Basic Metabolic Panel: Recent Labs  Lab 11/09/20 2217 11/10/20 0632 11/11/20 0522  NA 133* 134* 134*  K 3.8 2.8* 4.0  CL 96* 97* 102  CO2 24 26 24   GLUCOSE 82 69* 63*  BUN 9 7* 8  CREATININE 0.89 0.63 0.61  CALCIUM 9.1 9.1 8.9  MG  --   --  1.8   GFR: Estimated Creatinine Clearance: 103.4 mL/min (by C-G formula based on SCr of 0.61 mg/dL). Liver Function Tests: Recent Labs  Lab 11/09/20 2217  AST 203*  ALT 36  ALKPHOS 177*  BILITOT 6.7*  PROT 6.3*  ALBUMIN 2.7*   Recent Labs  Lab 11/09/20 2217  LIPASE 21   No results for input(s): AMMONIA in the last 168 hours. Coagulation Profile: Recent Labs  Lab 11/10/20 0632  INR 2.0*   Cardiac Enzymes: No results for input(s): CKTOTAL, CKMB, CKMBINDEX, TROPONINI in the last 168 hours. BNP (last 3 results) No results for input(s): PROBNP in the last 8760 hours. HbA1C: No results for input(s): HGBA1C in the last 72 hours. CBG: Recent Labs  Lab 11/11/20 1123  GLUCAP 70   Lipid Profile: No results for input(s): CHOL, HDL, LDLCALC, TRIG, CHOLHDL, LDLDIRECT in the last 72  hours. Thyroid Function Tests: No results for input(s): TSH, T4TOTAL, FREET4, T3FREE, THYROIDAB in the last 72 hours. Anemia Panel: No results for input(s): VITAMINB12, FOLATE, FERRITIN, TIBC, IRON, RETICCTPCT in the last 72 hours. Sepsis Labs: Recent Labs  Lab 11/09/20 2317 11/09/20 2318 11/10/20 0132 11/10/20 0632 11/10/20 0927  PROCALCITON 1.10  --   --  1.10  --   LATICACIDVEN  --  4.0* 3.9* 2.8* 2.1*    Recent Results (from the past 240 hour(s))  Urine Culture     Status: Abnormal   Collection Time: 11/09/20 11:12 PM   Specimen: Urine, Random  Result Value Ref Range Status   Specimen Description   Final    URINE, RANDOM Performed at [facility], [address], [place], [place] [address]    Special Requests   Final    NONE Performed at [facility], [address] [place], [place] [address]    Culture >=100,000 COLONIES/mL ENTEROCOCCUS  FAECALIS (A)  Final   Report Status 11/12/2020 FINAL  Final   Organism ID, Bacteria ENTEROCOCCUS FAECALIS (A)  Final      Susceptibility   Enterococcus faecalis - MIC*    AMPICILLIN <=2 SENSITIVE Sensitive     NITROFURANTOIN <=16 SENSITIVE Sensitive     VANCOMYCIN 1 SENSITIVE Sensitive     * >=100,000 COLONIES/mL ENTEROCOCCUS FAECALIS  Resp Panel by RT-PCR (Flu A&B, Covid) Nasopharyngeal Swab     Status: None   Collection Time: 11/10/20  1:47 AM   Specimen: Nasopharyngeal Swab; Nasopharyngeal(NP) swabs in vial transport medium  Result Value Ref Range Status   SARS Coronavirus 2 by RT PCR NEGATIVE NEGATIVE Final    Comment: (NOTE) SARS-CoV-2 target nucleic acids are NOT DETECTED.  The SARS-CoV-2 RNA is generally detectable in upper respiratory specimens during the acute phase of infection. The lowest concentration of SARS-CoV-2 viral copies this assay can detect is 138 copies/mL. A negative result does not preclude SARS-Cov-2 infection and should not be used as the sole basis for treatment or other patient  management decisions. A negative result may occur with  improper specimen collection/handling, submission of specimen other than nasopharyngeal swab, presence of viral mutation(s) within the areas targeted by this assay, and inadequate number of viral copies(<138 copies/mL). A negative result must be combined with clinical observations, patient history, and epidemiological information. The expected result is Negative.  Fact Sheet for Patients:  EntrepreneurPulse.com.au  Fact Sheet for Healthcare Providers:  IncredibleEmployment.be  This test is no t yet approved or cleared by the [place] FDA and  has been authorized for detection and/or diagnosis of SARS-CoV-2 by FDA under an Emergency Use Authorization (EUA). This EUA will remain  in effect (meaning this test can be used) for the duration of the COVID-19 declaration under Section 564(b)(1) of the Act, 21 U.S.C.section 360bbb-3(b)(1), unless the authorization is terminated  or revoked sooner.       Influenza A by PCR NEGATIVE NEGATIVE Final   Influenza B by PCR NEGATIVE NEGATIVE Final    Comment: (NOTE) The Xpert Xpress SARS-CoV-2/FLU/RSV plus assay is intended as an aid in the diagnosis of influenza from Nasopharyngeal swab specimens and should not be used as a sole basis for treatment. Nasal washings and aspirates are unacceptable for Xpert Xpress SARS-CoV-2/FLU/RSV testing.  Fact Sheet for Patients: EntrepreneurPulse.com.au  Fact Sheet for Healthcare Providers: IncredibleEmployment.be  This test is not yet approved or cleared by the [place] FDA and has been authorized for detection and/or diagnosis of SARS-CoV-2 by FDA under an Emergency Use Authorization (EUA). This EUA will remain in effect (meaning this test can be used) for the duration of the COVID-19 declaration under Section 564(b)(1) of the Act, 21 U.S.C. section 360bbb-3(b)(1),  unless the authorization is terminated or revoked.  Performed at [facility], [address] [place], [place] [address]   Blood Culture (routine x 2)     Status: None (Preliminary result)   Collection Time: 11/10/20  6:32 AM   Specimen: BLOOD  Result Value Ref Range Status   Specimen Description BLOOD BLOOD LEFT HAND  Final   Special Requests   Final    BOTTLES DRAWN AEROBIC AND ANAEROBIC Blood Culture adequate volume   Culture   Final    NO GROWTH 2 DAYS Performed at [facility], [address] [place], [place] [address]    Report Status PENDING  Incomplete  Blood Culture (routine x 2)     Status: None (Preliminary result)  Collection Time: 11/10/20  6:32 AM   Specimen: BLOOD  Result Value Ref Range Status   Specimen Description BLOOD BLOOD RIGHT HAND  Final   Special Requests   Final    BOTTLES DRAWN AEROBIC AND ANAEROBIC Blood Culture adequate volume   Culture   Final    NO GROWTH 2 DAYS Performed at [facility], [address], [place], [place] [address]    Report Status PENDING  Incomplete         Radiology Studies: No results found.      Scheduled Meds:  amoxicillin  500 mg Oral Q8H   enoxaparin (LOVENOX) injection  40 mg Subcutaneous Q24H   oxybutynin  5 mg Oral QID   oxyCODONE  20 mg Oral Q12H   Continuous Infusions:   LOS: 2 days    Time spent: 32 minutes    Sharen Hones, MD [facility]   To contact the attending provider between 7A-7P or the covering provider during after hours 7P-7A, please log into the web site [email or web address] and access using universal [place] password for that web site. If you do not have the password, please call the hospital operator.  11/12/2020, 12:14 PM

## 2020-11-12 NOTE — Consult Note (Signed)
Urology consulted to exchange patient's suprapubic catheter, see procedure note below. On physical exam, a small volume of oozing blood noted emanating from the tract. No surrounding erythema or purulence. Patient notes last SPT change by home health approximately 4.5 weeks ago.  Gross hematuria noted with SPT change without passage of clots. Anticipate this should clear on its own, but ok to irrigate as needed if the catheter stops draining due to clot formation.  Suprapubic Cath Change  Patient is present today for a suprapubic catheter change due to urinary retention.  35ml of water was drained from the balloon, a 16FR latex foley cath was removed from the tract without difficulty.  Site was cleaned and prepped in a sterile fashion with betadine.  A 94VO silicone foley cath was replaced into the tract no complications were noted. Urine return was noted, 10 ml of sterile water was inflated into the balloon and a night bag was attached for drainage.  Patient tolerated well. Catheter fixed to the patient's right leg with a StatLock.    Performed by: Debroah Loop, PA-C

## 2020-11-13 DIAGNOSIS — C220 Liver cell carcinoma: Secondary | ICD-10-CM

## 2020-11-13 MED ORDER — OLANZAPINE 5 MG PO TBDP: 5.0000 mg | ORAL_TABLET | Freq: Every day | ORAL | Status: AC

## 2020-11-13 MED ORDER — TRAZODONE HCL 50 MG PO TABS: 25.0000 mg | ORAL_TABLET | Freq: Every evening | ORAL | Status: AC | PRN

## 2020-11-13 MED ORDER — OXYCODONE-ACETAMINOPHEN 5-325 MG PO TABS: 2.0000 | ORAL_TABLET | ORAL | 0 refills | Status: AC | PRN

## 2020-11-13 MED ORDER — MORPHINE SULFATE (CONCENTRATE) 10 MG/0.5ML PO SOLN: 5.0000 mg | ORAL | 0 refills | Status: AC | PRN

## 2020-11-13 MED ORDER — DEXAMETHASONE SODIUM PHOSPHATE 4 MG/ML IJ SOLN: 4.0000 mg | INTRAMUSCULAR | Status: AC

## 2020-11-13 MED ORDER — MORPHINE SULFATE 2 MG/ML IJ SOLN: 2.0000 mg | INTRAMUSCULAR | 0 refills | Status: AC | PRN

## 2020-11-13 MED ORDER — ONDANSETRON HCL 4 MG PO TABS: 4.0000 mg | ORAL_TABLET | Freq: Four times a day (QID) | ORAL | 0 refills | Status: AC | PRN

## 2020-11-13 MED ORDER — FENTANYL 25 MCG/HR TD PT72: 1.0000 | MEDICATED_PATCH | TRANSDERMAL | 0 refills | Status: AC

## 2020-11-13 NOTE — Plan of Care (Signed)

## 2020-11-13 NOTE — Plan of Care (Signed)
  Problem: Education: Goal: Knowledge of General Education information will improve Description: Including pain rating scale, medication(s)/side effects and non-pharmacologic comfort measures 11/13/2020 1709 by Orvan Seen, RN Outcome: Completed/Met 11/13/2020 0921 by Orvan Seen, RN Outcome: Progressing   Problem: Health Behavior/Discharge Planning: Goal: Ability to manage health-related needs will improve 11/13/2020 1709 by Orvan Seen, RN Outcome: Completed/Met 11/13/2020 0921 by Orvan Seen, RN Outcome: Progressing   Problem: Clinical Measurements: Goal: Ability to maintain clinical measurements within normal limits will improve 11/13/2020 1709 by Orvan Seen, RN Outcome: Completed/Met 11/13/2020 0921 by Orvan Seen, RN Outcome: Progressing Goal: Will remain free from infection 11/13/2020 1709 by Orvan Seen, RN Outcome: Completed/Met 11/13/2020 0921 by Orvan Seen, RN Outcome: Progressing Goal: Diagnostic test results will improve 11/13/2020 1709 by Orvan Seen, RN Outcome: Completed/Met 11/13/2020 0921 by Orvan Seen, RN Outcome: Progressing Goal: Respiratory complications will improve 11/13/2020 1709 by Orvan Seen, RN Outcome: Completed/Met 11/13/2020 0921 by Orvan Seen, RN Outcome: Progressing Goal: Cardiovascular complication will be avoided 11/13/2020 1709 by Orvan Seen, RN Outcome: Completed/Met 11/13/2020 0921 by Orvan Seen, RN Outcome: Progressing   Problem: Activity: Goal: Risk for activity intolerance will decrease 11/13/2020 1709 by Orvan Seen, RN Outcome: Completed/Met 11/13/2020 7409 by Orvan Seen, RN Outcome: Progressing   Problem: Nutrition: Goal: Adequate nutrition will be maintained 11/13/2020 1709 by Orvan Seen, RN Outcome: Completed/Met 11/13/2020 0921 by Orvan Seen, RN Outcome: Progressing   Problem: Coping: Goal: Level of anxiety will decrease 11/13/2020  1709 by Orvan Seen, RN Outcome: Completed/Met 11/13/2020 0921 by Orvan Seen, RN Outcome: Progressing   Problem: Elimination: Goal: Will not experience complications related to bowel motility 11/13/2020 1709 by Orvan Seen, RN Outcome: Completed/Met 11/13/2020 0921 by Orvan Seen, RN Outcome: Progressing Goal: Will not experience complications related to urinary retention 11/13/2020 1709 by Orvan Seen, RN Outcome: Completed/Met 11/13/2020 0921 by Orvan Seen, RN Outcome: Progressing   Problem: Pain Managment: Goal: General experience of comfort will improve 11/13/2020 1709 by Orvan Seen, RN Outcome: Completed/Met 11/13/2020 0921 by Orvan Seen, RN Outcome: Progressing   Problem: Safety: Goal: Ability to remain free from injury will improve 11/13/2020 1709 by Orvan Seen, RN Outcome: Completed/Met 11/13/2020 0921 by Orvan Seen, RN Outcome: Progressing   Problem: Skin Integrity: Goal: Risk for impaired skin integrity will decrease 11/13/2020 1709 by Orvan Seen, RN Outcome: Completed/Met 11/13/2020 0921 by Orvan Seen, RN Outcome: Progressing

## 2020-11-13 NOTE — TOC Transition Note (Signed)
Transition of Care [facility]) - CM/SW Discharge Note   Patient Details  Name: Gerald Odonnell MRN: [id number] Date of Birth: Nov 23, 1955  [facility] CM/SW Contact:  Alberteen Sam, LCSW Phone Number: 11/13/2020, 2:23 PM   Clinical Narrative:     CSW informed that patient has bed with [facility], plan to discharge at [facility] has arranged transport via EMS at 5:00 pm with daughter Gerald Odonnell notified.   CSW notes per Venia Carbon with [facility] number for report would be [phone].  Final next level of care: [place] Barriers to Discharge: No Barriers Identified   Patient Goals and CMS Choice Patient states their goals for this hospitalization and ongoing recovery are:: hospice home CMS Medicare.gov Compare Post Acute Care list provided to:: Patient Represenative (must comment) Gerald Odonnell (daughter)) Choice offered to / list presented to : Adult Children  Discharge Placement                Patient to be transferred to facility by: [facility] Name of family member notified: Daughter Gerald Odonnell Patient and family notified of of transfer: 11/13/20  Discharge Plan and Services                                     Social Determinants of Health (SDOH) Interventions     Readmission Risk Interventions No flowsheet data found.

## 2020-11-13 NOTE — Care Management Important Message (Signed)
Important Message  Patient Details  Name: Gerald Odonnell MRN: [id number] Date of Birth: [birth year]   Medicare Important Message Given:  Other (see comment)  Per chart, discharging to hospice home.  Medicare IM withheld at this time.    Dannette Barbara 11/13/2020, 11:16 AM

## 2020-11-13 NOTE — Discharge Summary (Signed)
Valente K Odonnell MWU:[id number] DOB: 09-Jul-1955 DOA: 11/09/2020  PCP: Jonetta Osgood, NP  Admit date: 11/09/2020 Discharge date: 11/13/2020  Time spent: 35 minutes    Discharge Diagnoses:  Active Problems:   Prostate cancer ([place])   Complicated UTI (urinary tract infection)   Liver lesion   Hepatocellular carcinoma ([place])   Palliative care encounter   Discharge Condition: stable  Diet recommendation: regular  Filed Weights   11/09/20 2246  Weight: 88.9 kg    History of present illness:  Gerald Odonnell is a 65 y.o. male with medical history significant for metastatic prostate cancer to the bone followed by Dr. Grayland Ormond, with history of colon cancer, nephrolithiasis, hypertension on chronic suprapubic catheter, who presented to the emergency room with acute onset of lower abdominal pain mainly in the suprapubic area with associated nausea and dry heaves without diarrhea.  He stated that he has no appetite.  He is not eating a sandwich and couple weeks.  He denied any fever however has been diaphoretic.  His urine has been dark and occasionally reddish.  No chest pain or palpitations.  No cough or wheezing or hemoptysis.  No other bleeding diathesis.  Hospital Course:  Hx of metastatic prostate cancer, admitted for worsening functional status. Found to have liver failure 2/2 HCC. Transitioned to comfort care 7/19. Discharged to residential hospice.  Procedures: Exchange of suprapubic catheter   Consultations: Oncology, urology  Discharge Exam: Vitals:   11/13/20 0426 11/13/20 0838  BP: 124/75 132/73  Pulse: (!) 109 (!) 110  Resp: 20 17  Temp: (!) 97.2 F (36.2 C)   SpO2: 97% 97%    General: pale, chronically ill appearing Cardiovascular: RRR, soft systolic murmur Respiratory: CTAB save for faint rales at bases  Discharge Instructions   Discharge Instructions     Diet - low sodium heart healthy   Complete by: As directed    Increase activity slowly   Complete  by: As directed       Allergies as of 11/13/2020   No Known Allergies      Medication List     STOP taking these medications    abiraterone acetate 250 MG tablet Commonly known as: ZYTIGA   acetaminophen 325 MG tablet Commonly known as: TYLENOL   diltiazem 240 MG 24 hr capsule Commonly known as: CARDIZEM CD   docusate sodium 100 MG capsule Commonly known as: COLACE   furosemide 20 MG tablet Commonly known as: LASIX   metoprolol tartrate 25 MG tablet Commonly known as: LOPRESSOR   oxybutynin 5 MG tablet Commonly known as: DITROPAN   potassium chloride 10 MEQ tablet Commonly known as: KLOR-CON   predniSONE 5 MG tablet Commonly known as: DELTASONE   senna 8.6 MG Tabs tablet Commonly known as: SENOKOT   Vitamin D 125 MCG (5000 UT) Caps       TAKE these medications    dexamethasone 4 MG/ML injection Commonly known as: DECADRON Inject 1 mL (4 mg total) into the vein daily.   fentaNYL 25 MCG/HR Commonly known as: Flasher 1 patch onto the skin every 3 (three) days. Start taking on: November 15, 2020   morphine 2 MG/ML injection Inject 1-2 mLs (2-4 mg total) into the vein every 2 (two) hours as needed.   morphine CONCENTRATE 10 MG/0.5ML Soln concentrated solution Take 0.25-0.5 mLs (5-10 mg total) by mouth every 2 (two) hours as needed for moderate pain or shortness of breath.   OLANZapine zydis 5 MG disintegrating tablet Commonly known as:  ZYPREXA Take 1 tablet (5 mg total) by mouth daily. Start taking on: November 14, 2020   ondansetron 4 MG tablet Commonly known as: ZOFRAN Take 1 tablet (4 mg total) by mouth every 6 (six) hours as needed for nausea.   oxyCODONE-acetaminophen 5-325 MG tablet Commonly known as: PERCOCET/ROXICET Take 2 tablets by mouth every 4 (four) hours as needed for moderate pain.   traZODone 50 MG tablet Commonly known as: DESYREL Take 0.5 tablets (25 mg total) by mouth at bedtime as needed for sleep.       No Known  Allergies    The results of significant diagnostics from this hospitalization (including imaging, microbiology, ancillary and laboratory) are listed below for reference.    Significant Diagnostic Studies: CT ABDOMEN PELVIS W CONTRAST  Result Date: 11/10/2020 CLINICAL DATA:  Lower abdominal pain, nausea, vomiting. Metastatic prostate cancer. EXAM: CT ABDOMEN AND PELVIS WITH CONTRAST TECHNIQUE: Multidetector CT imaging of the abdomen and pelvis was performed using the standard protocol following bolus administration of intravenous contrast. CONTRAST:  114mL OMNIPAQUE IOHEXOL 350 MG/ML SOLN COMPARISON:  None. FINDINGS: Lower chest: Stable mild elevation of the left hemidiaphragm. The visualized lung bases are clear. Moderate coronary artery calcification. Hepatobiliary: The liver contour is nodular and there is hypertrophy of the a left hepatic lobe and caudate lobe in keeping with changes of underlying cirrhosis. Since the prior examination, there has developed a markedly heterogeneous enhancement pattern within the right hepatic lobe with multiple hypoattenuating intrahepatic mass is identified. While predominantly located within the right hepatic lobe, several hypoenhancing masses are also noted within the left hepatic lobe. The dominant mass is seen within segment 5 of the liver and appears to demonstrate confluent, expansile tumor thrombus within the a portal venous system extending into the a main portal vein just proximal to the portosplenic confluence. Altogether, this is most in keeping with multifocal hepatocellular carcinoma with associated portal venous extension. Exact measurement of the primary mass is difficult to delineate on this examination. An index lesion, however, within the inferior right hepatic lobe measures 3.7 x 5.0 cm at axial image # 50/2. No intra or extrahepatic biliary ductal dilation. Gallbladder unremarkable. Pancreas: Unremarkable Spleen: The spleen is enlarged, measuring 14.4  cm in greatest dimension, similar to that noted on prior examination. Adrenals/Urinary Tract: Bilateral adrenal nodules are unchanged measuring 20 mm on the right and 13 mm on the left. These are not well characterized on this examination. The kidneys are unremarkable. Multiple calculi are seen laying dependently within the bladder lumen, progressive since prior examination with the dominant calculus measuring 11 x 28 mm a suprapubic catheter seen within a decompressed bladder lumen. Stomach/Bowel: Distal colectomy has been performed with an anastomotic staple line seen within the splenic flexure. The stomach, small bowel, and large bowel are otherwise unremarkable. Appendix normal. Interval development of mild ascites. No free intraperitoneal gas. Vascular/Lymphatic: Moderate aortoiliac atherosclerotic calcification. No aortic aneurysm. Small gastroesophageal varices identified. Multiple shotty lymph nodes are again identified within the porta hepatis, stable since prior examination, nonspecific. Index lymph node measures 13 mm in short axis diameter at axial image # 40/2. No additional pathologic adenopathy within the abdomen and pelvis. Reproductive: Brachytherapy seeds noted within the prostate gland. Other: Small fat containing inguinal hernias again noted. Musculoskeletal: Numerable sclerotic metastases are again seen throughout the visualized axial skeleton in keeping with the patient's known underlying metastatic prostate cancer. No pathologic fracture identified. IMPRESSION: Interval development of innumerable hepatic hypoenhancing masses with thrombosis of the main, right,  and left portal veins most in keeping with multifocal hepatocellular carcinoma with portal venous extension/tumor thrombus. Tumor thrombus extends into the proximal portal vein just distal to the portal splenic confluence. Background changes of cirrhosis. Interval development of mild ascites likely reflects changes of portal venous  hypertension. Correlation with serum AFP levels would be helpful in confirming this. Contrast enhanced MRI examination would also be helpful in further evaluating this finding. Stable splenomegaly. Stable adrenal nodules, not optimally characterized on this examination. Progressive calculi within the bladder lumen. Decompression of the bladder with suprapubic catheter in appropriate position. Grossly stable innumerable sclerotic metastases throughout the visualized axial skeleton in keeping with the patient's known metastatic prostate cancer. Aortic Atherosclerosis (ICD10-I70.0). Electronically Signed   By: Fidela Salisbury MD   On: 11/10/2020 01:04   DG Chest Port 1 View  Result Date: 11/09/2020 CLINICAL DATA:  Lower abdominal pain EXAM: PORTABLE CHEST 1 VIEW COMPARISON:  10/12/2020, 08/17/2016 FINDINGS: Chronic elevation left diaphragm with pleural scarring. No focal opacity or pleural effusion. Stable cardiomediastinal silhouette. No pneumothorax. Sclerotic bony metastatic disease redemonstrated. IMPRESSION: No active disease.  Sclerotic bony metastatic disease. Electronically Signed   By: Donavan Foil M.D.   On: 11/09/2020 23:57    Microbiology: Recent Results (from the past 240 hour(s))  Urine Culture     Status: Abnormal   Collection Time: 11/09/20 11:12 PM   Specimen: Urine, Random  Result Value Ref Range Status   Specimen Description   Final    URINE, RANDOM Performed at [facility], [address] [place], [place] [address]    Special Requests   Final    NONE Performed at [facility], [address] [place],  [address]    Culture >=100,000 COLONIES/mL ENTEROCOCCUS FAECALIS (A)  Final   Report Status 11/12/2020 FINAL  Final   Organism ID, Bacteria ENTEROCOCCUS FAECALIS (A)  Final      Susceptibility   Enterococcus faecalis - MIC*    AMPICILLIN <=2 SENSITIVE Sensitive     NITROFURANTOIN <=16 SENSITIVE Sensitive     VANCOMYCIN 1 SENSITIVE Sensitive      * >=100,000 COLONIES/mL ENTEROCOCCUS FAECALIS  Resp Panel by RT-PCR (Flu A&B, Covid) Nasopharyngeal Swab     Status: None   Collection Time: 11/10/20  1:47 AM   Specimen: Nasopharyngeal Swab; Nasopharyngeal(NP) swabs in vial transport medium  Result Value Ref Range Status   SARS Coronavirus 2 by RT PCR NEGATIVE NEGATIVE Final    Comment: (NOTE) SARS-CoV-2 target nucleic acids are NOT DETECTED.  The SARS-CoV-2 RNA is generally detectable in upper respiratory specimens during the acute phase of infection. The lowest concentration of SARS-CoV-2 viral copies this assay can detect is 138 copies/mL. A negative result does not preclude SARS-Cov-2 infection and should not be used as the sole basis for treatment or other patient management decisions. A negative result may occur with  improper specimen collection/handling, submission of specimen other than nasopharyngeal swab, presence of viral mutation(s) within the areas targeted by this assay, and inadequate number of viral copies(<138 copies/mL). A negative result must be combined with clinical observations, patient history, and epidemiological information. The expected result is Negative.  Fact Sheet for Patients:  EntrepreneurPulse.com.au  Fact Sheet for Healthcare Providers:  IncredibleEmployment.be  This test is no t yet approved or cleared by the [place] FDA and  has been authorized for detection and/or diagnosis of SARS-CoV-2 by FDA under an Emergency Use Authorization (EUA). This EUA will remain  in effect (meaning this test  can be used) for the duration of the COVID-19 declaration under Section 564(b)(1) of the Act, 21 U.S.C.section 360bbb-3(b)(1), unless the authorization is terminated  or revoked sooner.       Influenza A by PCR NEGATIVE NEGATIVE Final   Influenza B by PCR NEGATIVE NEGATIVE Final    Comment: (NOTE) The Xpert Xpress SARS-CoV-2/FLU/RSV plus assay is intended as an  aid in the diagnosis of influenza from Nasopharyngeal swab specimens and should not be used as a sole basis for treatment. Nasal washings and aspirates are unacceptable for Xpert Xpress SARS-CoV-2/FLU/RSV testing.  Fact Sheet for Patients: EntrepreneurPulse.com.au  Fact Sheet for Healthcare Providers: IncredibleEmployment.be  This test is not yet approved or cleared by the [place] FDA and has been authorized for detection and/or diagnosis of SARS-CoV-2 by FDA under an Emergency Use Authorization (EUA). This EUA will remain in effect (meaning this test can be used) for the duration of the COVID-19 declaration under Section 564(b)(1) of the Act, 21 U.S.C. section 360bbb-3(b)(1), unless the authorization is terminated or revoked.  Performed at [facility], [address] [place], [place] [address]   Blood Culture (routine x 2)     Status: None (Preliminary result)   Collection Time: 11/10/20  6:32 AM   Specimen: BLOOD  Result Value Ref Range Status   Specimen Description BLOOD BLOOD LEFT HAND  Final   Special Requests   Final    BOTTLES DRAWN AEROBIC AND ANAEROBIC Blood Culture adequate volume   Culture   Final    NO GROWTH 3 DAYS Performed at [facility], [address], [place], [place] [address]    Report Status PENDING  Incomplete  Blood Culture (routine x 2)     Status: None (Preliminary result)   Collection Time: 11/10/20  6:32 AM   Specimen: BLOOD  Result Value Ref Range Status   Specimen Description BLOOD BLOOD RIGHT HAND  Final   Special Requests   Final    BOTTLES DRAWN AEROBIC AND ANAEROBIC Blood Culture adequate volume   Culture   Final    NO GROWTH 3 DAYS Performed at [facility], [place], [place] [address]    Report Status PENDING  Incomplete     Labs: Basic Metabolic Panel: Recent Labs  Lab 11/09/20 2217 11/10/20 0632 11/11/20 0522  NA 133* 134* 134*  K 3.8  2.8* 4.0  CL 96* 97* 102  CO2 24 26 24   GLUCOSE 82 69* 63*  BUN 9 7* 8  CREATININE 0.89 0.63 0.61  CALCIUM 9.1 9.1 8.9  MG  --   --  1.8   Liver Function Tests: Recent Labs  Lab 11/09/20 2217  AST 203*  ALT 36  ALKPHOS 177*  BILITOT 6.7*  PROT 6.3*  ALBUMIN 2.7*   Recent Labs  Lab 11/09/20 2217  LIPASE 21   No results for input(s): AMMONIA in the last 168 hours. CBC: Recent Labs  Lab 11/09/20 2217 11/10/20 0632 11/11/20 0522  WBC 9.3 6.8 6.7  NEUTROABS  --   --  5.4  HGB 12.3* 12.2* 11.5*  HCT 36.4* 35.1* 35.1*  MCV 107.7* 107.0* 108.7*  PLT 216 194 187   Cardiac Enzymes: No results for input(s): CKTOTAL, CKMB, CKMBINDEX, TROPONINI in the last 168 hours. BNP: BNP (last 3 results) Recent Labs    10/03/20 0805  BNP 147.1*    ProBNP (last 3 results) No results for input(s): PROBNP in the last 8760 hours.  CBG: Recent Labs  Lab 11/11/20 1123  GLUCAP 70       Signed:  Desma Maxim MD.  [facility] 11/13/2020, 11:48 AM

## 2020-11-13 NOTE — Progress Notes (Signed)
Patient is refusing all medications this morning.

## 2020-11-13 NOTE — Progress Notes (Signed)
Report given to Sharlyne Pacas at 1355.  All questions answered. Patient transferred to [facility] with all belongings via EMS.  All care transferred.

## 2020-11-13 NOTE — Progress Notes (Signed)
Manufacturing engineer [facility])  [facility]osp[facility] has a bed for Mr. [place] today.  Necessary consents have to be completed prior to transport occurring.  Once these forms are completed, [facility] will update TOC to facilitate transportation.  RN staff, please call report to [phone], room is assigned when report is called.  Please leave IV access in place.  Thank you, Venia Carbon RN, BSN, [facility] Liaison

## 2020-11-15 LAB — CULTURE, BLOOD (ROUTINE X 2)
Culture: NO GROWTH
Culture: NO GROWTH
Special Requests: ADEQUATE
Special Requests: ADEQUATE

## 2020-11-25 DEATH — deceased

## 2020-11-26 ENCOUNTER — Other Ambulatory Visit: Payer: Medicare HMO

## 2020-11-26 ENCOUNTER — Ambulatory Visit: Payer: Medicare HMO | Admitting: Oncology

## 2020-12-10 ENCOUNTER — Encounter: Payer: Self-pay | Admitting: Internal Medicine

## 2020-12-12 ENCOUNTER — Ambulatory Visit: Payer: Medicare HMO | Admitting: Nurse Practitioner

## 2020-12-16 ENCOUNTER — Other Ambulatory Visit (HOSPITAL_COMMUNITY): Payer: Self-pay

## 2020-12-18 ENCOUNTER — Telehealth: Payer: Self-pay

## 2020-12-18 NOTE — Telephone Encounter (Signed)
Orders signed and faxed back to [facility] [phone]. Sent to be scanned-Toni

## 2021-01-14 ENCOUNTER — Telehealth: Payer: Self-pay

## 2021-01-14 NOTE — Telephone Encounter (Signed)
Plan of care order signed and faxed back to [facility] [phone]

## 2021-01-21 ENCOUNTER — Ambulatory Visit: Payer: Medicare HMO | Admitting: Nurse Practitioner
